# Patient Record
Sex: Male | Born: 1935
Health system: Southern US, Community
[De-identification: ages and names within clinical notes are randomized; demographics above are authoritative.]

## PROBLEM LIST (undated history)

## (undated) DIAGNOSIS — E119 Type 2 diabetes mellitus without complications: Secondary | ICD-10-CM

## (undated) DIAGNOSIS — I10 Essential (primary) hypertension: Secondary | ICD-10-CM

## (undated) DIAGNOSIS — N183 Chronic kidney disease, stage 3 unspecified: Secondary | ICD-10-CM

## (undated) DIAGNOSIS — R932 Abnormal findings on diagnostic imaging of liver and biliary tract: Secondary | ICD-10-CM

## (undated) DIAGNOSIS — I4891 Unspecified atrial fibrillation: Secondary | ICD-10-CM

## (undated) DIAGNOSIS — M8580 Other specified disorders of bone density and structure, unspecified site: Secondary | ICD-10-CM

## (undated) DIAGNOSIS — Z5189 Encounter for other specified aftercare: Secondary | ICD-10-CM

## (undated) DIAGNOSIS — I482 Chronic atrial fibrillation, unspecified: Secondary | ICD-10-CM

## (undated) DIAGNOSIS — K626 Ulcer of anus and rectum: Secondary | ICD-10-CM

## (undated) DIAGNOSIS — D649 Anemia, unspecified: Secondary | ICD-10-CM

## (undated) DIAGNOSIS — E785 Hyperlipidemia, unspecified: Secondary | ICD-10-CM

## (undated) DIAGNOSIS — K5909 Other constipation: Secondary | ICD-10-CM

## (undated) DIAGNOSIS — M109 Gout, unspecified: Secondary | ICD-10-CM

## (undated) DIAGNOSIS — I351 Nonrheumatic aortic (valve) insufficiency: Secondary | ICD-10-CM

## (undated) DIAGNOSIS — I5032 Chronic diastolic (congestive) heart failure: Secondary | ICD-10-CM

## (undated) DIAGNOSIS — N182 Chronic kidney disease, stage 2 (mild): Secondary | ICD-10-CM

## (undated) DIAGNOSIS — I872 Venous insufficiency (chronic) (peripheral): Secondary | ICD-10-CM

## (undated) DIAGNOSIS — E538 Deficiency of other specified B group vitamins: Secondary | ICD-10-CM

## (undated) DIAGNOSIS — I83893 Varicose veins of bilateral lower extremities with other complications: Secondary | ICD-10-CM

## (undated) HISTORY — DX: Other constipation: K59.09

## (undated) HISTORY — DX: Anemia, unspecified: D64.9

## (undated) HISTORY — DX: Encounter for other specified aftercare: Z51.89

## (undated) HISTORY — DX: Venous insufficiency (chronic) (peripheral): I87.2

## (undated) HISTORY — DX: Gout, unspecified: M10.9

## (undated) HISTORY — DX: Other specified disorders of bone density and structure, unspecified site: M85.80

## (undated) HISTORY — DX: Ulcer of anus and rectum: K62.6

## (undated) HISTORY — DX: Essential (primary) hypertension: I10

## (undated) HISTORY — DX: Type 2 diabetes mellitus without complications: E11.9

## (undated) HISTORY — DX: Deficiency of other specified B group vitamins: E53.8

## (undated) HISTORY — DX: Hyperlipidemia, unspecified: E78.5

## (undated) HISTORY — DX: Varicose veins of bilateral lower extremities with other complications: I83.893

## (undated) HISTORY — DX: Chronic kidney disease, stage 2 (mild): N18.2

## (undated) HISTORY — DX: Unspecified atrial fibrillation: I48.91

---
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
## 1968-01-03 HISTORY — PX: HEMORRHOID SURGERY: SHX153

## 2003-12-29 ENCOUNTER — Ambulatory Visit: Payer: Self-pay | Admitting: Family Medicine

## 2004-01-03 HISTORY — PX: BUNIONECTOMY: SHX129

## 2004-02-02 ENCOUNTER — Ambulatory Visit: Payer: Self-pay | Admitting: Family Medicine

## 2004-04-20 ENCOUNTER — Ambulatory Visit: Payer: Self-pay | Admitting: Family Medicine

## 2004-05-20 ENCOUNTER — Ambulatory Visit: Payer: Self-pay | Admitting: Family Medicine

## 2004-10-05 ENCOUNTER — Ambulatory Visit: Payer: Self-pay | Admitting: Family Medicine

## 2004-10-17 ENCOUNTER — Ambulatory Visit: Payer: Self-pay | Admitting: Family Medicine

## 2004-12-14 ENCOUNTER — Ambulatory Visit: Payer: Self-pay | Admitting: Family Medicine

## 2005-01-16 ENCOUNTER — Ambulatory Visit: Payer: Self-pay | Admitting: Family Medicine

## 2005-02-13 ENCOUNTER — Ambulatory Visit: Payer: Self-pay | Admitting: Family Medicine

## 2005-03-17 ENCOUNTER — Ambulatory Visit: Payer: Self-pay | Admitting: Family Medicine

## 2005-06-07 ENCOUNTER — Ambulatory Visit: Payer: Self-pay | Admitting: Family Medicine

## 2005-07-10 ENCOUNTER — Ambulatory Visit: Payer: Self-pay | Admitting: Family Medicine

## 2006-02-06 ENCOUNTER — Ambulatory Visit: Payer: Self-pay | Admitting: Family Medicine

## 2006-02-06 LAB — CONVERTED CEMR LAB
Basophils Absolute: 0 10*3/uL (ref 0.0–0.1)
Basophils Relative: 0.8 % (ref 0.0–1.0)
Cholesterol: 121 mg/dL (ref 0–200)
Creatinine, Ser: 1.2 mg/dL (ref 0.4–1.5)
Creatinine,U: 303.5 mg/dL
Eosinophils Relative: 2.8 % (ref 0.0–5.0)
Glucose, Bld: 106 mg/dL — ABNORMAL HIGH (ref 70–99)
HCT: 32.2 % — ABNORMAL LOW (ref 39.0–52.0)
HDL: 32.6 mg/dL — ABNORMAL LOW (ref >39.0)
Hemoglobin: 10.6 g/dL — ABNORMAL LOW (ref 13.0–17.0)
LDL Cholesterol: 67 mg/dL (ref 0–99)
Lymphocytes Relative: 22.2 % (ref 12.0–46.0)
Microalb Creat Ratio: 2.3 mg/g (ref 0.0–30.0)
Microalb, Ur: 0.7 mg/dL (ref 0.0–1.9)
Monocytes Absolute: 0.7 10*3/uL (ref 0.2–0.7)
Neutro Abs: 3.8 10*3/uL (ref 1.4–7.7)
Neutrophils Relative %: 63.3 % (ref 43.0–77.0)
PSA: 2.34 ng/mL (ref 0.10–4.00)
Potassium: 4.6 meq/L (ref 3.5–5.1)
RDW: 13.8 % (ref 11.5–14.6)
VLDL: 21 mg/dL (ref 0–40)

## 2006-03-20 ENCOUNTER — Ambulatory Visit: Payer: Self-pay | Admitting: Family Medicine

## 2006-05-14 ENCOUNTER — Ambulatory Visit: Payer: Self-pay | Admitting: Family Medicine

## 2006-05-14 LAB — CONVERTED CEMR LAB
Basophils Absolute: 0 10*3/uL (ref 0.0–0.1)
Eosinophils Absolute: 0.1 10*3/uL (ref 0.0–0.6)
Hgb A1c MFr Bld: 6.3 % — ABNORMAL HIGH (ref 4.6–6.0)
MCHC: 34.3 g/dL (ref 30.0–36.0)
MCV: 81.4 fL (ref 78.0–100.0)
Monocytes Absolute: 0.6 10*3/uL (ref 0.2–0.7)
Monocytes Relative: 10.4 % (ref 3.0–11.0)
RBC: 5.09 M/uL (ref 4.22–5.81)
RDW: 17.7 % — ABNORMAL HIGH (ref 11.5–14.6)

## 2006-08-13 DIAGNOSIS — E785 Hyperlipidemia, unspecified: Secondary | ICD-10-CM | POA: Insufficient documentation

## 2006-08-13 DIAGNOSIS — I482 Chronic atrial fibrillation, unspecified: Secondary | ICD-10-CM | POA: Insufficient documentation

## 2006-08-13 DIAGNOSIS — E8881 Metabolic syndrome: Secondary | ICD-10-CM | POA: Insufficient documentation

## 2006-08-13 DIAGNOSIS — M109 Gout, unspecified: Secondary | ICD-10-CM | POA: Insufficient documentation

## 2006-08-13 DIAGNOSIS — I4891 Unspecified atrial fibrillation: Secondary | ICD-10-CM

## 2006-08-14 ENCOUNTER — Ambulatory Visit: Payer: Self-pay | Admitting: Family Medicine

## 2006-08-14 LAB — CONVERTED CEMR LAB
INR: 1.9
Prothrombin Time: 17 s

## 2006-09-25 ENCOUNTER — Ambulatory Visit: Payer: Self-pay | Admitting: Family Medicine

## 2007-01-30 ENCOUNTER — Telehealth: Payer: Self-pay | Admitting: Family Medicine

## 2007-11-12 DIAGNOSIS — R221 Localized swelling, mass and lump, neck: Secondary | ICD-10-CM | POA: Insufficient documentation

## 2007-11-12 DIAGNOSIS — R220 Localized swelling, mass and lump, head: Secondary | ICD-10-CM | POA: Insufficient documentation

## 2008-01-16 DIAGNOSIS — T753XXA Motion sickness, initial encounter: Secondary | ICD-10-CM | POA: Insufficient documentation

## 2008-02-19 DIAGNOSIS — K649 Unspecified hemorrhoids: Secondary | ICD-10-CM | POA: Insufficient documentation

## 2010-05-20 NOTE — Assessment & Plan Note (Signed)
[facility]                                 ON-CALL NOTE   JAYCUB, NOORANI                      MRN:          [id number]  DATE:02/17/2006                            DOB:          September 26, 1931    PHONE:  [phone].   SUBJECTIVE:  Exposure to flu.  A child stayed with them last night and  then saw a doctor this morning and was diagnosed with the flu.  He is  requesting Tamiflu prophylaxis.   ASSESSMENT AND PLAN:  Tamiflu called in 75 mg 1 tablet p.o. b.i.d. x10  to [facility] in [place] for husband and wife.     Kerby Nora, MD  Electronically Signed    AB/MedQ  DD: 02/18/2006  DT: 02/18/2006  Job #: [id number]

## 2013-08-26 ENCOUNTER — Ambulatory Visit (INDEPENDENT_AMBULATORY_CARE_PROVIDER_SITE_OTHER): Payer: Medicare Other | Admitting: Family Medicine

## 2013-08-26 ENCOUNTER — Encounter: Payer: Self-pay | Admitting: Family Medicine

## 2013-08-26 VITALS — BP 114/75 | HR 74 | Temp 98.2°F | Resp 18 | Ht 71.0 in | Wt 260.0 lb

## 2013-08-26 DIAGNOSIS — I4891 Unspecified atrial fibrillation: Secondary | ICD-10-CM

## 2013-08-26 DIAGNOSIS — M109 Gout, unspecified: Secondary | ICD-10-CM

## 2013-08-26 DIAGNOSIS — R7303 Prediabetes: Secondary | ICD-10-CM | POA: Insufficient documentation

## 2013-08-26 DIAGNOSIS — D500 Iron deficiency anemia secondary to blood loss (chronic): Secondary | ICD-10-CM

## 2013-08-26 DIAGNOSIS — E119 Type 2 diabetes mellitus without complications: Secondary | ICD-10-CM

## 2013-08-26 DIAGNOSIS — I482 Chronic atrial fibrillation, unspecified: Secondary | ICD-10-CM

## 2013-08-26 DIAGNOSIS — D649 Anemia, unspecified: Secondary | ICD-10-CM | POA: Insufficient documentation

## 2013-08-26 DIAGNOSIS — E785 Hyperlipidemia, unspecified: Secondary | ICD-10-CM

## 2013-08-26 NOTE — Assessment & Plan Note (Signed)
He is on minimal dose of metformin. Continue this.

## 2013-08-26 NOTE — Assessment & Plan Note (Signed)
Per pt's report, this has been quiescent on allopurinol for quite some time.

## 2013-08-26 NOTE — Assessment & Plan Note (Signed)
Stable with rate control and anticoag. We'll continue the q6 wk PT/INR monitoring he has been used to, with the next one being due in about 5 wks.

## 2013-08-26 NOTE — Progress Notes (Signed)
Office Note 08/26/2013  CC:  Chief Complaint  Patient presents with  . Establish Care    HPI:  [name] is a 80 y.o. White male who is here to establish care. Patient's most recent primary MD: Dr. Hermenia Bers in [place]. Old records in EPIC/HL EMR were reviewed prior to or during today's visit.  He has DM, hx of good control, does not monitor home CBGs.  Has been getting q3 mo lab checks/office visits with PMD. Has been getting INRs q6 wks.  After mowing lawn and doing other yard work for 4-5 hours recently he had onset of some painful varicose veins in back of left thigh.  This has gradually been getting better.  No new leg swelling.  He has mild chronic venous insufficiency edema. No fever or malaise.  Past Medical History  Diagnosis Date  . Anemia   . Diabetes mellitus without complication     hx of good control  . Blood transfusion without reported diagnosis   . Hyperlipidemia   . Hypertension   . Gout   . Vitamin B12 deficiency   . Atrial fibrillation   . GI bleed     slow/occult: pan-endoscopy + capsule endoscopy showed no site of bleeding.  Takes one iron tab a day and Hb has been stable.  . Chronic constipation     colace helps  . Chronic venous insufficiency     Past Surgical History  Procedure Laterality Date  . Tonsillectomy and adenoidectomy  1945  . Hemorrhoid surgery  1970  . Bunionectomy  2006    Family History  Problem Relation Age of Onset  . Alcohol abuse Father   . Diabetes Father   . Obesity Brother     History   Social History  . Marital Status: Married    Spouse Name: N/A    Number of Children: N/A  . Years of Education: N/A   Occupational History  . Not on file.   Social History Main Topics  . Smoking status: Former Smoker    Types: Cigarettes  . Smokeless tobacco: Never Used  . Alcohol Use: No  . Drug Use: No  . Sexual Activity: Not on file   Other Topics Concern  . Not on file   Social History Narrative   Married, 3  daughters, 4 grandchildren.      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   Retired 2012.      Remote smoking hx: quit 55 yrs ago.  No alcohol in 55 yrs.          Outpatient Encounter Prescriptions as of 08/26/2013  Medication Sig  . allopurinol (ZYLOPRIM) 300 MG tablet Take 300 mg by mouth daily.  . cyanocobalamin (,VITAMIN B-12,) 1000 MCG/ML injection Inject 1,000 mcg into the muscle every 30 (thirty) days.  Mariane Baumgarten Sodium 100 MG capsule   . ferrous sulfate 325 (65 FE) MG tablet Take 325 mg by mouth daily with breakfast.  . lisinopril (PRINIVIL,ZESTRIL) 2.5 MG tablet Take 2.5 mg by mouth daily.  . metFORMIN (GLUCOPHAGE) 500 MG tablet Take 500 mg by mouth daily.  . metoprolol succinate (TOPROL-XL) 50 MG 24 hr tablet Take 50 mg by mouth daily.  . simvastatin (ZOCOR) 20 MG tablet Take 20 mg by mouth daily.  Marland Kitchen warfarin (COUMADIN) 5 MG tablet Take 5 mg by mouth daily.    No Active Allergies  ROS Review of Systems  Constitutional: Negative for fever and  fatigue.  HENT: Negative for congestion and sore throat.   Eyes: Negative for visual disturbance.  Respiratory: Negative for cough.   Cardiovascular: Negative for chest pain.  Gastrointestinal: Negative for nausea and abdominal pain.  Genitourinary: Negative for dysuria.  Musculoskeletal: Negative for back pain and joint swelling.  Skin: Negative for rash.  Neurological: Negative for weakness and headaches.  Hematological: Negative for adenopathy.    PE; Blood pressure 114/75, pulse 74, temperature 98.2 F (36.8 C), temperature source Temporal, resp. rate 18, height 5\' 11"  (1.803 m), weight 260 lb (117.935 kg), SpO2 97.00%. Gen: Alert, well appearing.  Patient is oriented to person, place, time, and situation. CV: Regular but with occ pause, also occasional premature beat. Chest is clear, no wheezing or rales. Normal symmetric air entry throughout both lung fields.  No chest wall deformities or tenderness. EXT: left thigh with some palpable, mildly tender but non-inflamed varicosities over distal hamstring region.  No erythema.  No knee swelling.  He has 2+ pitting edema in both LL's.  Pertinent labs:  None today  ASSESSMENT AND PLAN:   New pt: obtain old records.  ATRIAL FIBRILLATION Stable with rate control and anticoag. We'll continue the q6 wk PT/INR monitoring he has been used to, with the next one being due in about 5 wks.    HYPERLIPIDEMIA Tolerating statin. At next f/u will check FLP, CMET.  GOUT Per pt's report, this has been quiescent on allopurinol for quite some time.  Type II or unspecified type diabetes mellitus without mention of complication, not stated as uncontrolled He is on minimal dose of metformin. Continue this.  Iron deficiency anemia secondary to blood loss (chronic) No site of blood loss could be identified on testing. Currently Hb stable on one iron tab a day per pt report. Will verify with old records.  Plan is to recheck labs in 36mo at next f/u, earlier if he begins to feel lightheaded/orthostatic sx's, which he says was his first hint at low blood count in the past.   An After Visit Summary was printed and given to the patient.  Return in about 6 months (around 02/26/2014) for routine chronic illness f/u with fasting labs the week prior.  Also, needs PT/INR in 5 wks (lab visi.

## 2013-08-26 NOTE — Assessment & Plan Note (Signed)
Tolerating statin. At next f/u will check FLP, CMET.

## 2013-08-26 NOTE — Progress Notes (Signed)
Pre visit review using our clinic review tool, if applicable. No additional management support is needed unless otherwise documented below in the visit note. 

## 2013-08-26 NOTE — Assessment & Plan Note (Signed)
No site of blood loss could be identified on testing. Currently Hb stable on one iron tab a day per pt report. Will verify with old records.  Plan is to recheck labs in 51mo at next f/u, earlier if he begins to feel lightheaded/orthostatic sx's, which he says was his first hint at low blood count in the past.

## 2013-09-02 ENCOUNTER — Encounter: Payer: Self-pay | Admitting: Family Medicine

## 2013-09-02 ENCOUNTER — Ambulatory Visit (INDEPENDENT_AMBULATORY_CARE_PROVIDER_SITE_OTHER): Payer: Medicare Other | Admitting: Family Medicine

## 2013-09-02 VITALS — BP 117/75 | HR 85 | Temp 97.8°F | Resp 16 | Ht 71.0 in | Wt 259.0 lb

## 2013-09-02 DIAGNOSIS — Z5181 Encounter for therapeutic drug level monitoring: Secondary | ICD-10-CM

## 2013-09-02 DIAGNOSIS — Z7901 Long term (current) use of anticoagulants: Secondary | ICD-10-CM

## 2013-09-02 DIAGNOSIS — I83893 Varicose veins of bilateral lower extremities with other complications: Secondary | ICD-10-CM

## 2013-09-02 MED ORDER — LISINOPRIL 2.5 MG PO TABS: 2.5000 mg | ORAL_TABLET | Freq: Every day | ORAL | Status: DC

## 2013-09-02 NOTE — Patient Instructions (Signed)
Apply ice to back of left thigh for 20-30 minutes twice per day. Do your usual daily activities but no exertion or exercise for 2 weeks.

## 2013-09-02 NOTE — Progress Notes (Signed)
Pre visit review using our clinic review tool, if applicable. No additional management support is needed unless otherwise documented below in the visit note. 

## 2013-09-02 NOTE — Progress Notes (Signed)
OFFICE NOTE  09/02/2013  CC:  Chief Complaint  Patient presents with  . Leg Pain    Left leg   . Varicose Veins    back of left leg   HPI: Patient is a 80 y.o. Caucasian male who is here for pain in back of left leg.   Hx of painful varicosity/superficial phlebitis on back of distal portion of upper leg on left (see my note from 08/26/13). Recently the area stopped being painful and a large bruise developed in the area and has drifted down with gravity. No pain now but fleeting mild pain seems to come and go with different positions of his leg.  Has been applying ice daily.  Has hx of good PT/INR levels per his report, no signif need for dose changes in recent past.  Pertinent PMH:  Past medical, surgical, social, and family history reviewed and no changes are noted since last office visit.  MEDS:  Outpatient Prescriptions Prior to Visit  Medication Sig Dispense Refill  . allopurinol (ZYLOPRIM) 300 MG tablet Take 300 mg by mouth daily.      . cyanocobalamin (,VITAMIN B-12,) 1000 MCG/ML injection Inject 1,000 mcg into the muscle every 30 (thirty) days.      Mariane Baumgarten Sodium 100 MG capsule       . ferrous sulfate 325 (65 FE) MG tablet Take 325 mg by mouth daily with breakfast.      . metFORMIN (GLUCOPHAGE) 500 MG tablet Take 500 mg by mouth daily.      . metoprolol succinate (TOPROL-XL) 50 MG 24 hr tablet Take 50 mg by mouth daily.      . simvastatin (ZOCOR) 20 MG tablet Take 20 mg by mouth daily.      Marland Kitchen warfarin (COUMADIN) 5 MG tablet Take 5 mg by mouth daily.      Marland Kitchen lisinopril (PRINIVIL,ZESTRIL) 2.5 MG tablet Take 2.5 mg by mouth daily.       No facility-administered medications prior to visit.    PE: Blood pressure 117/75, pulse 85, temperature 97.8 F (36.6 C), temperature source Temporal, resp. rate 16, height 5\' 11"  (1.803 m), weight 259 lb (117.482 kg), SpO2 96.00%. Gen: Alert, well appearing.  Patient is oriented to person, place, time, and situation. Both legs:  diffuse, scattered non-inflamed varicose veins.  Back of left thigh with large ecchymoses distally and behind left knee, with mild tenderness centrally, mild warmth centrall, mild erythema centrally.  No mass or cord palpable.  No streaky redness. He has some scattered patches of ecchymoses in LL and ankle on left side from the bruise settling lately with the effect of gravity.  Very mild pitting edema bilat in LL's, unchanged from prior exam.     IMPRESSION AND PLAN:  Varicose veins with complications.   I think he had a recent inflamed varicose vein that spontaneously ruptured.  I see no evidence of deep tissue hematoma. He is on coumadin, so this type of complication from his varicose veins could be a recurrent problem. He is agreeable to referral to vein/laser clinic for further eval to see if there is anything they can offer him. For now, continue to monitor the region for gradual improvement, continue ice for 20-30 min twice a day. Signs/symptoms to call or return for were reviewed and pt expressed understanding.  For his coumadin management, we'll attempt to keep him as close to INR of 2 as possible.  An After Visit Summary was printed and given to the patient.  FOLLOW  UP: keep appt already scheduled for 09/30/13.

## 2013-09-07 ENCOUNTER — Emergency Department (HOSPITAL_BASED_OUTPATIENT_CLINIC_OR_DEPARTMENT_OTHER)
Admission: EM | Admit: 2013-09-07 | Discharge: 2013-09-07 | Disposition: A | Discharge status: Home / Self Care | Payer: Medicare Other | Attending: Emergency Medicine | Admitting: Emergency Medicine

## 2013-09-07 ENCOUNTER — Encounter (HOSPITAL_BASED_OUTPATIENT_CLINIC_OR_DEPARTMENT_OTHER): Payer: Self-pay | Admitting: Emergency Medicine

## 2013-09-07 ENCOUNTER — Emergency Department (HOSPITAL_BASED_OUTPATIENT_CLINIC_OR_DEPARTMENT_OTHER): Payer: Medicare Other

## 2013-09-07 DIAGNOSIS — E538 Deficiency of other specified B group vitamins: Secondary | ICD-10-CM | POA: Diagnosis not present

## 2013-09-07 DIAGNOSIS — Z7901 Long term (current) use of anticoagulants: Secondary | ICD-10-CM | POA: Diagnosis not present

## 2013-09-07 DIAGNOSIS — I4891 Unspecified atrial fibrillation: Secondary | ICD-10-CM | POA: Diagnosis not present

## 2013-09-07 DIAGNOSIS — Z79899 Other long term (current) drug therapy: Secondary | ICD-10-CM | POA: Diagnosis not present

## 2013-09-07 DIAGNOSIS — M109 Gout, unspecified: Secondary | ICD-10-CM | POA: Insufficient documentation

## 2013-09-07 DIAGNOSIS — M7981 Nontraumatic hematoma of soft tissue: Secondary | ICD-10-CM | POA: Diagnosis not present

## 2013-09-07 DIAGNOSIS — Z87891 Personal history of nicotine dependence: Secondary | ICD-10-CM | POA: Diagnosis not present

## 2013-09-07 DIAGNOSIS — E119 Type 2 diabetes mellitus without complications: Secondary | ICD-10-CM | POA: Diagnosis not present

## 2013-09-07 DIAGNOSIS — T148XXA Other injury of unspecified body region, initial encounter: Secondary | ICD-10-CM

## 2013-09-07 DIAGNOSIS — N182 Chronic kidney disease, stage 2 (mild): Secondary | ICD-10-CM | POA: Diagnosis not present

## 2013-09-07 DIAGNOSIS — K5900 Constipation, unspecified: Secondary | ICD-10-CM | POA: Diagnosis not present

## 2013-09-07 DIAGNOSIS — I129 Hypertensive chronic kidney disease with stage 1 through stage 4 chronic kidney disease, or unspecified chronic kidney disease: Secondary | ICD-10-CM | POA: Insufficient documentation

## 2013-09-07 DIAGNOSIS — M7989 Other specified soft tissue disorders: Secondary | ICD-10-CM | POA: Diagnosis present

## 2013-09-07 DIAGNOSIS — E785 Hyperlipidemia, unspecified: Secondary | ICD-10-CM | POA: Insufficient documentation

## 2013-09-07 DIAGNOSIS — D649 Anemia, unspecified: Secondary | ICD-10-CM | POA: Insufficient documentation

## 2013-09-07 LAB — CBC WITH DIFFERENTIAL/PLATELET
Basophils Absolute: 0.1 10*3/uL (ref 0.0–0.1)
Basophils Relative: 1 % (ref 0–1)
EOS ABS: 0.2 10*3/uL (ref 0.0–0.7)
EOS PCT: 2 % (ref 0–5)
HEMATOCRIT: 39.3 % (ref 39.0–52.0)
HEMOGLOBIN: 12.7 g/dL — AB (ref 13.0–17.0)
LYMPHS ABS: 1.3 10*3/uL (ref 0.7–4.0)
LYMPHS PCT: 17 % (ref 12–46)
MCH: 29.1 pg (ref 26.0–34.0)
MCHC: 32.3 g/dL (ref 30.0–36.0)
MCV: 90.1 fL (ref 78.0–100.0)
MONO ABS: 0.7 10*3/uL (ref 0.1–1.0)
MONOS PCT: 9 % (ref 3–12)
Neutro Abs: 5.5 10*3/uL (ref 1.7–7.7)
Neutrophils Relative %: 71 % (ref 43–77)
Platelets: 319 10*3/uL (ref 150–400)
RBC: 4.36 MIL/uL (ref 4.22–5.81)
RDW: 15.3 % (ref 11.5–15.5)
WBC: 7.7 10*3/uL (ref 4.0–10.5)

## 2013-09-07 LAB — BASIC METABOLIC PANEL
Anion gap: 10 (ref 5–15)
BUN: 15 mg/dL (ref 6–23)
CALCIUM: 9.2 mg/dL (ref 8.4–10.5)
CO2: 27 meq/L (ref 19–32)
Chloride: 104 mEq/L (ref 96–112)
Creatinine, Ser: 1.2 mg/dL (ref 0.50–1.35)
GFR calc Af Amer: 64 mL/min — ABNORMAL LOW (ref >90)
GFR, EST NON AFRICAN AMERICAN: 55 mL/min — AB (ref >90)
GLUCOSE: 82 mg/dL (ref 70–99)
Potassium: 4.4 mEq/L (ref 3.7–5.3)
Sodium: 141 mEq/L (ref 137–147)

## 2013-09-07 LAB — PROTIME-INR
INR: 2.9 — ABNORMAL HIGH (ref 0.00–1.49)
Prothrombin Time: 30.3 seconds — ABNORMAL HIGH (ref 11.6–15.2)

## 2013-09-07 LAB — APTT: aPTT: 55 seconds — ABNORMAL HIGH (ref 24–37)

## 2013-09-07 MED ORDER — HYDROCODONE-ACETAMINOPHEN 5-325 MG PO TABS: ORAL_TABLET | ORAL | Status: DC

## 2013-09-07 MED ORDER — MEDICAL COMPRESSION STOCKINGS MISC: Status: DC

## 2013-09-07 NOTE — ED Notes (Signed)
Reports leg swelling, seen PMD told to put ice on leg. Bruising to left upper leg. PMD recommended appt with [facility] on 9/16.

## 2013-09-07 NOTE — Discharge Instructions (Signed)
Take vicodin for breakthrough pain, do not drink alcohol, drive, care for children or do other critical tasks while taking vicodin.  Please be very careful not to fall! The pain medication and leg pain puts you at risk for falls. Please rest as much as possible and try to not stay alone.   Elevate and compress the affected leg.  Please follow with your primary care doctor in the next 2 days for a check-up. They must obtain records for further management.   Do not hesitate to return to the Emergency Department for any new, worsening or concerning symptoms.    Hematoma A hematoma is a collection of blood under the skin, in an organ, in a body space, in a joint space, or in other tissue. The blood can clot to form a lump that you can see and feel. The lump is often firm and may sometimes become sore and tender. Most hematomas get better in a few days to weeks. However, some hematomas may be serious and require medical care. Hematomas can range in size from very small to very large. CAUSES  A hematoma can be caused by a blunt or penetrating injury. It can also be caused by spontaneous leakage from a blood vessel under the skin. Spontaneous leakage from a blood vessel is more likely to occur in older people, especially those taking blood thinners. Sometimes, a hematoma can develop after certain medical procedures. SIGNS AND SYMPTOMS   A firm lump on the body.  Possible pain and tenderness in the area.  Bruising.Blue, dark blue, purple-red, or yellowish skin may appear at the site of the hematoma if the hematoma is close to the surface of the skin. For hematomas in deeper tissues or body spaces, the signs and symptoms may be subtle. For example, an intra-abdominal hematoma may cause abdominal pain, weakness, fainting, and shortness of breath. An intracranial hematoma may cause a headache or symptoms such as weakness, trouble speaking, or a change in consciousness. DIAGNOSIS  A hematoma can  usually be diagnosed based on your medical history and a physical exam. Imaging tests may be needed if your health care provider suspects a hematoma in deeper tissues or body spaces, such as the abdomen, head, or chest. These tests may include ultrasonography or a CT scan.  TREATMENT  Hematomas usually go away on their own over time. Rarely does the blood need to be drained out of the body. Large hematomas or those that may affect vital organs will sometimes need surgical drainage or monitoring. HOME CARE INSTRUCTIONS   Apply ice to the injured area:   Put ice in a plastic bag.   Place a towel between your skin and the bag.   Leave the ice on for 20 minutes, 2-3 times a day for the first 1 to 2 days.   After the first 2 days, switch to using warm compresses on the hematoma.   Elevate the injured area to help decrease pain and swelling. Wrapping the area with an elastic bandage may also be helpful. Compression helps to reduce swelling and promotes shrinking of the hematoma. Make sure the bandage is not wrapped too tight.   If your hematoma is on a lower extremity and is painful, crutches may be helpful for a couple days.   Only take over-the-counter or prescription medicines as directed by your health care provider. SEEK IMMEDIATE MEDICAL CARE IF:   You have increasing pain, or your pain is not controlled with medicine.   You have a  fever.   You have worsening swelling or discoloration.   Your skin over the hematoma breaks or starts bleeding.   Your hematoma is in your chest or abdomen and you have weakness, shortness of breath, or a change in consciousness.  Your hematoma is on your scalp (caused by a fall or injury) and you have a worsening headache or a change in alertness or consciousness. MAKE SURE YOU:   Understand these instructions.  Will watch your condition.  Will get help right away if you are not doing well or get worse. Document Released: 08/03/2003  Document Revised: 08/21/2012 Document Reviewed: 05/29/2012 [facility] Patient Information 2015 [place], [place]. This information is not intended to replace advice given to you by your health care provider. Make sure you discuss any questions you have with your health care provider.

## 2013-09-07 NOTE — ED Notes (Signed)
Verbal order from MD [facility] for CBC, BMP, PT/INR, PTT and [place] of left leg

## 2013-09-07 NOTE — ED Provider Notes (Signed)
CSN: [id number]     Arrival date & time 09/07/13  1422 History   First MD Initiated Contact with Patient 09/07/13 1628     Chief Complaint  Patient presents with  . Leg Swelling     (Consider location/radiation/quality/duration/timing/severity/associated sxs/prior Treatment) HPI  QUIENTIN JENT is a 80 y.o. male with past medical history significant for atrial fibrillation (anticoagulated with warfarin (complaining of painful swelling to left leg. Patient had a large varicose vein 2 weeks ago which he believes first and is bruising the posterior thigh. Since that time the swelling has increased in the distal leg. Pain is severe and minimally relieved with 200 mg of Motrin which she's taken daily for the last week. Patient denies history of DVT, chest pain, shortness of breath, cough, fever, nausea, vomiting. Patient does not use compression stockings.  Past Medical History  Diagnosis Date  . Anemia   . Diabetes mellitus without complication     hx of good control  . Blood transfusion without reported diagnosis   . Hyperlipidemia   . Hypertension   . Gout   . Vitamin B12 deficiency   . Atrial fibrillation   . GI bleed     slow/occult: pan-endoscopy + capsule endoscopy showed no site of bleeding.  Takes one iron tab a day and Hb has been stable.  . Chronic constipation     colace helps  . Chronic venous insufficiency    Past Surgical History  Procedure Laterality Date  . Tonsillectomy and adenoidectomy  1945  . Hemorrhoid surgery  1970  . Bunionectomy  2006   Family History  Problem Relation Age of Onset  . Alcohol abuse Father   . Diabetes Father   . Obesity Brother    History  Substance Use Topics  . Smoking status: Former Smoker    Types: Cigarettes  . Smokeless tobacco: Never Used  . Alcohol Use: No    Review of Systems  10 systems reviewed and found to be negative, except as noted in the HPI.   Allergies  Review of patient's allergies indicates no known  allergies.  Home Medications   Prior to Admission medications   Medication Sig Start Date End Date Taking? Authorizing Provider  allopurinol (ZYLOPRIM) 300 MG tablet Take 300 mg by mouth daily. 06/17/13  Yes Historical Provider, MD  cyanocobalamin (,VITAMIN B-12,) 1000 MCG/ML injection Inject 1,000 mcg into the muscle every 30 (thirty) days. 11/15/12  Yes Historical Provider, MD  Docusate Sodium 100 MG capsule  01/23/13  Yes Historical Provider, MD  ferrous sulfate 325 (65 FE) MG tablet Take 325 mg by mouth daily with breakfast.   Yes Historical Provider, MD  lisinopril (PRINIVIL,ZESTRIL) 2.5 MG tablet Take 1 tablet (2.5 mg total) by mouth daily. 09/02/13  Yes Tammi Sou, MD  metFORMIN (GLUCOPHAGE) 500 MG tablet Take 500 mg by mouth daily. 07/02/13  Yes Historical Provider, MD  warfarin (COUMADIN) 5 MG tablet Take 5 mg by mouth daily. 07/15/13  Yes Historical Provider, MD  Elastic Bandages & Supports ([place]) MISC One compression stocking to left lower extremity. Use during the day time 09/07/13   Ephraim Hamburger, MD  HYDROcodone-acetaminophen (NORCO/VICODIN) 5-325 MG per tablet Take 1-2 tablets by mouth every 6 hours as needed for pain. 09/07/13   Jesika Men, PA-C  metoprolol succinate (TOPROL-XL) 50 MG 24 hr tablet Take 50 mg by mouth daily.    Historical Provider, MD  simvastatin (ZOCOR) 20 MG tablet Take 20 mg by mouth  daily. 09/03/12   Historical Provider, MD   BP 135/84  Pulse 84  Temp(Src) 97.8 F (36.6 C) (Oral)  Resp 16  Ht 6' (1.829 m)  Wt 201 lb (91.173 kg)  BMI 27.25 kg/m2  SpO2 99% Physical Exam  Nursing note and vitals reviewed. Constitutional: He is oriented to person, place, and time. He appears well-developed and well-nourished. No distress.  HENT:  Head: Normocephalic.  Eyes: Conjunctivae and EOM are normal.  Cardiovascular: Normal rate.   Pulmonary/Chest: Effort normal. No stridor.  Musculoskeletal: Normal range of motion.  Neurological:  He is alert and oriented to person, place, and time.  Psychiatric: He has a normal mood and affect.    ED Course  Procedures (including critical care time) Labs Review Labs Reviewed  CBC WITH DIFFERENTIAL - Abnormal; Notable for the following:    Hemoglobin 12.7 (*)    All other components within normal limits  BASIC METABOLIC PANEL - Abnormal; Notable for the following:    GFR calc non Af Amer 55 (*)    GFR calc Af Amer 64 (*)    All other components within normal limits  PROTIME-INR - Abnormal; Notable for the following:    Prothrombin Time 30.3 (*)    INR 2.90 (*)    All other components within normal limits  APTT - Abnormal; Notable for the following:    aPTT 55 (*)    All other components within normal limits    Imaging Review US Venous Img Lower Unilateral Left  09/07/2013   CLINICAL DATA:  Left leg pain after mowing the lawn and autologous. Pain is worse with movement. Currently on Coumadin. Evaluate for DVT.  EXAM: LEFT LOWER EXTREMITY VENOUS DOPPLER ULTRASOUND  TECHNIQUE: Gray-scale sonography with graded compression, as well as color Doppler and duplex ultrasound were performed to evaluate the lower extremity deep venous systems from the level of the common femoral vein and including the common femoral, femoral, profunda femoral, popliteal and calf veins including the posterior tibial, peroneal and gastrocnemius veins when visible. The superficial great saphenous vein was also interrogated. Spectral Doppler was utilized to evaluate flow at rest and with distal augmentation maneuvers in the common femoral, femoral and popliteal veins.  COMPARISON:  None.  FINDINGS: Common Femoral Vein: No evidence of thrombus. Normal compressibility, respiratory phasicity and response to augmentation.  Saphenofemoral Junction: No evidence of thrombus. Normal compressibility and flow on color Doppler imaging.  Profunda Femoral Vein: No evidence of thrombus. Normal compressibility and flow on color  Doppler imaging.  Femoral Vein: No evidence of thrombus. Normal compressibility, respiratory phasicity and response to augmentation.  Popliteal Vein: No evidence of thrombus. Normal compressibility, respiratory phasicity and response to augmentation.  Calf Veins: No evidence of thrombus. Normal compressibility and flow on color Doppler imaging.  Superficial Great Saphenous Vein: No evidence of thrombus. Normal compressibility and flow on color Doppler imaging.  Venous Reflux:  None.  Other Findings: There is a mixed echogenic approximately 6.4 x 4.8 x 4.7 cm structure involving the posterior lateral aspect of the distal thigh at the patient's palpable area of concern. No internal blood flow is demonstrated within this structure (image 41).  IMPRESSION: 1. No evidence of DVT within the left lower extremity. 2. Indeterminate approximately 6.4 cm complex mixed echogenic presumed fluid collection correlates with the patient's palpable area of concern. Given history of injury while on Coumadin, this structure is favored to represent an evolving hematoma though additional differential considerations include seroma and abscess. Clinical correlation is  advised.   Electronically Signed   By: Sandi Mariscal M.D.   On: 09/07/2013 15:48     EKG Interpretation None      MDM   Final diagnoses:  Hematoma  Chronic anticoagulation  Left leg swelling    Filed Vitals:   09/07/13 1434  BP: 135/84  Pulse: 84  Temp: 97.8 F (36.6 C)  TempSrc: Oral  Resp: 16  Height: 6' (1.829 m)  Weight: 201 lb (91.173 kg)  SpO2: 99%    MILFRED KRAMMES is a 80 y.o. male presenting with swelling pain and bruising to left leg. Patient is anticoagulated for A. fib. Significant pitting edema to left leg. Ultrasound is negative for DVT, however there is a hematoma at the area of original pain and swelling on the posterior distal thigh. Patient is not anemic and as per patient, area of hematoma is not enlarging. Case discussed with  attending physician and agrees with symptomatic control with pain medications and ice.   This is a shared visit with the attending physician who personally evaluated the patient and agrees with the care plan.   Evaluation does not show pathology that would require ongoing emergent intervention or inpatient treatment. Pt is hemodynamically stable and mentating appropriately. Discussed findings and plan with patient/guardian, who agrees with care plan. All questions answered. Return precautions discussed and outpatient follow up given.   Discharge Medication List as of 09/07/2013  6:28 PM    START taking these medications   Details  HYDROcodone-acetaminophen (NORCO/VICODIN) 5-325 MG per tablet Take 1-2 tablets by mouth every 6 hours as needed for pain., [employer], PA-C 09/09/13 0139

## 2013-09-09 ENCOUNTER — Encounter: Payer: Self-pay | Admitting: Family Medicine

## 2013-09-09 ENCOUNTER — Other Ambulatory Visit: Payer: Self-pay | Admitting: Family Medicine

## 2013-09-09 ENCOUNTER — Ambulatory Visit (INDEPENDENT_AMBULATORY_CARE_PROVIDER_SITE_OTHER): Payer: Medicare Other | Admitting: Family Medicine

## 2013-09-09 VITALS — BP 112/77 | HR 113 | Temp 98.1°F | Resp 18 | Ht 71.0 in | Wt 255.0 lb

## 2013-09-09 DIAGNOSIS — L03116 Cellulitis of left lower limb: Secondary | ICD-10-CM

## 2013-09-09 DIAGNOSIS — I83893 Varicose veins of bilateral lower extremities with other complications: Secondary | ICD-10-CM | POA: Insufficient documentation

## 2013-09-09 DIAGNOSIS — S8010XA Contusion of unspecified lower leg, initial encounter: Secondary | ICD-10-CM | POA: Insufficient documentation

## 2013-09-09 DIAGNOSIS — L02419 Cutaneous abscess of limb, unspecified: Secondary | ICD-10-CM

## 2013-09-09 DIAGNOSIS — S8012XS Contusion of left lower leg, sequela: Secondary | ICD-10-CM

## 2013-09-09 DIAGNOSIS — IMO0002 Reserved for concepts with insufficient information to code with codable children: Secondary | ICD-10-CM

## 2013-09-09 DIAGNOSIS — L03119 Cellulitis of unspecified part of limb: Secondary | ICD-10-CM

## 2013-09-09 MED ORDER — HYDROCODONE-ACETAMINOPHEN 5-325 MG PO TABS: ORAL_TABLET | ORAL | Status: DC

## 2013-09-09 MED ORDER — AMOXICILLIN-POT CLAVULANATE 500-125 MG PO TABS: ORAL_TABLET | ORAL | Status: DC

## 2013-09-09 MED ORDER — METOPROLOL SUCCINATE ER 50 MG PO TB24: 50.0000 mg | ORAL_TABLET | Freq: Every day | ORAL | Status: DC

## 2013-09-09 NOTE — Progress Notes (Signed)
Pre visit review using our clinic review tool, if applicable. No additional management support is needed unless otherwise documented below in the visit note. 

## 2013-09-09 NOTE — Patient Instructions (Signed)
Keep appt at vein doctor's office for 09/17/13.

## 2013-09-09 NOTE — Progress Notes (Signed)
OFFICE NOTE  09/09/2013  CC:  Chief Complaint  Patient presents with  . Hospitalization Follow-up   HPI: Patient is a 80 y.o. Caucasian male who is here for 2d f/u from visit to the ED for his ongoing left leg pain/swelling. I have seen him for this on 8/25 and 9/1 this year and felt like he had a ruptured varicose vein that was gradually improving. I did order referral to vein clinic when I saw him on 09/02/13. I reviewed his recent ED note, imaging, and labs.  No DVT but 6.4 cm complex fluid collection detected in area of palpable concern.  Evolving hematoma presumptive diagnosis.  Labs normal, INR in therapeutic range (2.9). He was given vicodin 5/325 rx, #15 and taking one of these helps a little.  He is still applying ice, trying to do only his routine activities, and will get fitted for compression hose later today. He has appt with vein MD 09/17/13.  Pertinent PMH:  Past medical, surgical, social, and family history reviewed and no changes are noted since last office visit.  MEDS:  Outpatient Prescriptions Prior to Visit  Medication Sig Dispense Refill  . allopurinol (ZYLOPRIM) 300 MG tablet Take 300 mg by mouth daily.      . cyanocobalamin (,VITAMIN B-12,) 1000 MCG/ML injection Inject 1,000 mcg into the muscle every 30 (thirty) days.      Mariane Baumgarten Sodium 100 MG capsule       . ferrous sulfate 325 (65 FE) MG tablet Take 325 mg by mouth daily with breakfast.      . HYDROcodone-acetaminophen (NORCO/VICODIN) 5-325 MG per tablet Take 1-2 tablets by mouth every 6 hours as needed for pain.  15 tablet  0  . lisinopril (PRINIVIL,ZESTRIL) 2.5 MG tablet Take 1 tablet (2.5 mg total) by mouth daily.  90 tablet  1  . metFORMIN (GLUCOPHAGE) 500 MG tablet Take 500 mg by mouth daily.      . metoprolol succinate (TOPROL-XL) 50 MG 24 hr tablet Take 1 tablet (50 mg total) by mouth daily.  90 tablet  1  . simvastatin (ZOCOR) 20 MG tablet Take 20 mg by mouth daily.      Marland Kitchen warfarin (COUMADIN) 5 MG  tablet Take 5 mg by mouth daily.      Water engineer Bandages & Supports (MEDICAL COMPRESSION STOCKINGS) MISC One compression stocking to left lower extremity. Use during the day time  2 each  0  . metoprolol succinate (TOPROL-XL) 50 MG 24 hr tablet Take 50 mg by mouth daily.       No facility-administered medications prior to visit.    PE: Blood pressure 112/77, pulse 113, temperature 98.1 F (36.7 C), temperature source Temporal, resp. rate 18, height 5\' 11"  (1.803 m), weight 255 lb (115.667 kg), SpO2 96.00%. Gen: Alert, well appearing.  Patient is oriented to person, place, time, and situation. AFFECT: pleasant, lucid thought and speech. Right leg with scattered varicose veins without focal swelling, trace LL pitting edema. Left leg with scattered large varicose veins, particularly posterior region of distal thigh, and this area has some focal tenderness about golf ball size but I cannot palpate any mass in subQ region.  No erythema or warmth or induration in this area. Below the knee he has a couple of areas of diffuse erythema and warmth and tenderness---these areas are not well demarcated (one on medial ankle region and the other involving most of the dorsal surface of foot).  No induration or sub Q mass. The erythema  blanches with pressure.  2+ pitting edema from left knee down into foot.  IMPRESSION AND PLAN:  Complicated varicose vein disease, recent ruptured vein suspected. He has a hematoma that was visualized on recent ultrasound in ED.  This area is actually feeling a bit improved. His areas of most concern today are on top of foot and medial ankle that I feel have either developed cellulitis or are newly inflamed varicosities.  I favor cellulitis. Plan is to start augmentin 500mg  bid x 10d for this. Continue ice, get compression stockings, and keep appt for vein MD eval 09/17/13. Take 2 vicodin for pain q6h prn.  I gave an additional rx for #40 vicodin 5/325 today.  Do not take any  NSAIDs. His INR 2 d ago was therapeutic and I see no need to recheck PT/INR today.  An After Visit Summary was printed and given to the patient.  FOLLOW UP: prn

## 2013-09-10 NOTE — ED Provider Notes (Signed)
Medical screening examination/treatment/procedure(s) were conducted as a shared visit with non-physician practitioner(s) and myself.  I personally evaluated the patient during the encounter.   EKG Interpretation None       Patient with asymmetric leg pain. Patient is on coumadin for afib, likely contributing to size of hematoma on U/S. No DVT. Neurovascularly intact. No fevers or signs/symptoms to suggest abscess. Will d/c home with pain control  Ephraim Hamburger, MD 09/10/13 [phone]

## 2013-09-30 ENCOUNTER — Other Ambulatory Visit (INDEPENDENT_AMBULATORY_CARE_PROVIDER_SITE_OTHER): Payer: Medicare Other

## 2013-09-30 DIAGNOSIS — Z7901 Long term (current) use of anticoagulants: Secondary | ICD-10-CM

## 2013-09-30 LAB — PROTIME-INR
INR: 3.3 ratio — ABNORMAL HIGH (ref 0.8–1.0)
PROTHROMBIN TIME: 35.6 s — AB (ref 9.6–13.1)

## 2013-10-14 ENCOUNTER — Other Ambulatory Visit (INDEPENDENT_AMBULATORY_CARE_PROVIDER_SITE_OTHER): Payer: Medicare Other

## 2013-10-14 ENCOUNTER — Encounter: Payer: Self-pay | Admitting: Family Medicine

## 2013-10-14 DIAGNOSIS — I482 Chronic atrial fibrillation, unspecified: Secondary | ICD-10-CM

## 2013-10-14 LAB — PROTIME-INR
INR: 1.6 ratio — AB (ref 0.8–1.0)
Prothrombin Time: 17.4 s — ABNORMAL HIGH (ref 9.6–13.1)

## 2013-10-15 ENCOUNTER — Telehealth: Payer: Self-pay | Admitting: *Deleted

## 2013-10-15 NOTE — Telephone Encounter (Signed)
Gave pt results and directions. Patient stated that he wants to start going to the coumadin clinic at [facility].

## 2013-10-15 NOTE — Telephone Encounter (Signed)
Message copied by Julieta Bellini on Wed Oct 15, 2013 10:01 AM ------      Message from: Tammi Sou      Created: Tue Oct 14, 2013  8:22 PM       Pls notify: coumadin level a little low now, INR is 1.6.      I recommend he take an extra 2.5mg  dose of coumadin tomorrow (10/14) and then continue same dosing schedule of coumadin as the last 2 weeks.  Repeat INR in 2 weeks.-thx ------

## 2013-10-15 NOTE — Telephone Encounter (Signed)
Pls call pt and tell him we'll make referral to coumadin clinic as per his request. Tell him I'm fine with this and think it is a good idea.-thx

## 2013-10-15 NOTE — Telephone Encounter (Signed)
Spoke with pt. He will come in tomorrow 10/16/13 to see [facility] for management of coumadin.

## 2013-10-15 NOTE — Telephone Encounter (Signed)
Pt would like to talk to someone in the Coumadin clinic regarding the below notes.  He isn't happy with the current regimen he's been on and feels it's not helping his levels to balance out.  He would like to come back here but wants to talk to you first.

## 2013-10-15 NOTE — Telephone Encounter (Signed)
Also, pls see what we need to do to get him into [facility] cou[facility] clinic (referral order? Just call?). Let me know.-thx

## 2013-10-16 ENCOUNTER — Ambulatory Visit (INDEPENDENT_AMBULATORY_CARE_PROVIDER_SITE_OTHER): Payer: Medicare Other | Admitting: Family

## 2013-10-16 DIAGNOSIS — I482 Chronic atrial fibrillation, unspecified: Secondary | ICD-10-CM

## 2013-10-16 LAB — POCT INR: INR: 1.8

## 2013-10-16 NOTE — Patient Instructions (Signed)
Take 7.5mg  once daily. Recheck in 1 week.   Anticoagulation Dose Instructions as of 10/16/2013     Dorene Grebe Tue Wed Thu Fri Sat   New Dose 7.5 mg 7.5 mg 7.5 mg 7.5 mg 7.5 mg 7.5 mg 7.5 mg    Description       Take 7.5mg  once daily. Recheck in 1 week.

## 2013-10-23 ENCOUNTER — Ambulatory Visit (INDEPENDENT_AMBULATORY_CARE_PROVIDER_SITE_OTHER): Payer: Medicare Other | Admitting: Family

## 2013-10-23 DIAGNOSIS — I482 Chronic atrial fibrillation, unspecified: Secondary | ICD-10-CM

## 2013-10-23 LAB — POCT INR: INR: 2.9

## 2013-10-23 NOTE — Patient Instructions (Signed)
Take 7.5mg  once daily except 5 mg on Thursdays only. Eat an extra serving of green veggies. Recheck in 3 weeks.   Anticoagulation Dose Instructions as of 10/23/2013     Dorene Grebe Tue Wed Thu Fri Sat   New Dose 7.5 mg 7.5 mg 7.5 mg 7.5 mg 5 mg 7.5 mg 7.5 mg    Description       Take 7.5mg  once daily except 5 mg on Thursdays only. Eat an extra serving of green veggies. Recheck in 3 weeks.

## 2013-11-11 ENCOUNTER — Ambulatory Visit (INDEPENDENT_AMBULATORY_CARE_PROVIDER_SITE_OTHER): Payer: Medicare Other | Admitting: Family

## 2013-11-11 DIAGNOSIS — I482 Chronic atrial fibrillation, unspecified: Secondary | ICD-10-CM

## 2013-11-11 LAB — POCT INR: INR: 2.7

## 2013-11-11 NOTE — Patient Instructions (Signed)
Take 7.5mg  once daily except 5 mg on Thursdays only. Eat an extra serving of green veggies. Recheck in 4 weeks.   Anticoagulation Dose Instructions as of 11/11/2013      [name] Tue Wed Thu Fri Sat   New Dose 7.5 mg 7.5 mg 7.5 mg 7.5 mg 5 mg 7.5 mg 7.5 mg    Description        Take 7.5mg  once daily except 5 mg on Thursdays only. Eat an extra serving of green veggies. Recheck in 4 weeks.

## 2013-12-03 ENCOUNTER — Other Ambulatory Visit: Payer: Self-pay | Admitting: Family Medicine

## 2013-12-03 MED ORDER — CYANOCOBALAMIN 1000 MCG/ML IJ SOLN: 1000.0000 ug | INTRAMUSCULAR | Status: DC

## 2013-12-09 ENCOUNTER — Ambulatory Visit (INDEPENDENT_AMBULATORY_CARE_PROVIDER_SITE_OTHER): Payer: Medicare Other | Admitting: Family

## 2013-12-09 DIAGNOSIS — I482 Chronic atrial fibrillation, unspecified: Secondary | ICD-10-CM

## 2013-12-09 LAB — POCT INR: INR: 3

## 2013-12-09 NOTE — Patient Instructions (Signed)
Eat an extra serving of green veggies. Take 7.5mg  once daily except 5 mg on Thursdays only. Recheck in 6 weeks.   Anticoagulation Dose Instructions as of 12/09/2013      Dorene Grebe Tue Wed Thu Fri Sat   New Dose 7.5 mg 7.5 mg 7.5 mg 7.5 mg 5 mg 7.5 mg 7.5 mg    Description        Eat an extra serving of green veggies. Take 7.5mg  once daily except 5 mg on Thursdays only. Recheck in 6 weeks.

## 2014-01-07 ENCOUNTER — Other Ambulatory Visit: Payer: Self-pay | Admitting: Family Medicine

## 2014-01-07 MED ORDER — METFORMIN HCL 500 MG PO TABS: 500.0000 mg | ORAL_TABLET | Freq: Every day | ORAL | Status: DC

## 2014-01-08 ENCOUNTER — Telehealth: Payer: Self-pay | Admitting: Family Medicine

## 2014-01-08 NOTE — Telephone Encounter (Signed)
Is this switch ok?

## 2014-01-08 NOTE — Telephone Encounter (Signed)
Yes, fine with me. 

## 2014-01-08 NOTE — Telephone Encounter (Signed)
Pt would like to switch to dr Retail banker. Can I sch?

## 2014-01-08 NOTE — Telephone Encounter (Signed)
No

## 2014-01-09 ENCOUNTER — Other Ambulatory Visit: Payer: Self-pay | Admitting: Family Medicine

## 2014-01-09 MED ORDER — METFORMIN HCL 500 MG PO TABS: 500.0000 mg | ORAL_TABLET | Freq: Every day | ORAL | Status: DC

## 2014-01-09 NOTE — Telephone Encounter (Signed)
Dr. Yong Channel does NOT approve.

## 2014-01-09 NOTE — Telephone Encounter (Signed)
Pt is aware.  

## 2014-01-22 ENCOUNTER — Ambulatory Visit: Payer: Medicare Other | Admitting: Family

## 2014-01-26 ENCOUNTER — Other Ambulatory Visit: Payer: Self-pay | Admitting: *Deleted

## 2014-01-26 ENCOUNTER — Other Ambulatory Visit: Payer: Self-pay | Admitting: Family

## 2014-01-26 NOTE — Telephone Encounter (Signed)
Please advise refill for warfarin? Med has never been filled by provider.

## 2014-01-27 ENCOUNTER — Ambulatory Visit (INDEPENDENT_AMBULATORY_CARE_PROVIDER_SITE_OTHER): Payer: Medicare Other | Admitting: Family

## 2014-01-27 DIAGNOSIS — I482 Chronic atrial fibrillation, unspecified: Secondary | ICD-10-CM

## 2014-01-27 LAB — POCT INR: INR: 3.8

## 2014-01-27 NOTE — Patient Instructions (Signed)
Hold coumadin today. Continue taking 7.5mg  once daily except 5 mg on Thursdays only. Recheck in 4 weeks.   Anticoagulation Dose Instructions as of 01/27/2014      [name] Tue Wed Thu Fri Sat   New Dose 7.5 mg 7.5 mg 7.5 mg 7.5 mg 5 mg 7.5 mg 7.5 mg    Description        Hold coumadin today. Continue taking 7.5mg  once daily except 5 mg on Thursdays only. Recheck in 4 weeks.

## 2014-02-19 ENCOUNTER — Other Ambulatory Visit (INDEPENDENT_AMBULATORY_CARE_PROVIDER_SITE_OTHER): Payer: Medicare Other

## 2014-02-19 DIAGNOSIS — E785 Hyperlipidemia, unspecified: Secondary | ICD-10-CM

## 2014-02-19 DIAGNOSIS — I482 Chronic atrial fibrillation, unspecified: Secondary | ICD-10-CM

## 2014-02-19 DIAGNOSIS — M109 Gout, unspecified: Secondary | ICD-10-CM

## 2014-02-19 DIAGNOSIS — E119 Type 2 diabetes mellitus without complications: Secondary | ICD-10-CM

## 2014-02-19 DIAGNOSIS — D500 Iron deficiency anemia secondary to blood loss (chronic): Secondary | ICD-10-CM | POA: Diagnosis not present

## 2014-02-19 LAB — COMPREHENSIVE METABOLIC PANEL
ALT: 15 U/L (ref 0–53)
AST: 19 U/L (ref 0–37)
Albumin: 3.6 g/dL (ref 3.5–5.2)
Alkaline Phosphatase: 57 U/L (ref 39–117)
BILIRUBIN TOTAL: 0.5 mg/dL (ref 0.2–1.2)
BUN: 17 mg/dL (ref 6–23)
CO2: 29 mEq/L (ref 19–32)
Calcium: 8.8 mg/dL (ref 8.4–10.5)
Chloride: 107 mEq/L (ref 96–112)
Creatinine, Ser: 1.1 mg/dL (ref 0.40–1.50)
GFR: 68.3 mL/min (ref >60.00)
Glucose, Bld: 106 mg/dL — ABNORMAL HIGH (ref 70–99)
Potassium: 4.5 mEq/L (ref 3.5–5.1)
Sodium: 138 mEq/L (ref 135–145)
Total Protein: 6 g/dL (ref 6.0–8.3)

## 2014-02-19 LAB — CBC WITH DIFFERENTIAL/PLATELET
BASOS ABS: 0 10*3/uL (ref 0.0–0.1)
Basophils Relative: 0.6 % (ref 0.0–3.0)
EOS PCT: 3 % (ref 0.0–5.0)
Eosinophils Absolute: 0.2 10*3/uL (ref 0.0–0.7)
HCT: 38.9 % — ABNORMAL LOW (ref 39.0–52.0)
HEMOGLOBIN: 12.7 g/dL — AB (ref 13.0–17.0)
LYMPHS PCT: 18 % (ref 12.0–46.0)
Lymphs Abs: 1 10*3/uL (ref 0.7–4.0)
MCHC: 32.7 g/dL (ref 30.0–36.0)
MCV: 87.2 fl (ref 78.0–100.0)
Monocytes Absolute: 0.6 10*3/uL (ref 0.1–1.0)
Monocytes Relative: 10.2 % (ref 3.0–12.0)
Neutro Abs: 3.7 10*3/uL (ref 1.4–7.7)
Neutrophils Relative %: 68.2 % (ref 43.0–77.0)
Platelets: 198 10*3/uL (ref 150.0–400.0)
RBC: 4.46 Mil/uL (ref 4.22–5.81)
RDW: 16.7 % — AB (ref 11.5–15.5)
WBC: 5.5 10*3/uL (ref 4.0–10.5)

## 2014-02-19 LAB — LIPID PANEL
CHOL/HDL RATIO: 3
Cholesterol: 120 mg/dL (ref 0–200)
HDL: 36.5 mg/dL — AB (ref >39.00)
LDL Cholesterol: 66 mg/dL (ref 0–99)
NonHDL: 83.5
Triglycerides: 89 mg/dL (ref 0.0–149.0)
VLDL: 17.8 mg/dL (ref 0.0–40.0)

## 2014-02-19 LAB — IBC PANEL
Iron: 50 ug/dL (ref 42–165)
Saturation Ratios: 13.6 % — ABNORMAL LOW (ref 20.0–50.0)
Transferrin: 263 mg/dL (ref 212.0–360.0)

## 2014-02-19 LAB — FERRITIN: FERRITIN: 25.7 ng/mL (ref 22.0–322.0)

## 2014-02-19 LAB — HEMOGLOBIN A1C: Hgb A1c MFr Bld: 5.9 % (ref 4.6–6.5)

## 2014-02-23 ENCOUNTER — Telehealth: Payer: Self-pay | Admitting: Family Medicine

## 2014-02-23 ENCOUNTER — Ambulatory Visit (INDEPENDENT_AMBULATORY_CARE_PROVIDER_SITE_OTHER): Payer: Medicare Other | Admitting: General Practice

## 2014-02-23 DIAGNOSIS — Z7901 Long term (current) use of anticoagulants: Secondary | ICD-10-CM

## 2014-02-23 DIAGNOSIS — I4891 Unspecified atrial fibrillation: Secondary | ICD-10-CM

## 2014-02-23 LAB — POCT INR: INR: 3

## 2014-02-23 NOTE — Telephone Encounter (Signed)
Ok in available medicare openings. But advise follow up with current PCP until new pt visit with me. Thank you.

## 2014-02-23 NOTE — Progress Notes (Signed)
Pre visit review using our clinic review tool, if applicable. No additional management support is needed unless otherwise documented below in the visit note. 

## 2014-02-23 NOTE — Telephone Encounter (Signed)
Pt came in to ask if he could transfer from [facility] location since he has his PT done here. He would like to know if you would except him as a patient.

## 2014-02-24 ENCOUNTER — Other Ambulatory Visit: Payer: Self-pay | Admitting: Family Medicine

## 2014-02-24 MED ORDER — METOPROLOL SUCCINATE ER 50 MG PO TB24: 50.0000 mg | ORAL_TABLET | Freq: Every day | ORAL | Status: DC

## 2014-02-24 MED ORDER — SIMVASTATIN 20 MG PO TABS: 20.0000 mg | ORAL_TABLET | Freq: Every day | ORAL | Status: DC

## 2014-02-25 ENCOUNTER — Other Ambulatory Visit: Payer: Self-pay | Admitting: Family Medicine

## 2014-02-25 MED ORDER — LISINOPRIL 2.5 MG PO TABS: 2.5000 mg | ORAL_TABLET | Freq: Every day | ORAL | Status: DC

## 2014-02-25 NOTE — Telephone Encounter (Signed)
Pt has been sch. Pt would like to know can dr kim accept his wife also. She is pt on dr Anitra Lauth. Can I sch ?

## 2014-02-25 NOTE — Telephone Encounter (Signed)
Ok - but again  - they need to continue with current PCP for follow up/refills/acute care until they see me to establish care. Thank you.

## 2014-02-26 ENCOUNTER — Ambulatory Visit (INDEPENDENT_AMBULATORY_CARE_PROVIDER_SITE_OTHER): Payer: Medicare Other | Admitting: Family Medicine

## 2014-02-26 ENCOUNTER — Encounter: Payer: Self-pay | Admitting: Family Medicine

## 2014-02-26 ENCOUNTER — Ambulatory Visit: Payer: Medicare Other

## 2014-02-26 VITALS — BP 108/70 | HR 70 | Temp 98.0°F | Ht 71.0 in | Wt 263.0 lb

## 2014-02-26 DIAGNOSIS — Z23 Encounter for immunization: Secondary | ICD-10-CM

## 2014-02-26 DIAGNOSIS — Z0000 Encounter for general adult medical examination without abnormal findings: Secondary | ICD-10-CM

## 2014-02-26 DIAGNOSIS — I482 Chronic atrial fibrillation, unspecified: Secondary | ICD-10-CM

## 2014-02-26 DIAGNOSIS — E785 Hyperlipidemia, unspecified: Secondary | ICD-10-CM

## 2014-02-26 DIAGNOSIS — I10 Essential (primary) hypertension: Secondary | ICD-10-CM

## 2014-02-26 DIAGNOSIS — E119 Type 2 diabetes mellitus without complications: Secondary | ICD-10-CM | POA: Diagnosis not present

## 2014-02-26 LAB — MICROALBUMIN / CREATININE URINE RATIO
Creatinine,U: 164.7 mg/dL
Microalb Creat Ratio: 0.5 mg/g (ref 0.0–30.0)
Microalb, Ur: 0.8 mg/dL (ref 0.0–1.9)

## 2014-02-26 NOTE — Progress Notes (Signed)
Pre visit review using our clinic review tool, if applicable. No additional management support is needed unless otherwise documented below in the visit note. 

## 2014-02-26 NOTE — Progress Notes (Signed)
OFFICE NOTE  02/26/2014  CC:  Chief Complaint  Patient presents with  . Follow-up     HPI: Patient is a 81 y.o. Caucasian male who is here for 6 mo f/u DM 2, HTN, a-fib, hyperlipidemia. Reviewed labs in detail today with pt: see below. Chronically very mild low Hb: taking  one iron pill a day still.  His coumadin is managed through the [facility].  Most recent INR was 02/23/14 and it was 3.0.  Does not monitor bp at home.   Denies dizziness, vision probs, HAs, or palpitations/racing heart.  He admits to not watching Na intake and does not LE swelling fluctuates a lot. The last week he has not had much swelling at all.  Left leg is the usual culprit.  No home glucose monitoring to report.  No polyuria or polydipsia.   Pertinent PMH:  Past medical, surgical, social, and family history reviewed and no changes are noted since last office visit.  MEDS:  Outpatient Prescriptions Prior to Visit  Medication Sig Dispense Refill  . allopurinol (ZYLOPRIM) 300 MG tablet Take 300 mg by mouth daily.    . cyanocobalamin (,VITAMIN B-12,) 1000 MCG/ML injection Inject 1 mL (1,000 mcg total) into the muscle every 30 (thirty) days. 1 mL 1  . Docusate Sodium 100 MG capsule     . Elastic Bandages & Supports (MEDICAL COMPRESSION STOCKINGS) MISC One compression stocking to left lower extremity. Use during the day time 2 each 0  . ferrous sulfate 325 (65 FE) MG tablet Take 325 mg by mouth daily with breakfast.    . HYDROcodone-acetaminophen (NORCO/VICODIN) 5-325 MG per tablet Take 1-2 tablets by mouth every 6 hours as needed for pain. 40 tablet 0  . lisinopril (PRINIVIL,ZESTRIL) 2.5 MG tablet Take 1 tablet (2.5 mg total) by mouth daily. 90 tablet 1  . metFORMIN (GLUCOPHAGE) 500 MG tablet Take 1 tablet (500 mg total) by mouth daily. 90 tablet 0  . metoprolol succinate (TOPROL-XL) 50 MG 24 hr tablet Take 1 tablet (50 mg total) by mouth daily. 90 tablet 1  . simvastatin (ZOCOR) 20 MG  tablet Take 1 tablet (20 mg total) by mouth daily. 90 tablet 1  . warfarin (COUMADIN) 5 MG tablet TAKE 1 AND 1/2 TABLETS DAILY (Patient taking differently: Take 7.5mg  For 5 Days And 5mg  For 2 Days) 90 tablet 1  . amoxicillin-clavulanate (AUGMENTIN) 500-125 MG per tablet 1 tab po bid x 10d (Patient not taking: Reported on 02/26/2014) 20 tablet 0   No facility-administered medications prior to visit.    PE: Blood pressure 108/70, pulse 70, temperature 98 F (36.7 C), temperature source Temporal, height 5\' 11"  (1.803 m), weight 263 lb (119.296 kg), SpO2 95 %. Gen: Alert, well appearing.  Patient is oriented to person, place, time, and situation. No further exam today.  LABS:  Lab Results  Component Value Date   HGBA1C 5.9 02/19/2014    Lab Results  Component Value Date   TSH 1.22 02/06/2006   Lab Results  Component Value Date   WBC 5.5 02/19/2014   HGB 12.7* 02/19/2014   HCT 38.9* 02/19/2014   MCV 87.2 02/19/2014   PLT 198.0 02/19/2014   Lab Results  Component Value Date   CREATININE 1.10 02/19/2014   BUN 17 02/19/2014   NA 138 02/19/2014   K 4.5 02/19/2014   CL 107 02/19/2014   CO2 29 02/19/2014   Lab Results  Component Value Date   ALT 15 02/19/2014   AST 19  02/19/2014   ALKPHOS 57 02/19/2014   BILITOT 0.5 02/19/2014   Lab Results  Component Value Date   CHOL 120 02/19/2014   Lab Results  Component Value Date   HDL 36.50* 02/19/2014   Lab Results  Component Value Date   LDLCALC 66 02/19/2014   Lab Results  Component Value Date   TRIG 89.0 02/19/2014   Lab Results  Component Value Date   CHOLHDL 3 02/19/2014   Lab Results  Component Value Date   IRON 50 02/19/2014   FERRITIN 25.7 02/19/2014     IMPRESSION AND PLAN:  1) DM 2 without complication: great control with just once daily metformin 500 mg + diet. Urine microalb/cr today. Foot exam next f/u visit.  Reminded pt of need for annual diab retpthy screening eye exam.  2) HTN; The current  medical regimen is effective;  continue present plan and medications.  3) Atrial fibrillation: The current medical regimen is effective;  continue present plan and medications.  4) Hyperlipidemia: recent LDL of 66.  Tolerating statin, transaminases normal.  Continue statin.  5) Preventative health care: prevnar 13 and Tdap given today.  An After Visit Summary was printed and given to the patient.  FOLLOW UP: 6 mo

## 2014-03-07 DIAGNOSIS — I10 Essential (primary) hypertension: Secondary | ICD-10-CM | POA: Insufficient documentation

## 2014-03-07 DIAGNOSIS — Z0000 Encounter for general adult medical examination without abnormal findings: Secondary | ICD-10-CM | POA: Insufficient documentation

## 2014-03-16 LAB — POCT INR: INR: 3.1

## 2014-03-23 ENCOUNTER — Ambulatory Visit (INDEPENDENT_AMBULATORY_CARE_PROVIDER_SITE_OTHER): Payer: Medicare Other | Admitting: General Practice

## 2014-03-23 DIAGNOSIS — Z7901 Long term (current) use of anticoagulants: Secondary | ICD-10-CM

## 2014-03-23 NOTE — Progress Notes (Signed)
Pre visit review using our clinic review tool, if applicable. No additional management support is needed unless otherwise documented below in the visit note. 

## 2014-04-02 ENCOUNTER — Other Ambulatory Visit: Payer: Self-pay | Admitting: *Deleted

## 2014-04-02 MED ORDER — CYANOCOBALAMIN 1000 MCG/ML IJ SOLN: 1000.0000 ug | INTRAMUSCULAR | Status: DC

## 2014-04-02 NOTE — Telephone Encounter (Signed)
Please advise refill for allopurinol? Rx has never been filled by provider before.

## 2014-04-03 ENCOUNTER — Other Ambulatory Visit: Payer: Self-pay | Admitting: *Deleted

## 2014-04-03 MED ORDER — ALLOPURINOL 300 MG PO TABS: 300.0000 mg | ORAL_TABLET | Freq: Every day | ORAL | Status: DC

## 2014-04-03 NOTE — Telephone Encounter (Signed)
Please advise refill for allopurinol?

## 2014-04-03 NOTE — Telephone Encounter (Signed)
Yes, ok to rx as previously prescribed, with 11 additional RF's.-thx

## 2014-04-20 ENCOUNTER — Ambulatory Visit (INDEPENDENT_AMBULATORY_CARE_PROVIDER_SITE_OTHER): Payer: Medicare Other | Admitting: General Practice

## 2014-04-20 DIAGNOSIS — Z7901 Long term (current) use of anticoagulants: Secondary | ICD-10-CM

## 2014-04-20 LAB — POCT INR: INR: 3.7

## 2014-04-20 NOTE — Progress Notes (Signed)
Pre visit review using our clinic review tool, if applicable. No additional management support is needed unless otherwise documented below in the visit note. 

## 2014-05-11 ENCOUNTER — Ambulatory Visit: Payer: Medicare Other

## 2014-05-28 ENCOUNTER — Ambulatory Visit (INDEPENDENT_AMBULATORY_CARE_PROVIDER_SITE_OTHER): Payer: Medicare Other | Admitting: General Practice

## 2014-05-28 DIAGNOSIS — I4891 Unspecified atrial fibrillation: Secondary | ICD-10-CM

## 2014-05-28 DIAGNOSIS — Z5181 Encounter for therapeutic drug level monitoring: Secondary | ICD-10-CM

## 2014-05-28 LAB — POCT INR: INR: 3.2

## 2014-05-28 NOTE — Progress Notes (Signed)
Pre visit review using our clinic review tool, if applicable. No additional management support is needed unless otherwise documented below in the visit note. 

## 2014-06-13 ENCOUNTER — Other Ambulatory Visit: Payer: Self-pay | Admitting: Family

## 2014-06-15 ENCOUNTER — Other Ambulatory Visit: Payer: Self-pay | Admitting: General Practice

## 2014-06-15 ENCOUNTER — Other Ambulatory Visit: Payer: Self-pay | Admitting: *Deleted

## 2014-06-15 MED ORDER — METOPROLOL SUCCINATE ER 50 MG PO TB24: 50.0000 mg | ORAL_TABLET | Freq: Every day | ORAL | Status: DC

## 2014-06-15 MED ORDER — WARFARIN SODIUM 5 MG PO TABS: ORAL_TABLET | ORAL | Status: DC

## 2014-06-15 NOTE — Telephone Encounter (Signed)
[facility]ax from [facility] requesting refill for Metoprolol. LOV 02/26/14, no up coming ov, last written: 02/24/14 #90 w/ 1RF. Rx sent to pharmacy.

## 2014-06-25 ENCOUNTER — Ambulatory Visit (INDEPENDENT_AMBULATORY_CARE_PROVIDER_SITE_OTHER): Payer: Medicare Other | Admitting: General Practice

## 2014-06-25 DIAGNOSIS — I4891 Unspecified atrial fibrillation: Secondary | ICD-10-CM

## 2014-06-25 DIAGNOSIS — Z5181 Encounter for therapeutic drug level monitoring: Secondary | ICD-10-CM

## 2014-06-25 DIAGNOSIS — Z7901 Long term (current) use of anticoagulants: Secondary | ICD-10-CM

## 2014-06-25 LAB — POCT INR: INR: 2.5

## 2014-06-25 NOTE — Progress Notes (Signed)
Pre visit review using our clinic review tool, if applicable. No additional management support is needed unless otherwise documented below in the visit note. 

## 2014-07-02 ENCOUNTER — Other Ambulatory Visit: Payer: Self-pay | Admitting: *Deleted

## 2014-07-02 MED ORDER — METFORMIN HCL 500 MG PO TABS: 500.0000 mg | ORAL_TABLET | Freq: Every day | ORAL | Status: DC

## 2014-07-23 ENCOUNTER — Ambulatory Visit: Payer: Medicare Other

## 2014-07-27 ENCOUNTER — Ambulatory Visit (INDEPENDENT_AMBULATORY_CARE_PROVIDER_SITE_OTHER): Payer: Medicare Other | Admitting: General Practice

## 2014-07-27 DIAGNOSIS — Z7901 Long term (current) use of anticoagulants: Secondary | ICD-10-CM

## 2014-07-27 DIAGNOSIS — Z5181 Encounter for therapeutic drug level monitoring: Secondary | ICD-10-CM | POA: Diagnosis not present

## 2014-07-27 DIAGNOSIS — I4891 Unspecified atrial fibrillation: Secondary | ICD-10-CM

## 2014-07-27 LAB — POCT INR: INR: 2.8

## 2014-07-27 NOTE — Progress Notes (Signed)
Pre visit review using our clinic review tool, if applicable. No additional management support is needed unless otherwise documented below in the visit note. 

## 2014-07-28 NOTE — Progress Notes (Signed)
Agree with recommendations.  

## 2014-08-24 ENCOUNTER — Ambulatory Visit (INDEPENDENT_AMBULATORY_CARE_PROVIDER_SITE_OTHER): Payer: Medicare Other | Admitting: General Practice

## 2014-08-24 DIAGNOSIS — Z7901 Long term (current) use of anticoagulants: Secondary | ICD-10-CM | POA: Diagnosis not present

## 2014-08-24 DIAGNOSIS — I4891 Unspecified atrial fibrillation: Secondary | ICD-10-CM

## 2014-08-24 DIAGNOSIS — Z5181 Encounter for therapeutic drug level monitoring: Secondary | ICD-10-CM | POA: Diagnosis not present

## 2014-08-24 LAB — POCT INR: INR: 2.3

## 2014-08-24 NOTE — Progress Notes (Signed)
Pre visit review using our clinic review tool, if applicable. No additional management support is needed unless otherwise documented below in the visit note. 

## 2014-08-26 ENCOUNTER — Ambulatory Visit: Payer: Medicare Other | Admitting: Family Medicine

## 2014-09-01 ENCOUNTER — Other Ambulatory Visit: Payer: Self-pay | Admitting: *Deleted

## 2014-09-01 MED ORDER — SIMVASTATIN 20 MG PO TABS: 20.0000 mg | ORAL_TABLET | Freq: Every day | ORAL | Status: DC

## 2014-09-01 NOTE — Telephone Encounter (Signed)
RF request for simvastatin LOV: 02/26/14 Next ov: 09/09/14 Last written: 02/24/14 #90 w/ 1RF

## 2014-09-09 ENCOUNTER — Encounter: Payer: Self-pay | Admitting: Family Medicine

## 2014-09-09 ENCOUNTER — Ambulatory Visit (INDEPENDENT_AMBULATORY_CARE_PROVIDER_SITE_OTHER): Payer: Medicare Other | Admitting: Family Medicine

## 2014-09-09 ENCOUNTER — Other Ambulatory Visit: Payer: Self-pay | Admitting: Family Medicine

## 2014-09-09 VITALS — BP 120/72 | HR 70 | Temp 98.4°F | Resp 16 | Ht 71.0 in | Wt 264.0 lb

## 2014-09-09 DIAGNOSIS — I10 Essential (primary) hypertension: Secondary | ICD-10-CM

## 2014-09-09 DIAGNOSIS — E119 Type 2 diabetes mellitus without complications: Secondary | ICD-10-CM | POA: Diagnosis not present

## 2014-09-09 DIAGNOSIS — D500 Iron deficiency anemia secondary to blood loss (chronic): Secondary | ICD-10-CM

## 2014-09-09 DIAGNOSIS — E785 Hyperlipidemia, unspecified: Secondary | ICD-10-CM

## 2014-09-09 DIAGNOSIS — I872 Venous insufficiency (chronic) (peripheral): Secondary | ICD-10-CM

## 2014-09-09 NOTE — Progress Notes (Addendum)
OFFICE VISIT  09/09/2014   CC:  Chief Complaint  Patient presents with  . Follow-up    Pt is not fasting.   HPI:    Patient is a 81 y.o. Caucasian male who presents for 6 mo f/u DM 2, HLD, HTN, chronic venous insufficiency. Has a-fib, chronic coumadin therapy managed by [facility]. Lab Results  Component Value Date   INR 2.3 08/24/2014   INR 2.8 07/27/2014   INR 2.5 06/25/2014   DM: takes metformin daily, says he doesn't monitor glucose at home. No burning, tingling, or numbness in feet.  No hx of foot ulcer.  No BP monitoring at home, takes med daily.  Says his stool is dark due to taking iron daily.  No sticky/tarry stool.  Has occ BRBPR from hemorrhoids. Takes his cholesterol med daily, eats whatever he wants but tries to limit quantity some.  LE swelling waxes and wanes but is present to some degree all the time.    Past Medical History  Diagnosis Date  . Anemia   . Diabetes mellitus without complication     hx of good control  . Blood transfusion without reported diagnosis   . Hyperlipidemia   . Hypertension   . Gout   . Vitamin B12 deficiency   . Atrial fibrillation   . GI bleed     slow/occult: pan-endoscopy + capsule endoscopy showed no site of bleeding.  Takes one iron tab a day and Hb has been stable.  . Chronic constipation     colace helps  . Chronic venous insufficiency   . Chronic renal insufficiency, stage II (mild)     CrCl @60 .  . Varicose veins of both lower extremities with complications     Past Surgical History  Procedure Laterality Date  . Tonsillectomy and adenoidectomy  1945  . Hemorrhoid surgery  1970  . Bunionectomy  2006    Outpatient Prescriptions Prior to Visit  Medication Sig Dispense Refill  . allopurinol (ZYLOPRIM) 300 MG tablet Take 1 tablet (300 mg total) by mouth daily. 90 tablet 3  . cyanocobalamin (,VITAMIN B-12,) 1000 MCG/ML injection Inject 1 mL (1,000 mcg total) into the muscle every 30 (thirty) days.  1 mL 1  . Docusate Sodium 100 MG capsule     . Elastic Bandages & Supports (MEDICAL COMPRESSION STOCKINGS) MISC One compression stocking to left lower extremity. Use during the day time 2 each 0  . ferrous sulfate 325 (65 FE) MG tablet Take 325 mg by mouth daily with breakfast.    . HYDROcodone-acetaminophen (NORCO/VICODIN) 5-325 MG per tablet Take 1-2 tablets by mouth every 6 hours as needed for pain. 40 tablet 0  . lisinopril (PRINIVIL,ZESTRIL) 2.5 MG tablet Take 1 tablet (2.5 mg total) by mouth daily. 90 tablet 1  . metFORMIN (GLUCOPHAGE) 500 MG tablet Take 1 tablet (500 mg total) by mouth daily. 90 tablet 0  . metoprolol succinate (TOPROL-XL) 50 MG 24 hr tablet Take 1 tablet (50 mg total) by mouth daily. 90 tablet 1  . simvastatin (ZOCOR) 20 MG tablet Take 1 tablet (20 mg total) by mouth daily. 90 tablet 1  . warfarin (COUMADIN) 5 MG tablet Take as directed by anticoagulation clinic 120 tablet 1   No facility-administered medications prior to visit.    No Known Allergies  ROS As per HPI  PE: Blood pressure 120/72, pulse 70, temperature 98.4 F (36.9 C), temperature source Oral, resp. rate 16, height 5\' 11"  (1.803 m), weight 264 lb (119.75 kg),  SpO2 97 %. Gen: Alert, well appearing.  Patient is oriented to person, place, time, and situation. EXT: 3+ pitting edema in both ankles, L foot a bit more edematous than R--as is his usual. Foot exam - no tenderness,no skin or vascular lesions. Color and temperature is normal. Sensation is intact. Peripheral pulses are palpable. Toenails are normal.   LABS:  Lab Results  Component Value Date   HGBA1C 5.9 02/19/2014    Lab Results  Component Value Date   TSH 1.22 02/06/2006   Lab Results  Component Value Date   WBC 5.5 02/19/2014   HGB 12.7* 02/19/2014   HCT 38.9* 02/19/2014   MCV 87.2 02/19/2014   PLT 198.0 02/19/2014   Lab Results  Component Value Date   CREATININE 1.10 02/19/2014   BUN 17 02/19/2014   NA 138 02/19/2014    K 4.5 02/19/2014   CL 107 02/19/2014   CO2 29 02/19/2014   Lab Results  Component Value Date   ALT 15 02/19/2014   AST 19 02/19/2014   ALKPHOS 57 02/19/2014   BILITOT 0.5 02/19/2014   Lab Results  Component Value Date   CHOL 120 02/19/2014   Lab Results  Component Value Date   HDL 36.50* 02/19/2014   Lab Results  Component Value Date   LDLCALC 66 02/19/2014   Lab Results  Component Value Date   TRIG 89.0 02/19/2014   Lab Results  Component Value Date   CHOLHDL 3 02/19/2014   IMPRESSION AND PLAN:  1) DM 2, historically well controlled, no complications. Check HbA1c today. Feet exam today normal. Reminded pt of need for annual eye exam. No change in meds today.  2) HTN: The current medical regimen is effective;  continue present plan and medications. Lytes/cr today  3) Hyperlipidemia: tolerating statin.  Lipids good 02/2014.  Repeat next f/u 6 mo. AST/ALT normal 02/2014.  4) Iron def anemia due to chronic blood loss: will do CBC monitoring today. He'll continue one iron tab daily.  5) Chronic venous insufficiency: he admits to eating too much sodium. Encouraged pt to watch sodium intake closer.  An After Visit Summary was printed and given to the patient.  FOLLOW UP: Return in about 6 months (around 03/09/2015) for routine chronic illness f/u (fasting).

## 2014-09-09 NOTE — Progress Notes (Signed)
Pre visit review using our clinic review tool, if applicable. No additional management support is needed unless otherwise documented below in the visit note. 

## 2014-09-10 LAB — CBC WITH DIFFERENTIAL/PLATELET
BASOS PCT: 0 % (ref 0–1)
Basophils Absolute: 0 10*3/uL (ref 0.0–0.1)
Eosinophils Absolute: 0.2 10*3/uL (ref 0.0–0.7)
Eosinophils Relative: 3 % (ref 0–5)
HEMATOCRIT: 40 % (ref 39.0–52.0)
HEMOGLOBIN: 13 g/dL (ref 13.0–17.0)
LYMPHS PCT: 17 % (ref 12–46)
Lymphs Abs: 1.2 10*3/uL (ref 0.7–4.0)
MCH: 29.1 pg (ref 26.0–34.0)
MCHC: 32.5 g/dL (ref 30.0–36.0)
MCV: 89.7 fL (ref 78.0–100.0)
MONO ABS: 0.7 10*3/uL (ref 0.1–1.0)
MPV: 9.6 fL (ref 8.6–12.4)
Monocytes Relative: 10 % (ref 3–12)
NEUTROS ABS: 4.8 10*3/uL (ref 1.7–7.7)
NEUTROS PCT: 70 % (ref 43–77)
Platelets: 228 10*3/uL (ref 150–400)
RBC: 4.46 MIL/uL (ref 4.22–5.81)
RDW: 14.9 % (ref 11.5–15.5)
WBC: 6.8 10*3/uL (ref 4.0–10.5)

## 2014-09-10 LAB — BASIC METABOLIC PANEL
BUN: 19 mg/dL (ref 6–23)
CHLORIDE: 107 meq/L (ref 96–112)
CO2: 26 mEq/L (ref 19–32)
Calcium: 8.6 mg/dL (ref 8.4–10.5)
Creatinine, Ser: 1.09 mg/dL (ref 0.40–1.50)
GFR: 68.93 mL/min (ref >60.00)
GLUCOSE: 79 mg/dL (ref 70–99)
POTASSIUM: 4.7 meq/L (ref 3.5–5.1)
SODIUM: 139 meq/L (ref 135–145)

## 2014-09-11 LAB — HEMOGLOBIN A1C
HEMOGLOBIN A1C: 6.3 % — AB (ref <5.7)
MEAN PLASMA GLUCOSE: 134 mg/dL — AB (ref <117)

## 2014-09-21 ENCOUNTER — Ambulatory Visit (INDEPENDENT_AMBULATORY_CARE_PROVIDER_SITE_OTHER): Payer: Medicare Other | Admitting: General Practice

## 2014-09-21 DIAGNOSIS — I4891 Unspecified atrial fibrillation: Secondary | ICD-10-CM

## 2014-09-21 DIAGNOSIS — Z7901 Long term (current) use of anticoagulants: Secondary | ICD-10-CM

## 2014-09-21 DIAGNOSIS — Z5181 Encounter for therapeutic drug level monitoring: Secondary | ICD-10-CM

## 2014-09-21 LAB — POCT INR: INR: 1.9

## 2014-09-21 NOTE — Progress Notes (Signed)
Pre visit review using our clinic review tool, if applicable. No additional management support is needed unless otherwise documented below in the visit note. 

## 2014-10-09 ENCOUNTER — Other Ambulatory Visit: Payer: Self-pay | Admitting: *Deleted

## 2014-10-09 MED ORDER — METFORMIN HCL 500 MG PO TABS: 500.0000 mg | ORAL_TABLET | Freq: Every day | ORAL | Status: DC

## 2014-10-09 NOTE — Telephone Encounter (Signed)
RF request for metformin LOV:09/09/14 Next ov: 03/10/15 Last written:07/02/14 #90 w/ 0RF

## 2014-10-14 DIAGNOSIS — E119 Type 2 diabetes mellitus without complications: Secondary | ICD-10-CM | POA: Diagnosis not present

## 2014-10-14 DIAGNOSIS — Z961 Presence of intraocular lens: Secondary | ICD-10-CM | POA: Diagnosis not present

## 2014-10-19 ENCOUNTER — Ambulatory Visit: Payer: Medicare Other

## 2014-10-19 ENCOUNTER — Ambulatory Visit (INDEPENDENT_AMBULATORY_CARE_PROVIDER_SITE_OTHER): Payer: Medicare Other | Admitting: General Practice

## 2014-10-19 DIAGNOSIS — Z5181 Encounter for therapeutic drug level monitoring: Secondary | ICD-10-CM

## 2014-10-19 LAB — POCT INR: INR: 2.3

## 2014-10-19 NOTE — Progress Notes (Signed)
Pre visit review using our clinic review tool, if applicable. No additional management support is needed unless otherwise documented below in the visit note. 

## 2014-11-09 ENCOUNTER — Other Ambulatory Visit: Payer: Self-pay | Admitting: *Deleted

## 2014-11-09 MED ORDER — LISINOPRIL 2.5 MG PO TABS: 2.5000 mg | ORAL_TABLET | Freq: Every day | ORAL | Status: DC

## 2014-11-09 NOTE — Telephone Encounter (Signed)
eRx failed. Left message on [facility] provider voice mail giving verbal order to refill as directed on Rx sent today.

## 2014-11-09 NOTE — Telephone Encounter (Signed)
RF request for lisinopirl LOV: 09/09/14 Next ov: 03/10/15 Last written: 02/25/14 #90 w/ 1RF

## 2014-11-16 ENCOUNTER — Ambulatory Visit (INDEPENDENT_AMBULATORY_CARE_PROVIDER_SITE_OTHER): Payer: Medicare Other | Admitting: General Practice

## 2014-11-16 DIAGNOSIS — Z5181 Encounter for therapeutic drug level monitoring: Secondary | ICD-10-CM | POA: Diagnosis not present

## 2014-11-16 DIAGNOSIS — Z7901 Long term (current) use of anticoagulants: Secondary | ICD-10-CM

## 2014-11-16 LAB — POCT INR: INR: 2.1

## 2014-11-16 NOTE — Progress Notes (Signed)
Pre visit review using our clinic review tool, if applicable. No additional management support is needed unless otherwise documented below in the visit note. 

## 2014-11-16 NOTE — Progress Notes (Signed)
Ok with this plan 

## 2014-12-14 ENCOUNTER — Ambulatory Visit: Payer: Medicare Other

## 2014-12-14 ENCOUNTER — Ambulatory Visit (INDEPENDENT_AMBULATORY_CARE_PROVIDER_SITE_OTHER): Payer: Medicare Other | Admitting: General Practice

## 2014-12-14 DIAGNOSIS — Z7901 Long term (current) use of anticoagulants: Secondary | ICD-10-CM | POA: Diagnosis not present

## 2014-12-14 LAB — POCT INR: INR: 2.6

## 2014-12-14 NOTE — Progress Notes (Signed)
Pre visit review using our clinic review tool, if applicable. No additional management support is needed unless otherwise documented below in the visit note. 

## 2014-12-14 NOTE — Progress Notes (Signed)
I agree with this plan.

## 2014-12-17 ENCOUNTER — Other Ambulatory Visit: Payer: Self-pay | Admitting: *Deleted

## 2014-12-17 MED ORDER — WARFARIN SODIUM 5 MG PO TABS: ORAL_TABLET | ORAL | Status: DC

## 2014-12-17 NOTE — Telephone Encounter (Signed)
RF request for warfarin LOV: 09/09/14 Next ov: 03/10/15 Last written: 06/15/14 #120 w/ 1RF

## 2015-01-11 ENCOUNTER — Ambulatory Visit: Payer: Medicare Other

## 2015-01-18 ENCOUNTER — Ambulatory Visit (INDEPENDENT_AMBULATORY_CARE_PROVIDER_SITE_OTHER): Payer: Medicare Other | Admitting: General Practice

## 2015-01-18 DIAGNOSIS — I4891 Unspecified atrial fibrillation: Secondary | ICD-10-CM | POA: Diagnosis not present

## 2015-01-18 DIAGNOSIS — Z7901 Long term (current) use of anticoagulants: Secondary | ICD-10-CM

## 2015-01-18 DIAGNOSIS — Z5181 Encounter for therapeutic drug level monitoring: Secondary | ICD-10-CM

## 2015-01-18 LAB — POCT INR: INR: 2.4

## 2015-01-18 NOTE — Progress Notes (Signed)
Pre visit review using our clinic review tool, if applicable. No additional management support is needed unless otherwise documented below in the visit note. 

## 2015-01-18 NOTE — Progress Notes (Signed)
I agree with this plan.

## 2015-02-26 ENCOUNTER — Other Ambulatory Visit: Payer: Self-pay | Admitting: *Deleted

## 2015-02-26 MED ORDER — METOPROLOL SUCCINATE ER 50 MG PO TB24: 50.0000 mg | ORAL_TABLET | Freq: Every day | ORAL | Status: DC

## 2015-02-26 MED ORDER — SIMVASTATIN 20 MG PO TABS: 20.0000 mg | ORAL_TABLET | Freq: Every day | ORAL | Status: DC

## 2015-02-26 NOTE — Telephone Encounter (Signed)
LOV: 09/19/14 NOV: 03/11/15  RF request for metoprolol Last written: 06/15/14 #90 w/ 1RF  RF request for simvastatin Last written: 09/01/14 #90 w/ 1RF

## 2015-03-01 ENCOUNTER — Ambulatory Visit (INDEPENDENT_AMBULATORY_CARE_PROVIDER_SITE_OTHER): Payer: Medicare Other | Admitting: General Practice

## 2015-03-01 DIAGNOSIS — I4891 Unspecified atrial fibrillation: Secondary | ICD-10-CM | POA: Diagnosis not present

## 2015-03-01 DIAGNOSIS — Z5181 Encounter for therapeutic drug level monitoring: Secondary | ICD-10-CM | POA: Diagnosis not present

## 2015-03-01 LAB — POCT INR: INR: 2.7

## 2015-03-01 NOTE — Progress Notes (Signed)
Pre visit review using our clinic review tool, if applicable. No additional management support is needed unless otherwise documented below in the visit note. 

## 2015-03-01 NOTE — Progress Notes (Signed)
I agree with this plan.

## 2015-03-10 ENCOUNTER — Ambulatory Visit: Payer: Medicare Other | Admitting: Family Medicine

## 2015-03-11 ENCOUNTER — Ambulatory Visit (INDEPENDENT_AMBULATORY_CARE_PROVIDER_SITE_OTHER): Payer: Medicare Other | Admitting: Family Medicine

## 2015-03-11 ENCOUNTER — Encounter: Payer: Self-pay | Admitting: Family Medicine

## 2015-03-11 VITALS — BP 137/78 | HR 58 | Temp 97.6°F | Ht 71.0 in | Wt 267.0 lb

## 2015-03-11 DIAGNOSIS — N182 Chronic kidney disease, stage 2 (mild): Secondary | ICD-10-CM

## 2015-03-11 DIAGNOSIS — E119 Type 2 diabetes mellitus without complications: Secondary | ICD-10-CM | POA: Diagnosis not present

## 2015-03-11 DIAGNOSIS — I10 Essential (primary) hypertension: Secondary | ICD-10-CM | POA: Diagnosis not present

## 2015-03-11 DIAGNOSIS — E785 Hyperlipidemia, unspecified: Secondary | ICD-10-CM | POA: Diagnosis not present

## 2015-03-11 LAB — LIPID PANEL
CHOLESTEROL: 123 mg/dL (ref 0–200)
HDL: 42.1 mg/dL (ref >39.00)
LDL CALC: 63 mg/dL (ref 0–99)
NonHDL: 80.61
TRIGLYCERIDES: 88 mg/dL (ref 0.0–149.0)
Total CHOL/HDL Ratio: 3
VLDL: 17.6 mg/dL (ref 0.0–40.0)

## 2015-03-11 LAB — BASIC METABOLIC PANEL
BUN: 13 mg/dL (ref 6–23)
CALCIUM: 8.8 mg/dL (ref 8.4–10.5)
CO2: 27 mEq/L (ref 19–32)
Chloride: 106 mEq/L (ref 96–112)
Creatinine, Ser: 1.1 mg/dL (ref 0.40–1.50)
GFR: 68.12 mL/min (ref >60.00)
GLUCOSE: 113 mg/dL — AB (ref 70–99)
Potassium: 4.3 mEq/L (ref 3.5–5.1)
Sodium: 140 mEq/L (ref 135–145)

## 2015-03-11 LAB — HEMOGLOBIN A1C: Hgb A1c MFr Bld: 6.2 % (ref 4.6–6.5)

## 2015-03-11 NOTE — Progress Notes (Signed)
OFFICE VISIT  03/11/2015   CC:  Chief Complaint  Patient presents with  . Annual Exam   HPI:    Patient is a 82 y.o. Caucasian male who presents for 6 mo f/u DM 2, HTN, HLD, CRI stage II. Feels good, is fasting. He eats whatever he wants.  Does not check glucoses. No home bp monitoring. Takes chronic meds every day.  No side effects. No formal exercise but he is busy/not sedentary.  His INRs are followed by his cardiologist.  Past Medical History  Diagnosis Date  . Anemia   . Diabetes mellitus without complication (HCC)     hx of good control  . Blood transfusion without reported diagnosis   . Hyperlipidemia   . Hypertension   . Gout   . Vitamin B12 deficiency   . Atrial fibrillation ([place])   . GI bleed     slow/occult: pan-endoscopy + capsule endoscopy showed no site of bleeding.  Takes one iron tab a day and Hb has been stable.  . Chronic constipation     colace helps  . Chronic venous insufficiency   . Chronic renal insufficiency, stage II (mild)     CrCl @60 .  . Varicose veins of both lower extremities with complications     Past Surgical History  Procedure Laterality Date  . Tonsillectomy and adenoidectomy  1945  . Hemorrhoid surgery  1970  . Bunionectomy  2006    Outpatient Prescriptions Prior to Visit  Medication Sig Dispense Refill  . allopurinol (ZYLOPRIM) 300 MG tablet Take 1 tablet (300 mg total) by mouth daily. 90 tablet 3  . Docusate Sodium 100 MG capsule     . ferrous sulfate 325 (65 FE) MG tablet Take 325 mg by mouth daily with breakfast.    . lisinopril (PRINIVIL,ZESTRIL) 2.5 MG tablet Take 1 tablet (2.5 mg total) by mouth daily. 90 tablet 3  . metFORMIN (GLUCOPHAGE) 500 MG tablet Take 1 tablet (500 mg total) by mouth daily. 90 tablet 1  . metoprolol succinate (TOPROL-XL) 50 MG 24 hr tablet Take 1 tablet (50 mg total) by mouth daily. 90 tablet 3  . simvastatin (ZOCOR) 20 MG tablet Take 1 tablet (20 mg total) by mouth daily. 90 tablet 3  .  cyanocobalamin (,VITAMIN B-12,) 1000 MCG/ML injection Inject 1 mL (1,000 mcg total) into the muscle every 30 (thirty) days. (Patient not taking: Reported on 03/11/2015) 1 mL 1  . warfarin (COUMADIN) 5 MG tablet Take as directed by anticoagulation clinic (Patient taking differently: 5 mg. Take as directed by anticoagulation clinic) 120 tablet 1  . Elastic Bandages & Supports (MEDICAL COMPRESSION STOCKINGS) MISC One compression stocking to left lower extremity. Use during the day time 2 each 0  . HYDROcodone-acetaminophen (NORCO/VICODIN) 5-325 MG per tablet Take 1-2 tablets by mouth every 6 hours as needed for pain. 40 tablet 0   No facility-administered medications prior to visit.    No Known Allergies  ROS As per HPI  PE: Blood pressure 137/78, pulse 58, temperature 97.6 F (36.4 C), temperature source Oral, height 5\' 11"  (1.803 m), weight 267 lb (121.11 kg), SpO2 98 %. Gen: Alert, well appearing.  Patient is oriented to person, place, time, and situation. CV: Irreg irreg, no m/r Chest is clear, no wheezing or rales. Normal symmetric air entry throughout both lung fields. No chest wall deformities or tenderness. EXT: 2+ pitting edema bilat  LABS:  Lab Results  Component Value Date   TSH 1.22 02/06/2006   Lab Results  Component Value Date   WBC 6.8 09/09/2014   HGB 13.0 09/09/2014   HCT 40.0 09/09/2014   MCV 89.7 09/09/2014   PLT 228 09/09/2014   Lab Results  Component Value Date   CREATININE 1.09 09/09/2014   BUN 19 09/09/2014   NA 139 09/09/2014   K 4.7 09/09/2014   CL 107 09/09/2014   CO2 26 09/09/2014   Lab Results  Component Value Date   ALT 15 02/19/2014   AST 19 02/19/2014   ALKPHOS 57 02/19/2014   BILITOT 0.5 02/19/2014   Lab Results  Component Value Date   CHOL 120 02/19/2014   Lab Results  Component Value Date   HDL 36.50* 02/19/2014   Lab Results  Component Value Date   LDLCALC 66 02/19/2014   Lab Results  Component Value Date   TRIG 89.0  02/19/2014   Lab Results  Component Value Date   CHOLHDL 3 02/19/2014   Lab Results  Component Value Date   HGBA1C 6.3* 09/09/2014   IMPRESSION AND PLAN:  1) DM 2: noncompliant with home monitoring and with diet, but control has historically been good.   HbA1c check today.  2) HTN; The current medical regimen is effective;  continue present plan and medications. Lytes/cr check today.  3) Hyperlipidemia: recheck FLP today.   4) CRI, stage 2 (CrCl 60s): check  BMET today.  An After Visit Summary was printed and given to the patient.  FOLLOW UP: Return in about 6 months (around 09/11/2015) for annual CPE (fasting).

## 2015-03-29 ENCOUNTER — Ambulatory Visit: Payer: Medicare Other | Admitting: Family Medicine

## 2015-04-05 ENCOUNTER — Other Ambulatory Visit: Payer: Self-pay | Admitting: *Deleted

## 2015-04-05 NOTE — Telephone Encounter (Signed)
RF request for allopurinol LOV: 03/11/15 Next ov: None Last written: 04/03/14 #90 w/ 3RF  Please advise. Thanks.

## 2015-04-06 MED ORDER — ALLOPURINOL 300 MG PO TABS: 300.0000 mg | ORAL_TABLET | Freq: Every day | ORAL | Status: DC

## 2015-04-12 ENCOUNTER — Ambulatory Visit (INDEPENDENT_AMBULATORY_CARE_PROVIDER_SITE_OTHER): Payer: Medicare Other | Admitting: General Practice

## 2015-04-12 ENCOUNTER — Ambulatory Visit: Payer: Medicare Other

## 2015-04-12 ENCOUNTER — Other Ambulatory Visit: Payer: Self-pay | Admitting: *Deleted

## 2015-04-12 DIAGNOSIS — I4891 Unspecified atrial fibrillation: Secondary | ICD-10-CM | POA: Diagnosis not present

## 2015-04-12 DIAGNOSIS — Z5181 Encounter for therapeutic drug level monitoring: Secondary | ICD-10-CM

## 2015-04-12 LAB — POCT INR: INR: 2

## 2015-04-12 MED ORDER — METFORMIN HCL 500 MG PO TABS: 500.0000 mg | ORAL_TABLET | Freq: Every day | ORAL | Status: DC

## 2015-04-12 NOTE — Telephone Encounter (Signed)
RF request for metformin LOV: 03/11/15 Next ov: None Last written: 10/09/14 #90 w/ 1RF

## 2015-04-12 NOTE — Progress Notes (Signed)
Pre visit review using our clinic review tool, if applicable. No additional management support is needed unless otherwise documented below in the visit note. 

## 2015-04-12 NOTE — Progress Notes (Signed)
I agree with this plan.

## 2015-04-29 ENCOUNTER — Encounter: Payer: Self-pay | Admitting: Family Medicine

## 2015-04-29 ENCOUNTER — Ambulatory Visit (HOSPITAL_BASED_OUTPATIENT_CLINIC_OR_DEPARTMENT_OTHER)
Admission: RE | Admit: 2015-04-29 | Discharge: 2015-04-29 | Disposition: A | Discharge status: Home / Self Care | Payer: Medicare Other | Source: Ambulatory Visit | Attending: Family Medicine | Admitting: Family Medicine

## 2015-04-29 ENCOUNTER — Ambulatory Visit (INDEPENDENT_AMBULATORY_CARE_PROVIDER_SITE_OTHER): Payer: Medicare Other | Admitting: Family Medicine

## 2015-04-29 VITALS — BP 121/79 | HR 76 | Temp 98.0°F | Resp 17 | Ht 71.0 in | Wt 267.4 lb

## 2015-04-29 DIAGNOSIS — K5909 Other constipation: Secondary | ICD-10-CM | POA: Insufficient documentation

## 2015-04-29 DIAGNOSIS — M799 Soft tissue disorder, unspecified: Secondary | ICD-10-CM | POA: Diagnosis not present

## 2015-04-29 DIAGNOSIS — M7989 Other specified soft tissue disorders: Secondary | ICD-10-CM

## 2015-04-29 DIAGNOSIS — K5900 Constipation, unspecified: Secondary | ICD-10-CM

## 2015-04-29 MED ORDER — LINACLOTIDE 145 MCG PO CAPS: 145.0000 ug | ORAL_CAPSULE | Freq: Every day | ORAL | Status: DC

## 2015-04-29 NOTE — Progress Notes (Signed)
Pre visit review using our clinic review tool, if applicable. No additional management support is needed unless otherwise documented below in the visit note. 

## 2015-04-29 NOTE — Assessment & Plan Note (Signed)
New to provider, ongoing for pt.  No relief from Miralax.  Using Dulcolax and stool softener w/ minimal relief.  Start Linzess daily.  Will follow.

## 2015-04-29 NOTE — Patient Instructions (Signed)
Follow up as scheduled 6/21- sooner if needed Start the Linzess once daily for the chronic constipation We'll notify you of the ultrasound results Apply heat to the area on the arm to help the body reabsorb the hematoma Call with any questions or concerns Welcome!  We're glad to have you! Hang in there!!!

## 2015-04-29 NOTE — Assessment & Plan Note (Signed)
New.  This is consistent w/ hematoma due to his use of coumadin but we need to get [place] to r/o other causes.  Plan is to treat as hematoma until [place] results available- heat, gentle massage.  Pt expressed understanding and is in agreement w/ plan.

## 2015-04-29 NOTE — Progress Notes (Signed)
Called pt and left a detailed message on voicemail to inform.  

## 2015-04-29 NOTE — Progress Notes (Signed)
   Subjective:    Patient ID: CHRISSHAWN FOREE, male    DOB: 12-31-1931, 82 y.o.   MRN: [id number]  HPI Constipation- chronic problem, taking stool softener nightly and dulcolax 'every couple of nights'.  Pt is asking for additional laxative to assist w/ BM.  Pt has taken Miralax in the past w/ minimal relief.    R arm pain- pt reports he had golf ball sized lesion on dorsum of R distal forearm w/ associated hand and arm swelling.  First noted on Friday night- applied ice w/ some improvement in pain and swelling.  Pt reports he uses R arm to brace himself when straining for BMs.  On coumadin.     Review of Systems For ROS see HPI     Objective:   Physical Exam  Constitutional: He is oriented to person, place, and time. He appears well-developed and well-nourished. No distress.  HENT:  Head: Normocephalic and atraumatic.  Abdominal: Soft. Bowel sounds are normal. He exhibits no distension. There is no tenderness. There is no rebound.  Musculoskeletal: He exhibits edema (firm, 2-3 cm soft tissue swelling of R distal forearm w/ mild TTP) and tenderness (mild TTP over soft tissue swelling).  Neurological: He is alert and oriented to person, place, and time.  Skin: Skin is warm and dry. No erythema.  Psychiatric: He has a normal mood and affect. His behavior is normal. Thought content normal.  Vitals reviewed.         Assessment & Plan:

## 2015-05-24 ENCOUNTER — Ambulatory Visit (INDEPENDENT_AMBULATORY_CARE_PROVIDER_SITE_OTHER): Payer: Medicare Other | Admitting: General Practice

## 2015-05-24 DIAGNOSIS — Z5181 Encounter for therapeutic drug level monitoring: Secondary | ICD-10-CM

## 2015-05-24 DIAGNOSIS — I4891 Unspecified atrial fibrillation: Secondary | ICD-10-CM | POA: Diagnosis not present

## 2015-05-24 LAB — POCT INR: INR: 2.8

## 2015-05-24 NOTE — Progress Notes (Signed)
Pre visit review using our clinic review tool, if applicable. No additional management support is needed unless otherwise documented below in the visit note. 

## 2015-05-25 NOTE — Progress Notes (Signed)
I agree with this plan.

## 2015-06-23 ENCOUNTER — Ambulatory Visit (INDEPENDENT_AMBULATORY_CARE_PROVIDER_SITE_OTHER): Payer: Medicare Other | Admitting: Family Medicine

## 2015-06-23 ENCOUNTER — Encounter: Payer: Self-pay | Admitting: Family Medicine

## 2015-06-23 VITALS — BP 120/76 | HR 77 | Temp 98.0°F | Resp 16 | Ht 71.0 in | Wt 267.0 lb

## 2015-06-23 DIAGNOSIS — I482 Chronic atrial fibrillation, unspecified: Secondary | ICD-10-CM

## 2015-06-23 DIAGNOSIS — I10 Essential (primary) hypertension: Secondary | ICD-10-CM

## 2015-06-23 DIAGNOSIS — E785 Hyperlipidemia, unspecified: Secondary | ICD-10-CM | POA: Diagnosis not present

## 2015-06-23 DIAGNOSIS — E119 Type 2 diabetes mellitus without complications: Secondary | ICD-10-CM

## 2015-06-23 LAB — HEMOGLOBIN A1C: Hgb A1c MFr Bld: 6.2 % (ref 4.6–6.5)

## 2015-06-23 LAB — LIPID PANEL
CHOL/HDL RATIO: 3
Cholesterol: 123 mg/dL (ref 0–200)
HDL: 37.1 mg/dL — ABNORMAL LOW (ref >39.00)
LDL Cholesterol: 60 mg/dL (ref 0–99)
NonHDL: 85.5
TRIGLYCERIDES: 127 mg/dL (ref 0.0–149.0)
VLDL: 25.4 mg/dL (ref 0.0–40.0)

## 2015-06-23 LAB — BASIC METABOLIC PANEL
BUN: 15 mg/dL (ref 6–23)
CALCIUM: 9 mg/dL (ref 8.4–10.5)
CO2: 28 mEq/L (ref 19–32)
Chloride: 107 mEq/L (ref 96–112)
Creatinine, Ser: 1.22 mg/dL (ref 0.40–1.50)
GFR: 60.41 mL/min (ref >60.00)
GLUCOSE: 115 mg/dL — AB (ref 70–99)
Potassium: 4.6 mEq/L (ref 3.5–5.1)
SODIUM: 139 meq/L (ref 135–145)

## 2015-06-23 LAB — HEPATIC FUNCTION PANEL
ALK PHOS: 57 U/L (ref 39–117)
ALT: 10 U/L (ref 0–53)
AST: 16 U/L (ref 0–37)
Albumin: 3.8 g/dL (ref 3.5–5.2)
BILIRUBIN DIRECT: 0.2 mg/dL (ref 0.0–0.3)
TOTAL PROTEIN: 6.5 g/dL (ref 6.0–8.3)
Total Bilirubin: 0.6 mg/dL (ref 0.2–1.2)

## 2015-06-23 LAB — CBC WITH DIFFERENTIAL/PLATELET
BASOS ABS: 0 10*3/uL (ref 0.0–0.1)
Basophils Relative: 0.5 % (ref 0.0–3.0)
EOS ABS: 0.2 10*3/uL (ref 0.0–0.7)
Eosinophils Relative: 2.1 % (ref 0.0–5.0)
HCT: 39 % (ref 39.0–52.0)
Hemoglobin: 12.6 g/dL — ABNORMAL LOW (ref 13.0–17.0)
LYMPHS ABS: 1.1 10*3/uL (ref 0.7–4.0)
Lymphocytes Relative: 15.9 % (ref 12.0–46.0)
MCHC: 32.4 g/dL (ref 30.0–36.0)
MCV: 90.2 fl (ref 78.0–100.0)
MONO ABS: 0.7 10*3/uL (ref 0.1–1.0)
Monocytes Relative: 10.1 % (ref 3.0–12.0)
NEUTROS PCT: 71.4 % (ref 43.0–77.0)
Neutro Abs: 5 10*3/uL (ref 1.4–7.7)
Platelets: 213 10*3/uL (ref 150.0–400.0)
RBC: 4.32 Mil/uL (ref 4.22–5.81)
RDW: 15.2 % (ref 11.5–15.5)
WBC: 7 10*3/uL (ref 4.0–10.5)

## 2015-06-23 LAB — TSH: TSH: 0.98 u[IU]/mL (ref 0.35–4.50)

## 2015-06-23 NOTE — Assessment & Plan Note (Signed)
Chronic problem.  Well controlled today.  Asymptomatic.  Check labs.  No anticipated med changes.  Will follow. 

## 2015-06-23 NOTE — Patient Instructions (Signed)
Schedule your complete physical in 6 months We'll notify you of your lab results and make any changes if needed Continue to make healthy food choices and get activity as you are able Call with any questions or concerns Have a great summer!!!

## 2015-06-23 NOTE — Assessment & Plan Note (Signed)
Chronic problem.  Well controlled.  Asymptomatic.  On ACE for renal protection.  UTD on eye exam.  Foot exam done today.  Encouraged healthy diet and exercise as able.  Check labs.  Adjust meds prn

## 2015-06-23 NOTE — Assessment & Plan Note (Signed)
Chronic problem, tolerating statin w/o difficulty.  Stressed need for healthy diet and regular exercise.  Check labs.  Adjust meds prn  

## 2015-06-23 NOTE — Assessment & Plan Note (Signed)
Chronic problem.  Asymptomatic.  Rate controlled on Metoprolol.  On Coumadin.  Will continue to follow.

## 2015-06-23 NOTE — Progress Notes (Signed)
Pre visit review using our clinic review tool, if applicable. No additional management support is needed unless otherwise documented below in the visit note. 

## 2015-06-23 NOTE — Progress Notes (Signed)
   Subjective:    Patient ID: [name], male    DOB: [birth year], 82 y.o.   MRN: [id number]  HPI HTN- chronic problem, well controlled on Metoprolol, Lisinopril.  No CP, SOB, HAs, visual changes.  + intermittent edema of bilateral LEs.    DM- chronic problem, on Metformin daily.  UTD on foot exam, eye exam.  On ACE for renal protection.  Denies symptomatic lows.  No numbness/tingling.  No sores or wounds on feet.  Hyperlipidemia- chronic problem, on Simvastatin daily.  Denies abd pain, N/V, myalgias  Afib- chronic problem.  On Coumadin (per coumadin clinic) and metoprolol for rate control.  Asymptomatic- no palpitations.  Review of Systems For ROS see HPI     Objective:   Physical Exam  Constitutional: He is oriented to person, place, and time. He appears well-developed and well-nourished. No distress.  obese  HENT:  Head: Normocephalic and atraumatic.  Eyes: Conjunctivae and EOM are normal. Pupils are equal, round, and reactive to light.  Neck: Normal range of motion. Neck supple. No thyromegaly present.  Cardiovascular: Normal rate, regular rhythm, normal heart sounds and intact distal pulses.   No murmur heard. Pulmonary/Chest: Effort normal and breath sounds normal. No respiratory distress.  Abdominal: Soft. Bowel sounds are normal. He exhibits no distension.  Musculoskeletal: He exhibits no edema.  Lymphadenopathy:    He has no cervical adenopathy.  Neurological: He is alert and oriented to person, place, and time. No cranial nerve deficit.  Skin: Skin is warm and dry.  Psychiatric: He has a normal mood and affect. His behavior is normal.  Vitals reviewed.         Assessment & Plan:

## 2015-06-24 ENCOUNTER — Encounter: Payer: Self-pay | Admitting: General Practice

## 2015-06-28 ENCOUNTER — Ambulatory Visit (INDEPENDENT_AMBULATORY_CARE_PROVIDER_SITE_OTHER): Payer: Medicare Other | Admitting: General Practice

## 2015-06-28 DIAGNOSIS — Z5181 Encounter for therapeutic drug level monitoring: Secondary | ICD-10-CM | POA: Diagnosis not present

## 2015-06-28 DIAGNOSIS — I4891 Unspecified atrial fibrillation: Secondary | ICD-10-CM | POA: Diagnosis not present

## 2015-06-28 LAB — POCT INR: INR: 2.7

## 2015-06-28 NOTE — Progress Notes (Signed)
Pre visit review using our clinic review tool, if applicable. No additional management support is needed unless otherwise documented below in the visit note. 

## 2015-06-28 NOTE — Progress Notes (Signed)
I agree with this plan.

## 2015-06-29 ENCOUNTER — Other Ambulatory Visit: Payer: Self-pay | Admitting: General Practice

## 2015-06-29 MED ORDER — WARFARIN SODIUM 5 MG PO TABS: ORAL_TABLET | ORAL | Status: DC

## 2015-07-26 ENCOUNTER — Ambulatory Visit (INDEPENDENT_AMBULATORY_CARE_PROVIDER_SITE_OTHER): Payer: Medicare Other | Admitting: General Practice

## 2015-07-26 DIAGNOSIS — Z5181 Encounter for therapeutic drug level monitoring: Secondary | ICD-10-CM | POA: Diagnosis not present

## 2015-07-26 DIAGNOSIS — I4891 Unspecified atrial fibrillation: Secondary | ICD-10-CM | POA: Diagnosis not present

## 2015-07-26 LAB — POCT INR: INR: 2.6

## 2015-07-27 NOTE — Progress Notes (Signed)
I agree with this plan.

## 2015-09-13 ENCOUNTER — Ambulatory Visit: Payer: Medicare Other

## 2015-09-20 ENCOUNTER — Ambulatory Visit (INDEPENDENT_AMBULATORY_CARE_PROVIDER_SITE_OTHER): Payer: Medicare Other | Admitting: General Practice

## 2015-09-20 DIAGNOSIS — I4891 Unspecified atrial fibrillation: Secondary | ICD-10-CM | POA: Diagnosis not present

## 2015-09-20 DIAGNOSIS — Z5181 Encounter for therapeutic drug level monitoring: Secondary | ICD-10-CM | POA: Diagnosis not present

## 2015-09-20 LAB — POCT INR: INR: 2.7

## 2015-09-20 NOTE — Progress Notes (Signed)
I agree with this plan.

## 2015-10-21 ENCOUNTER — Other Ambulatory Visit: Payer: Self-pay | Admitting: Family Medicine

## 2015-11-01 ENCOUNTER — Ambulatory Visit (INDEPENDENT_AMBULATORY_CARE_PROVIDER_SITE_OTHER): Payer: Medicare Other | Admitting: General Practice

## 2015-11-01 DIAGNOSIS — Z5181 Encounter for therapeutic drug level monitoring: Secondary | ICD-10-CM | POA: Diagnosis not present

## 2015-11-01 DIAGNOSIS — I4891 Unspecified atrial fibrillation: Secondary | ICD-10-CM

## 2015-11-01 LAB — POCT INR: INR: 2.4

## 2015-11-01 NOTE — Patient Instructions (Signed)
Pre visit review using our clinic review tool, if applicable. No additional management support is needed unless otherwise documented below in the visit note. 

## 2015-11-09 ENCOUNTER — Other Ambulatory Visit: Payer: Self-pay

## 2015-11-09 MED ORDER — LISINOPRIL 2.5 MG PO TABS: 2.5000 mg | ORAL_TABLET | Freq: Every day | ORAL | 0 refills | Status: DC

## 2015-12-08 ENCOUNTER — Other Ambulatory Visit: Payer: Self-pay | Admitting: Family Medicine

## 2015-12-08 NOTE — Telephone Encounter (Signed)
Please review. I did not see that this was a [facility] pt til I sent the Rx.

## 2015-12-08 NOTE — Telephone Encounter (Signed)
RF request for lisinopril LOV: 03/11/15 Next ov: None Last written: 11/09/15 #30 w/ 0RF  Will send Rx for #30 w/ 0RF. Pt need to schedule f/u for RCI or CPE for more refills.

## 2015-12-13 ENCOUNTER — Ambulatory Visit (INDEPENDENT_AMBULATORY_CARE_PROVIDER_SITE_OTHER): Payer: Medicare Other | Admitting: General Practice

## 2015-12-13 DIAGNOSIS — I4891 Unspecified atrial fibrillation: Secondary | ICD-10-CM

## 2015-12-13 DIAGNOSIS — Z5181 Encounter for therapeutic drug level monitoring: Secondary | ICD-10-CM

## 2015-12-13 LAB — POCT INR: INR: 3.3

## 2015-12-13 NOTE — Patient Instructions (Signed)
Pre visit review using our clinic review tool, if applicable. No additional management support is needed unless otherwise documented below in the visit note. 

## 2015-12-13 NOTE — Progress Notes (Signed)
I agree with this plan.

## 2015-12-16 ENCOUNTER — Other Ambulatory Visit: Payer: Self-pay | Admitting: Family Medicine

## 2015-12-20 ENCOUNTER — Encounter: Payer: Self-pay | Admitting: Family Medicine

## 2015-12-20 ENCOUNTER — Ambulatory Visit (INDEPENDENT_AMBULATORY_CARE_PROVIDER_SITE_OTHER): Payer: Medicare Other | Admitting: Family Medicine

## 2015-12-20 VITALS — BP 121/74 | HR 65 | Temp 98.1°F | Resp 16 | Ht 71.0 in | Wt 266.5 lb

## 2015-12-20 DIAGNOSIS — Z125 Encounter for screening for malignant neoplasm of prostate: Secondary | ICD-10-CM | POA: Diagnosis not present

## 2015-12-20 DIAGNOSIS — E119 Type 2 diabetes mellitus without complications: Secondary | ICD-10-CM

## 2015-12-20 DIAGNOSIS — Z0000 Encounter for general adult medical examination without abnormal findings: Secondary | ICD-10-CM

## 2015-12-20 DIAGNOSIS — E785 Hyperlipidemia, unspecified: Secondary | ICD-10-CM | POA: Diagnosis not present

## 2015-12-20 DIAGNOSIS — I482 Chronic atrial fibrillation, unspecified: Secondary | ICD-10-CM

## 2015-12-20 DIAGNOSIS — Z23 Encounter for immunization: Secondary | ICD-10-CM | POA: Diagnosis not present

## 2015-12-20 DIAGNOSIS — I10 Essential (primary) hypertension: Secondary | ICD-10-CM | POA: Diagnosis not present

## 2015-12-20 LAB — CBC WITH DIFFERENTIAL/PLATELET
BASOS PCT: 0.6 % (ref 0.0–3.0)
Basophils Absolute: 0 10*3/uL (ref 0.0–0.1)
EOS PCT: 2.4 % (ref 0.0–5.0)
Eosinophils Absolute: 0.1 10*3/uL (ref 0.0–0.7)
HCT: 35.3 % — ABNORMAL LOW (ref 39.0–52.0)
Hemoglobin: 11.6 g/dL — ABNORMAL LOW (ref 13.0–17.0)
LYMPHS ABS: 1 10*3/uL (ref 0.7–4.0)
Lymphocytes Relative: 18.1 % (ref 12.0–46.0)
MCHC: 32.8 g/dL (ref 30.0–36.0)
MCV: 90.1 fl (ref 78.0–100.0)
MONO ABS: 0.7 10*3/uL (ref 0.1–1.0)
MONOS PCT: 11.5 % (ref 3.0–12.0)
NEUTROS ABS: 3.9 10*3/uL (ref 1.4–7.7)
NEUTROS PCT: 67.4 % (ref 43.0–77.0)
Platelets: 219 10*3/uL (ref 150.0–400.0)
RBC: 3.91 Mil/uL — ABNORMAL LOW (ref 4.22–5.81)
RDW: 15.1 % (ref 11.5–15.5)
WBC: 5.8 10*3/uL (ref 4.0–10.5)

## 2015-12-20 LAB — BASIC METABOLIC PANEL
BUN: 19 mg/dL (ref 6–23)
CHLORIDE: 106 meq/L (ref 96–112)
CO2: 27 meq/L (ref 19–32)
CREATININE: 1.1 mg/dL (ref 0.40–1.50)
Calcium: 8.6 mg/dL (ref 8.4–10.5)
GFR: 67.99 mL/min (ref >60.00)
Glucose, Bld: 117 mg/dL — ABNORMAL HIGH (ref 70–99)
POTASSIUM: 4.6 meq/L (ref 3.5–5.1)
Sodium: 140 mEq/L (ref 135–145)

## 2015-12-20 LAB — HEPATIC FUNCTION PANEL
ALBUMIN: 3.7 g/dL (ref 3.5–5.2)
ALK PHOS: 59 U/L (ref 39–117)
ALT: 9 U/L (ref 0–53)
AST: 15 U/L (ref 0–37)
Bilirubin, Direct: 0.1 mg/dL (ref 0.0–0.3)
Total Bilirubin: 0.5 mg/dL (ref 0.2–1.2)
Total Protein: 5.9 g/dL — ABNORMAL LOW (ref 6.0–8.3)

## 2015-12-20 LAB — LIPID PANEL
CHOL/HDL RATIO: 3
CHOLESTEROL: 118 mg/dL (ref 0–200)
HDL: 38.4 mg/dL — AB (ref >39.00)
LDL Cholesterol: 65 mg/dL (ref 0–99)
NonHDL: 79.29
TRIGLYCERIDES: 72 mg/dL (ref 0.0–149.0)
VLDL: 14.4 mg/dL (ref 0.0–40.0)

## 2015-12-20 LAB — PSA, MEDICARE: PSA: 3.89 ng/mL (ref 0.10–4.00)

## 2015-12-20 LAB — TSH: TSH: 1.17 u[IU]/mL (ref 0.35–4.50)

## 2015-12-20 LAB — HEMOGLOBIN A1C: HEMOGLOBIN A1C: 5.8 % (ref 4.6–6.5)

## 2015-12-20 NOTE — Progress Notes (Signed)
   Subjective:    Patient ID: [name], male    DOB: 1931/02/01, 82 y.o.   MRN: [id number]  HPI Here today for CPE.  Risk Factors: DM- chronic problem, on Metformin twice daily.  On ACE for renal protection.  UTD on foot exam.  Eye exam done at [facility] last month. HTN- chronic problem, well controlled on Lisinopril, Metoprolol. Hyperlipidemia- chronic problem, on Simvastatin 20mg . Afib- chronic problem, on Coumadin and Metoprolol.  Goes to Coumadin clinic for management but prefers to switch to Eliquis or Xarelto Physical Activity: no formal exercise Fall Risk: low Depression: denies  Hearing: normal to conversational tones and whispered voice ADL's: independent Cognitive: normal linear thought process, memory and attention intact Home Safety: safe at home, lives w/ wife Height, Weight, BMI, Visual Acuity: see vitals, vision corrected to 20/20 w/ glasses and cataract surgery Counseling: no need for colonoscopy due to age, due for PSA, UTD on Prevnar.  Due for Pneumovax.  Declines flu. Labs Ordered: See A&P Care Plan: See A&P    Review of Systems Patient reports no vision/hearing changes, anorexia, fever ,adenopathy, persistant/recurrent hoarseness, swallowing issues, chest pain, palpitations, edema, persistant/recurrent cough, hemoptysis, dyspnea (rest,exertional, paroxysmal nocturnal), gastrointestinal  bleeding (melena, rectal bleeding), abdominal pain, excessive heart burn, GU symptoms (dysuria, hematuria, voiding/incontinence issues) syncope, focal weakness, memory loss, numbness & tingling, skin/hair/nail changes, depression, anxiety, abnormal bruising/bleeding, musculoskeletal symptoms/signs.     Objective:   Physical Exam General Appearance:    Alert, cooperative, no distress, appears stated age, obese  Head:    Normocephalic, without obvious abnormality, atraumatic  Eyes:    PERRL, conjunctiva/corneas clear, EOM's intact, fundi    benign, both eyes       Ears:    Normal TM's  and external ear canals, both ears  Nose:   Nares normal, septum midline, mucosa normal, no drainage   or sinus tenderness  Throat:   Lips, mucosa, and tongue normal; teeth and gums normal  Neck:   Supple, symmetrical, trachea midline, no adenopathy;       thyroid:  No enlargement/tenderness/nodules  Back:     Symmetric, no curvature, ROM normal, no CVA tenderness  Lungs:     Clear to auscultation bilaterally, respirations unlabored  Chest wall:    No tenderness or deformity  Heart:    Irregular S1/S2 but rate controlled, no murmurs, rubs, gallops  Abdomen:     Soft, non-tender, bowel sounds active all four quadrants,    no masses, no organomegaly  Genitalia:    Deferred at pt's request  Rectal:    Extremities:   Extremities normal, atraumatic, no cyanosis or edema  Pulses:   2+ and symmetric all extremities  Skin:   Skin color, texture, turgor normal, no rashes or lesions  Lymph nodes:   Cervical, supraclavicular, and axillary nodes normal  Neurologic:   CNII-XII intact. Normal strength, sensation and reflexes      throughout          Assessment & Plan:

## 2015-12-20 NOTE — Assessment & Plan Note (Signed)
Chronic problem.  Currently rate controlled and on Coumadin via Coumadin clinic.  Is looking into switching to Eliquis or Xarelto through the [place].

## 2015-12-20 NOTE — Assessment & Plan Note (Signed)
Pt's PE WNL w/ exception of obesity.  No need for colonoscopy at this time.  Check PSA.  Pneumovax updated.  Written screening schedule updated and given to pt. Check labs.  Anticipatory guidance provided.

## 2015-12-20 NOTE — Patient Instructions (Signed)
Follow up in 3-4 months to recheck diabetes We'll notify you of your lab results and make any changes if needed Continue to work on healthy diet and regular exercise- you can do it!!! You do not need a colonoscopy- yay!!! With today's Pneumovax, you are up to date on immunizations- yay!! [facility] Eliquis or Xarelto for the Afib Call with any questions or concerns Happy Holidays!!!

## 2015-12-20 NOTE — Assessment & Plan Note (Signed)
Chronic problem.  Well controlled today.  Asymptomatic at this time.  Check labs.  No anticipated med changes. 

## 2015-12-20 NOTE — Assessment & Plan Note (Signed)
Chronic problem.  Asymptomatic at this time.  [facility] w[facility].  On ACE for renal protection.  Check labs.  Adjust meds prn

## 2015-12-20 NOTE — Assessment & Plan Note (Signed)
Chronic problem.  Tolerating statin w/o difficulty.  Check labs.  Adjust meds prn  

## 2015-12-20 NOTE — Progress Notes (Signed)
Pre visit review using our clinic review tool, if applicable. No additional management support is needed unless otherwise documented below in the visit note. 

## 2015-12-21 ENCOUNTER — Encounter: Payer: Self-pay | Admitting: General Practice

## 2016-01-09 ENCOUNTER — Other Ambulatory Visit: Payer: Self-pay | Admitting: Family Medicine

## 2016-01-21 ENCOUNTER — Inpatient Hospital Stay (HOSPITAL_COMMUNITY): Payer: Medicare Other

## 2016-01-21 ENCOUNTER — Inpatient Hospital Stay (HOSPITAL_COMMUNITY)
Admission: EM | Admit: 2016-01-21 | Discharge: 2016-01-23 | DRG: 378 | Disposition: A | Discharge status: Home / Self Care | Payer: Medicare Other | Attending: Internal Medicine | Admitting: Internal Medicine

## 2016-01-21 ENCOUNTER — Encounter (HOSPITAL_COMMUNITY): Payer: Self-pay | Admitting: Emergency Medicine

## 2016-01-21 DIAGNOSIS — E119 Type 2 diabetes mellitus without complications: Secondary | ICD-10-CM

## 2016-01-21 DIAGNOSIS — D62 Acute posthemorrhagic anemia: Secondary | ICD-10-CM | POA: Diagnosis present

## 2016-01-21 DIAGNOSIS — Z79899 Other long term (current) drug therapy: Secondary | ICD-10-CM

## 2016-01-21 DIAGNOSIS — I482 Chronic atrial fibrillation, unspecified: Secondary | ICD-10-CM | POA: Diagnosis present

## 2016-01-21 DIAGNOSIS — D689 Coagulation defect, unspecified: Secondary | ICD-10-CM

## 2016-01-21 DIAGNOSIS — K625 Hemorrhage of anus and rectum: Secondary | ICD-10-CM | POA: Diagnosis present

## 2016-01-21 DIAGNOSIS — Z833 Family history of diabetes mellitus: Secondary | ICD-10-CM | POA: Diagnosis not present

## 2016-01-21 DIAGNOSIS — E1122 Type 2 diabetes mellitus with diabetic chronic kidney disease: Secondary | ICD-10-CM | POA: Diagnosis present

## 2016-01-21 DIAGNOSIS — D500 Iron deficiency anemia secondary to blood loss (chronic): Secondary | ICD-10-CM | POA: Diagnosis present

## 2016-01-21 DIAGNOSIS — K626 Ulcer of anus and rectum: Secondary | ICD-10-CM | POA: Diagnosis present

## 2016-01-21 DIAGNOSIS — D120 Benign neoplasm of cecum: Secondary | ICD-10-CM | POA: Diagnosis present

## 2016-01-21 DIAGNOSIS — Z811 Family history of alcohol abuse and dependence: Secondary | ICD-10-CM

## 2016-01-21 DIAGNOSIS — Z86010 Personal history of colonic polyps: Secondary | ICD-10-CM | POA: Diagnosis not present

## 2016-01-21 DIAGNOSIS — I4891 Unspecified atrial fibrillation: Secondary | ICD-10-CM | POA: Diagnosis present

## 2016-01-21 DIAGNOSIS — E118 Type 2 diabetes mellitus with unspecified complications: Secondary | ICD-10-CM

## 2016-01-21 DIAGNOSIS — R791 Abnormal coagulation profile: Secondary | ICD-10-CM | POA: Diagnosis present

## 2016-01-21 DIAGNOSIS — Z87891 Personal history of nicotine dependence: Secondary | ICD-10-CM

## 2016-01-21 DIAGNOSIS — M109 Gout, unspecified: Secondary | ICD-10-CM | POA: Diagnosis present

## 2016-01-21 DIAGNOSIS — K5909 Other constipation: Secondary | ICD-10-CM | POA: Diagnosis present

## 2016-01-21 DIAGNOSIS — K921 Melena: Secondary | ICD-10-CM | POA: Diagnosis present

## 2016-01-21 DIAGNOSIS — K922 Gastrointestinal hemorrhage, unspecified: Secondary | ICD-10-CM

## 2016-01-21 DIAGNOSIS — I129 Hypertensive chronic kidney disease with stage 1 through stage 4 chronic kidney disease, or unspecified chronic kidney disease: Secondary | ICD-10-CM | POA: Diagnosis present

## 2016-01-21 DIAGNOSIS — K219 Gastro-esophageal reflux disease without esophagitis: Secondary | ICD-10-CM | POA: Diagnosis present

## 2016-01-21 DIAGNOSIS — I872 Venous insufficiency (chronic) (peripheral): Secondary | ICD-10-CM | POA: Diagnosis present

## 2016-01-21 DIAGNOSIS — D6832 Hemorrhagic disorder due to extrinsic circulating anticoagulants: Secondary | ICD-10-CM | POA: Diagnosis present

## 2016-01-21 DIAGNOSIS — E785 Hyperlipidemia, unspecified: Secondary | ICD-10-CM | POA: Diagnosis present

## 2016-01-21 DIAGNOSIS — D649 Anemia, unspecified: Secondary | ICD-10-CM | POA: Diagnosis present

## 2016-01-21 DIAGNOSIS — I10 Essential (primary) hypertension: Secondary | ICD-10-CM | POA: Diagnosis present

## 2016-01-21 DIAGNOSIS — T45515A Adverse effect of anticoagulants, initial encounter: Secondary | ICD-10-CM

## 2016-01-21 DIAGNOSIS — N182 Chronic kidney disease, stage 2 (mild): Secondary | ICD-10-CM | POA: Diagnosis present

## 2016-01-21 DIAGNOSIS — R7303 Prediabetes: Secondary | ICD-10-CM

## 2016-01-21 DIAGNOSIS — D122 Benign neoplasm of ascending colon: Secondary | ICD-10-CM | POA: Diagnosis present

## 2016-01-21 LAB — IRON AND TIBC
IRON: 73 ug/dL (ref 45–182)
SATURATION RATIOS: 24 % (ref 17.9–39.5)
TIBC: 305 ug/dL (ref 250–450)
UIBC: 232 ug/dL

## 2016-01-21 LAB — PROTIME-INR
INR: 3.38
INR: 3.26
Prothrombin Time: 35 seconds — ABNORMAL HIGH (ref 11.4–15.2)
Prothrombin Time: 34 seconds — ABNORMAL HIGH (ref 11.4–15.2)

## 2016-01-21 LAB — BASIC METABOLIC PANEL
Anion gap: 4 — ABNORMAL LOW (ref 5–15)
BUN: 16 mg/dL (ref 6–20)
CALCIUM: 8.4 mg/dL — AB (ref 8.9–10.3)
CO2: 25 mmol/L (ref 22–32)
CREATININE: 1.19 mg/dL (ref 0.61–1.24)
Chloride: 109 mmol/L (ref 101–111)
GFR calc non Af Amer: 55 mL/min — ABNORMAL LOW (ref >60)
Glucose, Bld: 141 mg/dL — ABNORMAL HIGH (ref 65–99)
Potassium: 4.2 mmol/L (ref 3.5–5.1)
Sodium: 138 mmol/L (ref 135–145)

## 2016-01-21 LAB — ABO/RH: ABO/RH(D): A POS

## 2016-01-21 LAB — SAMPLE TO BLOOD BANK

## 2016-01-21 LAB — FERRITIN: Ferritin: 21 ng/mL — ABNORMAL LOW (ref 24–336)

## 2016-01-21 LAB — TYPE AND SCREEN
ABO/RH(D): A POS
Antibody Screen: NEGATIVE

## 2016-01-21 LAB — GLUCOSE, CAPILLARY
GLUCOSE-CAPILLARY: 126 mg/dL — AB (ref 65–99)
GLUCOSE-CAPILLARY: 128 mg/dL — AB (ref 65–99)
Glucose-Capillary: 137 mg/dL — ABNORMAL HIGH (ref 65–99)

## 2016-01-21 LAB — CBC
HEMATOCRIT: 33.8 % — AB (ref 39.0–52.0)
Hemoglobin: 10.6 g/dL — ABNORMAL LOW (ref 13.0–17.0)
MCH: 28.6 pg (ref 26.0–34.0)
MCHC: 31.4 g/dL (ref 30.0–36.0)
MCV: 91.1 fL (ref 78.0–100.0)
PLATELETS: 254 10*3/uL (ref 150–400)
RBC: 3.71 MIL/uL — ABNORMAL LOW (ref 4.22–5.81)
RDW: 14.2 % (ref 11.5–15.5)
WBC: 8.1 10*3/uL (ref 4.0–10.5)

## 2016-01-21 LAB — POC OCCULT BLOOD, ED: FECAL OCCULT BLD: POSITIVE — AB

## 2016-01-21 LAB — TRANSFERRIN: Transferrin: 218 mg/dL (ref 180–329)

## 2016-01-21 LAB — HEMOGLOBIN AND HEMATOCRIT, BLOOD
HCT: 26.3 % — ABNORMAL LOW (ref 39.0–52.0)
HCT: 29.8 % — ABNORMAL LOW (ref 39.0–52.0)
HEMATOCRIT: 31.4 % — AB (ref 39.0–52.0)
HEMOGLOBIN: 9.9 g/dL — AB (ref 13.0–17.0)
Hemoglobin: 8.4 g/dL — ABNORMAL LOW (ref 13.0–17.0)
Hemoglobin: 9.5 g/dL — ABNORMAL LOW (ref 13.0–17.0)

## 2016-01-21 MED ORDER — CYANOCOBALAMIN 1000 MCG/ML IJ SOLN
1000.0000 ug | INTRAMUSCULAR | Status: DC
  Filled 2016-01-21: qty 1

## 2016-01-21 MED ORDER — METOPROLOL SUCCINATE ER 50 MG PO TB24
50.0000 mg | ORAL_TABLET | Freq: Every day | ORAL | Status: DC
  Administered 2016-01-21 – 2016-01-23 (×3): 50 mg via ORAL
  Filled 2016-01-21 (×3): qty 1

## 2016-01-21 MED ORDER — INSULIN ASPART 100 UNIT/ML ~~LOC~~ SOLN
0.0000 [IU] | Freq: Three times a day (TID) | SUBCUTANEOUS | Status: DC
  Administered 2016-01-22 – 2016-01-23 (×2): 2 [IU] via SUBCUTANEOUS

## 2016-01-21 MED ORDER — ONDANSETRON HCL 4 MG/2ML IJ SOLN: 4.0000 mg | Freq: Four times a day (QID) | INTRAMUSCULAR | Status: DC | PRN

## 2016-01-21 MED ORDER — FERROUS SULFATE 325 (65 FE) MG PO TABS
325.0000 mg | ORAL_TABLET | Freq: Every day | ORAL | Status: DC
  Administered 2016-01-21 – 2016-01-23 (×3): 325 mg via ORAL
  Filled 2016-01-21 (×3): qty 1

## 2016-01-21 MED ORDER — PANTOPRAZOLE SODIUM 40 MG PO TBEC
40.0000 mg | DELAYED_RELEASE_TABLET | Freq: Two times a day (BID) | ORAL | Status: DC
  Administered 2016-01-21 – 2016-01-23 (×5): 40 mg via ORAL
  Filled 2016-01-21 (×5): qty 1

## 2016-01-21 MED ORDER — SODIUM CHLORIDE 0.9% FLUSH
3.0000 mL | Freq: Two times a day (BID) | INTRAVENOUS | Status: DC
  Administered 2016-01-21 – 2016-01-22 (×2): 3 mL via INTRAVENOUS

## 2016-01-21 MED ORDER — SODIUM CHLORIDE 0.9 % IV BOLUS (SEPSIS)
500.0000 mL | Freq: Once | INTRAVENOUS | Status: AC
  Administered 2016-01-21: 500 mL via INTRAVENOUS

## 2016-01-21 MED ORDER — SODIUM CHLORIDE 0.9% FLUSH: 3.0000 mL | INTRAVENOUS | Status: DC | PRN

## 2016-01-21 MED ORDER — VITAMIN K1 10 MG/ML IJ SOLN
5.0000 mg | Freq: Once | INTRAVENOUS | Status: AC
  Administered 2016-01-21: 5 mg via INTRAVENOUS
  Filled 2016-01-21: qty 0.5

## 2016-01-21 MED ORDER — ACETAMINOPHEN 650 MG RE SUPP: 650.0000 mg | Freq: Four times a day (QID) | RECTAL | Status: DC | PRN

## 2016-01-21 MED ORDER — PEG-KCL-NACL-NASULF-NA ASC-C 100 G PO SOLR
0.5000 | Freq: Once | ORAL | Status: AC
Start: 2016-01-22 — End: 2016-01-22
  Administered 2016-01-22: 100 g via ORAL

## 2016-01-21 MED ORDER — SODIUM CHLORIDE 0.9 % IV SOLN
INTRAVENOUS | Status: AC
  Administered 2016-01-21: 09:00:00 via INTRAVENOUS

## 2016-01-21 MED ORDER — SODIUM CHLORIDE 0.9 % IV SOLN
Freq: Once | INTRAVENOUS | Status: AC
  Administered 2016-01-21: 20:00:00 via INTRAVENOUS

## 2016-01-21 MED ORDER — SIMVASTATIN 20 MG PO TABS
20.0000 mg | ORAL_TABLET | Freq: Every day | ORAL | Status: DC
  Administered 2016-01-21 – 2016-01-23 (×3): 20 mg via ORAL
  Filled 2016-01-21 (×3): qty 1

## 2016-01-21 MED ORDER — SODIUM CHLORIDE 0.9 % IV SOLN: 10.0000 mL/h | Freq: Once | INTRAVENOUS | Status: DC

## 2016-01-21 MED ORDER — ONDANSETRON HCL 4 MG PO TABS: 4.0000 mg | ORAL_TABLET | Freq: Four times a day (QID) | ORAL | Status: DC | PRN

## 2016-01-21 MED ORDER — ALLOPURINOL 300 MG PO TABS
300.0000 mg | ORAL_TABLET | Freq: Every day | ORAL | Status: DC
  Administered 2016-01-21 – 2016-01-23 (×3): 300 mg via ORAL
  Filled 2016-01-21 (×3): qty 1

## 2016-01-21 MED ORDER — ACETAMINOPHEN 325 MG PO TABS: 650.0000 mg | ORAL_TABLET | Freq: Four times a day (QID) | ORAL | Status: DC | PRN

## 2016-01-21 MED ORDER — PEG-KCL-NACL-NASULF-NA ASC-C 100 G PO SOLR
0.5000 | Freq: Once | ORAL | Status: AC
  Administered 2016-01-21: 100 g via ORAL
  Filled 2016-01-21: qty 1

## 2016-01-21 MED ORDER — PEG-KCL-NACL-NASULF-NA ASC-C 100 G PO SOLR: 1.0000 | Freq: Once | ORAL | Status: DC

## 2016-01-21 MED ORDER — IOPAMIDOL (ISOVUE-370) INJECTION 76%
INTRAVENOUS | Status: AC
  Administered 2016-01-21: 100 mL
  Filled 2016-01-21: qty 100

## 2016-01-21 MED ORDER — SODIUM CHLORIDE 0.9 % IV SOLN: 250.0000 mL | INTRAVENOUS | Status: DC | PRN

## 2016-01-21 NOTE — Progress Notes (Signed)
Call placed to blood bank regarding plasma transfusion. Representative stated that the lab was awaiting platelet order. It was also reported that there has been a shortage and platelets would be arriving from [place].  Call placed to hospitalist to clarify order.

## 2016-01-21 NOTE — ED Provider Notes (Signed)
[name] Provider Note   CSN: [id number] Arrival date & time: 01/21/16  [id number]     History   Chief Complaint Chief Complaint  Patient presents with  . Rectal Bleeding    HPI [name] is a 83 y.o. male.  Patient states hx hemorrhoids, and states yesterday had bright red blood per rectum, first noted 'oozing out' on bed, and then into commode. Hx afib, and is on coumadin.   States symptoms recurred last night after bm.  And again recurred this AM.  Noted moderate amount blood in commode. Denies faintness or dizziness. No abd pain. No nv. No other abnormal bruising or bleeding.     The history is provided by the patient and the EMS personnel.  Rectal Bleeding  Associated symptoms: no abdominal pain, no epistaxis and no fever     Past Medical History:  Diagnosis Date  . Anemia   . Atrial fibrillation ([place])   . Blood transfusion without reported diagnosis   . Chronic constipation    colace helps  . Chronic renal insufficiency, stage II (mild)    CrCl @60 .  . Chronic venous insufficiency   . Diabetes mellitus without complication (HCC)    hx of good control  . GI bleed    slow/occult: pan-endoscopy + capsule endoscopy showed no site of bleeding.  Takes one iron tab a day and Hb has been stable.  . Gout   . Hyperlipidemia   . Hypertension   . Varicose veins of both lower extremities with complications   . Vitamin B12 deficiency     Patient Active Problem List   Diagnosis Date Noted  . Chronic constipation 04/29/2015  . Soft tissue mass 04/29/2015  . Chronic venous insufficiency   . Encounter for therapeutic drug monitoring 05/28/2014  . Essential hypertension 03/07/2014  . Preventative health care 03/07/2014  . Varicose veins of lower extremities with other complications 0000000  . Hematoma of leg 09/09/2013  . Diabetes mellitus without complication ([place]) [id number]  . Iron deficiency anemia due to chronic blood loss 08/26/2013  . Hyperlipidemia  08/13/2006  . GOUT 08/13/2006  . ATRIAL FIBRILLATION 08/13/2006    Past Surgical History:  Procedure Laterality Date  . BUNIONECTOMY  2006  . [place]  . TONSILLECTOMY AND ADENOIDECTOMY  1945       Home Medications    Prior to Admission medications   Medication Sig Start Date End Date Taking? Authorizing Provider  allopurinol (ZYLOPRIM) 300 MG tablet Take 1 tablet (300 mg total) by mouth daily. 04/06/15   Tammi Sou, MD  cyanocobalamin (,VITAMIN B-12,) 1000 MCG/ML injection Inject 1 mL (1,000 mcg total) into the muscle every 30 (thirty) days. 04/02/14   Tammi Sou, MD  Docusate Sodium 100 MG capsule  01/23/13   Historical Provider, MD  ferrous sulfate 325 (65 FE) MG tablet Take 325 mg by mouth daily with breakfast.    Historical Provider, MD  linaclotide (LINZESS) 145 MCG CAPS capsule Take 1 capsule (145 mcg total) by mouth daily before breakfast. 04/29/15   Midge Minium, MD  lisinopril (PRINIVIL,ZESTRIL) 2.5 MG tablet Take 1 tablet (2.5 mg total) by mouth daily. 12/08/15   Tammi Sou, MD  metFORMIN (GLUCOPHAGE) 500 MG tablet TAKE 1 TABLET DAILY 10/21/15   Midge Minium, MD  metoprolol succinate (TOPROL-XL) 50 MG 24 hr tablet Take 1 tablet (50 mg total) by mouth daily. 02/26/15   Tammi Sou, MD  simvastatin (ZOCOR) 20 MG  tablet Take 1 tablet (20 mg total) by mouth daily. 02/26/15   Tammi Sou, MD  warfarin (COUMADIN) 5 MG tablet TAKE AS DIRECTED BY ANTICOAGULATION CLINIC 12/16/15   Midge Minium, MD    Family History Family History  Problem Relation Age of Onset  . Alcohol abuse Father   . Diabetes Father   . Obesity Brother     Social History Social History  Substance Use Topics  . Smoking status: Former Smoker    Types: Cigarettes  . Smokeless tobacco: Never Used  . Alcohol use No     Allergies   Patient has no known allergies.   Review of Systems Review of Systems  Constitutional: Negative for fever.    HENT: Negative for nosebleeds.   Eyes: Negative for redness.  Respiratory: Negative for shortness of breath.   Cardiovascular: Negative for chest pain.  Gastrointestinal: Positive for anal bleeding and hematochezia. Negative for abdominal pain.  Genitourinary: Negative for hematuria.  Musculoskeletal: Negative for back pain and neck pain.  Skin: Negative for rash.  Neurological: Negative for syncope.  Hematological: Does not bruise/bleed easily.  Psychiatric/Behavioral: Negative for confusion.     Physical Exam Updated Vital Signs Ht 5' 11.5" (1.816 m)   Wt 119.7 kg   BMI 36.31 kg/m   Physical Exam  Constitutional: He appears well-developed and well-nourished. No distress.  HENT:  Mouth/Throat: Oropharynx is clear and moist.  Eyes: Conjunctivae are normal.  Neck: Neck supple. No tracheal deviation present.  Cardiovascular: Normal rate, regular rhythm, normal heart sounds and intact distal pulses.   Pulmonary/Chest: Effort normal and breath sounds normal. No accessory muscle usage. No respiratory distress.  Abdominal: Soft. Bowel sounds are normal. He exhibits no distension. There is no tenderness.  Genitourinary:  Genitourinary Comments: Red blood on rectal exam.   Musculoskeletal: He exhibits no edema.  Neurological: He is alert.  Skin: Skin is warm and dry. No rash noted. He is not diaphoretic.  Psychiatric: He has a normal mood and affect.  Nursing note and vitals reviewed.    ED Treatments / Results  Labs (all labs ordered are listed, but only abnormal results are displayed) Results for orders placed or performed during the hospital encounter of 01/21/16  CBC  Result Value Ref Range   WBC 8.1 4.0 - 10.5 K/uL   RBC 3.71 (L) 4.22 - 5.81 MIL/uL   Hemoglobin 10.6 (L) 13.0 - 17.0 g/dL   HCT 33.8 (L) 39.0 - 52.0 %   MCV 91.1 78.0 - 100.0 fL   MCH 28.6 26.0 - 34.0 pg   MCHC 31.4 30.0 - 36.0 g/dL   RDW 14.2 11.5 - 15.5 %   Platelets 254 150 - 400 K/uL  Protime-INR   Result Value Ref Range   Prothrombin Time 35.0 (H) 11.4 - 15.2 seconds   INR 3.38   Sample to Blood Bank  Result Value Ref Range   Blood Bank Specimen BB HOLD    Sample Expiration 01/24/2016     EKG  EKG Interpretation None       Radiology No results found.  Procedures Procedures (including critical care time)  Medications Ordered in ED Medications  sodium chloride 0.9 % bolus 500 mL (not administered)     Initial Impression / Assessment and Plan / ED Course  I have reviewed the triage vital signs and the nursing notes.  Pertinent labs & imaging results that were available during my care of the patient were reviewed by me and considered in  my medical decision making (see chart for details).    Iv ns. Labs.  Reviewed nursing notes and prior charts for additional history.   Give multiple episodes blood per rectum, anticoag on  Coumadin, will admit.  Recurrent bleeding in ED, will give vit k, ffp.  Medical service consulted for admission.    Final Clinical Impressions(s) / ED Diagnoses   Final diagnoses:  None    New Prescriptions New Prescriptions   No medications on file     Lajean Saver, MD 01/21/16 [phone]

## 2016-01-21 NOTE — Progress Notes (Signed)
Patient had several stools  Today with bright red blood and clots. M.D.(GI) Is aware. Will continue to monitor.

## 2016-01-21 NOTE — H&P (Signed)
History and Physical    [name] [id number] DOB: 10-16-1931 DOA: 01/21/2016  PCP: [name], MD   Patient coming from: Home   Chief Complaint: Bright red blood per rectum.   HPI: [name] is a 83 y.o. male with medical history significant of atrial fibrillation on warfarin therapy who presents to the hospital with the chief complaint of bright red blood per rectum. For last 24 hours he has been experiencing bleeding per rectum, it has been moderate to severe intensity, it has occurred about 3 times over last 24 hours, large amounts of bright red blood, with no improving or worsening factors, no associated abdominal pain, nausea or vomiting. Denies any orthostatic symptoms, lightheadedness or dizziness, no syncope.  About 6 weeks ago he was told that his INR was about the upper limit of the target. About 9 years ago he was found to be anemic, GI workup including colonoscopy and capsule endoscopy was found to be negative. He was placed on iron supplement.  ED Course: Patient had bright red blood per rectum, his hemoglobin had dropped a positive cram from last month, elevated INR. Patient received vitamin K and fresh frozen plasma, referred for hospitalization further evaluation.   Review of Systems:  1. General. Denies fevers or chills, no waking a weight loss. 2. ENT no runny nose or sore throat 3. Pulmonary no shortness of breath cough or hemoptysis 4. Cardiovascular, no angina, claudication, no PND or orthopnea 5. Gastrointestinal positive for bleeding as mentioned in history present illness, no nausea or vomiting. No abdominal pain. No appetite loss. 6. Dermatology no rashes 7. Hematology positive easy bruisability but no frequent infections 8. Endocrine a tremors, heat or cold intolerance 9. Neurology no seizures or paresthesias 10. Urology no dysuria, or increased frequency  Past Medical History:  Diagnosis Date  . Anemia   . Atrial fibrillation ([place])   .  Blood transfusion without reported diagnosis   . Chronic constipation    colace helps  . Chronic renal insufficiency, stage II (mild)    CrCl @60 .  . Chronic venous insufficiency   . Diabetes mellitus without complication (HCC)    hx of good control  . GI bleed    slow/occult: pan-endoscopy + capsule endoscopy showed no site of bleeding.  Takes one iron tab a day and Hb has been stable.  . Gout   . Hyperlipidemia   . Hypertension   . Varicose veins of both lower extremities with complications   . Vitamin B12 deficiency     Past Surgical History:  Procedure Laterality Date  . BUNIONECTOMY  2006  . [place]  . TONSILLECTOMY AND ADENOIDECTOMY  1945     reports that he has quit smoking. His smoking use included Cigarettes. He has never used smokeless tobacco. He reports that he does not drink alcohol or use drugs.  No Known Allergies  Family History  Problem Relation Age of Onset  . Alcohol abuse Father   . Diabetes Father   . Obesity Brother    Unacceptable: Noncontributory, unremarkable, or negative. Acceptable: Family history reviewed and not pertinent (If you reviewed it)  Prior to Admission medications   Medication Sig Start Date End Date Taking? Authorizing Provider  allopurinol (ZYLOPRIM) 300 MG tablet Take 1 tablet (300 mg total) by mouth daily. 04/06/15  Yes Tammi Sou, MD  bisacodyl (DULCOLAX) 5 MG EC tablet Take 10 mg by mouth daily.   Yes Historical Provider, MD  cyanocobalamin (,VITAMIN B-12,)  1000 MCG/ML injection Inject 1 mL (1,000 mcg total) into the muscle every 30 (thirty) days. 04/02/14  Yes Tammi Sou, MD  ferrous sulfate 325 (65 FE) MG tablet Take 325 mg by mouth daily.    Yes Historical Provider, MD  lisinopril (PRINIVIL,ZESTRIL) 2.5 MG tablet Take 1 tablet (2.5 mg total) by mouth daily. 12/08/15  Yes Tammi Sou, MD  metFORMIN (GLUCOPHAGE) 500 MG tablet TAKE 1 TABLET DAILY 10/21/15  Yes Midge Minium, MD  metoprolol  succinate (TOPROL-XL) 50 MG 24 hr tablet Take 1 tablet (50 mg total) by mouth daily. 02/26/15  Yes Tammi Sou, MD  simvastatin (ZOCOR) 20 MG tablet Take 1 tablet (20 mg total) by mouth daily. 02/26/15  Yes Tammi Sou, MD  warfarin (COUMADIN) 5 MG tablet Take 5-7.5 mg by mouth daily. Takes 7.5 mg on Monday, Wednesday, and Fridays Takes 5 mg on Tuesday, Thursday, Saturday, and Sunday   Yes Historical Provider, MD  linaclotide Rolan Lipa) 145 MCG CAPS capsule Take 1 capsule (145 mcg total) by mouth daily before breakfast. Patient not taking: Reported on 01/21/2016 04/29/15   Midge Minium, MD  warfarin (COUMADIN) 5 MG tablet TAKE AS [place] Patient not taking: Reported on 01/21/2016 12/16/15   Midge Minium, MD    Physical Exam: Vitals:   01/21/16 0630 01/21/16 0800  BP:  112/64  Pulse:  74  Resp:  16  SpO2:  97%  Weight: 119.7 kg (264 lb)   Height: 5' 11.5" (1.816 m)       Constitutional: deconditioned. Vitals:   01/21/16 0630 01/21/16 0800  BP:  112/64  Pulse:  74  Resp:  16  SpO2:  97%  Weight: 119.7 kg (264 lb)   Height: 5' 11.5" (1.816 m)    Eyes: PERRL, lids and conjunctivae pale with no icterus. Head normocephalic, nose and eras no deformities.  ENMT: Mucous membranes are dry. Posterior pharynx clear of any exudate or lesions.Normal dentition.  Neck: normal, supple, no masses, no thyromegaly Respiratory: clear to auscultation bilaterally, no wheezing, no crackles. Normal respiratory effort. No accessory muscle use.  Cardiovascular: Regular rate and rhythm, no murmurs / rubs / gallops. + pitting lower extremity edema. 2+ pedal pulses. No carotid bruits.  Abdomen: mild distended, with increased bowel sounds, no tenderness, no masses palpated. No hepatosplenomegaly.  Musculoskeletal: no clubbing / cyanosis. No joint deformity upper and lower extremities. Good ROM, no contractures. Normal muscle tone.  Skin: no rashes, lesions,  ulcers. No induration Neurologic: CN 2-12 grossly intact. Sensation intact, DTR normal. Strength 5/5 in all 4.    Labs on Admission: I have personally reviewed following labs and imaging studies  CBC:  Recent Labs Lab 01/21/16 0750  WBC 8.1  HGB 10.6*  HCT 33.8*  MCV 91.1  PLT 0000000   Basic Metabolic Panel:  Recent Labs Lab 01/21/16 0750  NA 138  K 4.2  CL 109  CO2 25  GLUCOSE 141*  BUN 16  CREATININE 1.19  CALCIUM 8.4*   GFR: Estimated Creatinine Clearance: 63.5 mL/min (by C-G formula based on SCr of 1.19 mg/dL). Liver Function Tests: No results for input(s): AST, ALT, ALKPHOS, BILITOT, PROT, ALBUMIN in the last 168 hours. No results for input(s): LIPASE, AMYLASE in the last 168 hours. No results for input(s): AMMONIA in the last 168 hours. Coagulation Profile:  Recent Labs Lab 01/21/16 0750  INR 3.38   Cardiac Enzymes: No results for input(s): CKTOTAL, CKMB, CKMBINDEX, TROPONINI in the  last 168 hours. BNP (last 3 results) No results for input(s): PROBNP in the last 8760 hours. HbA1C: No results for input(s): HGBA1C in the last 72 hours. CBG: No results for input(s): GLUCAP in the last 168 hours. Lipid Profile: No results for input(s): CHOL, HDL, LDLCALC, TRIG, CHOLHDL, LDLDIRECT in the last 72 hours. Thyroid Function Tests: No results for input(s): TSH, T4TOTAL, FREET4, T3FREE, THYROIDAB in the last 72 hours. Anemia Panel: No results for input(s): VITAMINB12, FOLATE, FERRITIN, TIBC, IRON, RETICCTPCT in the last 72 hours. Urine analysis: No results found for: COLORURINE, APPEARANCEUR, LABSPEC, PHURINE, GLUCOSEU, HGBUR, BILIRUBINUR, KETONESUR, PROTEINUR, UROBILINOGEN, NITRITE, LEUKOCYTESUR Sepsis Labs: !!!!!!!!!!!!!!!!!!!!!!!!!!!!!!!!!!!!!!!!!!!! @LABRCNTIP (procalcitonin:4,lacticidven:4) )No results found for this or any previous visit (from the past 240 hour(s)).   Radiological Exams on Admission: No results found.  EKG: Independently reviewed.  NA  Assessment/Plan Active Problems:   Rectal bleeding   Acute lower GI bleeding   This is an 83 year old male with significant history for atrial fibrillation chronically anticoagulated with warfarin, presents to hospital with bright red blood per rectum. Bleeding has been persistent, moderate to severe intensity. No orthostatic symptoms. Patient is on beta blockade. On his initial physical examination, blood pressure 115/71, heart rate 72, respiratory 21, oxygen saturation 97% on room air. He is pale, his abdomen is moderately distended with increased bowel sounds. Positive lower extremity edema. Sodium 138, potassium 4.2, chloride 109, bicarbonate 25, glucose 141, BUN 16, creatinine 1.19, calcium 8.4, white count 8.1, Hb 10.6 dropped from 11.6 last month, hematocrit 33.8, platelets 254. INR 3.38 from 3. 12/2015. Positive fecal blood.  The patient will be admitted to the hospital with the working diagnosis of acute blood loss anemia due to acute lower GI bleed.  1. Acute blood loss anemia due to acute lower GI bleed. The patient will be admitted to the medical floor with a telemetry monitor, will check H&H every 6 hours, will transfuse once hemoglobin is less than 7 or in the case of massive bleeding with rapidly decreasing hemoglobin levels. Patient has received vitamin K and will receive fresh frozen plasma in the emergency department, will follow-up INR in the morning. Will add Protonix to his medical regimen. Colonoscopy from 2003, (old records personally reviewed) reported as sigmoid polyp with tubular adenoma, rectal polyp with hyperplasia. Upper endoscopy positive for gastritis and H. Pylori. [facility] gastroenterology). Gastrointestinal service will be reconsulted, patient may need endoscopic procedure on this admission.  2. Chronic atrial fibrillation. Continue rate control with metoprolol succinate 50 mg daily, hold anticoagulation. Currently patient euvolemic, no signs of heart  failure.  3. Hypertension. Will hold on lisinopril for now to avoid hypotension, follow-up on blood pressure monitoring.  4. Gout. Chronic and stable, no signs of exacerbation, continue allopurinol 300 mg daily  5. Chronic iron deficiency anemia. Continue iron supplements. Will recheck panel on this admission.  6. Type 2 diabetes mellitus. Will hold on metformin, will continue insulin coverage and monitoring with insulin sliding-scale.    DVT prophylaxis: SCD Code Status: Full  Family Communication: I spoke with patient's daughter at the bedside and all questions were addressed.  Disposition Plan: Home Consults called: gastroenterology  Admission status:  Inpatient  Dominika Losey Gerome Apley MD [facility] Pager [phone]  If 7PM-7AM, please contact night-coverage [email or web address] Password [facility]  01/21/2016, 9:24 AM

## 2016-01-21 NOTE — ED Triage Notes (Addendum)
Pt reports having 2 - 3 bloody stools over the last 2 days on EMS arrival A&O x4  And ambulatory at scene pt has Hx of Afib and is taking coumadin. Pt reports he skipped his last dose due to bleeding but usually take 5 mg warfrin 5 days a week and 7.5 mg  Monday Wednesday and Friday.

## 2016-01-21 NOTE — Plan of Care (Signed)
Problem: Education: Goal: Knowledge of [facility] Education information/materials will improve Outcome: Progressing Discussed current plan of care with pt. Will continue to provide education

## 2016-01-21 NOTE — Consult Note (Signed)
[name] 83 y.o. 1931-03-31 [id number]    Subjective:   Chief Complaint: Bright red blood per rectum x 2 days  HPI This is a pleasant 83 year old male with PMHx of atrial fibrillation on warfarin therapy, hemorrhoids, and anemia who presented to Emergency Dept on 01/21/16 after having 4 episodes of passing bright red blood per rectum beginning 01/20/16 at 7 am.  He described waking up saturated in blood during the first occurrence.  He states he passed blood several times since arriving to the hospital, with the last episode occurring around 12:30 PM.  He began feeling weak this afternoon but has felt "normal" until then.  Admits to easy bruising but states this has been an issue since beginning Warfarin.  Admits to chronic constipation controlled with Dulcolax.  Denies unintentional weight loss, cough, hemoptysis, dizziness, syncope, abdominal pain, nausea, vomiting, diarrhea, and fever.  Denies NSAID, alcohol, and nicotine use.   His previous Warfarin dose was 5 mg five days/week and 7.5 mg two days/week.  The dose was increased to 5 mg four days/week and 7.5 mg MWF.  Last dose was taken on 01/19/16.  In 2003, he had a colonoscopy and endoscopy with bx to evaluate microcytic anemia which found Grade 3 internal and external hemorrhoids, as well as a hyperplastic rectal polyp and a tubular adenoma in the sigmoid colon.  Gastritis and H. Pylori were also seen.  He states he also had a negative capsule endoscopy.  ED course: Patient arrived to ED with bright red blood per rectum.  He received Vit K as his hemoglobin was 9.9g/dl and did not receive FFP.  Baseline hgb [place] 12.5-13g/dl.  His PT was 34.0 and INR was 3.26.  Past Medical History:  Diagnosis Date  . Anemia   . Atrial fibrillation ([place])   . Blood transfusion without reported diagnosis   . Chronic constipation    colace helps  . Chronic renal insufficiency, stage II (mild)    CrCl @60 .  . Chronic venous insufficiency   .  Diabetes mellitus without complication (HCC)    hx of good control  . GI bleed    slow/occult: pan-endoscopy + capsule endoscopy showed no site of bleeding.  Takes one iron tab a day and Hb has been stable.  . Gout   . Hyperlipidemia   . Hypertension   . Varicose veins of both lower extremities with complications   . Vitamin B12 deficiency     Past Surgical History:  Procedure Laterality Date  . BUNIONECTOMY  2006  . [place]  . TONSILLECTOMY AND ADENOIDECTOMY  1945    Current Meds  Medication Sig  . allopurinol (ZYLOPRIM) 300 MG tablet Take 1 tablet (300 mg total) by mouth daily.  . bisacodyl (DULCOLAX) 5 MG EC tablet Take 10 mg by mouth daily.  . cyanocobalamin (,VITAMIN B-12,) 1000 MCG/ML injection Inject 1 mL (1,000 mcg total) into the muscle every 30 (thirty) days.  . ferrous sulfate 325 (65 FE) MG tablet Take 325 mg by mouth daily.   Marland Kitchen lisinopril (PRINIVIL,ZESTRIL) 2.5 MG tablet Take 1 tablet (2.5 mg total) by mouth daily.  . metFORMIN (GLUCOPHAGE) 500 MG tablet TAKE 1 TABLET DAILY  . metoprolol succinate (TOPROL-XL) 50 MG 24 hr tablet Take 1 tablet (50 mg total) by mouth daily.  . simvastatin (ZOCOR) 20 MG tablet Take 1 tablet (20 mg total) by mouth daily.  Marland Kitchen warfarin (COUMADIN) 5 MG tablet Take 5-7.5 mg by mouth daily. Takes  7.5 mg on Monday, Wednesday, and Fridays Takes 5 mg on Tuesday, Thursday, Saturday, and Sunday     Current Facility-Administered Medications:  .  0.9 %  sodium chloride infusion, 10 mL/hr, Intravenous, Once, Lajean Saver, MD .  0.9 %  sodium chloride infusion, , Intravenous, STAT, Lajean Saver, MD, Last Rate: 125 mL/hr at 01/21/16 0907 .  0.9 %  sodium chloride infusion, 250 mL, Intravenous, PRN, Tawni Millers, MD .  acetaminophen (TYLENOL) tablet 650 mg, 650 mg, Oral, Q6H PRN **OR** acetaminophen (TYLENOL) suppository 650 mg, 650 mg, Rectal, Q6H PRN, Tawni Millers, MD .  allopurinol (ZYLOPRIM) tablet 300 mg, 300 mg,  Oral, Daily, Mauricio Gerome Apley, MD, 300 mg at 01/21/16 1155 .  cyanocobalamin ((VITAMIN B-12)) injection 1,000 mcg, 1,000 mcg, Intramuscular, Q30 days, Tawni Millers, MD .  ferrous sulfate tablet 325 mg, 325 mg, Oral, Daily, Mauricio Gerome Apley, MD, 325 mg at 01/21/16 1155 .  insulin aspart (novoLOG) injection 0-9 Units, 0-9 Units, Subcutaneous, TID WC, Mauricio Gerome Apley, MD .  metoprolol succinate (TOPROL-XL) 24 hr tablet 50 mg, 50 mg, Oral, Daily, Mauricio Gerome Apley, MD, 50 mg at 01/21/16 1155 .  ondansetron (ZOFRAN) tablet 4 mg, 4 mg, Oral, Q6H PRN **OR** ondansetron (ZOFRAN) injection 4 mg, 4 mg, Intravenous, Q6H PRN, Tawni Millers, MD .  pantoprazole (PROTONIX) EC tablet 40 mg, 40 mg, Oral, BID, Mauricio Gerome Apley, MD, 40 mg at 01/21/16 1155 .  simvastatin (ZOCOR) tablet 20 mg, 20 mg, Oral, Daily, Mauricio Gerome Apley, MD, 20 mg at 01/21/16 1155 .  sodium chloride flush (NS) 0.9 % injection 3 mL, 3 mL, Intravenous, Q12H, Mauricio Daniel Arrien, MD .  sodium chloride flush (NS) 0.9 % injection 3 mL, 3 mL, Intravenous, PRN, Tawni Millers, MD   Allergies  Allergen Reactions  . Influenza Vac Split [Flu Virus Vaccine] Nausea And Vomiting    Social History   Social History  . Marital status: Married    Spouse name: N/A  . Number of children: N/A  . Years of education: N/A   Occupational History  . Not on file.   Social History Main Topics  . Smoking status: Former Smoker    Types: Cigarettes  . Smokeless tobacco: Never Used  . Alcohol use No  . Drug use: No  . Sexual activity: Not on file   Other Topics Concern  . Not on file   Social History Narrative   Married, 3 daughters, 4 grandchildren.      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   Retired 2012.      Remote smoking hx: quit 55 yrs ago.  No alcohol in 55 yrs.          Review of Systems All other ROS  negative except for stated in HPI  Objective:   Physical Exam BP 106/62 (BP Location: Left Arm)   Pulse 60   Temp 98 F (36.7 C) (Oral)   Resp 18   Ht 5\' 11"  (1.803 m)   Wt 258 lb 4.8 oz (117.2 kg)   SpO2 100%   BMI 36.03 kg/m   GA: Well developed, well nourished male, appears stated age, in no acute distress HEENT: Head NCAT, conjunctiva pink, anicteric, mucous membranes dry, no erythema posterior pharynx, symmetrical rise of uvula Neck: Supple, no masses, no lymphadenopathy Lungs; CTAB, no wheezes, rales, ronchi  Cardiovascular: RRR, no murmurs, rubs, gallops, trace LE edema left leg  Abdomen: Mildly increased bowel sounds, soft, no tenderness, no masses, no hepatosplenomegaly Rectal: Deferred  Neuro: Alert and oriented x 3 Psych: Normal mood and affect  Skin: Small bruises on left forearm    Assessment & Plan:  1. Acute blood loss anemia due to acute lower GI bleed Diverticular bleed vs AVM.  Malignancy less likely given clinical presentation.  FFP on hold for now per hospitalist.  Continue to monitor hemoglobin and continue Protonix.    2. Chronic atrial fibrillation Continue rate control with metoprolol succinate 50 mg daily, hold warfarin.  INR 3.26.  Vit K given in ED.  Continue to monitor coags.  3. Chronic iron deficiency anemia Continue iron supplements  Plan: - NPO - Will order CTA to assess for source of bleeding.  Kidney function stable.  - If positive CTA, please contact IR to embolize bleeding - If negative CTA, will order slow bowel prep and possible colonoscopy tomorrow   DVT prophylaxis: SCD Code Status: Full

## 2016-01-21 NOTE — Plan of Care (Signed)
Problem: Safety: Goal: Ability to remain free from injury will improve Outcome: Progressing discussed safety plan. Pt is not agreeable at this time. Will continue to provide safety education

## 2016-01-21 NOTE — Progress Notes (Signed)
Discussed safety measures and plan with patient and daughter. Patient encouraged to use beside commode d/t increased amount of blood loss. Patient is not agreeable at this time and prefers to use bathroom toilet. Will continue to provide education regarding GI bleeding and risks.

## 2016-01-21 NOTE — Progress Notes (Signed)
Spoke to hospitalist provider, platelets are on hold for now. Will continue to monitor pt.

## 2016-01-21 NOTE — Plan of Care (Signed)
Problem: Education: Goal: Knowledge of [facility] Education information/materials will improve Outcome: Completed/Met Date Met: 01/21/16 Discussed with patient and daughter debbie. Pt and daughter verbalized understanding of continued treatment plan

## 2016-01-22 ENCOUNTER — Encounter (HOSPITAL_COMMUNITY)
Admission: EM | Disposition: A | Discharge status: Home / Self Care | Payer: Self-pay | Source: Home / Self Care | Attending: Internal Medicine

## 2016-01-22 ENCOUNTER — Encounter (HOSPITAL_COMMUNITY): Payer: Self-pay | Admitting: *Deleted

## 2016-01-22 DIAGNOSIS — K626 Ulcer of anus and rectum: Secondary | ICD-10-CM

## 2016-01-22 DIAGNOSIS — D500 Iron deficiency anemia secondary to blood loss (chronic): Secondary | ICD-10-CM

## 2016-01-22 DIAGNOSIS — E119 Type 2 diabetes mellitus without complications: Secondary | ICD-10-CM

## 2016-01-22 DIAGNOSIS — D120 Benign neoplasm of cecum: Secondary | ICD-10-CM

## 2016-01-22 DIAGNOSIS — K625 Hemorrhage of anus and rectum: Secondary | ICD-10-CM

## 2016-01-22 DIAGNOSIS — I10 Essential (primary) hypertension: Secondary | ICD-10-CM

## 2016-01-22 DIAGNOSIS — I482 Chronic atrial fibrillation: Secondary | ICD-10-CM

## 2016-01-22 DIAGNOSIS — D122 Benign neoplasm of ascending colon: Secondary | ICD-10-CM

## 2016-01-22 DIAGNOSIS — E785 Hyperlipidemia, unspecified: Secondary | ICD-10-CM

## 2016-01-22 HISTORY — PX: COLONOSCOPY: SHX5424

## 2016-01-22 LAB — PROTIME-INR
INR: 1.35
PROTHROMBIN TIME: 16.7 s — AB (ref 11.4–15.2)

## 2016-01-22 LAB — GLUCOSE, CAPILLARY
GLUCOSE-CAPILLARY: 117 mg/dL — AB (ref 65–99)
Glucose-Capillary: 113 mg/dL — ABNORMAL HIGH (ref 65–99)
Glucose-Capillary: 165 mg/dL — ABNORMAL HIGH (ref 65–99)
Glucose-Capillary: 118 mg/dL — ABNORMAL HIGH (ref 65–99)

## 2016-01-22 LAB — CBC
HCT: 25.9 % — ABNORMAL LOW (ref 39.0–52.0)
Hemoglobin: 8.4 g/dL — ABNORMAL LOW (ref 13.0–17.0)
MCH: 29.6 pg (ref 26.0–34.0)
MCHC: 32.4 g/dL (ref 30.0–36.0)
MCV: 91.2 fL (ref 78.0–100.0)
PLATELETS: 227 10*3/uL (ref 150–400)
RBC: 2.84 MIL/uL — ABNORMAL LOW (ref 4.22–5.81)
RDW: 14.3 % (ref 11.5–15.5)
WBC: 6.6 10*3/uL (ref 4.0–10.5)

## 2016-01-22 SURGERY — COLONOSCOPY
Anesthesia: Monitor Anesthesia Care

## 2016-01-22 MED ORDER — MIDAZOLAM HCL 5 MG/ML IJ SOLN
INTRAMUSCULAR | Status: AC
  Filled 2016-01-22: qty 2

## 2016-01-22 MED ORDER — FENTANYL CITRATE (PF) 100 MCG/2ML IJ SOLN
INTRAMUSCULAR | Status: AC
  Filled 2016-01-22: qty 2

## 2016-01-22 MED ORDER — FENTANYL CITRATE (PF) 100 MCG/2ML IJ SOLN
INTRAMUSCULAR | Status: DC | PRN
  Administered 2016-01-22 (×3): 25 ug via INTRAVENOUS

## 2016-01-22 MED ORDER — POLYETHYLENE GLYCOL 3350 17 G PO PACK
17.0000 g | PACK | Freq: Every day | ORAL | Status: DC
  Administered 2016-01-22 – 2016-01-23 (×2): 17 g via ORAL
  Filled 2016-01-22 (×2): qty 1

## 2016-01-22 MED ORDER — MIDAZOLAM HCL 5 MG/5ML IJ SOLN
INTRAMUSCULAR | Status: DC | PRN
  Administered 2016-01-22 (×2): 1 mg via INTRAVENOUS

## 2016-01-22 MED ORDER — SODIUM CHLORIDE 0.9 % IV SOLN
INTRAVENOUS | Status: DC
  Administered 2016-01-22: 500 mL via INTRAVENOUS

## 2016-01-22 NOTE — Interval H&P Note (Signed)
History and Physical Interval Note:  01/22/2016 9:20 AM  [name]  has presented today for surgery, with the diagnosis of lower GI bleed  The various methods of treatment have been discussed with the patient and family. After consideration of risks, benefits and other options for treatment, the patient has consented to  Procedure(s): COLONOSCOPY (N/A) as a surgical intervention .  The patient's history has been reviewed, patient examined, no change in status, stable for surgery.  I have reviewed the patient's chart and labs.  Questions were answered to the patient's satisfaction.     Renelda Loma Armbruster

## 2016-01-22 NOTE — H&P (View-Only) (Signed)
[name] 83 y.o. 02-Nov-1931 [id number]    Subjective:   Chief Complaint: Bright red blood per rectum x 2 days  HPI This is a pleasant 83 year old male with PMHx of atrial fibrillation on warfarin therapy, hemorrhoids, and anemia who presented to Emergency Dept on 01/21/16 after having 4 episodes of passing bright red blood per rectum beginning 01/20/16 at 7 am.  He described waking up saturated in blood during the first occurrence.  He states he passed blood several times since arriving to the hospital, with the last episode occurring around 12:30 PM.  He began feeling weak this afternoon but has felt "normal" until then.  Admits to easy bruising but states this has been an issue since beginning Warfarin.  Admits to chronic constipation controlled with Dulcolax.  Denies unintentional weight loss, cough, hemoptysis, dizziness, syncope, abdominal pain, nausea, vomiting, diarrhea, and fever.  Denies NSAID, alcohol, and nicotine use.   His previous Warfarin dose was 5 mg five days/week and 7.5 mg two days/week.  The dose was increased to 5 mg four days/week and 7.5 mg MWF.  Last dose was taken on 01/19/16.  In 2003, he had a colonoscopy and endoscopy with bx to evaluate microcytic anemia which found Grade 3 internal and external hemorrhoids, as well as a hyperplastic rectal polyp and a tubular adenoma in the sigmoid colon.  Gastritis and H. Pylori were also seen.  He states he also had a negative capsule endoscopy.  ED course: Patient arrived to ED with bright red blood per rectum.  He received Vit K as his hemoglobin was 9.9g/dl and did not receive FFP.  Baseline hgb [place] 12.5-13g/dl.  His PT was 34.0 and INR was 3.26.  Past Medical History:  Diagnosis Date  . Anemia   . Atrial fibrillation ([place])   . Blood transfusion without reported diagnosis   . Chronic constipation    colace helps  . Chronic renal insufficiency, stage II (mild)    CrCl @60 .  . Chronic venous insufficiency   .  Diabetes mellitus without complication (HCC)    hx of good control  . GI bleed    slow/occult: pan-endoscopy + capsule endoscopy showed no site of bleeding.  Takes one iron tab a day and Hb has been stable.  . Gout   . Hyperlipidemia   . Hypertension   . Varicose veins of both lower extremities with complications   . Vitamin B12 deficiency     Past Surgical History:  Procedure Laterality Date  . BUNIONECTOMY  2006  . [place]  . TONSILLECTOMY AND ADENOIDECTOMY  1945    Current Meds  Medication Sig  . allopurinol (ZYLOPRIM) 300 MG tablet Take 1 tablet (300 mg total) by mouth daily.  . bisacodyl (DULCOLAX) 5 MG EC tablet Take 10 mg by mouth daily.  . cyanocobalamin (,VITAMIN B-12,) 1000 MCG/ML injection Inject 1 mL (1,000 mcg total) into the muscle every 30 (thirty) days.  . ferrous sulfate 325 (65 FE) MG tablet Take 325 mg by mouth daily.   Marland Kitchen lisinopril (PRINIVIL,ZESTRIL) 2.5 MG tablet Take 1 tablet (2.5 mg total) by mouth daily.  . metFORMIN (GLUCOPHAGE) 500 MG tablet TAKE 1 TABLET DAILY  . metoprolol succinate (TOPROL-XL) 50 MG 24 hr tablet Take 1 tablet (50 mg total) by mouth daily.  . simvastatin (ZOCOR) 20 MG tablet Take 1 tablet (20 mg total) by mouth daily.  Marland Kitchen warfarin (COUMADIN) 5 MG tablet Take 5-7.5 mg by mouth daily. Takes  7.5 mg on Monday, Wednesday, and Fridays Takes 5 mg on Tuesday, Thursday, Saturday, and Sunday     Current Facility-Administered Medications:  .  0.9 %  sodium chloride infusion, 10 mL/hr, Intravenous, Once, Lajean Saver, MD .  0.9 %  sodium chloride infusion, , Intravenous, STAT, Lajean Saver, MD, Last Rate: 125 mL/hr at 01/21/16 0907 .  0.9 %  sodium chloride infusion, 250 mL, Intravenous, PRN, Tawni Millers, MD .  acetaminophen (TYLENOL) tablet 650 mg, 650 mg, Oral, Q6H PRN **OR** acetaminophen (TYLENOL) suppository 650 mg, 650 mg, Rectal, Q6H PRN, Tawni Millers, MD .  allopurinol (ZYLOPRIM) tablet 300 mg, 300 mg,  Oral, Daily, Mauricio Gerome Apley, MD, 300 mg at 01/21/16 1155 .  cyanocobalamin ((VITAMIN B-12)) injection 1,000 mcg, 1,000 mcg, Intramuscular, Q30 days, Tawni Millers, MD .  ferrous sulfate tablet 325 mg, 325 mg, Oral, Daily, Mauricio Gerome Apley, MD, 325 mg at 01/21/16 1155 .  insulin aspart (novoLOG) injection 0-9 Units, 0-9 Units, Subcutaneous, TID WC, Mauricio Gerome Apley, MD .  metoprolol succinate (TOPROL-XL) 24 hr tablet 50 mg, 50 mg, Oral, Daily, Mauricio Gerome Apley, MD, 50 mg at 01/21/16 1155 .  ondansetron (ZOFRAN) tablet 4 mg, 4 mg, Oral, Q6H PRN **OR** ondansetron (ZOFRAN) injection 4 mg, 4 mg, Intravenous, Q6H PRN, Tawni Millers, MD .  pantoprazole (PROTONIX) EC tablet 40 mg, 40 mg, Oral, BID, Mauricio Gerome Apley, MD, 40 mg at 01/21/16 1155 .  simvastatin (ZOCOR) tablet 20 mg, 20 mg, Oral, Daily, Mauricio Gerome Apley, MD, 20 mg at 01/21/16 1155 .  sodium chloride flush (NS) 0.9 % injection 3 mL, 3 mL, Intravenous, Q12H, Mauricio Daniel Arrien, MD .  sodium chloride flush (NS) 0.9 % injection 3 mL, 3 mL, Intravenous, PRN, Tawni Millers, MD   Allergies  Allergen Reactions  . Influenza Vac Split [Flu Virus Vaccine] Nausea And Vomiting    Social History   Social History  . Marital status: Married    Spouse name: N/A  . Number of children: N/A  . Years of education: N/A   Occupational History  . Not on file.   Social History Main Topics  . Smoking status: Former Smoker    Types: Cigarettes  . Smokeless tobacco: Never Used  . Alcohol use No  . Drug use: No  . Sexual activity: Not on file   Other Topics Concern  . Not on file   Social History Narrative   Married, 3 daughters, 4 grandchildren.      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   Retired 2012.      Remote smoking hx: quit 55 yrs ago.  No alcohol in 55 yrs.          Review of Systems All other ROS  negative except for stated in HPI  Objective:   Physical Exam BP 106/62 (BP Location: Left Arm)   Pulse 60   Temp 98 F (36.7 C) (Oral)   Resp 18   Ht 5\' 11"  (1.803 m)   Wt 258 lb 4.8 oz (117.2 kg)   SpO2 100%   BMI 36.03 kg/m   GA: Well developed, well nourished male, appears stated age, in no acute distress HEENT: Head NCAT, conjunctiva pink, anicteric, mucous membranes dry, no erythema posterior pharynx, symmetrical rise of uvula Neck: Supple, no masses, no lymphadenopathy Lungs; CTAB, no wheezes, rales, ronchi  Cardiovascular: RRR, no murmurs, rubs, gallops, trace LE edema left leg  Abdomen: Mildly increased bowel sounds, soft, no tenderness, no masses, no hepatosplenomegaly Rectal: Deferred  Neuro: Alert and oriented x 3 Psych: Normal mood and affect  Skin: Small bruises on left forearm    Assessment & Plan:  1. Acute blood loss anemia due to acute lower GI bleed Diverticular bleed vs AVM.  Malignancy less likely given clinical presentation.  FFP on hold for now per hospitalist.  Continue to monitor hemoglobin and continue Protonix.    2. Chronic atrial fibrillation Continue rate control with metoprolol succinate 50 mg daily, hold warfarin.  INR 3.26.  Vit K given in ED.  Continue to monitor coags.  3. Chronic iron deficiency anemia Continue iron supplements  Plan: - NPO - Will order CTA to assess for source of bleeding.  Kidney function stable.  - If positive CTA, please contact IR to embolize bleeding - If negative CTA, will order slow bowel prep and possible colonoscopy tomorrow   DVT prophylaxis: SCD Code Status: Full

## 2016-01-22 NOTE — Op Note (Signed)
[facility] Patient Name: [name] Procedure Date: 01/22/2016 MRN: [id number] Attending MD: Carlota Raspberry. Keevin Panebianco MD, MD Date of Birth: 18-Mar-1931 CSN: [id number] Age: 83 Admit Type: Inpatient Procedure:                Colonoscopy Indications:              Hematochezia, negative CT angio, last colonoscopy                            15 years ago Providers:                Remo Lipps P. Juhi Lagrange MD, MD, Cleda Daub, RN,                            William Dalton, Technician Referring MD:              Medicines:                Fentanyl 75 micrograms IV, Midazolam 3 mg IV Complications:            No immediate complications. Estimated blood loss:                            Minimal. Estimated Blood Loss:     Estimated blood loss was minimal. Procedure:                Pre-Anesthesia Assessment:                           - Prior to the procedure, a History and Physical                            was performed, and patient medications and                            allergies were reviewed. The patient's tolerance of                            previous anesthesia was also reviewed. The risks                            and benefits of the procedure and the sedation                            options and risks were discussed with the patient.                            All questions were answered, and informed consent                            was obtained. Prior Anticoagulants: The patient has                            taken Coumadin (warfarin), last dose was 2 days  prior to procedure. ASA Grade Assessment: III - A                            patient with severe systemic disease. After                            reviewing the risks and benefits, the patient was                            deemed in satisfactory condition to undergo the                            procedure.                           After obtaining informed consent, the colonoscope                             was passed under direct vision. Throughout the                            procedure, the patient's blood pressure, pulse, and                            oxygen saturations were monitored continuously. The                            EC-[id number] scope was introduced through                            the anus and advanced to the the terminal ileum,                            with identification of the appendiceal orifice and                            IC valve. The colonoscopy was performed without                            difficulty. The patient tolerated the procedure                            well. The quality of the bowel preparation was                            adequate. The terminal ileum, ileocecal valve,                            appendiceal orifice, and rectum were photographed. Scope In: 9:39:33 AM Scope Out: 10:01:50 AM Scope Withdrawal Time: 0 hours 17 minutes 13 seconds  Total Procedure Duration: 0 hours 22 minutes 17 seconds  Findings:      The perianal exam findings include mucosa felt "rough" and abnormal to       palpation.  A 6 mm polyp was found in the cecum. The polyp was sessile. The polyp       was removed with a cold snare. Resection and retrieval were complete.      A 6 mm polyp was found in the ascending colon. The polyp was sessile.       The polyp was removed with a cold snare. Resection and retrieval were       complete.      Scattered medium-mouthed diverticula were found in the left colon.      A single (solitary) roughly 30 mm ulcer was found in the distal rectum.       Stigmata of recent bleeding were present with a visible vessel.       Fulguration to ablate the lesion by argon plasma was successful.       Biopsies were taken with a cold forceps for histology along the       periphery of the ulcer. At the most distal aspect was a nodular whitish       appearing plaque attached to it. Multiple biopsies  obtained.      The terminal ileum appeared normal.      The exam was otherwise without abnormality. Impression:               - Mucosa felt "rough" and abnormal to palpation.                            found on perianal exam.                           - One 6 mm polyp in the cecum, removed with a cold                            snare. Resected and retrieved.                           - One 6 mm polyp in the ascending colon, removed                            with a cold snare. Resected and retrieved.                           - Diverticulosis in the left colon.                           - A single (solitary) ulcer in the distal rectum                            with visible vessel, the likely cause of symptoms.                            Treated with argon plasma coagulation (APC).                            Biopsied.                           - The examined portion of the  ileum was normal.                           - The examination was otherwise normal. Moderate Sedation:      Moderate (conscious) sedation was administered by the endoscopy nurse       and supervised by the endoscopist. The following parameters were       monitored: oxygen saturation, heart rate, blood pressure, and response       to care. Total physician intraservice time was 22 minutes. Recommendation:           - Return patient to hospital ward for ongoing care.                           - Resume regular diet.                           - Continue present medications.                           - Hold coumadin for today                           - Miralax twice daily                           - Consideration for anorectal manometry and pelvic                            floor PT if underlying constipation                           - Discharge tomorrow if no further symptoms                           - Await pathology results                           - GI service will follow along Procedure Code(s):        ---  Professional ---                           [phone], 59, Colonoscopy, flexible; with control of                            bleeding, any method                           45385, Colonoscopy, flexible; with removal of                            tumor(s), polyp(s), or other lesion(s) by snare                            technique                           99152, Moderate sedation services provided by the  same physician or other qualified health care                            professional performing the diagnostic or                            therapeutic service that the sedation supports,                            requiring the presence of an independent trained                            observer to assist in the monitoring of the                            patient's level of consciousness and physiological                            status; initial 15 minutes of intraservice time,                            patient age 23 years or older Diagnosis Code(s):        --- Professional ---                           D12.0, Benign neoplasm of cecum                           D12.2, Benign neoplasm of ascending colon                           K62.6, Ulcer of anus and rectum                           K92.1, Melena (includes Hematochezia)                           K57.30, Diverticulosis of large intestine without                            perforation or abscess without bleeding CPT copyright 2016 [employer]. All rights reserved. The codes documented in this report are preliminary and upon coder review may  be revised to meet current compliance requirements. Remo Lipps P. Cane Dubray MD, MD 01/22/2016 10:22:16 AM This report has been signed electronically. Number of Addenda: 0

## 2016-01-22 NOTE — Progress Notes (Addendum)
PROGRESS NOTE    [name]  [id number] DOB: 1931/07/15 DOA: 01/21/2016 PCP: [name], MD   Brief Narrative:  [name] is a 83 y.o. male with medical history significant of atrial fibrillation on warfarin therapy who presents to the hospital with the chief complaint of bright red blood per rectum. For last 24 hours he has been experiencing bleeding per rectum, it has been moderate to severe intensity, it has occurred about 3 times over last 24 hours, large amounts of bright red blood, with no improving or worsening factors, no associated abdominal pain, nausea or vomiting. Denied any orthostatic symptoms, lightheadedness or dizziness, no syncope. Worked up and admitted for Lower GI bleed and Acute Blood Loss anemia and underwent colonoscopy which showed 2 polyps, diverticulosis, and distal rectal ulcer that was suspected to be the source of bleed.   Assessment & Plan:   Principal Problem:   Acute lower GI bleeding Active Problems:   Hyperlipidemia   ATRIAL FIBRILLATION   Diabetes mellitus without complication (HCC)   Iron deficiency anemia due to chronic blood loss   Essential hypertension   Rectal bleeding   Warfarin-induced coagulopathy (HCC)  Acute Lower GI Bleed/BRBPR in the setting of Supratherapeutic INR -Hb/Hct went from 10.6/33.8 -> 8.4/25.9; Baseline Hb around 12.5-13.0 -Had CTA done which snowed Diverticulosis but no active bleeding -Had Bowel Prep last night and S/p Colonoscopy this AM and found 2 6 mm polyps (1 in Ascending colon and 1 in cecum that was resected and retrieved with cold snare), Diverticulosis in Left Colon and Single Rectal Ulcer with visible vessel that was treated with Argon Plasma coagulation -Resume Regular Diet per GI and Hold Coumadin for today -Consideration of Anorectal Manometry and Pelvic floor PT if Underlying Constipation -C/w Miralax BID -Await Current Pathology results -Possible D/C Home tomorrow -Appreciate  Gastroenterology Recc's and Evaluation for further Management  -Repeat CBC in AM   Acute Blood Loss Anemia  -Hb/Hct went from 10.6/33.8 -> 8.4/25.9; Baseline Hb around 12.5-13.0 -Continue Ferrous Sulfate Tablets 325 mg po Daily -As Above -Repeat CBC in AM  Atrial Fibrillation -Coumadin Held  -INR was Supratherapeutic at 3.38 and was reversed with Vitamin K and FFP; Repeat INR this AM 1.35 -Continue Rate Control with Metoprolol Succinate 50 mg po Daily -Repeat INR in AM  Gout -Chronic and Stable -Continue Allopurinol 300 mg po Daily  Hypertension -Hold Lisinopril 2.5 mg po Daily -C/w Metoprolol Succinate 50 mg po Daily -Continue to Monitor Vital Signs Closely  Hyperlipidemia -C/w Simvastatin 20 mg po Daily  Diabetes Mellitus Type 2 -Hold home Metformin -C/w Sensitive Novolog SSI -CBG's ranging from 113-117 -Continue to Monitor  GERD -C/w Pantoprazole 40 mg po BID  DVT prophylaxis: SCDs Code Status: FULL CODE Family Communication: Daughter at bedside Disposition Plan: Possible D/C Home in AM if stable  Consultants:   Gastroenterology  Procedures: Colonoscopy -      - Mucosa felt "rough" and abnormal to palpation.                            found on perianal exam.                           - One 6 mm polyp in the cecum, removed with a cold  snare. Resected and retrieved.                           - One 6 mm polyp in the ascending colon, removed                            with a cold snare. Resected and retrieved.                           - Diverticulosis in the left colon.                           - A single (solitary) ulcer in the distal rectum                            with visible vessel, the likely cause of symptoms.                            Treated with argon plasma coagulation (APC).                            Biopsied.                           - The examined portion of the ileum was normal.                           - The  examination was otherwise normal.  Antimicrobials: None  Subjective: Seen and examined at bedside after colonoscopy and did not have any complaints. Tolerating diet Edwards. No Abdominal Pain. No further bowel movements today but is passing gas.   Objective: Vitals:   01/22/16 1015 01/22/16 1020 01/22/16 1025 01/22/16 1030  BP: (!) 111/47 (!) 114/44 (!) 111/50 (!) 114/57  Pulse: 77 78 80 79  Resp: 20 20 (!) 21 18  Temp:      TempSrc:      SpO2: 100% 100% 99% 98%  Weight:      Height:        Intake/Output Summary (Last 24 hours) at 01/22/16 1237 Last data filed at 01/22/16 1032  Gross per 24 hour  Intake             1304 ml  Output                0 ml  Net             1304 ml   Filed Weights   01/21/16 0630 01/21/16 1021  Weight: 119.7 kg (264 lb) 117.2 kg (258 lb 4.8 oz)    Examination: Physical Exam:  Constitutional: WN/WD, NAD and appears calm and comfortable Eyes: Lids and conjunctivae normal, sclerae anicteric  ENMT: External Ears, Nose appear normal. Grossly normal hearing. Neck: Appears normal, supple, no cervical masses, normal ROM, no appreciable thyromegaly Respiratory: Clear to auscultation bilaterally, no wheezing, rales, rhonchi or crackles. Normal respiratory effort and patient is not tachypenic. No accessory muscle use.  Cardiovascular: RRR, no murmurs / rubs / gallops. S1 and S2 auscultated. No extremity edema.  Abdomen: Soft, non-tender, non-distended. No masses palpated. No appreciable hepatosplenomegaly. Bowel sounds positive x4.  GU:  Deferred. Musculoskeletal: No clubbing / cyanosis of digits/nails. No joint deformity upper and lower extremities.  Skin: No rashes, lesions, ulcers on limited skin evaluation. No induration; Warm and dry.  Neurologic: CN 2-12 grossly intact with no focal deficits. Romberg sign cerebellar reflexes not assessed.  Psychiatric: Normal judgment and insight. Alert and oriented x 3. Normal mood and appropriate affect.   Data  Reviewed: I have personally reviewed following labs and imaging studies  CBC:  Recent Labs Lab 01/21/16 0750 01/21/16 1045 01/21/16 1654 01/21/16 2241 01/22/16 0534  WBC 8.1  --   --   --  6.6  HGB 10.6* 9.9* 8.4* 9.5* 8.4*  HCT 33.8* 31.4* 26.3* 29.8* 25.9*  MCV 91.1  --   --   --  91.2  PLT 254  --   --   --  [id number]   Basic Metabolic Panel:  Recent Labs Lab 01/21/16 0750  NA 138  K 4.2  CL 109  CO2 25  GLUCOSE 141*  BUN 16  CREATININE 1.19  CALCIUM 8.4*   GFR: Estimated Creatinine Clearance: 62.3 mL/min (by C-G formula based on SCr of 1.19 mg/dL). Liver Function Tests: No results for input(s): AST, ALT, ALKPHOS, BILITOT, PROT, ALBUMIN in the last 168 hours. No results for input(s): LIPASE, AMYLASE in the last 168 hours. No results for input(s): AMMONIA in the last 168 hours. Coagulation Profile:  Recent Labs Lab 01/21/16 0750 01/21/16 1045 01/22/16 0534  INR 3.38 3.26 1.35   Cardiac Enzymes: No results for input(s): CKTOTAL, CKMB, CKMBINDEX, TROPONINI in the last 168 hours. BNP (last 3 results) No results for input(s): PROBNP in the last 8760 hours. HbA1C: No results for input(s): HGBA1C in the last 72 hours. CBG:  Recent Labs Lab 01/21/16 1141 01/21/16 1724 01/21/16 2244 01/22/16 0731 01/22/16 1129  GLUCAP 137* 126* 128* 117* 113*   Lipid Profile: No results for input(s): CHOL, HDL, LDLCALC, TRIG, CHOLHDL, LDLDIRECT in the last 72 hours. Thyroid Function Tests: No results for input(s): TSH, T4TOTAL, FREET4, T3FREE, THYROIDAB in the last 72 hours. Anemia Panel:  Recent Labs  01/21/16 1045  FERRITIN 21*  TIBC 305  IRON 73   Sepsis Labs: No results for input(s): PROCALCITON, LATICACIDVEN in the last 168 hours.  No results found for this or any previous visit (from the past 240 hour(s)).   Radiology Studies: Ct Angio Abdomen Pelvis  W &/or Wo Contrast  Result Date: 01/21/2016 CLINICAL DATA:  Bright red blood per rectum for 2 days.  Evaluate for bleeding source. EXAM: CT ANGIOGRAPHY ABDOMEN AND PELVIS WITH CONTRAST AND WITHOUT CONTRAST TECHNIQUE: Multidetector CT imaging of the abdomen and pelvis was performed using the standard protocol during bolus administration of intravenous contrast. Multiplanar reconstructed images and MIPs were obtained and reviewed to evaluate the vascular anatomy. CONTRAST:  100 mL Isovue 370 COMPARISON:  None. FINDINGS: VASCULAR Aorta: Atherosclerotic wall calcifications in the abdominal aorta without aneurysm. Negative for an aortic dissection. Celiac: Mild to moderate narrowing at the origin of the celiac trunk that may be secondary to median arcuate ligament compression. The left gastric artery, splenic artery and common hepatic artery are patent. No gross abnormality to the GDA. SMA: Atherosclerotic plaque involving the proximal SMA with moderate stenosis approximately 2.5 cm from the origin. Normal appearance of the main SMA branches. Renals: Right renal artery is patent without significant stenosis. Left renal artery is patent but there is narrowing just beyond the origin and this may be secondary to plaque but also related to  an early takeoff branch. Difficult to exclude stenosis involving the proximal left renal artery. IMA: IMA is patent without significant stenosis. Inflow: Mild atherosclerotic disease in the iliac arteries without significant stenosis. Proximal Outflow: Proximal femoral arteries are patent bilaterally. Veins: Hepatic veins are patent. Portal venous system is patent. IVC and renal veins are patent. Large vein draining into the infrarenal IVC on sequence 5, image 98. This large caval vein is associated with varicosities and the IMV. Review of the MIP images confirms the above findings. NON-VASCULAR Lower chest: Lung bases are clear. There is a large hiatal hernia in the chest. Left atrium is enlarged measuring 8.4 cm in the AP dimension. Findings raise concern for valvular disease.  Hepatobiliary: Low-density structures in the liver are nonspecific, largest measuring 1.8 cm near the right hepatic dome. These could represent hepatic cysts. The liver contour is nodular, particular along the anterior aspect. Findings are concerning for cirrhosis. Normal appearance of the gallbladder. No biliary dilatation. Pancreas: Normal appearance of the pancreas without inflammation or duct dilatation. Spleen: Normal appearance of spleen without enlargement. Adrenals/Urinary Tract: Normal adrenal glands. Lobulated appearance of both kidneys may be associated with areas of scarring. No suspicious renal lesions or hydronephrosis. Urinary bladder is unremarkable. Stomach/Bowel: There is a large hiatal hernia containing approximately half of the stomach. Normal appearance of the duodenum. Normal appearance of small bowel without distension or focal inflammation. Normal appearance of the terminal ileum and appendix. Scattered colonic diverticula but no acute colonic inflammation. Lymphatic: Few prominent lymph nodes in the lower abdominal mesentery on sequence 5, image 159. Small iliac lymph nodes. Reproductive: Prostate and seminal vesicles are unremarkable. Other: Negative for abdominal ascites.  No free air. Musculoskeletal: T12 vertebral body compression fracture containing bone cement. IMPRESSION: VASCULAR Atherosclerotic disease involving the aorta and visceral arteries. Moderate stenosis in the SMA approximately 2.5 cm from the origin. Mild to moderate stenosis at the celiac trunk which could be associated with median arcuate ligament compression. No evidence for contrast extravasation or active GI bleeding. No evidence for a pseudoaneurysm or large arteriovenous malformation. Large vein draining into the infrarenal IVC related to varices and the IMV. Based on the appearance of the liver, findings raise concern for portal hypertension and a portosystemic shunt. NON-VASCULAR Colonic diverticulosis without  acute bowel inflammation. GI bleeding source is not identified. Large hiatal hernia. Liver has mildly nodular contour and raises concern for cirrhosis. Low-density structures near the dome of the liver are indeterminate but could represent cysts. Electronically Signed   By: Markus Daft M.D.   On: 01/21/2016 16:49   Scheduled Meds: . sodium chloride  10 mL/hr Intravenous Once  . allopurinol  300 mg Oral Daily  . cyanocobalamin  1,000 mcg Intramuscular Q30 days  . ferrous sulfate  325 mg Oral Daily  . insulin aspart  0-9 Units Subcutaneous TID WC  . metoprolol succinate  50 mg Oral Daily  . pantoprazole  40 mg Oral BID  . polyethylene glycol  17 g Oral Daily  . simvastatin  20 mg Oral Daily  . sodium chloride flush  3 mL Intravenous Q12H   Continuous Infusions:   LOS: 1 day   Kerney Elbe, DO [facility] Pager [phone]  If 7PM-7AM, please contact night-coverage [email or web address] Password [facility] 01/22/2016, 12:37 PM

## 2016-01-23 ENCOUNTER — Other Ambulatory Visit: Payer: Self-pay | Admitting: Family Medicine

## 2016-01-23 LAB — COMPREHENSIVE METABOLIC PANEL
ALT: 10 U/L — ABNORMAL LOW (ref 17–63)
ANION GAP: 4 — AB (ref 5–15)
AST: 18 U/L (ref 15–41)
Albumin: 3 g/dL — ABNORMAL LOW (ref 3.5–5.0)
Alkaline Phosphatase: 48 U/L (ref 38–126)
BUN: 11 mg/dL (ref 6–20)
CHLORIDE: 107 mmol/L (ref 101–111)
CO2: 26 mmol/L (ref 22–32)
Calcium: 8 mg/dL — ABNORMAL LOW (ref 8.9–10.3)
Creatinine, Ser: 1.28 mg/dL — ABNORMAL HIGH (ref 0.61–1.24)
GFR, EST AFRICAN AMERICAN: 58 mL/min — AB (ref >60)
GFR, EST NON AFRICAN AMERICAN: 50 mL/min — AB (ref >60)
Glucose, Bld: 118 mg/dL — ABNORMAL HIGH (ref 65–99)
POTASSIUM: 4.2 mmol/L (ref 3.5–5.1)
Sodium: 137 mmol/L (ref 135–145)
Total Bilirubin: 0.5 mg/dL (ref 0.3–1.2)
Total Protein: 5.2 g/dL — ABNORMAL LOW (ref 6.5–8.1)

## 2016-01-23 LAB — CBC WITH DIFFERENTIAL/PLATELET
BASOS ABS: 0 10*3/uL (ref 0.0–0.1)
Basophils Relative: 0 %
Eosinophils Absolute: 0.2 10*3/uL (ref 0.0–0.7)
Eosinophils Relative: 4 %
HCT: 25 % — ABNORMAL LOW (ref 39.0–52.0)
Hemoglobin: 7.9 g/dL — ABNORMAL LOW (ref 13.0–17.0)
LYMPHS PCT: 21 %
Lymphs Abs: 1.4 10*3/uL (ref 0.7–4.0)
MCH: 28.9 pg (ref 26.0–34.0)
MCHC: 31.6 g/dL (ref 30.0–36.0)
MCV: 91.6 fL (ref 78.0–100.0)
MONO ABS: 0.7 10*3/uL (ref 0.1–1.0)
Monocytes Relative: 10 %
Neutro Abs: 4.5 10*3/uL (ref 1.7–7.7)
Neutrophils Relative %: 65 %
PLATELETS: 215 10*3/uL (ref 150–400)
RBC: 2.73 MIL/uL — ABNORMAL LOW (ref 4.22–5.81)
RDW: 14.5 % (ref 11.5–15.5)
WBC: 6.9 10*3/uL (ref 4.0–10.5)

## 2016-01-23 LAB — GLUCOSE, CAPILLARY
GLUCOSE-CAPILLARY: 153 mg/dL — AB (ref 65–99)
Glucose-Capillary: 129 mg/dL — ABNORMAL HIGH (ref 65–99)

## 2016-01-23 LAB — MAGNESIUM: MAGNESIUM: 1.8 mg/dL (ref 1.7–2.4)

## 2016-01-23 LAB — PROTIME-INR
INR: 1.2
PROTHROMBIN TIME: 15.3 s — AB (ref 11.4–15.2)

## 2016-01-23 LAB — PHOSPHORUS: Phosphorus: 2.4 mg/dL — ABNORMAL LOW (ref 2.5–4.6)

## 2016-01-23 MED ORDER — BISACODYL 5 MG PO TBEC: 10.0000 mg | DELAYED_RELEASE_TABLET | Freq: Every day | ORAL | 0 refills | Status: DC | PRN

## 2016-01-23 MED ORDER — ASPIRIN EC 325 MG PO TBEC: 325.0000 mg | DELAYED_RELEASE_TABLET | Freq: Every day | ORAL | 0 refills | Status: DC

## 2016-01-23 MED ORDER — PSYLLIUM 28 % PO PACK: 1.0000 | PACK | Freq: Two times a day (BID) | ORAL | 0 refills | Status: DC

## 2016-01-23 NOTE — Discharge Summary (Signed)
Physician Discharge Summary  [name] [id number] DOB: 11/09/1931 DOA: 01/21/2016  PCP: [name], MD  Admit date: 01/21/2016 Discharge date: 01/23/2016  Admitted From: Home Disposition: Home  Recommendations for Outpatient Follow-up:  1. Follow up with PCP Dr. Birdie Riddle in 1-2 weeks 2. Follow up with Gastroenterology Dr. Havery Moros as an Outpatient 3. Follow up with Pelvic Floor PT/Biofeedback training for Pelvic Floor Dyssyngergia (GI Clinic will call to coordinate) 4. Please obtain BMP/CBC within in one week 5. Hold Coumadin for 2 weeks and use ASA 325 mg po Daily for A Fib Treatment given large Ulcer and risk of recurrent bleeding 6. Please follow up on the following pending results: Pathology of Colonoscopy  Home Health: No Equipment/Devices: None  Discharge Condition: Stable CODE STATUS: FULL CODE Diet recommendation: Heart Healthy / Carb Modified  Brief/Interim Summary: Stephen Klase Smithis a 83 y.o.malewith medical history significant of atrial fibrillation on warfarin therapy who presents to the hospital with the chief complaint of bright red blood per rectum. For last 24 hours he had been experiencing bleeding per rectum, it has been moderate to severe intensity, it has occurred about 3 times over last 24 hours, large amounts of bright red blood, with no improving or worsening factors, no associated abdominal pain, nausea or vomiting. Denied any orthostatic symptoms, lightheadedness or dizziness, no syncope. Worked up and admitted for Lower GI bleed and Acute Blood Loss anemia and underwent colonoscopy which showed 2 polyps, diverticulosis, and distal rectal ulcer that was suspected to be the source of bleed. He improved and had no evidence of rebleed even though his Hb dropped slightly (likely dilutional). He improved and was medically stable to be D/C'd and follow up with PCP and with Gastroenterology as an outpatient.   Discharge Diagnoses:  Principal Problem:    Acute lower GI bleeding Active Problems:   Hyperlipidemia   ATRIAL FIBRILLATION   Diabetes mellitus without complication (HCC)   Iron deficiency anemia due to chronic blood loss   Essential hypertension   Rectal bleeding   Warfarin-induced coagulopathy (HCC)   Acute Lower GI Bleed/BRBPR from Bleeding Rectal Ulcer in the setting of Supratherapeutic INR -Hb/Hct went from 10.6/33.8 -> 8.4/25.9 -> 7.9/25.0; Baseline Hb around 12.5-13.0 -Had CTA done which snowed Diverticulosis but no active bleeding -Had Bowel Prep last night and S/p Colonoscopy this AM and found two 6 mm polyps (1 in Ascending colon and 1 in cecum that was resected and retrieved with cold snare), Diverticulosis in Left Colon and Single Rectal Ulcer with visible vessel that was treated with Argon Plasma coagulation -Resume Regular Diet per GI and Hold Coumadin for 2 weeks -Will need Anorectal Manometry and Pelvic floor PT and GI will arrange as an outpatient -C/w Metamucil and Dulcolax prn -Await Current Pathology results -Appreciated Gastroenterology Recc's and Evaluation for further Management; Follow up with Dr. Havery Moros as an outpatient  -Repeat CBC as an outpatient    Acute Blood Loss Anemia  -Hb/Hct went from 10.6/33.8 -> 8.4/25.9 -> 7.9/25.0; (no evidence of rebleed) -Baseline Hb around 12.5-13.0 -Continue Ferrous Sulfate Tablets 325 mg po Daily -Repeat CBC as an outpatient   Atrial Fibrillation -Coumadin Held for atl least 2 weeks at the recommendations of Gastroenterology given risk of rebleeding from large Ulcer -INR was Supratherapeutic at 3.38 and was reversed with Vitamin K and FFP; Repeat INR this AM 1.20 -Continue Rate Control with Metoprolol Succinate 50 mg po Daily -Start ASA 325 mg -Repeat INR later this week -Follow up with Dr. Birdie Riddle and  with Coumadin Clinic   Gout -Chronic and Stable -Continue Allopurinol 300 mg po Daily  Hypertension -Resume Lisinopril 2.5 mg po Daily -C/w Metoprolol  Succinate 50 mg po Daily -Continue to Monitor Vital Signs Closely -Repeat BMP as an outpatient   Hyperlipidemia -C/w Simvastatin 20 mg po Daily  Diabetes Mellitus Type 2 -Resume Home Metformin -Had Sensitive Novolog SSI while hospitalized -Continue to Monitor as an outpatient  Discharge Instructions  Discharge Instructions    Call MD for:  difficulty breathing, headache or visual disturbances    Complete by:  As directed    Call MD for:  extreme fatigue    Complete by:  As directed    Call MD for:  persistant dizziness or light-headedness    Complete by:  As directed    Call MD for:  persistant nausea and vomiting    Complete by:  As directed    Call MD for:  temperature >100.4    Complete by:  As directed    Diet - low sodium heart healthy    Complete by:  As directed    Discharge instructions    Complete by:  As directed    Follow up with PCP and with Gastroenterology as an outpatient. Hold Coumadin for 2 weeks per GI and continue ASA 325 mg for Atrial Fibrillation. If symptoms worsen or change please return to the ED for evaluation.   Increase activity slowly    Complete by:  As directed      Allergies as of 01/23/2016      Reactions   Influenza Vac Split [flu Virus Vaccine] Nausea And Vomiting      Medication List    STOP taking these medications   linaclotide 145 MCG Caps capsule Commonly known as:  LINZESS   warfarin 5 MG tablet Commonly known as:  COUMADIN     TAKE these medications   allopurinol 300 MG tablet Commonly known as:  ZYLOPRIM Take 1 tablet (300 mg total) by mouth daily.   aspirin EC 325 MG tablet Take 1 tablet (325 mg total) by mouth daily.   bisacodyl 5 MG EC tablet Commonly known as:  DULCOLAX Take 2 tablets (10 mg total) by mouth daily as needed for moderate constipation. What changed:  when to take this  reasons to take this   cyanocobalamin 1000 MCG/ML injection Commonly known as:  (VITAMIN B-12) Inject 1 mL (1,000 mcg  total) into the muscle every 30 (thirty) days.   ferrous sulfate 325 (65 FE) MG tablet Take 325 mg by mouth daily.   lisinopril 2.5 MG tablet Commonly known as:  PRINIVIL,ZESTRIL Take 1 tablet (2.5 mg total) by mouth daily.   metFORMIN 500 MG tablet Commonly known as:  GLUCOPHAGE TAKE 1 TABLET DAILY   metoprolol succinate 50 MG 24 hr tablet Commonly known as:  TOPROL-XL Take 1 tablet (50 mg total) by mouth daily.   psyllium 28 % packet Commonly known as:  METAMUCIL SMOOTH TEXTURE Take 1 packet by mouth 2 (two) times daily.   simvastatin 20 MG tablet Commonly known as:  ZOCOR Take 1 tablet (20 mg total) by mouth daily.      Follow-up Information    [name], MD. Call in 1 week(s).   Specialty:  Family Medicine Why:  1 week Contact information: [address] [place] [address] [phone]        Manus Gunning, MD. Call.   Specialty:  Gastroenterology Why:  Their office will call  to schedule you an appointment for follow up and Pelvic Floor PT Contact information: [address]  [place] [address] [phone]          Allergies  Allergen Reactions  . Influenza Vac Split [Flu Virus Vaccine] Nausea And Vomiting    Consultations: Gastroenterology  Procedures/Studies: Ct Angio Abdomen Pelvis  W &/or Wo Contrast  Result Date: 01/21/2016 CLINICAL DATA:  Bright red blood per rectum for 2 days. Evaluate for bleeding source. EXAM: CT ANGIOGRAPHY ABDOMEN AND PELVIS WITH CONTRAST AND WITHOUT CONTRAST TECHNIQUE: Multidetector CT imaging of the abdomen and pelvis was performed using the standard protocol during bolus administration of intravenous contrast. Multiplanar reconstructed images and MIPs were obtained and reviewed to evaluate the vascular anatomy. CONTRAST:  100 mL Isovue 370 COMPARISON:  None. FINDINGS: VASCULAR Aorta: Atherosclerotic wall calcifications in the abdominal aorta without aneurysm. Negative for an aortic  dissection. Celiac: Mild to moderate narrowing at the origin of the celiac trunk that may be secondary to median arcuate ligament compression. The left gastric artery, splenic artery and common hepatic artery are patent. No gross abnormality to the GDA. SMA: Atherosclerotic plaque involving the proximal SMA with moderate stenosis approximately 2.5 cm from the origin. Normal appearance of the main SMA branches. Renals: Right renal artery is patent without significant stenosis. Left renal artery is patent but there is narrowing just beyond the origin and this may be secondary to plaque but also related to an early takeoff branch. Difficult to exclude stenosis involving the proximal left renal artery. IMA: IMA is patent without significant stenosis. Inflow: Mild atherosclerotic disease in the iliac arteries without significant stenosis. Proximal Outflow: Proximal femoral arteries are patent bilaterally. Veins: Hepatic veins are patent. Portal venous system is patent. IVC and renal veins are patent. Large vein draining into the infrarenal IVC on sequence 5, image 98. This large caval vein is associated with varicosities and the IMV. Review of the MIP images confirms the above findings. NON-VASCULAR Lower chest: Lung bases are clear. There is a large hiatal hernia in the chest. Left atrium is enlarged measuring 8.4 cm in the AP dimension. Findings raise concern for valvular disease. Hepatobiliary: Low-density structures in the liver are nonspecific, largest measuring 1.8 cm near the right hepatic dome. These could represent hepatic cysts. The liver contour is nodular, particular along the anterior aspect. Findings are concerning for cirrhosis. Normal appearance of the gallbladder. No biliary dilatation. Pancreas: Normal appearance of the pancreas without inflammation or duct dilatation. Spleen: Normal appearance of spleen without enlargement. Adrenals/Urinary Tract: Normal adrenal glands. Lobulated appearance of both  kidneys may be associated with areas of scarring. No suspicious renal lesions or hydronephrosis. Urinary bladder is unremarkable. Stomach/Bowel: There is a large hiatal hernia containing approximately half of the stomach. Normal appearance of the duodenum. Normal appearance of small bowel without distension or focal inflammation. Normal appearance of the terminal ileum and appendix. Scattered colonic diverticula but no acute colonic inflammation. Lymphatic: Few prominent lymph nodes in the lower abdominal mesentery on sequence 5, image 159. Small iliac lymph nodes. Reproductive: Prostate and seminal vesicles are unremarkable. Other: Negative for abdominal ascites.  No free air. Musculoskeletal: T12 vertebral body compression fracture containing bone cement. IMPRESSION: VASCULAR Atherosclerotic disease involving the aorta and visceral arteries. Moderate stenosis in the SMA approximately 2.5 cm from the origin. Mild to moderate stenosis at the celiac trunk which could be associated with median arcuate ligament compression. No evidence for contrast extravasation or active GI bleeding. No evidence  for a pseudoaneurysm or large arteriovenous malformation. Large vein draining into the infrarenal IVC related to varices and the IMV. Based on the appearance of the liver, findings raise concern for portal hypertension and a portosystemic shunt. NON-VASCULAR Colonic diverticulosis without acute bowel inflammation. GI bleeding source is not identified. Large hiatal hernia. Liver has mildly nodular contour and raises concern for cirrhosis. Low-density structures near the dome of the liver are indeterminate but could represent cysts. Electronically Signed   By: Markus Daft M.D.   On: 01/21/2016 16:49    COLONOSCOPY      - Mucosa felt "rough" and abnormal to palpation.  found on perianal exam. - One 6 mm polyp in the cecum, removed with a cold   snare. Resected and retrieved. - One 6 mm polyp in the ascending colon, removed  with a cold snare. Resected and retrieved. - Diverticulosis in the left colon. - A single (solitary) ulcer in the distal rectum  with visible vessel, the likely cause of symptoms.  Treated with argon plasma coagulation (APC).  Biopsied. - The examined portion of the ileum was normal. - The examination was otherwise normal.  Subjective: Seen and examined and did well overnight with no evidence of rebleed. Had no active complaints or concerns. No N/V/Abdominal Pain. Has not had a BM yet. No CP or SOB and tolerating diet well and ready to go home.  Discharge Exam: Vitals:   01/22/16 2130 01/23/16 0623  BP: 111/71 120/64  Pulse: 76 84  Resp: 18 18  Temp: 97.9 F (36.6 C) 98.1 F (36.7 C)   Vitals:   01/22/16 1030 01/22/16 1518 01/22/16 2130 01/23/16 0623  BP: (!) 114/57 124/68 111/71 120/64  Pulse: 79 76 76 84  Resp: 18 19 18 18   Temp:  98.6 F (37 C) 97.9 F (36.6 C) 98.1 F (36.7 C)  TempSrc:  Oral Oral Oral  SpO2: 98% 100% 100% 100%  Weight:      Height:       General: Pt is alert, awake, not in acute distress Cardiovascular: Irregularly Irregular, S1/S2 +, no rubs, no gallops Respiratory: CTA bilaterally, no wheezing, no rhonchi Abdominal: Soft, NT, ND, bowel sounds + x4 Extremities: no edema, no cyanosis  The results of significant diagnostics from this hospitalization (including imaging, microbiology, ancillary and laboratory) are listed below for reference.    Microbiology: No results found for this or any previous visit (from the past 240 hour(s)).   Labs: BNP (last 3 results) No results for input(s): BNP in the last 8760  hours. Basic Metabolic Panel:  Recent Labs Lab 01/21/16 0750 01/23/16 0515  NA 138 137  K 4.2 4.2  CL 109 107  CO2 25 26  GLUCOSE 141* 118*  BUN 16 11  CREATININE 1.19 1.28*  CALCIUM 8.4* 8.0*  MG  --  1.8  PHOS  --  2.4*   Liver Function Tests:  Recent Labs Lab 01/23/16 0515  AST 18  ALT 10*  ALKPHOS 48  BILITOT 0.5  PROT 5.2*  ALBUMIN 3.0*   No results for input(s): LIPASE, AMYLASE in the last 168 hours. No results for input(s): AMMONIA in the last 168 hours. CBC:  Recent Labs Lab 01/21/16 0750 01/21/16 1045 01/21/16 1654 01/21/16 2241 01/22/16 0534 01/23/16 0515  WBC 8.1  --   --   --  6.6 6.9  NEUTROABS  --   --   --   --   --  4.5  HGB 10.6* 9.9* 8.4* 9.5*  8.4* 7.9*  HCT 33.8* 31.4* 26.3* 29.8* 25.9* 25.0*  MCV 91.1  --   --   --  91.2 91.6  PLT 254  --   --   --  227 215   Cardiac Enzymes: No results for input(s): CKTOTAL, CKMB, CKMBINDEX, TROPONINI in the last 168 hours. BNP: Invalid input(s): POCBNP CBG:  Recent Labs Lab 01/22/16 0731 01/22/16 1129 01/22/16 1659 01/22/16 2230 01/23/16 0756  GLUCAP 117* 113* 165* 118* 153*   D-Dimer No results for input(s): DDIMER in the last 72 hours. Hgb A1c No results for input(s): HGBA1C in the last 72 hours. Lipid Profile No results for input(s): CHOL, HDL, LDLCALC, TRIG, CHOLHDL, LDLDIRECT in the last 72 hours. Thyroid function studies No results for input(s): TSH, T4TOTAL, T3FREE, THYROIDAB in the last 72 hours.  Invalid input(s): FREET3 Anemia work up  Recent Labs  01/21/16 1045  FERRITIN 21*  TIBC 305  IRON 73   Urinalysis No results found for: COLORURINE, APPEARANCEUR, LABSPEC, [place], GLUCOSEU, HGBUR, BILIRUBINUR, KETONESUR, PROTEINUR, UROBILINOGEN, NITRITE, LEUKOCYTESUR Sepsis Labs Invalid input(s): PROCALCITONIN,  WBC,  LACTICIDVEN Microbiology No results found for this or any previous visit (from the past 240 hour(s)).  Time coordinating discharge: Over 30  minutes  SIGNED:  Kerney Elbe, DO [facility] 01/23/2016, 11:36 AM Pager [phone]  If 7PM-7AM, please contact night-coverage [email or web address] Password [email or web address]

## 2016-01-23 NOTE — Progress Notes (Signed)
Progress Note   Subjective  Patient did well since the procedure. No further bleeding, tolerating a diet. Overall feels well   Objective   Vital signs in last 24 hours: Temp:  [97 F (36.1 C)-98.6 F (37 C)] 98.1 F (36.7 C) (01/21 0623) Pulse Rate:  [73-85] 84 (01/21 0623) Resp:  [10-21] 18 (01/21 0623) BP: (92-135)/(31-71) 120/64 (01/21 0623) SpO2:  [98 %-100 %] 100 % (01/21 0623) Last BM Date: 01/22/16 General:    white male in NAD Heart:  Regular rate and rhythm;  Lungs: Respirations even and unlabored,  Abdomen:  Soft, nontender, protuberant. Normal bowel sounds. Extremities:  Without edema. Neurologic:  Alert and oriented,  grossly normal neurologically. Psych:  Cooperative. Normal mood and affect.  Intake/Output from previous day: 01/20 0701 - 01/21 0700 In: 780 [P.O.:480; I.V.:300] Out: -  Intake/Output this shift: No intake/output data recorded.  Lab Results:  Recent Labs  01/21/16 0750  01/21/16 2241 01/22/16 0534 01/23/16 0515  WBC 8.1  --   --  6.6 6.9  HGB 10.6*  < > 9.5* 8.4* 7.9*  HCT 33.8*  < > 29.8* 25.9* 25.0*  PLT 254  --   --  227 215  < > = values in this interval not displayed. BMET  Recent Labs  01/21/16 0750 01/23/16 0515  NA 138 137  K 4.2 4.2  CL 109 107  CO2 25 26  GLUCOSE 141* 118*  BUN 16 11  CREATININE 1.19 1.28*  CALCIUM 8.4* 8.0*   LFT  Recent Labs  01/23/16 0515  PROT 5.2*  ALBUMIN 3.0*  AST 18  ALT 10*  ALKPHOS 48  BILITOT 0.5   PT/INR  Recent Labs  01/22/16 0534 01/23/16 0515  LABPROT 16.7* 15.3*  INR 1.35 1.20    Studies/Results: Ct Angio Abdomen Pelvis  W &/or Wo Contrast  Result Date: 01/21/2016 CLINICAL DATA:  Bright red blood per rectum for 2 days. Evaluate for bleeding source. EXAM: CT ANGIOGRAPHY ABDOMEN AND PELVIS WITH CONTRAST AND WITHOUT CONTRAST TECHNIQUE: Multidetector CT imaging of the abdomen and pelvis was performed using the standard protocol during bolus administration of  intravenous contrast. Multiplanar reconstructed images and MIPs were obtained and reviewed to evaluate the vascular anatomy. CONTRAST:  100 mL Isovue 370 COMPARISON:  None. FINDINGS: VASCULAR Aorta: Atherosclerotic wall calcifications in the abdominal aorta without aneurysm. Negative for an aortic dissection. Celiac: Mild to moderate narrowing at the origin of the celiac trunk that may be secondary to median arcuate ligament compression. The left gastric artery, splenic artery and common hepatic artery are patent. No gross abnormality to the GDA. SMA: Atherosclerotic plaque involving the proximal SMA with moderate stenosis approximately 2.5 cm from the origin. Normal appearance of the main SMA branches. Renals: Right renal artery is patent without significant stenosis. Left renal artery is patent but there is narrowing just beyond the origin and this may be secondary to plaque but also related to an early takeoff branch. Difficult to exclude stenosis involving the proximal left renal artery. IMA: IMA is patent without significant stenosis. Inflow: Mild atherosclerotic disease in the iliac arteries without significant stenosis. Proximal Outflow: Proximal femoral arteries are patent bilaterally. Veins: Hepatic veins are patent. Portal venous system is patent. IVC and renal veins are patent. Large vein draining into the infrarenal IVC on sequence 5, image 98. This large caval vein is associated with varicosities and the IMV. Review of the MIP images confirms the above findings. NON-VASCULAR Lower chest: Lung  bases are clear. There is a large hiatal hernia in the chest. Left atrium is enlarged measuring 8.4 cm in the AP dimension. Findings raise concern for valvular disease. Hepatobiliary: Low-density structures in the liver are nonspecific, largest measuring 1.8 cm near the right hepatic dome. These could represent hepatic cysts. The liver contour is nodular, particular along the anterior aspect. Findings are  concerning for cirrhosis. Normal appearance of the gallbladder. No biliary dilatation. Pancreas: Normal appearance of the pancreas without inflammation or duct dilatation. Spleen: Normal appearance of spleen without enlargement. Adrenals/Urinary Tract: Normal adrenal glands. Lobulated appearance of both kidneys may be associated with areas of scarring. No suspicious renal lesions or hydronephrosis. Urinary bladder is unremarkable. Stomach/Bowel: There is a large hiatal hernia containing approximately half of the stomach. Normal appearance of the duodenum. Normal appearance of small bowel without distension or focal inflammation. Normal appearance of the terminal ileum and appendix. Scattered colonic diverticula but no acute colonic inflammation. Lymphatic: Few prominent lymph nodes in the lower abdominal mesentery on sequence 5, image 159. Small iliac lymph nodes. Reproductive: Prostate and seminal vesicles are unremarkable. Other: Negative for abdominal ascites.  No free air. Musculoskeletal: T12 vertebral body compression fracture containing bone cement. IMPRESSION: VASCULAR Atherosclerotic disease involving the aorta and visceral arteries. Moderate stenosis in the SMA approximately 2.5 cm from the origin. Mild to moderate stenosis at the celiac trunk which could be associated with median arcuate ligament compression. No evidence for contrast extravasation or active GI bleeding. No evidence for a pseudoaneurysm or large arteriovenous malformation. Large vein draining into the infrarenal IVC related to varices and the IMV. Based on the appearance of the liver, findings raise concern for portal hypertension and a portosystemic shunt. NON-VASCULAR Colonic diverticulosis without acute bowel inflammation. GI bleeding source is not identified. Large hiatal hernia. Liver has mildly nodular contour and raises concern for cirrhosis. Low-density structures near the dome of the liver are indeterminate but could represent  cysts. Electronically Signed   By: Markus Daft M.D.   On: 01/21/2016 16:49       Assessment / Plan:   83 y/o male on Coumadin for history of atrial fibrillation, presented with lower GI bleed, painless hematochezia. CT angio negative on admission but he continued to have bleeding. Colonoscopy done yesterday: 2 small polyps removed, diverticulosis in the left colon, with large solitary rectal ulcer with visible vessel in the distal rectum, treated with APC and biopsies obtained.  No bleeding since the procedure. He has chronic constipation and based on symptoms suspect pelvic floor dyssynergy as his risk factors for solitary rectal ulcer. Hgb downtrended but he has had no recurrence of symptoms - if he is bleeding, we should see it come out given location just proximal to the dentate line, thus this is likely due to blood draws / dilutional. I think he is stable for discharge home today.  Recommend: - okay for discharge home from my perspective - at risk for recurrent bleeding given large size of ulcer if on Coumadin, may want to hold this for 2 weeks and use aspirin 325mg  instead for a-fibb treatment during this time - discussed options with patient for constipation, his preference is to use metamucil daily with dulcolax PRN. Will stop miralax - I am going to refer him for pelvic floor PT / biofeedback for pelvic floor dyssyndergia, out office will contact him as outpatient to coordinate - repeat CBC later next week to ensure uptrending. He will call with any recurrence of symptoms - patient  to be contacted with pathology results   Please call with questions.  Bridger Cellar, MD [facility] Gastroenterology Pager [phone]

## 2016-01-24 ENCOUNTER — Encounter (HOSPITAL_COMMUNITY): Payer: Self-pay | Admitting: Gastroenterology

## 2016-01-24 ENCOUNTER — Other Ambulatory Visit: Payer: Self-pay

## 2016-01-24 ENCOUNTER — Ambulatory Visit (INDEPENDENT_AMBULATORY_CARE_PROVIDER_SITE_OTHER): Payer: Medicare Other | Admitting: General Practice

## 2016-01-24 ENCOUNTER — Telehealth: Payer: Self-pay

## 2016-01-24 DIAGNOSIS — Z5181 Encounter for therapeutic drug level monitoring: Secondary | ICD-10-CM

## 2016-01-24 DIAGNOSIS — R278 Other lack of coordination: Secondary | ICD-10-CM

## 2016-01-24 DIAGNOSIS — I4891 Unspecified atrial fibrillation: Secondary | ICD-10-CM

## 2016-01-24 DIAGNOSIS — R935 Abnormal findings on diagnostic imaging of other abdominal regions, including retroperitoneum: Secondary | ICD-10-CM

## 2016-01-24 LAB — PREPARE FRESH FROZEN PLASMA
Blood Product Expiration Date: 201801242359
Blood Product Expiration Date: 201801242359
ISSUE DATE / TIME: 201801192020
ISSUE DATE / TIME: 201801192331
UNIT TYPE AND RH: 6200
Unit Type and Rh: 6200

## 2016-01-24 LAB — POCT INR: INR: 1.1

## 2016-01-24 NOTE — Patient Instructions (Addendum)
Pre visit review using our clinic review tool, if applicable. No additional management support is needed unless otherwise documented below in the visit note. Patient discharged from hospital on 1/21 with a GI bleed.  Coumadin has been stopped for 2 weeks.  Patient is taking a 325 mg aspirin for the time being.  I will contact patient in 2 weeks to find out the ongoing plan of care.

## 2016-01-24 NOTE — Telephone Encounter (Signed)
Thanks Almyra Free. I'm not convinced he has cirrhosis, but CT raised the question. I think a [place] with elastography of the liver may be useful to assess for this if you can order it, and we will let him know the results. Thanks for placing referral

## 2016-01-24 NOTE — Telephone Encounter (Signed)
-----   Message from Manus Gunning, MD sent at 01/23/2016  8:23 AM EST ----- Almyra Free this patient is being discharged today He needs to have pelvic floor PT coordinated for chronic constipation, pelvic floor dyssynergia, causing solitary rectal ulcer I would also like him to have a repeat CBC next week to ensure stable  Thanks much

## 2016-01-24 NOTE — Telephone Encounter (Signed)
Patient advised of date/time of [place] with elastography scheduled at [facility] on 1/26 arrive at 10:45 for 11:00, NPO after midnight.

## 2016-01-24 NOTE — Telephone Encounter (Signed)
Patient advised to come in for lab work. He had a question regarding follow up on CT results and liver cirrhosis. PT referral placed for pelvic floor training.

## 2016-01-24 NOTE — Progress Notes (Signed)
I agree with this plan.

## 2016-01-25 ENCOUNTER — Ambulatory Visit (INDEPENDENT_AMBULATORY_CARE_PROVIDER_SITE_OTHER): Payer: Medicare Other | Admitting: Family Medicine

## 2016-01-25 ENCOUNTER — Other Ambulatory Visit: Payer: Self-pay | Admitting: Family Medicine

## 2016-01-25 ENCOUNTER — Encounter: Payer: Self-pay | Admitting: Family Medicine

## 2016-01-25 ENCOUNTER — Telehealth: Payer: Self-pay

## 2016-01-25 ENCOUNTER — Encounter: Payer: Self-pay | Admitting: Gastroenterology

## 2016-01-25 ENCOUNTER — Other Ambulatory Visit (INDEPENDENT_AMBULATORY_CARE_PROVIDER_SITE_OTHER): Payer: Medicare Other

## 2016-01-25 VITALS — BP 110/54 | HR 76 | Temp 98.2°F | Resp 17 | Ht 71.0 in | Wt 263.0 lb

## 2016-01-25 DIAGNOSIS — R278 Other lack of coordination: Secondary | ICD-10-CM | POA: Diagnosis not present

## 2016-01-25 DIAGNOSIS — T45515A Adverse effect of anticoagulants, initial encounter: Secondary | ICD-10-CM

## 2016-01-25 DIAGNOSIS — D6832 Hemorrhagic disorder due to extrinsic circulating anticoagulants: Secondary | ICD-10-CM

## 2016-01-25 DIAGNOSIS — K922 Gastrointestinal hemorrhage, unspecified: Secondary | ICD-10-CM | POA: Diagnosis not present

## 2016-01-25 DIAGNOSIS — D689 Coagulation defect, unspecified: Secondary | ICD-10-CM | POA: Diagnosis not present

## 2016-01-25 LAB — CBC WITH DIFFERENTIAL/PLATELET
Basophils Absolute: 0 10*3/uL (ref 0.0–0.1)
Basophils Relative: 0.3 % (ref 0.0–3.0)
EOS PCT: 2.3 % (ref 0.0–5.0)
Eosinophils Absolute: 0.2 10*3/uL (ref 0.0–0.7)
HCT: 25.7 % — ABNORMAL LOW (ref 39.0–52.0)
LYMPHS ABS: 1.1 10*3/uL (ref 0.7–4.0)
Lymphocytes Relative: 15.9 % (ref 12.0–46.0)
MCHC: 32.8 g/dL (ref 30.0–36.0)
MCV: 89.2 fl (ref 78.0–100.0)
MONOS PCT: 12.6 % — AB (ref 3.0–12.0)
Monocytes Absolute: 0.9 10*3/uL (ref 0.1–1.0)
Neutro Abs: 4.8 10*3/uL (ref 1.4–7.7)
Neutrophils Relative %: 68.9 % (ref 43.0–77.0)
Platelets: 253 10*3/uL (ref 150.0–400.0)
RBC: 2.89 Mil/uL — AB (ref 4.22–5.81)
RDW: 15.5 % (ref 11.5–15.5)
WBC: 6.9 10*3/uL (ref 4.0–10.5)

## 2016-01-25 NOTE — Progress Notes (Signed)
Letter mailed

## 2016-01-25 NOTE — Assessment & Plan Note (Signed)
New to provider, cause of pt's recent hospitalization.  Has GI f/u scheduled, has had no further episodes of bleeding.  Currently asymptomatic.  Pt is off his coumadin x2 weeks and on full strength ASA.  Will follow along and assist as able.

## 2016-01-25 NOTE — Telephone Encounter (Signed)
Okay thanks. I think Almyra Free has already gotten the pelvic floor PT consult going, maybe they just need to call. Thanks

## 2016-01-25 NOTE — Telephone Encounter (Signed)
-----   Message from Manus Gunning, MD sent at 01/25/2016  1:01 PM EST ----- Caryl Pina can you please let this patient know that his Hgb is improved from his hospitalization and I expect it to continue to rise if he has no further symptoms. Biopsies of the ulcer from his colon were benign. I suspect the ulcer is due to constipation. We should have already referred him to pelvic floor PT for constipation which I suspect is driving this issue. I have already mailed a letter to him as well to explain results of his biopsies.   Can you please relay this to him? Thanks

## 2016-01-25 NOTE — Telephone Encounter (Signed)
Pt informed. He states that his constipation has improved and wanted me to make you aware of that. He has not heard from PT yet so I will check on an appt. Pt also states that he is having on U/S to evaluate his liver on Friday.

## 2016-01-25 NOTE — Assessment & Plan Note (Signed)
Pt is off coumadin and INR is back to normal.  Plan is to resume coumadin in 2 weeks and maintain on ASA 325mg  daily until then.  Pt expressed understanding and is in agreement w/ plan.

## 2016-01-25 NOTE — Patient Instructions (Addendum)
Follow up as needed/scheduled Follow up with GI as scheduled Continue to take the Metamucil as directed to help w/ BMs Drink plenty of fluids Schedule a follow up with the Coumadin clinic in 2 weeks to restart the coumadin Call with any questions or concerns Hang in there!  You look great!!

## 2016-01-25 NOTE — Progress Notes (Signed)
Pre visit review using our clinic review tool, if applicable. No additional management support is needed unless otherwise documented below in the visit note. 

## 2016-01-25 NOTE — Telephone Encounter (Signed)
Gregary Signs can you check on the PT referral please and thanks.

## 2016-01-25 NOTE — Progress Notes (Signed)
   Subjective:    Patient ID: [name], male    DOB: [birth year], 83 y.o.   MRN: [id number]  [facility] f/u- pt was admitted 1/19-21 w/ GI bleed.  CTA was negative for source of bleed but colonoscopy showed bleeding rectal ulcer.  Pt's coumadin was stopped for 2 weeks due to risk of re-bleed and he is on ASA 325mg  once daily.  Pt denies dizziness, SOB, CP.  Pt has not had any bleeding since he returned home.  Has GI f/u pending (Dr Havery Moros).  Able to move bowels without difficulty.  CT showed findings 'concerning for cirrhosis'- has GI f/u on Friday.  CT scan reviewed, colonoscopy report reviewed, D/C summary reviewed.  Med rec performed.   Review of Systems For ROS see HPI     Objective:   Physical Exam  Constitutional: He is oriented to person, place, and time. He appears well-developed and well-nourished. No distress.  HENT:  Head: Normocephalic and atraumatic.  Eyes: Conjunctivae and EOM are normal. Pupils are equal, round, and reactive to light.  Neck: Normal range of motion. Neck supple. No thyromegaly present.  Cardiovascular: Normal rate, regular rhythm, normal heart sounds and intact distal pulses.   No murmur heard. Pulmonary/Chest: Effort normal and breath sounds normal. No respiratory distress.  Abdominal: Soft. Bowel sounds are normal. He exhibits no distension.  Musculoskeletal: He exhibits no edema.  Lymphadenopathy:    He has no cervical adenopathy.  Neurological: He is alert and oriented to person, place, and time. No cranial nerve deficit.  Skin: Skin is warm and dry.  Psychiatric: He has a normal mood and affect. His behavior is normal.  Vitals reviewed.         Assessment & Plan:

## 2016-01-25 NOTE — Telephone Encounter (Signed)
Referral was only placed yesterday. Called patient and gave him their phone number to contact if he does not hear back from them by the end of the week.

## 2016-01-28 ENCOUNTER — Ambulatory Visit (HOSPITAL_COMMUNITY)
Admission: RE | Admit: 2016-01-28 | Discharge: 2016-01-28 | Disposition: A | Discharge status: Home / Self Care | Payer: Medicare Other | Source: Ambulatory Visit | Attending: Gastroenterology | Admitting: Gastroenterology

## 2016-01-28 DIAGNOSIS — R935 Abnormal findings on diagnostic imaging of other abdominal regions, including retroperitoneum: Secondary | ICD-10-CM | POA: Insufficient documentation

## 2016-01-31 ENCOUNTER — Ambulatory Visit (INDEPENDENT_AMBULATORY_CARE_PROVIDER_SITE_OTHER): Payer: Medicare Other | Admitting: Physician Assistant

## 2016-01-31 ENCOUNTER — Other Ambulatory Visit: Payer: Self-pay | Admitting: Emergency Medicine

## 2016-01-31 ENCOUNTER — Encounter: Payer: Self-pay | Admitting: Physician Assistant

## 2016-01-31 ENCOUNTER — Telehealth: Payer: Self-pay | Admitting: Family Medicine

## 2016-01-31 VITALS — BP 116/62 | HR 77 | Temp 99.4°F | Resp 16 | Ht 71.0 in | Wt 262.0 lb

## 2016-01-31 DIAGNOSIS — D649 Anemia, unspecified: Secondary | ICD-10-CM | POA: Diagnosis not present

## 2016-01-31 DIAGNOSIS — R6889 Other general symptoms and signs: Secondary | ICD-10-CM

## 2016-01-31 LAB — CBC
HCT: 27 % — ABNORMAL LOW (ref 39.0–52.0)
Hemoglobin: 8.8 g/dL — ABNORMAL LOW (ref 13.0–17.0)
MCHC: 32.5 g/dL (ref 30.0–36.0)
MCV: 91 fl (ref 78.0–100.0)
Platelets: 195 10*3/uL (ref 150.0–400.0)
RBC: 2.97 Mil/uL — ABNORMAL LOW (ref 4.22–5.81)
RDW: 15.5 % (ref 11.5–15.5)
WBC: 4.8 10*3/uL (ref 4.0–10.5)

## 2016-01-31 LAB — POCT INFLUENZA A/B
INFLUENZA A, POC: NEGATIVE
Influenza B, POC: NEGATIVE

## 2016-01-31 MED ORDER — OSELTAMIVIR PHOSPHATE 30 MG PO CAPS: 30.0000 mg | ORAL_CAPSULE | Freq: Two times a day (BID) | ORAL | 0 refills | Status: DC

## 2016-01-31 NOTE — Telephone Encounter (Signed)
Advised patient rx was resent to the [facility] in [place]. They do have quantity of the Tamiflu. Advised patient to get Delsym or Coricidin HBP OTC for the cough. He is agreeable.

## 2016-01-31 NOTE — Patient Instructions (Signed)
Please stay hydrated and get plenty of rest. Continue chronic medications as directed. Start the Tamiflu as directed. I have dosed it based on age, weight and kidney function.   Use Delsym or Coricidin HBP for cough. Place a humidifier in the bedroom. Please follow-up with [place] on Thursday for Friday for reassessment. If you notice any worsening symptoms, please go to the ER.   Influenza, Adult Influenza ("the flu") is an infection in the lungs, nose, and throat (respiratory tract). It is caused by a virus. The flu causes many common cold symptoms, as well as a high fever and body aches. It can make you feel very sick. The flu spreads easily from person to person (is contagious). Getting a flu shot (influenza vaccination) every year is the best way to prevent the flu. Follow these instructions at home:  Take over-the-counter and prescription medicines only as told by your doctor.  Use a cool mist humidifier to add moisture (humidity) to the air in your home. This can make it easier to breathe.  Rest as needed.  Drink enough fluid to keep your pee (urine) clear or pale yellow.  Cover your mouth and nose when you cough or sneeze.  Wash your hands with soap and water often, especially after you cough or sneeze. If you cannot use soap and water, use hand sanitizer.  Stay home from work or school as told by your doctor. Unless you are visiting your doctor, try to avoid leaving home until your fever has been gone for 24 hours without the use of medicine.  Keep all follow-up visits as told by your doctor. This is important. How is this prevented?  Getting a yearly (annual) flu shot is the best way to avoid getting the flu. You may get the flu shot in late summer, fall, or winter. Ask your doctor when you should get your flu shot.  Wash your hands often or use hand sanitizer often.  Avoid contact with people who are sick during cold and flu season.  Eat healthy foods.  Drink plenty of  fluids.  Get enough sleep.  Exercise regularly. Contact a doctor if:  You get new symptoms.  You have:  Chest pain.  Watery poop (diarrhea).  A fever.  Your cough gets worse.  You start to have more mucus.  You feel sick to your stomach (nauseous).  You throw up (vomit). Get help right away if:  You start to be short of breath or have trouble breathing.  Your skin or nails turn a bluish color.  You have very bad pain or stiffness in your neck.  You get a sudden headache.  You get sudden pain in your face or ear.  You cannot stop throwing up. This information is not intended to replace advice given to you by your health care provider. Make sure you discuss any questions you have with your health care provider. Document Released: 09/28/2007 Document Revised: 05/27/2015 Document Reviewed: 10/13/2014 Elsevier Interactive Patient Education  2017 [employer].

## 2016-01-31 NOTE — Progress Notes (Signed)
Pre visit review using our clinic review tool, if applicable. No additional management support is needed unless otherwise documented below in the visit note. 

## 2016-01-31 NOTE — Progress Notes (Signed)
Patient presents to clinic today c/o sudden onset of chills, aches and fever starting Sunday. Notes Tmax 102 at night. Endorses cough and nasal congestion. Endorses exposure to the flu at church. Has not taken flu shot this year. Denies chest pain or SOB. Denies sinus pain, ear pain or facial pain.   Patient also recently hospitalized for acute GI bleed found to be secondary to rectal ulcer. Bleeding ceased in hospital. Patient was taken off of coumadin until his FU with GI. Patient denies any recurrence of bleeding. Notes having fatigue but feels is related to recent respiratory symptoms.   Past Medical History:  Diagnosis Date  . Anemia   . Atrial fibrillation ([place])   . Blood transfusion without reported diagnosis   . Chronic constipation    colace helps  . Chronic renal insufficiency, stage II (mild)    CrCl _0 .  Marland Kitchen Chronic venous insufficiency   . Diabetes mellitus without complication (HCC)    hx of good control  . GI bleed    slow/occult: pan-endoscopy + capsule endoscopy showed no site of bleeding.  Takes one iron tab a day and Hb has been stable.  . Gout   . Hyperlipidemia   . Hypertension   . Varicose veins of both lower extremities with complications   . Vitamin B12 deficiency     Current Outpatient Prescriptions on File Prior to Visit  Medication Sig Dispense Refill  . allopurinol (ZYLOPRIM) 300 MG tablet Take 1 tablet (300 mg total) by mouth daily. 90 tablet 3  . aspirin EC 325 MG tablet Take 1 tablet (325 mg total) by mouth daily. 30 tablet 0  . bisacodyl (DULCOLAX) 5 MG EC tablet Take 2 tablets (10 mg total) by mouth daily as needed for moderate constipation. 30 tablet 0  . cyanocobalamin (,VITAMIN B-12,) 1000 MCG/ML injection Inject 1 mL (1,000 mcg total) into the muscle every 30 (thirty) days. 1 mL 1  . ferrous sulfate 325 (65 FE) MG tablet Take 325 mg by mouth daily.     Marland Kitchen lisinopril (PRINIVIL,ZESTRIL) 2.5 MG tablet TAKE 1 TABLET BY MOUTH EVERY DAY 90 tablet 0  .  metFORMIN (GLUCOPHAGE) 500 MG tablet TAKE 1 TABLET DAILY 90 tablet 1  . metoprolol succinate (TOPROL-XL) 50 MG 24 hr tablet Take 1 tablet (50 mg total) by mouth daily. 90 tablet 3  . psyllium (METAMUCIL SMOOTH TEXTURE) 28 % packet Take 1 packet by mouth 2 (two) times daily. 60 packet 0  . simvastatin (ZOCOR) 20 MG tablet Take 1 tablet (20 mg total) by mouth daily. 90 tablet 3   No current facility-administered medications on file prior to visit.     Allergies  Allergen Reactions  . Influenza Vac Split [Flu Virus Vaccine] Nausea And Vomiting    Family History  Problem Relation Age of Onset  . Alcohol abuse Father   . Diabetes Father   . Obesity Brother     Social History   Social History  . Marital status: Married    Spouse name: N/A  . Number of children: N/A  . Years of education: N/A   Social History Main Topics  . Smoking status: Former Smoker    Types: Cigarettes  . Smokeless tobacco: Never Used  . Alcohol use No  . Drug use: No  . Sexual activity: Not Asked   Other Topics Concern  . None   Social History Narrative   Married, 3 daughters, 4 grandchildren.      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   Retired 2012.      Remote smoking hx: quit 55 yrs ago.  No alcohol in 55 yrs.         Review of Systems - See HPI.  All other ROS are negative.  BP 116/62   Temp 99.4 F (37.4 C) (Oral)   Resp 16   Ht '5\' 11"'$  (1.803 m)   Wt 262 lb (118.8 kg)   BMI 36.54 kg/m   Physical Exam  Constitutional: He is oriented to person, place, and time and well-developed, well-nourished, and in no distress.  HENT:  Head: Normocephalic and atraumatic.  Right Ear: External ear normal.  Left Ear: External ear normal.  Nose: Nose normal.  Mouth/Throat: Oropharynx is clear and moist. No oropharyngeal exudate.  TM within normal limits bilaterally.  Eyes: Conjunctivae are normal. Pupils are equal, round, and  reactive to light.  Neck: Neck supple.  Cardiovascular: Normal rate, regular rhythm, normal heart sounds and intact distal pulses.   Pulmonary/Chest: Effort normal and breath sounds normal. No respiratory distress. He has no wheezes. He has no rales. He exhibits no tenderness.  Lymphadenopathy:    He has no cervical adenopathy.  Neurological: He is alert and oriented to person, place, and time.  Skin: Skin is warm and dry. No rash noted.  Psychiatric: Affect normal.  Vitals reviewed.  Recent Results (from the past 2160 hour(s))  POCT INR     Status: None   Collection Time: 12/13/15 12:00 AM  Result Value Ref Range   INR 3.3   Hemoglobin A1c     Status: None   Collection Time: 12/20/15 10:46 AM  Result Value Ref Range   Hgb A1c MFr Bld 5.8 4.6 - 6.5 %    Comment: Glycemic Control Guidelines for People with Diabetes:Non Diabetic:  <6%Goal of Therapy: <7%Additional Action Suggested:  >8%   Lipid panel     Status: Abnormal   Collection Time: 12/20/15 10:46 AM  Result Value Ref Range   Cholesterol 118 0 - 200 mg/dL    Comment: ATP III Classification       Desirable:  < 200 mg/dL               Borderline High:  200 - 239 mg/dL          High:  > = 240 mg/dL   Triglycerides 72.0 0.0 - 149.0 mg/dL    Comment: Normal:  <150 mg/dLBorderline High:  150 - 199 mg/dL   HDL 38.40 (L) >39.00 mg/dL   VLDL 14.4 0.0 - 40.0 mg/dL   LDL Cholesterol 65 0 - 99 mg/dL   Total CHOL/HDL Ratio 3     Comment:                Men          Women1/2 Average Risk     3.4          3.3Average Risk          5.0          4.42X Average Risk          9.6          7.13X Average Risk          15.0          11.0                       NonHDL  79.29     Comment: NOTE:  Non-HDL goal should be 30 mg/dL higher than patient's LDL goal (i.e. LDL goal of < 70 mg/dL, would have non-HDL goal of < 100 mg/dL)  Basic metabolic panel     Status: Abnormal   Collection Time: 12/20/15 10:46 AM  Result Value Ref Range   Sodium 140 135 - 145  mEq/L   Potassium 4.6 3.5 - 5.1 mEq/L   Chloride 106 96 - 112 mEq/L   CO2 27 19 - 32 mEq/L   Glucose, Bld 117 (H) 70 - 99 mg/dL   BUN 19 6 - 23 mg/dL   Creatinine, Ser 1.10 0.40 - 1.50 mg/dL   Calcium 8.6 8.4 - 10.5 mg/dL   GFR 67.99 >60.00 mL/min  TSH     Status: None   Collection Time: 12/20/15 10:46 AM  Result Value Ref Range   TSH 1.17 0.35 - 4.50 uIU/mL  Hepatic function panel     Status: Abnormal   Collection Time: 12/20/15 10:46 AM  Result Value Ref Range   Total Bilirubin 0.5 0.2 - 1.2 mg/dL   Bilirubin, Direct 0.1 0.0 - 0.3 mg/dL   Alkaline Phosphatase 59 39 - 117 U/L   AST 15 0 - 37 U/L   ALT 9 0 - 53 U/L   Total Protein 5.9 (L) 6.0 - 8.3 g/dL   Albumin 3.7 3.5 - 5.2 g/dL  CBC with Differential/Platelet     Status: Abnormal   Collection Time: 12/20/15 10:46 AM  Result Value Ref Range   WBC 5.8 4.0 - 10.5 K/uL   RBC 3.91 (L) 4.22 - 5.81 Mil/uL   Hemoglobin 11.6 (L) 13.0 - 17.0 g/dL   HCT 35.3 (L) 39.0 - 52.0 %   MCV 90.1 78.0 - 100.0 fl   MCHC 32.8 30.0 - 36.0 g/dL   RDW 15.1 11.5 - 15.5 %   Platelets 219.0 150.0 - 400.0 K/uL   Neutrophils Relative % 67.4 43.0 - 77.0 %   Lymphocytes Relative 18.1 12.0 - 46.0 %   Monocytes Relative 11.5 3.0 - 12.0 %   Eosinophils Relative 2.4 0.0 - 5.0 %   Basophils Relative 0.6 0.0 - 3.0 %   Neutro Abs 3.9 1.4 - 7.7 K/uL   Lymphs Abs 1.0 0.7 - 4.0 K/uL   Monocytes Absolute 0.7 0.1 - 1.0 K/uL   Eosinophils Absolute 0.1 0.0 - 0.7 K/uL   Basophils Absolute 0.0 0.0 - 0.1 K/uL  PSA, Medicare     Status: None   Collection Time: 12/20/15 10:46 AM  Result Value Ref Range   PSA 3.89 0.10 - 4.00 ng/ml  CBC     Status: Abnormal   Collection Time: 01/21/16  7:50 AM  Result Value Ref Range   WBC 8.1 4.0 - 10.5 K/uL   RBC 3.71 (L) 4.22 - 5.81 MIL/uL   Hemoglobin 10.6 (L) 13.0 - 17.0 g/dL   HCT 33.8 (L) 39.0 - 52.0 %   MCV 91.1 78.0 - 100.0 fL   MCH 28.6 26.0 - 34.0 pg   MCHC 31.4 30.0 - 36.0 g/dL   RDW 14.2 11.5 - 15.5 %    Platelets 254 150 - 400 K/uL  Basic metabolic panel     Status: Abnormal   Collection Time: 01/21/16  7:50 AM  Result Value Ref Range   Sodium 138 135 - 145 mmol/L   Potassium 4.2 3.5 - 5.1 mmol/L   Chloride 109 101 - 111 mmol/L   CO2 25 22 -  32 mmol/L   Glucose, Bld 141 (H) 65 - 99 mg/dL   BUN 16 6 - 20 mg/dL   Creatinine, Ser 1.19 0.61 - 1.24 mg/dL   Calcium 8.4 (L) 8.9 - 10.3 mg/dL   GFR calc non Af Amer 55 (L) >60 mL/min   GFR calc Af Amer >60 >60 mL/min    Comment: (NOTE) The eGFR has been calculated using the CKD EPI equation. This calculation has not been validated in all clinical situations. eGFR's persistently <60 mL/min signify possible Chronic Kidney Disease.    Anion gap 4 (L) 5 - 15  Protime-INR     Status: Abnormal   Collection Time: 01/21/16  7:50 AM  Result Value Ref Range   Prothrombin Time 35.0 (H) 11.4 - 15.2 seconds   INR 3.38   Sample to Blood Bank     Status: None   Collection Time: 01/21/16  7:50 AM  Result Value Ref Range   Blood Bank Specimen BB HOLD    Sample Expiration 01/24/2016   POC occult blood, ED Provider will collect     Status: Abnormal   Collection Time: 01/21/16  8:05 AM  Result Value Ref Range   Fecal Occult Bld POSITIVE (A) NEGATIVE  ABO/Rh     Status: None   Collection Time: 01/21/16  8:45 AM  Result Value Ref Range   ABO/RH(D) A POS   Prepare fresh frozen plasma     Status: None   Collection Time: 01/21/16  9:00 AM  Result Value Ref Range   ISSUE DATE / TIME [id number]    Blood Product Unit Number [id number]    PRODUCT CODE [id number]    Unit Type and Rh 6200    Blood Product Expiration Date [id number]    ISSUE DATE / TIME 532992426834    Blood Product Unit Number [id number]    PRODUCT CODE [id number]    Unit Type and Rh 6200    Blood Product Expiration Date [id number]   Hemoglobin and hematocrit, blood     Status: Abnormal   Collection Time: 01/21/16 10:45 AM  Result Value Ref Range   Hemoglobin 9.9 (L) 13.0 -  17.0 g/dL   HCT 31.4 (L) 39.0 - 52.0 %  Protime-INR     Status: Abnormal   Collection Time: 01/21/16 10:45 AM  Result Value Ref Range   Prothrombin Time 34.0 (H) 11.4 - 15.2 seconds   INR 3.26   Iron and TIBC     Status: None   Collection Time: 01/21/16 10:45 AM  Result Value Ref Range   Iron 73 45 - 182 ug/dL   TIBC 305 250 - 450 ug/dL   Saturation Ratios 24 17.9 - 39.5 %   UIBC 232 ug/dL    Comment: Performed at [facility], [place]. [address], [place], [place] [address]  Ferritin     Status: Abnormal   Collection Time: 01/21/16 10:45 AM  Result Value Ref Range   Ferritin 21 (L) 24 - 336 ng/mL    Comment: Performed at [facility], [place] [address] [place], [place] [address]  Transferrin     Status: None   Collection Time: 01/21/16 10:45 AM  Result Value Ref Range   Transferrin 218 180 - 329 mg/dL    Comment: Performed at [facility] [address], [place], [place] [address]  Glucose, capillary     Status: Abnormal   Collection Time: 01/21/16 11:41 AM  Result Value Ref Range   Glucose-Capillary 137 (  H) 65 - 99 mg/dL  Hemoglobin and hematocrit, blood     Status: Abnormal   Collection Time: 01/21/16  4:54 PM  Result Value Ref Range   Hemoglobin 8.4 (L) 13.0 - 17.0 g/dL   HCT 26.3 (L) 39.0 - 52.0 %  Glucose, capillary     Status: Abnormal   Collection Time: 01/21/16  5:24 PM  Result Value Ref Range   Glucose-Capillary 126 (H) 65 - 99 mg/dL  Type and screen [place]     Status: None   Collection Time: 01/21/16  7:39 PM  Result Value Ref Range   ABO/RH(D) A POS    Antibody Screen NEG    Sample Expiration 01/24/2016   Hemoglobin and hematocrit, blood     Status: Abnormal   Collection Time: 01/21/16 10:41 PM  Result Value Ref Range   Hemoglobin 9.5 (L) 13.0 - 17.0 g/dL   HCT 29.8 (L) 39.0 - 52.0 %  Glucose, capillary     Status: Abnormal   Collection Time: 01/21/16 10:44 PM  Result Value Ref Range   Glucose-Capillary 128 (H) 65 -  99 mg/dL  CBC     Status: Abnormal   Collection Time: 01/22/16  5:34 AM  Result Value Ref Range   WBC 6.6 4.0 - 10.5 K/uL   RBC 2.84 (L) 4.22 - 5.81 MIL/uL   Hemoglobin 8.4 (L) 13.0 - 17.0 g/dL   HCT 25.9 (L) 39.0 - 52.0 %   MCV 91.2 78.0 - 100.0 fL   MCH 29.6 26.0 - 34.0 pg   MCHC 32.4 30.0 - 36.0 g/dL   RDW 14.3 11.5 - 15.5 %   Platelets 227 150 - 400 K/uL  Protime-INR     Status: Abnormal   Collection Time: 01/22/16  5:34 AM  Result Value Ref Range   Prothrombin Time 16.7 (H) 11.4 - 15.2 seconds   INR 1.35   Glucose, capillary     Status: Abnormal   Collection Time: 01/22/16  7:31 AM  Result Value Ref Range   Glucose-Capillary 117 (H) 65 - 99 mg/dL  Glucose, capillary     Status: Abnormal   Collection Time: 01/22/16 11:29 AM  Result Value Ref Range   Glucose-Capillary 113 (H) 65 - 99 mg/dL  Glucose, capillary     Status: Abnormal   Collection Time: 01/22/16  4:59 PM  Result Value Ref Range   Glucose-Capillary 165 (H) 65 - 99 mg/dL  Glucose, capillary     Status: Abnormal   Collection Time: 01/22/16 10:30 PM  Result Value Ref Range   Glucose-Capillary 118 (H) 65 - 99 mg/dL  CBC with Differential/Platelet     Status: Abnormal   Collection Time: 01/23/16  5:15 AM  Result Value Ref Range   WBC 6.9 4.0 - 10.5 K/uL   RBC 2.73 (L) 4.22 - 5.81 MIL/uL   Hemoglobin 7.9 (L) 13.0 - 17.0 g/dL   HCT 25.0 (L) 39.0 - 52.0 %   MCV 91.6 78.0 - 100.0 fL   MCH 28.9 26.0 - 34.0 pg   MCHC 31.6 30.0 - 36.0 g/dL   RDW 14.5 11.5 - 15.5 %   Platelets 215 150 - 400 K/uL   Neutrophils Relative % 65 %   Neutro Abs 4.5 1.7 - 7.7 K/uL   Lymphocytes Relative 21 %   Lymphs Abs 1.4 0.7 - 4.0 K/uL   Monocytes Relative 10 %   Monocytes Absolute 0.7 0.1 - 1.0 K/uL   Eosinophils Relative 4 %  Eosinophils Absolute 0.2 0.0 - 0.7 K/uL   Basophils Relative 0 %   Basophils Absolute 0.0 0.0 - 0.1 K/uL  Comprehensive metabolic panel     Status: Abnormal   Collection Time: 01/23/16  5:15 AM  Result  Value Ref Range   Sodium 137 135 - 145 mmol/L   Potassium 4.2 3.5 - 5.1 mmol/L   Chloride 107 101 - 111 mmol/L   CO2 26 22 - 32 mmol/L   Glucose, Bld 118 (H) 65 - 99 mg/dL   BUN 11 6 - 20 mg/dL   Creatinine, Ser 1.28 (H) 0.61 - 1.24 mg/dL   Calcium 8.0 (L) 8.9 - 10.3 mg/dL   Total Protein 5.2 (L) 6.5 - 8.1 g/dL   Albumin 3.0 (L) 3.5 - 5.0 g/dL   AST 18 15 - 41 U/L   ALT 10 (L) 17 - 63 U/L   Alkaline Phosphatase 48 38 - 126 U/L   Total Bilirubin 0.5 0.3 - 1.2 mg/dL   GFR calc non Af Amer 50 (L) >60 mL/min   GFR calc Af Amer 58 (L) >60 mL/min    Comment: (NOTE) The eGFR has been calculated using the CKD EPI equation. This calculation has not been validated in all clinical situations. eGFR's persistently <60 mL/min signify possible Chronic Kidney Disease.    Anion gap 4 (L) 5 - 15  Magnesium     Status: None   Collection Time: 01/23/16  5:15 AM  Result Value Ref Range   Magnesium 1.8 1.7 - 2.4 mg/dL  Phosphorus     Status: Abnormal   Collection Time: 01/23/16  5:15 AM  Result Value Ref Range   Phosphorus 2.4 (L) 2.5 - 4.6 mg/dL  Protime-INR     Status: Abnormal   Collection Time: 01/23/16  5:15 AM  Result Value Ref Range   Prothrombin Time 15.3 (H) 11.4 - 15.2 seconds   INR 1.20   Glucose, capillary     Status: Abnormal   Collection Time: 01/23/16  7:56 AM  Result Value Ref Range   Glucose-Capillary 153 (H) 65 - 99 mg/dL  Glucose, capillary     Status: Abnormal   Collection Time: 01/23/16 11:38 AM  Result Value Ref Range   Glucose-Capillary 129 (H) 65 - 99 mg/dL  POCT INR     Status: None   Collection Time: 01/24/16 12:00 AM  Result Value Ref Range   INR 1.1   CBC w/Diff     Status: Abnormal   Collection Time: 01/25/16 12:08 PM  Result Value Ref Range   WBC 6.9 4.0 - 10.5 K/uL   RBC 2.89 (L) 4.22 - 5.81 Mil/uL   Hemoglobin 8.4 Repeated and verified X2. (L) 13.0 - 17.0 g/dL   HCT 25.7 Repeated and verified X2. (L) 39.0 - 52.0 %   MCV 89.2 78.0 - 100.0 fl   MCHC  32.8 30.0 - 36.0 g/dL   RDW 15.5 11.5 - 15.5 %   Platelets 253.0 150.0 - 400.0 K/uL   Neutrophils Relative % 68.9 43.0 - 77.0 %   Lymphocytes Relative 15.9 12.0 - 46.0 %   Monocytes Relative 12.6 (H) 3.0 - 12.0 %   Eosinophils Relative 2.3 0.0 - 5.0 %   Basophils Relative 0.3 0.0 - 3.0 %   Neutro Abs 4.8 1.4 - 7.7 K/uL   Lymphs Abs 1.1 0.7 - 4.0 K/uL   Monocytes Absolute 0.9 0.1 - 1.0 K/uL   Eosinophils Absolute 0.2 0.0 - 0.7 K/uL   Basophils Absolute  0.0 0.0 - 0.1 K/uL    Assessment/Plan: 1. Flu-like symptoms Flu swab negative. However + exposure and classic symptoms. Giving risks of not-treating, will start Tamilfu. CrCl calculated and adjusted for BMI -- 55. Rx Tamiflu at correct dose for CrCl. Supportive measures discussed. FU scheduled 2 days to keep close eye on things. Strict ER precautions discussed with patient.  - POCT Influenza A/B - CBC  2. Anemia, unspecified type Repeat CBC today to ensure hgb continues to rise. No residual symptoms. FU with specialists as scheduled.     Leeanne Rio, PA-C

## 2016-01-31 NOTE — Telephone Encounter (Signed)
Patient calling to report [facility] is out of oseltamivir (TAMIFLU) 30 MG capsule and they potentially may have more in stock tomorrow.  Patient wants to know if [name] thinks it would be best for him to wait until pharmacy has more in stock or if it would be more beneficial for him to try getting medication filled at another pharmacy.  If so, please send rx to [facility] in [place] instead and notify patient of recommendation.

## 2016-02-01 ENCOUNTER — Other Ambulatory Visit: Payer: Self-pay

## 2016-02-01 ENCOUNTER — Telehealth: Payer: Self-pay | Admitting: Gastroenterology

## 2016-02-01 DIAGNOSIS — R9389 Abnormal findings on diagnostic imaging of other specified body structures: Secondary | ICD-10-CM

## 2016-02-02 ENCOUNTER — Ambulatory Visit (INDEPENDENT_AMBULATORY_CARE_PROVIDER_SITE_OTHER): Payer: Medicare Other | Admitting: Physician Assistant

## 2016-02-02 ENCOUNTER — Encounter: Payer: Self-pay | Admitting: Physician Assistant

## 2016-02-02 VITALS — BP 128/80 | HR 82 | Temp 98.5°F | Resp 16 | Ht 71.0 in | Wt 262.0 lb

## 2016-02-02 DIAGNOSIS — J111 Influenza due to unidentified influenza virus with other respiratory manifestations: Secondary | ICD-10-CM | POA: Diagnosis not present

## 2016-02-02 NOTE — Progress Notes (Signed)
Patient presents to clinic today c/o for 2 day follow-up of flu-like symptoms. Patient is taking Tamiflu as directed. Is staying well-hydrated and getting plenty of rest. Denies residual fever or aches. Some cough without congestion. Denies new or worsening symptoms.  Past Medical History:  Diagnosis Date  . Anemia   . Atrial fibrillation ([facility])   . Blood transfusion without reported diagnosis   . Chronic constipation    colace helps  . Chronic renal insufficiency, stage II (mild)    CrCl '@60'$ .  . Chronic venous insufficiency   . Diabetes mellitus without complication (HCC)    hx of good control  . GI bleed    slow/occult: pan-endoscopy + capsule endoscopy showed no site of bleeding.  Takes one iron tab a day and Hb has been stable.  . Gout   . Hyperlipidemia   . Hypertension   . Varicose veins of both lower extremities with complications   . Vitamin B12 deficiency     Current Outpatient Prescriptions on File Prior to Visit  Medication Sig Dispense Refill  . allopurinol (ZYLOPRIM) 300 MG tablet Take 1 tablet (300 mg total) by mouth daily. 90 tablet 3  . aspirin EC 325 MG tablet Take 1 tablet (325 mg total) by mouth daily. 30 tablet 0  . bisacodyl (DULCOLAX) 5 MG EC tablet Take 2 tablets (10 mg total) by mouth daily as needed for moderate constipation. 30 tablet 0  . cyanocobalamin (,VITAMIN B-12,) 1000 MCG/ML injection Inject 1 mL (1,000 mcg total) into the muscle every 30 (thirty) days. 1 mL 1  . ferrous sulfate 325 (65 FE) MG tablet Take 325 mg by mouth daily.     Marland Kitchen lisinopril (PRINIVIL,ZESTRIL) 2.5 MG tablet TAKE 1 TABLET BY MOUTH EVERY DAY 90 tablet 0  . metFORMIN (GLUCOPHAGE) 500 MG tablet TAKE 1 TABLET DAILY 90 tablet 1  . metoprolol succinate (TOPROL-XL) 50 MG 24 hr tablet Take 1 tablet (50 mg total) by mouth daily. 90 tablet 3  . oseltamivir (TAMIFLU) 30 MG capsule Take 1 capsule (30 mg total) by mouth 2 (two) times daily. 10 capsule 0  . psyllium (METAMUCIL SMOOTH  TEXTURE) 28 % packet Take 1 packet by mouth 2 (two) times daily. 60 packet 0  . simvastatin (ZOCOR) 20 MG tablet Take 1 tablet (20 mg total) by mouth daily. 90 tablet 3   No current facility-administered medications on file prior to visit.     Allergies  Allergen Reactions  . Influenza Vac Split [Flu Virus Vaccine] Nausea And Vomiting    Family History  Problem Relation Age of Onset  . Alcohol abuse Father   . Diabetes Father   . Obesity Brother     Social History   Social History  . Marital status: Married    Spouse name: N/A  . Number of children: N/A  . Years of education: N/A   Social History Main Topics  . Smoking status: Former Smoker    Types: Cigarettes  . Smokeless tobacco: Never Used  . Alcohol use No  . Drug use: No  . Sexual activity: Not Asked   Other Topics Concern  . None   Social History Narrative   Married, 3 daughters, 4 grandchildren.      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   Retired 2012.      Remote smoking hx: quit 55 yrs ago.  No alcohol in 55 yrs.  Review of Systems - See HPI.  All other ROS are negative.  BP 128/80   Pulse 82   Temp 98.5 F (36.9 C) (Oral)   Resp 16   Ht '5\' 11"'$  (1.803 m)   Wt 262 lb (118.8 kg)   SpO2 98%   BMI 36.54 kg/m   Physical Exam  Constitutional: He is well-developed, well-nourished, and in no distress.  HENT:  Head: Normocephalic and atraumatic.  Right Ear: External ear normal.  Left Ear: External ear normal.  Nose: Nose normal.  Mouth/Throat: Oropharynx is clear and moist. No oropharyngeal exudate.  Eyes: Conjunctivae are normal.  Neck: Neck supple.  Cardiovascular: Normal rate, regular rhythm, normal heart sounds and intact distal pulses.   Pulmonary/Chest: Effort normal and breath sounds normal. No respiratory distress. He has no wheezes. He has no rales. He exhibits no tenderness.  Skin: Skin is warm and dry. No rash  noted.  Vitals reviewed.   Recent Results (from the past 2160 hour(s))  POCT INR     Status: None   Collection Time: 12/13/15 12:00 AM  Result Value Ref Range   INR 3.3   Hemoglobin A1c     Status: None   Collection Time: 12/20/15 10:46 AM  Result Value Ref Range   Hgb A1c MFr Bld 5.8 4.6 - 6.5 %    Comment: Glycemic Control Guidelines for People with Diabetes:Non Diabetic:  <6%Goal of Therapy: <7%Additional Action Suggested:  >8%   Lipid panel     Status: Abnormal   Collection Time: 12/20/15 10:46 AM  Result Value Ref Range   Cholesterol 118 0 - 200 mg/dL    Comment: ATP III Classification       Desirable:  < 200 mg/dL               Borderline High:  200 - 239 mg/dL          High:  > = 240 mg/dL   Triglycerides 72.0 0.0 - 149.0 mg/dL    Comment: Normal:  <150 mg/dLBorderline High:  150 - 199 mg/dL   HDL 38.40 (L) >39.00 mg/dL   VLDL 14.4 0.0 - 40.0 mg/dL   LDL Cholesterol 65 0 - 99 mg/dL   Total CHOL/HDL Ratio 3     Comment:                Men          Women1/2 Average Risk     3.4          3.3Average Risk          5.0          4.42X Average Risk          9.6          7.13X Average Risk          15.0          11.0                       NonHDL 79.29     Comment: NOTE:  Non-HDL goal should be 30 mg/dL higher than patient's LDL goal (i.e. LDL goal of < 70 mg/dL, would have non-HDL goal of < 100 mg/dL)  Basic metabolic panel     Status: Abnormal   Collection Time: 12/20/15 10:46 AM  Result Value Ref Range   Sodium 140 135 - 145 mEq/L   Potassium 4.6 3.5 - 5.1 mEq/L   Chloride 106 96 - 112  mEq/L   CO2 27 19 - 32 mEq/L   Glucose, Bld 117 (H) 70 - 99 mg/dL   BUN 19 6 - 23 mg/dL   Creatinine, Ser 1.57 0.40 - 1.50 mg/dL   Calcium 8.6 8.4 - 87.3 mg/dL   GFR 94.76 >93.46 mL/min  TSH     Status: None   Collection Time: 12/20/15 10:46 AM  Result Value Ref Range   TSH 1.17 0.35 - 4.50 uIU/mL  Hepatic function panel     Status: Abnormal   Collection Time: 12/20/15 10:46 AM  Result  Value Ref Range   Total Bilirubin 0.5 0.2 - 1.2 mg/dL   Bilirubin, Direct 0.1 0.0 - 0.3 mg/dL   Alkaline Phosphatase 59 39 - 117 U/L   AST 15 0 - 37 U/L   ALT 9 0 - 53 U/L   Total Protein 5.9 (L) 6.0 - 8.3 g/dL   Albumin 3.7 3.5 - 5.2 g/dL  CBC with Differential/Platelet     Status: Abnormal   Collection Time: 12/20/15 10:46 AM  Result Value Ref Range   WBC 5.8 4.0 - 10.5 K/uL   RBC 3.91 (L) 4.22 - 5.81 Mil/uL   Hemoglobin 11.6 (L) 13.0 - 17.0 g/dL   HCT 25.3 (L) 10.3 - 37.5 %   MCV 90.1 78.0 - 100.0 fl   MCHC 32.8 30.0 - 36.0 g/dL   RDW 26.1 61.1 - 96.3 %   Platelets 219.0 150.0 - 400.0 K/uL   Neutrophils Relative % 67.4 43.0 - 77.0 %   Lymphocytes Relative 18.1 12.0 - 46.0 %   Monocytes Relative 11.5 3.0 - 12.0 %   Eosinophils Relative 2.4 0.0 - 5.0 %   Basophils Relative 0.6 0.0 - 3.0 %   Neutro Abs 3.9 1.4 - 7.7 K/uL   Lymphs Abs 1.0 0.7 - 4.0 K/uL   Monocytes Absolute 0.7 0.1 - 1.0 K/uL   Eosinophils Absolute 0.1 0.0 - 0.7 K/uL   Basophils Absolute 0.0 0.0 - 0.1 K/uL  PSA, Medicare     Status: None   Collection Time: 12/20/15 10:46 AM  Result Value Ref Range   PSA 3.89 0.10 - 4.00 ng/ml  CBC     Status: Abnormal   Collection Time: 01/21/16  7:50 AM  Result Value Ref Range   WBC 8.1 4.0 - 10.5 K/uL   RBC 3.71 (L) 4.22 - 5.81 MIL/uL   Hemoglobin 10.6 (L) 13.0 - 17.0 g/dL   HCT 81.2 (L) 03.2 - 25.2 %   MCV 91.1 78.0 - 100.0 fL   MCH 28.6 26.0 - 34.0 pg   MCHC 31.4 30.0 - 36.0 g/dL   RDW 01.3 39.7 - 88.5 %   Platelets 254 150 - 400 K/uL  Basic metabolic panel     Status: Abnormal   Collection Time: 01/21/16  7:50 AM  Result Value Ref Range   Sodium 138 135 - 145 mmol/L   Potassium 4.2 3.5 - 5.1 mmol/L   Chloride 109 101 - 111 mmol/L   CO2 25 22 - 32 mmol/L   Glucose, Bld 141 (H) 65 - 99 mg/dL   BUN 16 6 - 20 mg/dL   Creatinine, Ser 0.93 0.61 - 1.24 mg/dL   Calcium 8.4 (L) 8.9 - 10.3 mg/dL   GFR calc non Af Amer 55 (L) >60 mL/min   GFR calc Af Amer >60 >60  mL/min    Comment: (NOTE) The eGFR has been calculated using the CKD EPI equation. This calculation has not been validated  in all clinical situations. eGFR's persistently <60 mL/min signify possible Chronic Kidney Disease.    Anion gap 4 (L) 5 - 15  Protime-INR     Status: Abnormal   Collection Time: 01/21/16  7:50 AM  Result Value Ref Range   Prothrombin Time 35.0 (H) 11.4 - 15.2 seconds   INR 3.38   Sample to Blood Bank     Status: None   Collection Time: 01/21/16  7:50 AM  Result Value Ref Range   Blood Bank Specimen BB HOLD    Sample Expiration 01/24/2016   POC occult blood, ED Provider will collect     Status: Abnormal   Collection Time: 01/21/16  8:05 AM  Result Value Ref Range   Fecal Occult Bld POSITIVE (A) NEGATIVE  ABO/Rh     Status: None   Collection Time: 01/21/16  8:45 AM  Result Value Ref Range   ABO/RH(D) A POS   Prepare fresh frozen plasma     Status: None   Collection Time: 01/21/16  9:00 AM  Result Value Ref Range   ISSUE DATE / TIME [id number]    Blood Product Unit Number [id number]    PRODUCT CODE [id number]    Unit Type and Rh 6200    Blood Product Expiration Date 903833383291    ISSUE DATE / TIME 916606004599    Blood Product Unit Number [id number]    PRODUCT CODE [id number]    Unit Type and Rh 6200    Blood Product Expiration Date [id number]   Hemoglobin and hematocrit, blood     Status: Abnormal   Collection Time: 01/21/16 10:45 AM  Result Value Ref Range   Hemoglobin 9.9 (L) 13.0 - 17.0 g/dL   HCT 31.4 (L) 39.0 - 52.0 %  Protime-INR     Status: Abnormal   Collection Time: 01/21/16 10:45 AM  Result Value Ref Range   Prothrombin Time 34.0 (H) 11.4 - 15.2 seconds   INR 3.26   Iron and TIBC     Status: None   Collection Time: 01/21/16 10:45 AM  Result Value Ref Range   Iron 73 45 - 182 ug/dL   TIBC 305 250 - 450 ug/dL   Saturation Ratios 24 17.9 - 39.5 %   UIBC 232 ug/dL    Comment: Performed at [facility], [place]. [address] [place], [place] [address]  Ferritin     Status: Abnormal   Collection Time: 01/21/16 10:45 AM  Result Value Ref Range   Ferritin 21 (L) 24 - 336 ng/mL    Comment: Performed at [facility], [place] [address], [place], [place] [address]  Transferrin     Status: None   Collection Time: 01/21/16 10:45 AM  Result Value Ref Range   Transferrin 218 180 - 329 mg/dL    Comment: Performed at [facility] [address], [place], [place] [address]  Glucose, capillary     Status: Abnormal   Collection Time: 01/21/16 11:41 AM  Result Value Ref Range   Glucose-Capillary 137 (H) 65 - 99 mg/dL  Hemoglobin and hematocrit, blood     Status: Abnormal   Collection Time: 01/21/16  4:54 PM  Result Value Ref Range   Hemoglobin 8.4 (L) 13.0 - 17.0 g/dL   HCT 26.3 (L) 39.0 - 52.0 %  Glucose, capillary     Status: Abnormal   Collection Time: 01/21/16  5:24 PM  Result Value Ref Range   Glucose-Capillary 126 (H) 65 - 99 mg/dL  Type and screen [place]     Status: None   Collection Time: 01/21/16  7:39 PM  Result Value Ref Range   ABO/RH(D) A POS    Antibody Screen NEG    Sample Expiration 01/24/2016   Hemoglobin and hematocrit, blood     Status: Abnormal   Collection Time: 01/21/16 10:41 PM  Result Value Ref Range   Hemoglobin 9.5 (L) 13.0 - 17.0 g/dL   HCT 29.8 (L) 39.0 - 52.0 %  Glucose, capillary     Status: Abnormal   Collection Time: 01/21/16 10:44 PM  Result Value Ref Range   Glucose-Capillary 128 (H) 65 - 99 mg/dL  CBC     Status: Abnormal   Collection Time: 01/22/16  5:34 AM  Result Value Ref Range   WBC 6.6 4.0 - 10.5 K/uL   RBC 2.84 (L) 4.22 - 5.81 MIL/uL   Hemoglobin 8.4 (L) 13.0 - 17.0 g/dL   HCT 25.9 (L) 39.0 - 52.0 %   MCV 91.2 78.0 - 100.0 fL   MCH 29.6 26.0 - 34.0 pg   MCHC 32.4 30.0 - 36.0 g/dL   RDW 14.3 11.5 - 15.5 %   Platelets 227 150 - 400 K/uL  Protime-INR     Status: Abnormal   Collection Time: 01/22/16  5:34 AM  Result Value Ref Range    Prothrombin Time 16.7 (H) 11.4 - 15.2 seconds   INR 1.35   Glucose, capillary     Status: Abnormal   Collection Time: 01/22/16  7:31 AM  Result Value Ref Range   Glucose-Capillary 117 (H) 65 - 99 mg/dL  Glucose, capillary     Status: Abnormal   Collection Time: 01/22/16 11:29 AM  Result Value Ref Range   Glucose-Capillary 113 (H) 65 - 99 mg/dL  Glucose, capillary     Status: Abnormal   Collection Time: 01/22/16  4:59 PM  Result Value Ref Range   Glucose-Capillary 165 (H) 65 - 99 mg/dL  Glucose, capillary     Status: Abnormal   Collection Time: 01/22/16 10:30 PM  Result Value Ref Range   Glucose-Capillary 118 (H) 65 - 99 mg/dL  CBC with Differential/Platelet     Status: Abnormal   Collection Time: 01/23/16  5:15 AM  Result Value Ref Range   WBC 6.9 4.0 - 10.5 K/uL   RBC 2.73 (L) 4.22 - 5.81 MIL/uL   Hemoglobin 7.9 (L) 13.0 - 17.0 g/dL   HCT 25.0 (L) 39.0 - 52.0 %   MCV 91.6 78.0 - 100.0 fL   MCH 28.9 26.0 - 34.0 pg   MCHC 31.6 30.0 - 36.0 g/dL   RDW 14.5 11.5 - 15.5 %   Platelets 215 150 - 400 K/uL   Neutrophils Relative % 65 %   Neutro Abs 4.5 1.7 - 7.7 K/uL   Lymphocytes Relative 21 %   Lymphs Abs 1.4 0.7 - 4.0 K/uL   Monocytes Relative 10 %   Monocytes Absolute 0.7 0.1 - 1.0 K/uL   Eosinophils Relative 4 %   Eosinophils Absolute 0.2 0.0 - 0.7 K/uL   Basophils Relative 0 %   Basophils Absolute 0.0 0.0 - 0.1 K/uL  Comprehensive metabolic panel     Status: Abnormal   Collection Time: 01/23/16  5:15 AM  Result Value Ref Range   Sodium 137 135 - 145 mmol/L   Potassium 4.2 3.5 - 5.1 mmol/L   Chloride 107 101 - 111 mmol/L   CO2 26 22 - 32 mmol/L  Glucose, Bld 118 (H) 65 - 99 mg/dL   BUN 11 6 - 20 mg/dL   Creatinine, Ser 1.28 (H) 0.61 - 1.24 mg/dL   Calcium 8.0 (L) 8.9 - 10.3 mg/dL   Total Protein 5.2 (L) 6.5 - 8.1 g/dL   Albumin 3.0 (L) 3.5 - 5.0 g/dL   AST 18 15 - 41 U/L   ALT 10 (L) 17 - 63 U/L   Alkaline Phosphatase 48 38 - 126 U/L   Total Bilirubin 0.5 0.3 -  1.2 mg/dL   GFR calc non Af Amer 50 (L) >60 mL/min   GFR calc Af Amer 58 (L) >60 mL/min    Comment: (NOTE) The eGFR has been calculated using the CKD EPI equation. This calculation has not been validated in all clinical situations. eGFR's persistently <60 mL/min signify possible Chronic Kidney Disease.    Anion gap 4 (L) 5 - 15  Magnesium     Status: None   Collection Time: 01/23/16  5:15 AM  Result Value Ref Range   Magnesium 1.8 1.7 - 2.4 mg/dL  Phosphorus     Status: Abnormal   Collection Time: 01/23/16  5:15 AM  Result Value Ref Range   Phosphorus 2.4 (L) 2.5 - 4.6 mg/dL  Protime-INR     Status: Abnormal   Collection Time: 01/23/16  5:15 AM  Result Value Ref Range   Prothrombin Time 15.3 (H) 11.4 - 15.2 seconds   INR 1.20   Glucose, capillary     Status: Abnormal   Collection Time: 01/23/16  7:56 AM  Result Value Ref Range   Glucose-Capillary 153 (H) 65 - 99 mg/dL  Glucose, capillary     Status: Abnormal   Collection Time: 01/23/16 11:38 AM  Result Value Ref Range   Glucose-Capillary 129 (H) 65 - 99 mg/dL  POCT INR     Status: None   Collection Time: 01/24/16 12:00 AM  Result Value Ref Range   INR 1.1   CBC w/Diff     Status: Abnormal   Collection Time: 01/25/16 12:08 PM  Result Value Ref Range   WBC 6.9 4.0 - 10.5 K/uL   RBC 2.89 (L) 4.22 - 5.81 Mil/uL   Hemoglobin 8.4 Repeated and verified X2. (L) 13.0 - 17.0 g/dL   HCT 25.7 Repeated and verified X2. (L) 39.0 - 52.0 %   MCV 89.2 78.0 - 100.0 fl   MCHC 32.8 30.0 - 36.0 g/dL   RDW 15.5 11.5 - 15.5 %   Platelets 253.0 150.0 - 400.0 K/uL   Neutrophils Relative % 68.9 43.0 - 77.0 %   Lymphocytes Relative 15.9 12.0 - 46.0 %   Monocytes Relative 12.6 (H) 3.0 - 12.0 %   Eosinophils Relative 2.3 0.0 - 5.0 %   Basophils Relative 0.3 0.0 - 3.0 %   Neutro Abs 4.8 1.4 - 7.7 K/uL   Lymphs Abs 1.1 0.7 - 4.0 K/uL   Monocytes Absolute 0.9 0.1 - 1.0 K/uL   Eosinophils Absolute 0.2 0.0 - 0.7 K/uL   Basophils Absolute 0.0 0.0  - 0.1 K/uL  POCT Influenza A/B     Status: Normal   Collection Time: 01/31/16  3:17 PM  Result Value Ref Range   Influenza A, POC Negative Negative   Influenza B, POC Negative Negative  CBC     Status: Abnormal   Collection Time: 01/31/16  3:30 PM  Result Value Ref Range   WBC 4.8 4.0 - 10.5 K/uL   RBC 2.97 (L) 4.22 - 5.81  Mil/uL   Platelets 195.0 150.0 - 400.0 K/uL   Hemoglobin 8.8 Repeated and verified X2. (L) 13.0 - 17.0 g/dL   HCT 27.0 (L) 39.0 - 52.0 %   MCV 91.0 78.0 - 100.0 fl   MCHC 32.5 30.0 - 36.0 g/dL   RDW 15.5 11.5 - 15.5 %    Assessment/Plan: 1. Influenza Patient doing very well. Finish course of [place]. Continue supportive measures and OTC medications. Has FU labs scheduled for next week to repeat CBC. Return precautions discussed with patient.    Leeanne Rio, PA-C

## 2016-02-02 NOTE — Progress Notes (Signed)
Pre visit review using our clinic review tool, if applicable. No additional management support is needed unless otherwise documented below in the visit note. 

## 2016-02-02 NOTE — Patient Instructions (Signed)
Please finish course of Tamiflu until complete. Stay hydrated and get plenty of rest. Continue Coridicin as directed if needed.   Please follow-up in 1 week for repeat CBC in the lab.

## 2016-02-21 ENCOUNTER — Telehealth: Payer: Self-pay | Admitting: Family Medicine

## 2016-02-21 DIAGNOSIS — Z7901 Long term (current) use of anticoagulants: Secondary | ICD-10-CM

## 2016-02-21 NOTE — Telephone Encounter (Signed)
This lab was ordered, could you please call and schedule pt a lab appointment.

## 2016-02-21 NOTE — Telephone Encounter (Signed)
Ok to schedule for PT/INR

## 2016-02-21 NOTE — Telephone Encounter (Signed)
Ok to schedule for PT/INR lab in our office?

## 2016-02-21 NOTE — Telephone Encounter (Signed)
Pt instructed per [facility]  to follow up in 2 weeks on his coum.  That was 01/23/16.   [facility] is out until 03/06/16, and all instructions and labs are to come form the PCP during this time.  Pt does not think he should wait until march.  I advised pt I would contact Dr Birdie Riddle and you all could follow up with him.  Thank you!! Pt unsure what he should do

## 2016-02-22 NOTE — Telephone Encounter (Signed)
Pt has been scheduled.  °

## 2016-02-23 ENCOUNTER — Other Ambulatory Visit (INDEPENDENT_AMBULATORY_CARE_PROVIDER_SITE_OTHER): Payer: Medicare Other

## 2016-02-23 ENCOUNTER — Other Ambulatory Visit: Payer: Self-pay | Admitting: General Practice

## 2016-02-23 DIAGNOSIS — Z7901 Long term (current) use of anticoagulants: Secondary | ICD-10-CM | POA: Diagnosis not present

## 2016-02-23 LAB — PROTIME-INR
INR: 1.1 ratio — ABNORMAL HIGH (ref 0.8–1.0)
Prothrombin Time: 11.8 s (ref 9.6–13.1)

## 2016-02-23 MED ORDER — METOPROLOL SUCCINATE ER 50 MG PO TB24: 50.0000 mg | ORAL_TABLET | Freq: Every day | ORAL | 1 refills | Status: DC

## 2016-02-23 MED ORDER — SIMVASTATIN 20 MG PO TABS: 20.0000 mg | ORAL_TABLET | Freq: Every day | ORAL | 1 refills | Status: DC

## 2016-02-23 NOTE — Addendum Note (Signed)
Addended by: Katina Dung on: 02/23/2016 10:10 AM   Modules accepted: Orders

## 2016-02-24 ENCOUNTER — Other Ambulatory Visit: Payer: Medicare Other

## 2016-02-24 DIAGNOSIS — R9389 Abnormal findings on diagnostic imaging of other specified body structures: Secondary | ICD-10-CM

## 2016-02-25 LAB — HEPATITIS B SURFACE ANTIBODY,QUALITATIVE: Hep B S Ab: NEGATIVE

## 2016-02-25 LAB — HEPATITIS C ANTIBODY: HCV AB: NEGATIVE

## 2016-02-25 LAB — HEPATITIS A ANTIBODY, TOTAL: Hep A Total Ab: REACTIVE — AB

## 2016-02-25 LAB — HEPATITIS B SURFACE ANTIGEN: Hepatitis B Surface Ag: NEGATIVE

## 2016-02-29 ENCOUNTER — Ambulatory Visit (INDEPENDENT_AMBULATORY_CARE_PROVIDER_SITE_OTHER): Payer: Medicare Other | Admitting: Gastroenterology

## 2016-02-29 ENCOUNTER — Encounter: Payer: Self-pay | Admitting: Gastroenterology

## 2016-02-29 VITALS — BP 116/64 | HR 88 | Ht 71.0 in | Wt 257.0 lb

## 2016-02-29 DIAGNOSIS — R932 Abnormal findings on diagnostic imaging of liver and biliary tract: Secondary | ICD-10-CM

## 2016-02-29 DIAGNOSIS — K626 Ulcer of anus and rectum: Secondary | ICD-10-CM | POA: Diagnosis not present

## 2016-02-29 NOTE — Patient Instructions (Signed)
If you are age 81 or older, your body mass index should be between 23-30. Your Body mass index is 35.84 kg/m. If this is out of the aforementioned range listed, please consider follow up with your Primary Care Provider.  If you are age 56 or younger, your body mass index should be between 19-25. Your Body mass index is 35.84 kg/m. If this is out of the aformentioned range listed, please consider follow up with your Primary Care Provider.   You have been scheduled for a flexible sigmoidoscopy. Please follow the written instructions given to you at your visit today. If you use inhalers (even only as needed), please bring them with you on the day of your procedure.  You will be contacted when it is time to schedule your abdominal ultrasound.  Thank you.

## 2016-02-29 NOTE — Progress Notes (Signed)
HPI :  83 y/o male with history of AF on coumadin, who presented with lower GI bleeding in mid January at [place]. CT angio initially showed diverticulosis but no active bleeding. It also showed ? cirrhosis but platelets and spleen were normal. He had FFP to reverse coumadin after his bleeding persisted and he underwent a colonoscopy.   Colonoscopy 01/22/16 - 43mm cecal polyp, 72mm ascending colon - both adenomas left sided diverticulosis, large solitary rectal ulcer with visible vessel which was cauterized. Suspected stercoral ulcer from constipation / dyssynergia.   He was referred to pelvic floor PT for constipation suspected dysyndergia, although never followed up with this appointment. He also had an [place] with elastography on 01/28/16 which showed normal appearing liver and spleen, but elastography with F3-F4 fibrosis which raises the question of cirrhosis. He had labs testing done recently showing not immune to hep B, immune to hep A, negative for hep B and hep C. Platelets normal. LFTs normal. Iron sat normal. He denies any alcohol use since age 68  He reports doing well since hospitalization and taking fiber. He occasionally needs to use rectal maneuvers to help relieve himself. He hs not had any further rectal bleeding since hospitalization. He has not been seen by pelvic floor PT as previously referred as he states his current regimen is treating constipation well.Marland Kitchen He is taking metamucil daily. He was not taking bowel regimen prior to admission.     Past Medical History:  Diagnosis Date  . Anemia   . Atrial fibrillation ([place])   . Blood transfusion without reported diagnosis   . Chronic constipation    colace helps  . Chronic renal insufficiency, stage II (mild)    CrCl @60 .  . Chronic venous insufficiency   . Diabetes mellitus without complication (HCC)    hx of good control  . Gout   . Hyperlipidemia   . Hypertension   . Stercoral ulcer of rectum   . Varicose veins of both  lower extremities with complications   . Vitamin B12 deficiency      Past Surgical History:  Procedure Laterality Date  . BUNIONECTOMY  2006  . COLONOSCOPY N/A 01/22/2016   Procedure: COLONOSCOPY;  Surgeon: Manus Gunning, MD;  Location: [facility];  Service: Gastroenterology;  Laterality: N/A;  . [place]  . TONSILLECTOMY AND ADENOIDECTOMY  1945   Family History  Problem Relation Age of Onset  . Alcohol abuse Father   . Diabetes Father   . Obesity Brother    Social History  Substance Use Topics  . Smoking status: Former Smoker    Types: Cigarettes  . Smokeless tobacco: Never Used  . Alcohol use No   Current Outpatient Prescriptions  Medication Sig Dispense Refill  . allopurinol (ZYLOPRIM) 300 MG tablet Take 1 tablet (300 mg total) by mouth daily. 90 tablet 3  . bisacodyl (DULCOLAX) 5 MG EC tablet Take 2 tablets (10 mg total) by mouth daily as needed for moderate constipation. 30 tablet 0  . cyanocobalamin (,VITAMIN B-12,) 1000 MCG/ML injection Inject 1 mL (1,000 mcg total) into the muscle every 30 (thirty) days. 1 mL 1  . ferrous sulfate 325 (65 FE) MG tablet Take 325 mg by mouth daily.     Marland Kitchen lisinopril (PRINIVIL,ZESTRIL) 2.5 MG tablet TAKE 1 TABLET BY MOUTH EVERY DAY 90 tablet 0  . metFORMIN (GLUCOPHAGE) 500 MG tablet TAKE 1 TABLET DAILY 90 tablet 1  . metoprolol succinate (TOPROL-XL) 50 MG 24 hr tablet  Take 1 tablet (50 mg total) by mouth daily. 90 tablet 1  . oseltamivir (TAMIFLU) 30 MG capsule Take 1 capsule (30 mg total) by mouth 2 (two) times daily. 10 capsule 0  . psyllium (METAMUCIL SMOOTH TEXTURE) 28 % packet Take 1 packet by mouth 2 (two) times daily. 60 packet 0  . simvastatin (ZOCOR) 20 MG tablet Take 1 tablet (20 mg total) by mouth daily. 90 tablet 1  . warfarin (COUMADIN) 5 MG tablet Take 5 mg by mouth daily.     No current facility-administered medications for this visit.    Allergies  Allergen Reactions  . Influenza Vac Split [Flu  Virus Vaccine] Nausea And Vomiting     Review of Systems: All systems reviewed and negative except where noted in HPI.   Lab Results  Component Value Date   ALT 10 (L) 01/23/2016   AST 18 01/23/2016   ALKPHOS 48 01/23/2016   BILITOT 0.5 01/23/2016    Lab Results  Component Value Date   WBC 4.8 01/31/2016   HGB 8.8 Repeated and verified X2. (L) 01/31/2016   HCT 27.0 (L) 01/31/2016   MCV 91.0 01/31/2016   PLT 195.0 01/31/2016   Lab Results  Component Value Date   CREATININE 1.28 (H) 01/23/2016   BUN 11 01/23/2016   NA 137 01/23/2016   K 4.2 01/23/2016   CL 107 01/23/2016   CO2 26 01/23/2016     Physical Exam: BP 116/64   Pulse 88   Ht 5\' 11"  (1.803 m)   Wt 257 lb (116.6 kg)   BMI 35.84 kg/m  Constitutional: Pleasant,well-developed, male in no acute distress. HEENT: Normocephalic and atraumatic. Conjunctivae are normal. No scleral icterus. Neck supple.  Cardiovascular: irregularly irregular.  Pulmonary/chest: Effort normal and breath sounds normal. No wheezing, rales or rhonchi. Abdominal: Soft, protuberant, nontender. There are no masses palpable. No hepatomegaly. Extremities: (+)1  edema Lymphadenopathy: No cervical adenopathy noted. Neurological: Alert and oriented to person place and time. Skin: Skin is warm and dry. No rashes noted. Psychiatric: Normal mood and affect. Behavior is normal.   ASSESSMENT AND PLAN: 83 year old male here for follow-up visit to address the following issues:  Stercoral ulcer - led to severe GI bleed in January, history per HPI. I suspect he has pelvic floor dyssynergia given history he provides of significant straining but declined pelvic floor PT evaluation as fiber supplementation appears to have helped constipation. No further bleeding but ulcer was quite large. I offered him a flex sig in a few months to ensure healing. If it persists he may consider pelvic floor PT, but hopefully has resolved. Does not need sedation, will just  be looking at rectum, and I don't think needs to stop coumadin for diagnostic exam. He agreed.   Abnormal liver imaging - incidentally noted on CT scan with possible cirrhosis, although platelet count and spleen appear normal as are his LFTs. He's had negative testing for hemachromatosis and viral hepatitis. He denies any history of alcohol. A follow-up ultrasound elastography showed normal liver but fibrosis score of F3 to 4, again spleen is normal. I'm not convinced he has cirrhosis given the information available and discussed options with him at this time. Will consider repeating an ultrasound again in 6 months to see if there is any interval changes, and repeat labs again at the time. He agreed.   [place] Cellar, MD [facility] Pager [phone]

## 2016-03-06 ENCOUNTER — Ambulatory Visit (INDEPENDENT_AMBULATORY_CARE_PROVIDER_SITE_OTHER): Payer: Medicare Other | Admitting: General Practice

## 2016-03-06 DIAGNOSIS — Z5181 Encounter for therapeutic drug level monitoring: Secondary | ICD-10-CM | POA: Diagnosis not present

## 2016-03-06 LAB — POCT INR: INR: 1.4

## 2016-03-06 NOTE — Patient Instructions (Signed)
Pre visit review using our clinic review tool, if applicable. No additional management support is needed unless otherwise documented below in the visit note. 

## 2016-03-06 NOTE — Progress Notes (Signed)
I Agree with this plan

## 2016-03-20 ENCOUNTER — Ambulatory Visit (INDEPENDENT_AMBULATORY_CARE_PROVIDER_SITE_OTHER): Payer: Medicare Other | Admitting: General Practice

## 2016-03-20 DIAGNOSIS — Z5181 Encounter for therapeutic drug level monitoring: Secondary | ICD-10-CM

## 2016-03-20 DIAGNOSIS — I4891 Unspecified atrial fibrillation: Secondary | ICD-10-CM

## 2016-03-20 LAB — POCT INR: INR: 2.2

## 2016-03-20 NOTE — Patient Instructions (Signed)
Pre visit review using our clinic review tool, if applicable. No additional management support is needed unless otherwise documented below in the visit note. 

## 2016-03-20 NOTE — Progress Notes (Signed)
I agree with this plan.

## 2016-03-22 LAB — HM DIABETES EYE EXAM

## 2016-03-27 ENCOUNTER — Encounter: Payer: Self-pay | Admitting: Family Medicine

## 2016-03-27 ENCOUNTER — Ambulatory Visit (INDEPENDENT_AMBULATORY_CARE_PROVIDER_SITE_OTHER): Payer: Medicare Other | Admitting: Family Medicine

## 2016-03-27 VITALS — BP 118/70 | HR 100 | Temp 98.4°F | Resp 18 | Ht 71.0 in | Wt 260.0 lb

## 2016-03-27 DIAGNOSIS — E119 Type 2 diabetes mellitus without complications: Secondary | ICD-10-CM | POA: Diagnosis not present

## 2016-03-27 LAB — CBC WITH DIFFERENTIAL/PLATELET
Basophils Absolute: 0.1 10*3/uL (ref 0.0–0.1)
Basophils Relative: 1 % (ref 0.0–3.0)
EOS PCT: 2.7 % (ref 0.0–5.0)
Eosinophils Absolute: 0.2 10*3/uL (ref 0.0–0.7)
HEMATOCRIT: 34.2 % — AB (ref 39.0–52.0)
HEMOGLOBIN: 10.9 g/dL — AB (ref 13.0–17.0)
Lymphocytes Relative: 13.2 % (ref 12.0–46.0)
Lymphs Abs: 0.8 10*3/uL (ref 0.7–4.0)
MCHC: 32 g/dL (ref 30.0–36.0)
MCV: 88.1 fl (ref 78.0–100.0)
MONO ABS: 0.8 10*3/uL (ref 0.1–1.0)
Monocytes Relative: 12.8 % — ABNORMAL HIGH (ref 3.0–12.0)
Neutro Abs: 4.1 10*3/uL (ref 1.4–7.7)
Neutrophils Relative %: 70.3 % (ref 43.0–77.0)
Platelets: 308 10*3/uL (ref 150.0–400.0)
RBC: 3.88 Mil/uL — AB (ref 4.22–5.81)
RDW: 15.7 % — ABNORMAL HIGH (ref 11.5–15.5)
WBC: 5.8 10*3/uL (ref 4.0–10.5)

## 2016-03-27 LAB — HEMOGLOBIN A1C: Hgb A1c MFr Bld: 6.1 % (ref 4.6–6.5)

## 2016-03-27 LAB — BASIC METABOLIC PANEL
BUN: 13 mg/dL (ref 6–23)
CO2: 26 mEq/L (ref 19–32)
Calcium: 8.5 mg/dL (ref 8.4–10.5)
Chloride: 106 mEq/L (ref 96–112)
Creatinine, Ser: 1.03 mg/dL (ref 0.40–1.50)
GFR: 73.3 mL/min (ref >60.00)
Glucose, Bld: 124 mg/dL — ABNORMAL HIGH (ref 70–99)
Potassium: 4.4 mEq/L (ref 3.5–5.1)
SODIUM: 139 meq/L (ref 135–145)

## 2016-03-27 NOTE — Progress Notes (Signed)
   Subjective:    Patient ID: [name], male    DOB: 04/12/1931, 83 y.o.   MRN: [id number]  HPI DM- chronic problem, on Metformin once daily.  On ACE for renal protection.  Due for eye exam (had to cancel when he was hospitalized)- done 3/21.  UTD on foot exam.  Pt has gained 3 lbs in last month.  Denies symptomatic lows.  No CP, SOB, HAs, visual changes, edema, numbness/tingling of hands/feet.   Review of Systems For ROS see HPI     Objective:   Physical Exam  Constitutional: He is oriented to person, place, and time. He appears well-developed and well-nourished. No distress.  obese  HENT:  Head: Normocephalic and atraumatic.  Eyes: Conjunctivae and EOM are normal. Pupils are equal, round, and reactive to light.  Neck: Normal range of motion. Neck supple. No thyromegaly present.  Cardiovascular: Normal rate, regular rhythm, normal heart sounds and intact distal pulses.   No murmur heard. Pulmonary/Chest: Effort normal and breath sounds normal. No respiratory distress.  Abdominal: Soft. Bowel sounds are normal. He exhibits no distension.  Musculoskeletal: He exhibits no edema.  Lymphadenopathy:    He has no cervical adenopathy.  Neurological: He is alert and oriented to person, place, and time. No cranial nerve deficit.  Skin: Skin is warm and dry.  Psychiatric: He has a normal mood and affect. His behavior is normal.  Vitals reviewed.         Assessment & Plan:

## 2016-03-27 NOTE — Progress Notes (Signed)
Pre visit review using our clinic review tool, if applicable. No additional management support is needed unless otherwise documented below in the visit note. 

## 2016-03-27 NOTE — Assessment & Plan Note (Signed)
Chronic problem.  Hx of good control.  Had eye exam done last week.  UTD on foot exam.  On ACE for renal protection.  Stressed need for healthy diet and regular exercise.  Check labs.  Adjust meds prn

## 2016-03-27 NOTE — Patient Instructions (Signed)
Follow up in 3-4 months to recheck diabetes, BP, and cholesterol We'll notify you of your lab results and make any changes if needed Continue to work on healthy diet and regular exercise Call with any questions or concerns HAPPY BIRTHDAY!!!!

## 2016-03-28 ENCOUNTER — Encounter: Payer: Self-pay | Admitting: General Practice

## 2016-03-28 ENCOUNTER — Encounter: Payer: Self-pay | Admitting: Gastroenterology

## 2016-03-30 ENCOUNTER — Telehealth: Payer: Self-pay | Admitting: Family Medicine

## 2016-03-30 NOTE — Telephone Encounter (Signed)
Pt would like a call back regarding labs on his cell phone.

## 2016-03-30 NOTE — Telephone Encounter (Signed)
Called and gave pt his lab results. Pt stated an understanding.

## 2016-04-04 ENCOUNTER — Other Ambulatory Visit: Payer: Self-pay | Admitting: General Practice

## 2016-04-04 MED ORDER — ALLOPURINOL 300 MG PO TABS: 300.0000 mg | ORAL_TABLET | Freq: Every day | ORAL | 1 refills | Status: DC

## 2016-04-11 ENCOUNTER — Ambulatory Visit (AMBULATORY_SURGERY_CENTER): Payer: Medicare Other | Admitting: Gastroenterology

## 2016-04-11 ENCOUNTER — Encounter: Payer: Self-pay | Admitting: Gastroenterology

## 2016-04-11 VITALS — BP 132/66 | HR 78 | Temp 98.0°F | Resp 19 | Ht 71.0 in | Wt 260.0 lb

## 2016-04-11 DIAGNOSIS — K626 Ulcer of anus and rectum: Secondary | ICD-10-CM | POA: Diagnosis present

## 2016-04-11 MED ORDER — SODIUM CHLORIDE 0.9 % IV SOLN: 500.0000 mL | INTRAVENOUS | Status: DC

## 2016-04-11 NOTE — Patient Instructions (Signed)
YOU HAD AN ENDOSCOPIC PROCEDURE TODAY AT THE [facility]:   Refer to the procedure report that was given to you for any specific questions about what was found during the examination.  If the procedure report does not answer your questions, please call your gastroenterologist to clarify.  If you requested that your care partner not be given the details of your procedure findings, then the procedure report has been included in a sealed envelope for you to review at your convenience later.  YOU SHOULD EXPECT: Some feelings of bloating in the abdomen. Passage of more gas than usual.  Walking can help get rid of the air that was put into your GI tract during the procedure and reduce the bloating. If you had a lower endoscopy (such as a colonoscopy or flexible sigmoidoscopy) you may notice spotting of blood in your stool or on the toilet paper. If you underwent a bowel prep for your procedure, you may not have a normal bowel movement for a few days.  Please Note:  You might notice some irritation and congestion in your nose or some drainage.  This is from the oxygen used during your procedure.  There is no need for concern and it should clear up in a day or so.  SYMPTOMS TO REPORT IMMEDIATELY:   Following lower endoscopy (colonoscopy or flexible sigmoidoscopy):  Excessive amounts of blood in the stool  Significant tenderness or worsening of abdominal pains  Swelling of the abdomen that is new, acute  Fever of 100F or higher  For urgent or emergent issues, a gastroenterologist can be reached at any hour by calling [phone].   DIET:  We do recommend a small meal at first, but then you may proceed to your regular diet.  Drink plenty of fluids but you should avoid alcoholic beverages for 24 hours.  ACTIVITY:  You should plan to take it easy for the rest of today and you should NOT DRIVE or use heavy machinery until tomorrow (because of the sedation medicines used during the test).     FOLLOW UP: Our staff will call the number listed on your records the next business day following your procedure to check on you and address any questions or concerns that you may have regarding the information given to you following your procedure. If we do not reach you, we will leave a message.  However, if you are feeling well and you are not experiencing any problems, there is no need to return our call.  We will assume that you have returned to your regular daily activities without incident.  If any biopsies were taken you will be contacted by phone or by letter within the next 1-3 weeks.  Please call us at [phone] if you have not heard about the biopsies in 3 weeks.    SIGNATURES/CONFIDENTIALITY: You and/or your care partner have signed paperwork which will be entered into your electronic medical record.  These signatures attest to the fact that that the information above on your After Visit Summary has been reviewed and is understood.  Full responsibility of the confidentiality of this discharge information lies with you and/or your care-partner. 

## 2016-04-11 NOTE — Progress Notes (Signed)
   F Pt's states no medical or surgical changes since previsit or office visit.    

## 2016-04-11 NOTE — Progress Notes (Signed)
A/ox3 pleased with MAC, report to [employer]

## 2016-04-11 NOTE — Progress Notes (Signed)
Called to room to assist during endoscopic procedure.  Patient ID and intended procedure confirmed with present staff. Received instructions for my participation in the procedure from the performing physician.  

## 2016-04-11 NOTE — Op Note (Signed)
[place] Patient Name: [name] Procedure Date: 04/11/2016 2:02 PM MRN: [id number] Endoscopist: Remo Lipps P. Armbruster MD, MD Age: 83 Referring MD:  Date of Birth: [birth year] Gender: Male Account #: [id number] Procedure:                Flexible Sigmoidoscopy Indications:              follow up of suspected stercoral ulceration,                            patient using Metamucil but still passing some hard                            stools Medicines:                None Procedure:                Pre-Anesthesia Assessment:                           - Prior to the procedure, a History and Physical                            was performed, and patient medications and                            allergies were reviewed. The patient's tolerance of                            previous anesthesia was also reviewed. The risks                            and benefits of the procedure and the sedation                            options and risks were discussed with the patient.                            All questions were answered, and informed consent                            was obtained. Prior Anticoagulants: The patient has                            taken Coumadin (warfarin), last dose was 1 day                            prior to procedure. ASA Grade Assessment: II - A                            patient with mild systemic disease. After reviewing                            the risks and benefits, the patient was deemed in  satisfactory condition to undergo the procedure.                           After obtaining informed consent, the scope was                            passed under direct vision. The Colonoscope was                            introduced through the anus and advanced to the the                            sigmoid colon. The flexible sigmoidoscopy was                            accomplished without difficulty. The patient                  tolerated the procedure well. The quality of the                            bowel preparation was good. Scope In: Scope Out: Findings:                 The perianal exam findings include darker pigmented                            skin in the perianal area with palpable difference                            in the mucosa lining at the site of ulceration.                           A single (solitary) ulcer was found in the distal                            rectum, overall roughly a few cm in size but                            smaller than previous. Clean based with no high                            risk stigmata for bleeding noted. Multiple biopsies                            were taken from the periphery of the ulcer with a                            cold forceps for histology.                           The remainder of the rectum and sigmoid colon                            appeared normal.  Complications:            No immediate complications. Estimated blood loss:                            Minimal. Estimated Blood Loss:     Estimated blood loss was minimal. Impression:               - Darker pigmented skin in the perianal area with                            palpable difference in mucosa lining at site of                            rectal ulceration found on perianal exam.                           - A single ulcer in the distal rectum as described.                            Suspect persistent stercoral ulcer based on                            symptoms, biopsied to further evaluate.                           - The remainder of the rectum and sigmoid colon are                            normal. Recommendation:           - Discharge patient to home.                           - Resume regular diet.                           - Continue present medications (Coumadin)                           - Stop Metamucil                           - Start Miralax 17gm once to twice  daily, titrate                            as needed to keep stools loose and minimize                            straining                           - Await pathology results Remo Lipps P. Armbruster MD, MD 04/11/2016 2:43:09 PM This report has been signed electronically.

## 2016-04-12 ENCOUNTER — Telehealth: Payer: Self-pay

## 2016-04-12 NOTE — Telephone Encounter (Signed)
  Follow up Call-  Call back number 04/11/2016  Post procedure Call Back phone  # [phone]  Permission to leave phone message Yes  Some recent data might be hidden     Patient questions:  Do you have a fever, pain , or abdominal swelling? No. Pain Score  0 *  Have you tolerated food without any problems? Yes.    Have you been able to return to your normal activities? Yes.    Do you have any questions about your discharge instructions: Diet   No. Medications  No. Follow up visit  No.  Do you have questions or concerns about your Care? No.  Actions: * If pain score is 4 or above: No action needed, pain <4.

## 2016-04-17 ENCOUNTER — Ambulatory Visit (INDEPENDENT_AMBULATORY_CARE_PROVIDER_SITE_OTHER): Payer: Medicare Other | Admitting: General Practice

## 2016-04-17 DIAGNOSIS — Z5181 Encounter for therapeutic drug level monitoring: Secondary | ICD-10-CM | POA: Diagnosis not present

## 2016-04-17 DIAGNOSIS — I4891 Unspecified atrial fibrillation: Secondary | ICD-10-CM

## 2016-04-17 LAB — POCT INR: INR: 3.3

## 2016-04-17 NOTE — Progress Notes (Signed)
I agree with this plan.

## 2016-04-17 NOTE — Patient Instructions (Signed)
Pre visit review using our clinic review tool, if applicable. No additional management support is needed unless otherwise documented below in the visit note. 

## 2016-04-18 ENCOUNTER — Other Ambulatory Visit: Payer: Self-pay | Admitting: Family Medicine

## 2016-04-18 NOTE — Telephone Encounter (Signed)
Refill request from [facility], [facility] came to Dr Anitra Lauth for patient's cyanocobalamin.  Routing to Dr Tabori's CMA.

## 2016-04-19 MED ORDER — CYANOCOBALAMIN 1000 MCG/ML IJ SOLN: 1000.0000 ug | INTRAMUSCULAR | 1 refills | Status: DC

## 2016-04-19 NOTE — Telephone Encounter (Signed)
Please advise, I do not see a B12 level in his chart?

## 2016-04-23 ENCOUNTER — Other Ambulatory Visit: Payer: Self-pay | Admitting: Family Medicine

## 2016-05-15 ENCOUNTER — Ambulatory Visit (INDEPENDENT_AMBULATORY_CARE_PROVIDER_SITE_OTHER): Payer: Medicare Other | Admitting: General Practice

## 2016-05-15 DIAGNOSIS — I4891 Unspecified atrial fibrillation: Secondary | ICD-10-CM

## 2016-05-15 DIAGNOSIS — Z5181 Encounter for therapeutic drug level monitoring: Secondary | ICD-10-CM

## 2016-05-15 LAB — POCT INR: INR: 3.5

## 2016-05-15 NOTE — Patient Instructions (Signed)
Pre visit review using our clinic review tool, if applicable. No additional management support is needed unless otherwise documented below in the visit note. 

## 2016-05-22 NOTE — Progress Notes (Signed)
I agree with this plan.

## 2016-05-24 ENCOUNTER — Ambulatory Visit (INDEPENDENT_AMBULATORY_CARE_PROVIDER_SITE_OTHER): Payer: Medicare Other | Admitting: General Practice

## 2016-05-24 DIAGNOSIS — I4891 Unspecified atrial fibrillation: Secondary | ICD-10-CM | POA: Diagnosis not present

## 2016-05-24 DIAGNOSIS — Z5181 Encounter for therapeutic drug level monitoring: Secondary | ICD-10-CM

## 2016-05-24 LAB — POCT INR: INR: 2.9

## 2016-05-24 NOTE — Patient Instructions (Signed)
Pre visit review using our clinic review tool, if applicable. No additional management support is needed unless otherwise documented below in the visit note. 

## 2016-05-25 NOTE — Progress Notes (Signed)
I agree with this plan.

## 2016-06-19 ENCOUNTER — Ambulatory Visit (INDEPENDENT_AMBULATORY_CARE_PROVIDER_SITE_OTHER): Payer: Medicare Other | Admitting: General Practice

## 2016-06-19 DIAGNOSIS — Z5181 Encounter for therapeutic drug level monitoring: Secondary | ICD-10-CM | POA: Diagnosis not present

## 2016-06-19 DIAGNOSIS — I4891 Unspecified atrial fibrillation: Secondary | ICD-10-CM

## 2016-06-19 LAB — POCT INR: INR: 2.3

## 2016-06-19 NOTE — Patient Instructions (Signed)
Pre visit review using our clinic review tool, if applicable. No additional management support is needed unless otherwise documented below in the visit note. 

## 2016-06-19 NOTE — Progress Notes (Signed)
I agree with this plan.

## 2016-06-22 ENCOUNTER — Other Ambulatory Visit: Payer: Self-pay

## 2016-06-22 ENCOUNTER — Telehealth: Payer: Self-pay

## 2016-06-22 DIAGNOSIS — R932 Abnormal findings on diagnostic imaging of liver and biliary tract: Secondary | ICD-10-CM

## 2016-06-22 NOTE — Telephone Encounter (Signed)
-----   Message from Alphonzo Dublin, LPN sent at 05/10/5636  1:28 PM EDT ----- Regarding: RE: Abdominal u/s   ----- Message ----- From: Alphonzo Dublin, LPN Sent: 7/56/4332  11:26 AM To: Alphonzo Dublin, LPN Subject: Abdominal u/s                                  Abd Complete ultrasound due in July for abnormal liver imaging

## 2016-06-22 NOTE — Telephone Encounter (Signed)
Appt made at [facility] for 07/10/16 at 9:30. Letter mailed to pt to inform.

## 2016-07-10 ENCOUNTER — Ambulatory Visit (HOSPITAL_COMMUNITY)
Admission: RE | Admit: 2016-07-10 | Discharge: 2016-07-10 | Disposition: A | Discharge status: Home / Self Care | Payer: Medicare Other | Source: Ambulatory Visit | Attending: Gastroenterology | Admitting: Gastroenterology

## 2016-07-10 DIAGNOSIS — R161 Splenomegaly, not elsewhere classified: Secondary | ICD-10-CM | POA: Diagnosis not present

## 2016-07-10 DIAGNOSIS — R932 Abnormal findings on diagnostic imaging of liver and biliary tract: Secondary | ICD-10-CM | POA: Diagnosis present

## 2016-07-14 ENCOUNTER — Encounter: Payer: Self-pay | Admitting: Gastroenterology

## 2016-07-17 ENCOUNTER — Ambulatory Visit (INDEPENDENT_AMBULATORY_CARE_PROVIDER_SITE_OTHER): Payer: Medicare Other | Admitting: General Practice

## 2016-07-17 DIAGNOSIS — I4891 Unspecified atrial fibrillation: Secondary | ICD-10-CM | POA: Diagnosis not present

## 2016-07-17 DIAGNOSIS — Z5181 Encounter for therapeutic drug level monitoring: Secondary | ICD-10-CM | POA: Diagnosis not present

## 2016-07-17 LAB — POCT INR: INR: 2.5

## 2016-07-17 NOTE — Patient Instructions (Signed)
Pre visit review using our clinic review tool, if applicable. No additional management support is needed unless otherwise documented below in the visit note. 

## 2016-07-17 NOTE — Progress Notes (Signed)
I agree with this plan.

## 2016-07-22 ENCOUNTER — Other Ambulatory Visit: Payer: Self-pay | Admitting: Family Medicine

## 2016-07-27 ENCOUNTER — Encounter: Payer: Self-pay | Admitting: General Practice

## 2016-07-27 ENCOUNTER — Encounter: Payer: Self-pay | Admitting: Family Medicine

## 2016-07-27 ENCOUNTER — Ambulatory Visit (INDEPENDENT_AMBULATORY_CARE_PROVIDER_SITE_OTHER): Payer: Medicare Other | Admitting: Family Medicine

## 2016-07-27 VITALS — BP 128/72 | HR 80 | Temp 98.3°F | Resp 16 | Ht 71.0 in | Wt 257.0 lb

## 2016-07-27 DIAGNOSIS — L03115 Cellulitis of right lower limb: Secondary | ICD-10-CM | POA: Diagnosis not present

## 2016-07-27 DIAGNOSIS — W11XXXA Fall on and from ladder, initial encounter: Secondary | ICD-10-CM

## 2016-07-27 DIAGNOSIS — E119 Type 2 diabetes mellitus without complications: Secondary | ICD-10-CM | POA: Diagnosis not present

## 2016-07-27 DIAGNOSIS — E785 Hyperlipidemia, unspecified: Secondary | ICD-10-CM

## 2016-07-27 DIAGNOSIS — R0781 Pleurodynia: Secondary | ICD-10-CM | POA: Diagnosis not present

## 2016-07-27 LAB — CBC WITH DIFFERENTIAL/PLATELET
BASOS ABS: 0 10*3/uL (ref 0.0–0.1)
Basophils Relative: 0.6 % (ref 0.0–3.0)
EOS ABS: 0.1 10*3/uL (ref 0.0–0.7)
Eosinophils Relative: 1.7 % (ref 0.0–5.0)
HCT: 34.6 % — ABNORMAL LOW (ref 39.0–52.0)
Hemoglobin: 11 g/dL — ABNORMAL LOW (ref 13.0–17.0)
LYMPHS ABS: 0.8 10*3/uL (ref 0.7–4.0)
Lymphocytes Relative: 12.1 % (ref 12.0–46.0)
MCHC: 31.8 g/dL (ref 30.0–36.0)
MCV: 91.3 fl (ref 78.0–100.0)
MONO ABS: 0.9 10*3/uL (ref 0.1–1.0)
MONOS PCT: 13.5 % — AB (ref 3.0–12.0)
NEUTROS ABS: 4.8 10*3/uL (ref 1.4–7.7)
NEUTROS PCT: 72.1 % (ref 43.0–77.0)
PLATELETS: 318 10*3/uL (ref 150.0–400.0)
RBC: 3.79 Mil/uL — AB (ref 4.22–5.81)
RDW: 16.4 % — ABNORMAL HIGH (ref 11.5–15.5)
WBC: 6.7 10*3/uL (ref 4.0–10.5)

## 2016-07-27 LAB — BASIC METABOLIC PANEL
BUN: 17 mg/dL (ref 6–23)
CALCIUM: 8.6 mg/dL (ref 8.4–10.5)
CO2: 27 meq/L (ref 19–32)
CREATININE: 1.11 mg/dL (ref 0.40–1.50)
Chloride: 106 mEq/L (ref 96–112)
GFR: 67.19 mL/min (ref >60.00)
GLUCOSE: 120 mg/dL — AB (ref 70–99)
Potassium: 4.7 mEq/L (ref 3.5–5.1)
Sodium: 138 mEq/L (ref 135–145)

## 2016-07-27 LAB — HEPATIC FUNCTION PANEL
ALBUMIN: 3.5 g/dL (ref 3.5–5.2)
ALK PHOS: 107 U/L (ref 39–117)
ALT: 8 U/L (ref 0–53)
AST: 13 U/L (ref 0–37)
Bilirubin, Direct: 0.3 mg/dL (ref 0.0–0.3)
TOTAL PROTEIN: 6.3 g/dL (ref 6.0–8.3)
Total Bilirubin: 0.7 mg/dL (ref 0.2–1.2)

## 2016-07-27 LAB — LIPID PANEL
CHOLESTEROL: 98 mg/dL (ref 0–200)
HDL: 39.2 mg/dL (ref >39.00)
LDL CALC: 49 mg/dL (ref 0–99)
NonHDL: 58.44
TRIGLYCERIDES: 46 mg/dL (ref 0.0–149.0)
Total CHOL/HDL Ratio: 2
VLDL: 9.2 mg/dL (ref 0.0–40.0)

## 2016-07-27 LAB — HEMOGLOBIN A1C: HEMOGLOBIN A1C: 5.9 % (ref 4.6–6.5)

## 2016-07-27 LAB — TSH: TSH: 1.04 u[IU]/mL (ref 0.35–4.50)

## 2016-07-27 MED ORDER — HYDROCODONE-ACETAMINOPHEN 5-325 MG PO TABS: 1.0000 | ORAL_TABLET | Freq: Four times a day (QID) | ORAL | 0 refills | Status: DC | PRN

## 2016-07-27 MED ORDER — DOXYCYCLINE HYCLATE 100 MG PO TABS: 100.0000 mg | ORAL_TABLET | Freq: Two times a day (BID) | ORAL | 0 refills | Status: DC

## 2016-07-27 NOTE — Progress Notes (Signed)
Pre visit review using our clinic review tool, if applicable. No additional management support is needed unless otherwise documented below in the visit note. 

## 2016-07-27 NOTE — Progress Notes (Signed)
   Subjective:    Patient ID: [name], male    DOB: 1931/05/05, 83 y.o.   MRN: [id number]  HPI Fall off ladder- occurred Friday 7/20, was on bottom step but fell over a retaining wall and the ladder fell on top of him.  Cellulitis- R knee.  Pt has large abrasion w/ surrounding redness and TTP.  DM- chronic problem, on Metformin 500mg  daily.  On ACE for renal protection.  UTD on eye exam, due for foot exam.  Hyperlipidemia- chronic problem, on Simvastatin daily   Review of Systems For ROS see HPI     Objective:   Physical Exam  Constitutional: He is oriented to person, place, and time. He appears well-developed and well-nourished. No distress.  HENT:  Head: Normocephalic and atraumatic.  Eyes: Pupils are equal, round, and reactive to light. Conjunctivae and EOM are normal.  Neck: Normal range of motion. Neck supple.  Pulmonary/Chest: Effort normal and breath sounds normal. No respiratory distress. He has no wheezes. He has no rales (TTP over R lower ribs in mid axillary line w/ overlying ecchymosis). He exhibits tenderness.  Abdominal: Soft. He exhibits no distension. There is no tenderness. There is no rebound.  Musculoskeletal: He exhibits tenderness (TTP over R rhomboid and lat but shoulder ROM WNL).  Neurological: He is alert and oriented to person, place, and time. No cranial nerve deficit. Coordination normal.  Skin: Skin is warm and dry. There is erythema (large abrasion on R knee w/ surrounding redness and warmth- no drainage or fluctuance).  Vitals reviewed.         Assessment & Plan:  Fall- pt has ecchymosis and abrasions but thankfully he did not hit his head as he is on coumadin.  Pt declines R rib xrays- 'they can't do nothing' but will start hydrocodone prn due to TTP and bruising.  Has full ROM of R shoulder.  Cautioned against use of NSAIDs while on coumadin.  Reviewed supportive care and red flags that should prompt return.  Pt expressed understanding and is  in agreement w/ plan.   Cellulitis- new.  No evidence of septic joint or hemarthrosis.  Wound was cleaned, dressed, and we will start Doxy.  Cautioned him regarding his coumadin.  Reviewed supportive care and red flags that should prompt return.  Pt expressed understanding and is in agreement w/ plan.

## 2016-07-27 NOTE — Assessment & Plan Note (Signed)
Chronic problem.  Tolerating statin.  Check labs.  Adjust meds prn  

## 2016-07-27 NOTE — Patient Instructions (Signed)
Follow up as needed/scheduled We'll notify you of your lab results and make any changes if needed Start the Doxycycline twice daily- take w/ food- for the knee infection If the redness continues to spread or worsen, let me know ASAP Use the hydrocodone as needed for severe pain- may cause drowsiness- so at the very least, take at night Use heat or ice for pain relief (whichever is more comfortable) Call with any questions or concerns Hang in there!!!

## 2016-07-27 NOTE — Assessment & Plan Note (Signed)
Chronic problem.  On Metformin w/ hx of adequate control.  UTD on eye exam, on ACE for renal protection.  Check labs.  Adjust meds prn.

## 2016-08-01 ENCOUNTER — Ambulatory Visit: Payer: Medicare Other | Admitting: Family Medicine

## 2016-08-07 ENCOUNTER — Telehealth: Payer: Self-pay | Admitting: Family Medicine

## 2016-08-07 ENCOUNTER — Encounter: Payer: Self-pay | Admitting: Physician Assistant

## 2016-08-07 ENCOUNTER — Ambulatory Visit (INDEPENDENT_AMBULATORY_CARE_PROVIDER_SITE_OTHER): Payer: Medicare Other | Admitting: Physician Assistant

## 2016-08-07 ENCOUNTER — Ambulatory Visit: Payer: Medicare Other | Admitting: Physician Assistant

## 2016-08-07 VITALS — BP 118/70 | HR 72 | Temp 98.1°F | Resp 14 | Ht 71.0 in | Wt 253.0 lb

## 2016-08-07 DIAGNOSIS — R609 Edema, unspecified: Secondary | ICD-10-CM

## 2016-08-07 MED ORDER — FUROSEMIDE 20 MG PO TABS: 20.0000 mg | ORAL_TABLET | Freq: Every day | ORAL | 0 refills | Status: DC

## 2016-08-07 NOTE — Progress Notes (Signed)
Patient presents to clinic today c/o 4-5 days of increased swelling of bilateral foot and ankles. Patient with history of chronic venous insufficiency and venous varicosities with chronic swelling. Patient states he has been keeping legs elevated which is significantly helping swelling. Denies any SOB. Denies PND or orthopnea but mostly sleeps in his chair at night. Patient denies recent change in diet but does not a diet higher in salt.   Past Medical History:  Diagnosis Date  . Anemia   . Atrial fibrillation ([place])   . Blood transfusion without reported diagnosis   . Chronic constipation    colace helps  . Chronic renal insufficiency, stage II (mild)    CrCl @60 .  . Chronic venous insufficiency   . Diabetes mellitus without complication (HCC)    hx of good control  . Gout   . Hyperlipidemia   . Hypertension   . Stercoral ulcer of rectum   . Varicose veins of both lower extremities with complications   . Vitamin B12 deficiency     Current Outpatient Prescriptions on File Prior to Visit  Medication Sig Dispense Refill  . allopurinol (ZYLOPRIM) 300 MG tablet Take 1 tablet (300 mg total) by mouth daily. 90 tablet 1  . bisacodyl (DULCOLAX) 5 MG EC tablet Take 2 tablets (10 mg total) by mouth daily as needed for moderate constipation. 30 tablet 0  . cyanocobalamin (,VITAMIN B-12,) 1000 MCG/ML injection Inject 1 mL (1,000 mcg total) into the muscle every 30 (thirty) days. 1 mL 1  . ferrous sulfate 325 (65 FE) MG tablet Take 325 mg by mouth daily.     Marland Kitchen lisinopril (PRINIVIL,ZESTRIL) 2.5 MG tablet TAKE 1 TABLET BY MOUTH EVERY DAY 90 tablet 0  . metFORMIN (GLUCOPHAGE) 500 MG tablet TAKE 1 TABLET DAILY 90 tablet 1  . metoprolol succinate (TOPROL-XL) 50 MG 24 hr tablet Take 1 tablet (50 mg total) by mouth daily. 90 tablet 1  . simvastatin (ZOCOR) 20 MG tablet Take 1 tablet (20 mg total) by mouth daily. 90 tablet 1  . warfarin (COUMADIN) 5 MG tablet Take 5 mg by mouth daily.     No current  facility-administered medications on file prior to visit.     Allergies  Allergen Reactions  . Influenza Vac Split [Flu Virus Vaccine] Nausea And Vomiting    Family History  Problem Relation Age of Onset  . Alcohol abuse Father   . Diabetes Father   . Obesity Brother     Social History   Social History  . Marital status: Married    Spouse name: N/A  . Number of children: 3  . Years of education: N/A   Occupational History  . retired    Social History Main Topics  . Smoking status: Former Smoker    Types: Cigarettes  . Smokeless tobacco: Never Used  . Alcohol use No  . Drug use: No  . Sexual activity: Not Asked   Other Topics Concern  . None   Social History Narrative   Married, 3 daughters, 4 grandchildren.      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   Retired 2012.      Remote smoking hx: quit 55 yrs ago.  No alcohol in 55 yrs.         Review of Systems - See HPI.  All other ROS are negative.  BP 118/70   Pulse 72   Temp 98.1 F (36.7 C) (Oral)  Resp 14   Ht 5\' 11"  (1.803 m)   Wt 253 lb (114.8 kg)   SpO2 96%   BMI 35.29 kg/m   Physical Exam  Constitutional: He is oriented to person, place, and time and well-developed, well-nourished, and in no distress.  HENT:  Head: Normocephalic and atraumatic.  Eyes: Conjunctivae are normal.  Neck: Neck supple.  Cardiovascular: Normal rate, regular rhythm, normal heart sounds and intact distal pulses.   Pulses:      Dorsalis pedis pulses are 2+ on the right side, and 2+ on the left side.       Posterior tibial pulses are 2+ on the right side, and 2+ on the left side.  2+ pitting edema of lower extremities bilaterally up to level of lower shin.  Pulmonary/Chest: Effort normal and breath sounds normal. No respiratory distress. He has no wheezes. He has no rales. He exhibits no tenderness.  Neurological: He is alert and oriented to person, place,  and time.  Skin: Skin is warm and dry. No rash noted.  Psychiatric: Affect normal.  Vitals reviewed.   Recent Results (from the past 2160 hour(s))  POCT INR     Status: None   Collection Time: 05/15/16 12:00 AM  Result Value Ref Range   INR 3.5   POCT INR     Status: None   Collection Time: 05/24/16 12:00 AM  Result Value Ref Range   INR 2.9   POCT INR     Status: None   Collection Time: 06/19/16 12:00 AM  Result Value Ref Range   INR 2.3   POCT INR     Status: None   Collection Time: 07/17/16 12:00 AM  Result Value Ref Range   INR 2.5   Hemoglobin A1c     Status: None   Collection Time: 07/27/16  9:42 AM  Result Value Ref Range   Hgb A1c MFr Bld 5.9 4.6 - 6.5 %    Comment: Glycemic Control Guidelines for People with Diabetes:Non Diabetic:  <6%Goal of Therapy: <7%Additional Action Suggested:  >8%   Lipid panel     Status: None   Collection Time: 07/27/16  9:42 AM  Result Value Ref Range   Cholesterol 98 0 - 200 mg/dL    Comment: ATP III Classification       Desirable:  < 200 mg/dL               Borderline High:  200 - 239 mg/dL          High:  > = 240 mg/dL   Triglycerides 46.0 0.0 - 149.0 mg/dL    Comment: Normal:  <150 mg/dLBorderline High:  150 - 199 mg/dL   HDL 39.20 >39.00 mg/dL   VLDL 9.2 0.0 - 40.0 mg/dL   LDL Cholesterol 49 0 - 99 mg/dL   Total CHOL/HDL Ratio 2     Comment:                Men          Women1/2 Average Risk     3.4          3.3Average Risk          5.0          4.42X Average Risk          9.6          7.13X Average Risk          15.0  11.0                       NonHDL 58.44     Comment: NOTE:  Non-HDL goal should be 30 mg/dL higher than patient's LDL goal (i.e. LDL goal of < 70 mg/dL, would have non-HDL goal of < 100 mg/dL)  Basic metabolic panel     Status: Abnormal   Collection Time: 07/27/16  9:42 AM  Result Value Ref Range   Sodium 138 135 - 145 mEq/L   Potassium 4.7 3.5 - 5.1 mEq/L   Chloride 106 96 - 112 mEq/L   CO2 27 19 - 32  mEq/L   Glucose, Bld 120 (H) 70 - 99 mg/dL   BUN 17 6 - 23 mg/dL   Creatinine, Ser 1.11 0.40 - 1.50 mg/dL   Calcium 8.6 8.4 - 10.5 mg/dL   GFR 67.19 >60.00 mL/min  Hepatic function panel     Status: None   Collection Time: 07/27/16  9:42 AM  Result Value Ref Range   Total Bilirubin 0.7 0.2 - 1.2 mg/dL   Bilirubin, Direct 0.3 0.0 - 0.3 mg/dL   Alkaline Phosphatase 107 39 - 117 U/L   AST 13 0 - 37 U/L   ALT 8 0 - 53 U/L   Total Protein 6.3 6.0 - 8.3 g/dL   Albumin 3.5 3.5 - 5.2 g/dL  CBC with Differential/Platelet     Status: Abnormal   Collection Time: 07/27/16  9:42 AM  Result Value Ref Range   WBC 6.7 4.0 - 10.5 K/uL   RBC 3.79 (L) 4.22 - 5.81 Mil/uL   Hemoglobin 11.0 (L) 13.0 - 17.0 g/dL   HCT 34.6 (L) 39.0 - 52.0 %   MCV 91.3 78.0 - 100.0 fl   MCHC 31.8 30.0 - 36.0 g/dL   RDW 16.4 (H) 11.5 - 15.5 %   Platelets 318.0 150.0 - 400.0 K/uL   Neutrophils Relative % 72.1 43.0 - 77.0 %   Lymphocytes Relative 12.1 12.0 - 46.0 %   Monocytes Relative 13.5 (H) 3.0 - 12.0 %   Eosinophils Relative 1.7 0.0 - 5.0 %   Basophils Relative 0.6 0.0 - 3.0 %   Neutro Abs 4.8 1.4 - 7.7 K/uL   Lymphs Abs 0.8 0.7 - 4.0 K/uL   Monocytes Absolute 0.9 0.1 - 1.0 K/uL   Eosinophils Absolute 0.1 0.0 - 0.7 K/uL   Basophils Absolute 0.0 0.0 - 0.1 K/uL  TSH     Status: None   Collection Time: 07/27/16  9:42 AM  Result Value Ref Range   TSH 1.04 0.35 - 4.50 uIU/mL   Assessment/Plan: 1. Peripheral edema Likely combination of chronic insufficiency and dietary intake but needs further assessment. Start Lasix 20 mg once daily. Start DASH diet. Will check BMP and BNP today. 2 day follow-up scheduled for repeat assessment. Will alter regimen according to lab results.  - furosemide (LASIX) 20 MG tablet; Take 1 tablet (20 mg total) by mouth daily.  Dispense: 30 tablet; Refill: 0 - Basic metabolic panel - B Nat Peptide   Leeanne Rio, PA-C

## 2016-08-07 NOTE — Patient Instructions (Signed)
Please go to the lab for blood work. We will call you with your results.   Please eat a diet low in salt and processed foods (frozen meals, canned foods, fast food). Keep legs elevated while resting.  Take Lasix as directed.  Follow-up with [place] this Thursday for Friday for reassessment.  If symptoms worsen, or you note any shortness of breath, please return immediately or go to the ER.

## 2016-08-07 NOTE — Telephone Encounter (Signed)
Patient called because he has had swelling in his feet for the past couple of days. He states that it's been so bad that he is unable to put on his socks and shoes. Patient was wondering if he should, "take [his] wife's fluid pills," to help out with the swelling or if he needs to come in and be seen. He states that he takes the Allopurinol for his gout, but this is more than gout. Please advise.

## 2016-08-07 NOTE — Telephone Encounter (Signed)
Advised patient not to take wife's medication. Advised that he needs to be seen  -  He does not want to wait until later this week, so he was scheduled this afternoon with PA Elyn Aquas.   Patient appreciative for the call and getting in this afternoon.

## 2016-08-07 NOTE — Progress Notes (Signed)
Pre visit review using our clinic review tool, if applicable. No additional management support is needed unless otherwise documented below in the visit note. 

## 2016-08-08 LAB — BASIC METABOLIC PANEL
BUN: 13 mg/dL (ref 7–25)
CALCIUM: 8.5 mg/dL — AB (ref 8.6–10.3)
CO2: 24 mmol/L (ref 20–32)
CREATININE: 1.06 mg/dL (ref 0.70–1.11)
Chloride: 106 mmol/L (ref 98–110)
Glucose, Bld: 94 mg/dL (ref 65–99)
POTASSIUM: 4.3 mmol/L (ref 3.5–5.3)
Sodium: 138 mmol/L (ref 135–146)

## 2016-08-08 LAB — BRAIN NATRIURETIC PEPTIDE: BRAIN NATRIURETIC PEPTIDE: 234.4 pg/mL — AB (ref <100)

## 2016-08-11 ENCOUNTER — Ambulatory Visit (INDEPENDENT_AMBULATORY_CARE_PROVIDER_SITE_OTHER): Payer: Medicare Other | Admitting: Family Medicine

## 2016-08-11 ENCOUNTER — Encounter: Payer: Self-pay | Admitting: Family Medicine

## 2016-08-11 ENCOUNTER — Ambulatory Visit: Payer: Medicare Other | Admitting: Family Medicine

## 2016-08-11 VITALS — BP 120/82 | HR 88 | Temp 98.0°F | Resp 16 | Ht 71.0 in | Wt 242.5 lb

## 2016-08-11 DIAGNOSIS — R600 Localized edema: Secondary | ICD-10-CM

## 2016-08-11 LAB — BASIC METABOLIC PANEL
BUN: 17 mg/dL (ref 6–23)
CHLORIDE: 102 meq/L (ref 96–112)
CO2: 30 mEq/L (ref 19–32)
CREATININE: 1.37 mg/dL (ref 0.40–1.50)
Calcium: 9.1 mg/dL (ref 8.4–10.5)
GFR: 52.69 mL/min — ABNORMAL LOW (ref >60.00)
GLUCOSE: 108 mg/dL — AB (ref 70–99)
POTASSIUM: 4.5 meq/L (ref 3.5–5.1)
Sodium: 137 mEq/L (ref 135–145)

## 2016-08-11 LAB — BRAIN NATRIURETIC PEPTIDE: PRO B NATRI PEPTIDE: 230 pg/mL — AB (ref 0.0–100.0)

## 2016-08-11 NOTE — Patient Instructions (Signed)
Follow up as scheduled or as needed We'll notify you of your lab results and make any changes if needed Continue to take 2 of the Lasix (40mg ) daily Drink plenty of fluids, limit your salt intake, and elevate your legs when sitting Call with any questions or concerns Enjoy the rest of your summer!!!

## 2016-08-11 NOTE — Progress Notes (Signed)
Pre visit review using our clinic review tool, if applicable. No additional management support is needed unless otherwise documented below in the visit note. 

## 2016-08-11 NOTE — Progress Notes (Signed)
   Subjective:    Patient ID: [name], male    DOB: 1931-10-27, 83 y.o.   MRN: [id number]  HPI Edema- pt was seen 8/6 w/ 2+ pitting edema to knees bilaterally and Lasix was doubled to 40mg  daily.  Pt has lost 10lbs of fluid since Monday.  Swelling has improved considerably but some swelling of lower legs and feet remain.  Denies SOB, CP.   Review of Systems For ROS see HPI     Objective:   Physical Exam  Constitutional: He is oriented to person, place, and time. He appears well-developed and well-nourished. No distress.  HENT:  Head: Normocephalic and atraumatic.  Eyes: Pupils are equal, round, and reactive to light. Conjunctivae and EOM are normal.  Neck: Normal range of motion. Neck supple. No thyromegaly present.  Cardiovascular: Normal rate, regular rhythm, normal heart sounds and intact distal pulses.   No murmur heard. Pulmonary/Chest: Effort normal and breath sounds normal. No respiratory distress.  Abdominal: Soft. Bowel sounds are normal. He exhibits no distension.  Musculoskeletal: He exhibits edema (1+ edema to mid shin bilaterally- much improved from Monday).  Lymphadenopathy:    He has no cervical adenopathy.  Neurological: He is alert and oriented to person, place, and time. No cranial nerve deficit.  Skin: Skin is warm and dry.  Psychiatric: He has a normal mood and affect. His behavior is normal.          Assessment & Plan:  LE edema- improving w/ increased dose of Lasix.  Pt has lost 10 lbs since Monday!  Check BMP and BNP given increased Lasix.  Reviewed supportive care and red flags that should prompt return.  Pt expressed understanding and is in agreement w/ plan.

## 2016-08-14 ENCOUNTER — Ambulatory Visit (INDEPENDENT_AMBULATORY_CARE_PROVIDER_SITE_OTHER): Payer: Medicare Other

## 2016-08-14 DIAGNOSIS — Z5181 Encounter for therapeutic drug level monitoring: Secondary | ICD-10-CM

## 2016-08-14 DIAGNOSIS — I4891 Unspecified atrial fibrillation: Secondary | ICD-10-CM

## 2016-08-14 LAB — POCT INR: INR: 2.9

## 2016-08-14 NOTE — Progress Notes (Signed)
I have reviewed and agree with note, evaluation, plan.   Stephen Hunter, MD  

## 2016-08-14 NOTE — Patient Instructions (Signed)
Pre visit review using our clinic review tool, if applicable. No additional management support is needed unless otherwise documented below in the visit note. 

## 2016-08-23 ENCOUNTER — Telehealth: Payer: Self-pay | Admitting: *Deleted

## 2016-08-23 DIAGNOSIS — R609 Edema, unspecified: Secondary | ICD-10-CM

## 2016-08-23 MED ORDER — FUROSEMIDE 20 MG PO TABS: 20.0000 mg | ORAL_TABLET | Freq: Every day | ORAL | 1 refills | Status: DC

## 2016-08-23 NOTE — Telephone Encounter (Signed)
Patient states that he is out of the Lasix pills and wants to know if he should continue taking them (if so, he needs additional refills). He states that he does still have some swelling, but not as bad as before.

## 2016-08-23 NOTE — Telephone Encounter (Signed)
I would continue the 20mg  daily (1 pill rather than 2).  Allyn for #90, 1 refill

## 2016-08-23 NOTE — Telephone Encounter (Signed)
Please advise 

## 2016-08-23 NOTE — Addendum Note (Signed)
Addended by: Davis Gourd on: 08/23/2016 03:47 PM   Modules accepted: Orders

## 2016-08-23 NOTE — Telephone Encounter (Signed)
Made aware and rx filled to pharmacy.

## 2016-08-25 ENCOUNTER — Other Ambulatory Visit: Payer: Self-pay | Admitting: Family Medicine

## 2016-08-29 ENCOUNTER — Encounter: Payer: Self-pay | Admitting: Gastroenterology

## 2016-09-11 ENCOUNTER — Ambulatory Visit: Payer: Medicare Other

## 2016-09-19 ENCOUNTER — Encounter: Payer: Self-pay | Admitting: Family Medicine

## 2016-09-19 ENCOUNTER — Ambulatory Visit (INDEPENDENT_AMBULATORY_CARE_PROVIDER_SITE_OTHER): Payer: Medicare Other | Admitting: Family Medicine

## 2016-09-19 VITALS — BP 121/83 | HR 62 | Temp 98.1°F | Resp 16 | Ht 71.0 in | Wt 239.1 lb

## 2016-09-19 DIAGNOSIS — J340 Abscess, furuncle and carbuncle of nose: Secondary | ICD-10-CM

## 2016-09-19 MED ORDER — MUPIROCIN 2 % EX OINT: 1.0000 "application " | TOPICAL_OINTMENT | Freq: Two times a day (BID) | CUTANEOUS | 0 refills | Status: DC

## 2016-09-19 MED ORDER — DOXYCYCLINE HYCLATE 100 MG PO TABS: 100.0000 mg | ORAL_TABLET | Freq: Two times a day (BID) | ORAL | 0 refills | Status: DC

## 2016-09-19 NOTE — Progress Notes (Signed)
   Subjective:    Patient ID: [name], male    DOB: 03/21/1931, 83 y.o.   MRN: [id number]  HPI Cellulitis- pt reports he woke 5 days ago w/ swollen L nostril and 'just as sore as it can be'.  Has been using polysporin, hot water, and ice for pain relief.  Pt reports 'it feels like there's something in there that needs to burst.    Review of Systems For ROS see HPI     Objective:   Physical Exam  Constitutional: He appears well-developed and well-nourished. No distress.  HENT:  Head: Normocephalic and atraumatic.  L nostril swelling and redness externally w/ small white head on lateral wall of L nostril but no pus pocket or fluctuance- very TTP  Vitals reviewed.         Assessment & Plan:  Nasal cellulitis- new.  Reviewed dx and tx w/ pt.  Start Doxy and Mupirocin.  If no improvement or worsening, will need ENT evaluation.  No area to I&D today but cautioned him that this could change.  Reviewed supportive care and red flags that should prompt return.  Pt expressed understanding and is in agreement w/ plan.

## 2016-09-19 NOTE — Progress Notes (Signed)
Pre visit review using our clinic review tool, if applicable. No additional management support is needed unless otherwise documented below in the visit note. 

## 2016-09-19 NOTE — Patient Instructions (Signed)
Follow up by phone on Friday morning to let me know how things are going- if they are worsening we will send you to ENT Start the Doxycyline twice daily- take w/ food Use the mupirocin ointment twice daily in the nose Continue to alternate ice/heat for pain relief Call with any questions or concerns Hang in there!!!

## 2016-09-22 ENCOUNTER — Telehealth: Payer: Self-pay | Admitting: Family Medicine

## 2016-09-22 NOTE — Telephone Encounter (Signed)
FYI, pt called to states that his nose is looking better from 9/18 appt. I advise pt that if he has any changes with it getting worse over the weekend to call us Monday and we could do the referral to ENT for him. Pt stated an understanding.

## 2016-09-25 ENCOUNTER — Ambulatory Visit (INDEPENDENT_AMBULATORY_CARE_PROVIDER_SITE_OTHER): Payer: Medicare Other | Admitting: General Practice

## 2016-09-25 DIAGNOSIS — Z5181 Encounter for therapeutic drug level monitoring: Secondary | ICD-10-CM | POA: Diagnosis not present

## 2016-09-25 DIAGNOSIS — I4891 Unspecified atrial fibrillation: Secondary | ICD-10-CM

## 2016-09-25 DIAGNOSIS — Z7901 Long term (current) use of anticoagulants: Secondary | ICD-10-CM

## 2016-09-25 LAB — POCT INR: INR: 2.6

## 2016-09-25 NOTE — Patient Instructions (Signed)
Pre visit review using our clinic review tool, if applicable. No additional management support is needed unless otherwise documented below in the visit note. 

## 2016-09-25 NOTE — Progress Notes (Signed)
I Agree with this plan

## 2016-10-05 ENCOUNTER — Other Ambulatory Visit: Payer: Self-pay | Admitting: Family Medicine

## 2016-10-16 ENCOUNTER — Other Ambulatory Visit: Payer: Self-pay | Admitting: Family Medicine

## 2016-10-22 ENCOUNTER — Other Ambulatory Visit: Payer: Self-pay | Admitting: Family Medicine

## 2016-10-23 ENCOUNTER — Ambulatory Visit (INDEPENDENT_AMBULATORY_CARE_PROVIDER_SITE_OTHER): Payer: Medicare Other | Admitting: General Practice

## 2016-10-23 DIAGNOSIS — I4891 Unspecified atrial fibrillation: Secondary | ICD-10-CM | POA: Diagnosis not present

## 2016-10-23 DIAGNOSIS — Z7901 Long term (current) use of anticoagulants: Secondary | ICD-10-CM

## 2016-10-23 LAB — POCT INR: INR: 3.6

## 2016-10-23 NOTE — Patient Instructions (Signed)
Pre visit review using our clinic review tool, if applicable. No additional management support is needed unless otherwise documented below in the visit note. 

## 2016-11-20 ENCOUNTER — Ambulatory Visit: Payer: Medicare Other

## 2016-12-13 ENCOUNTER — Ambulatory Visit (INDEPENDENT_AMBULATORY_CARE_PROVIDER_SITE_OTHER): Payer: Medicare Other | Admitting: General Practice

## 2016-12-13 DIAGNOSIS — Z7901 Long term (current) use of anticoagulants: Secondary | ICD-10-CM | POA: Diagnosis not present

## 2016-12-13 DIAGNOSIS — I4891 Unspecified atrial fibrillation: Secondary | ICD-10-CM

## 2016-12-13 LAB — POCT INR: INR: 3.4

## 2016-12-13 NOTE — Progress Notes (Signed)
I agree with this plan.

## 2016-12-13 NOTE — Patient Instructions (Addendum)
Pre visit review using our clinic review tool, if applicable. No additional management support is needed unless otherwise documented below in the visit note.  Skip coumadin today (12/12) and then change dosage and take 1 tablet daily except 1/2 tablet on Monday and Fridays.  Re-check in 3 to 4 weeks.

## 2016-12-21 ENCOUNTER — Encounter: Payer: Medicare Other | Admitting: Family Medicine

## 2016-12-28 ENCOUNTER — Ambulatory Visit: Payer: Medicare Other

## 2016-12-28 ENCOUNTER — Ambulatory Visit (INDEPENDENT_AMBULATORY_CARE_PROVIDER_SITE_OTHER): Payer: Medicare Other | Admitting: Family Medicine

## 2016-12-28 ENCOUNTER — Encounter: Payer: Self-pay | Admitting: Family Medicine

## 2016-12-28 VITALS — BP 130/84 | HR 76 | Temp 98.6°F | Ht 70.0 in | Wt 238.4 lb

## 2016-12-28 DIAGNOSIS — Z0000 Encounter for general adult medical examination without abnormal findings: Secondary | ICD-10-CM

## 2016-12-28 DIAGNOSIS — E782 Mixed hyperlipidemia: Secondary | ICD-10-CM

## 2016-12-28 DIAGNOSIS — E119 Type 2 diabetes mellitus without complications: Secondary | ICD-10-CM

## 2016-12-28 DIAGNOSIS — I10 Essential (primary) hypertension: Secondary | ICD-10-CM | POA: Diagnosis not present

## 2016-12-28 DIAGNOSIS — R600 Localized edema: Secondary | ICD-10-CM

## 2016-12-28 DIAGNOSIS — I872 Venous insufficiency (chronic) (peripheral): Secondary | ICD-10-CM

## 2016-12-28 LAB — COMPREHENSIVE METABOLIC PANEL
ALT: 7 U/L (ref 0–53)
AST: 13 U/L (ref 0–37)
Albumin: 3.5 g/dL (ref 3.5–5.2)
Alkaline Phosphatase: 111 U/L (ref 39–117)
BUN: 19 mg/dL (ref 6–23)
CHLORIDE: 105 meq/L (ref 96–112)
CO2: 29 meq/L (ref 19–32)
Calcium: 8.6 mg/dL (ref 8.4–10.5)
Creatinine, Ser: 1.23 mg/dL (ref 0.40–1.50)
GFR: 59.62 mL/min — AB (ref >60.00)
Glucose, Bld: 87 mg/dL (ref 70–99)
POTASSIUM: 3.8 meq/L (ref 3.5–5.1)
Sodium: 139 mEq/L (ref 135–145)
Total Bilirubin: 0.7 mg/dL (ref 0.2–1.2)
Total Protein: 6.5 g/dL (ref 6.0–8.3)

## 2016-12-28 LAB — HEMOGLOBIN A1C: HEMOGLOBIN A1C: 6.1 % (ref 4.6–6.5)

## 2016-12-28 NOTE — Patient Instructions (Signed)
Please return for your schedule Annual Wellness visit.  Please schedule a follow up visit with Dr. Birdie Riddle in 6 months.   No changes were made today in your medications.   Decrease salt in your diet; return if you feel lightheaded.

## 2016-12-28 NOTE — Progress Notes (Signed)
Subjective  Chief Complaint  Patient presents with  . Annual Exam    Not Fasting    HPI: [name] is a 83 y.o. male who presents to [place] at [facility] today for a Male Wellness Visit. He also needs f/u for his chronic medical problems and leg swelling.  These will be addressed in addition to the Health Maintenance Visit.   Wellness Visit: annual visit with health maintenance review and exam    Needed physical before end of year. PCP is Dr. Birdie Riddle. He is a new pt to me. Gets along well. Has no complaints. Will have AWV soon. HM is up to date. I reviewed his most recent labwork.   Lifestyle: Body mass index is 34.2 kg/m. Wt Readings from Last 3 Encounters:  12/28/16 238 lb 6.1 oz (108.1 kg)  09/19/16 239 lb 2 oz (108.5 kg)  08/11/16 242 lb 8 oz (110 kg)   BP Readings from Last 3 Encounters:  12/28/16 130/84  09/19/16 121/83  08/11/16 120/82    Diet: low sodium and but admits high salt christmas/holiday meals recently Exercise: rarely, walking  Chronic disease management visit and/or acute problem visit:  DM - has been well controlled. Denies neuropathy sxs.   LE edema - recently was treated with increased dose of diuretic. Reports worked well with stable labs. Now with swelling again. No sob or orthopnea. No cp. High salt diet recently.   HTN - stable on meds; initial bp here low (100/56). orhtostatics are mildly positive from lying to sitting, but compensates with standing. Denies sxs of lightheadedness or palpitations. Takes bp meds. Typically it has been well controlled.   Lipids have been well controlled on meds. No AEs.   afib on anticoagulants. No bleeding. No palpitations.   Patient Active Problem List   Diagnosis Date Noted  . Long term (current) use of anticoagulants 09/25/2016  . Rectal bleeding 01/21/2016  . Warfarin-induced coagulopathy ([place])   . Chronic constipation 04/29/2015  . Soft tissue mass 04/29/2015  . Chronic  venous insufficiency   . Encounter for therapeutic drug monitoring 05/28/2014  . Essential hypertension 03/07/2014  . Preventative health care 03/07/2014  . Varicose veins of lower extremities with other complications 85/02/7739  . Hematoma of leg 09/09/2013  . Diabetes mellitus without complication ([place]) 28/78/6767  . Iron deficiency anemia due to chronic blood loss 08/26/2013  . Hyperlipidemia 08/13/2006  . GOUT 08/13/2006  . ATRIAL FIBRILLATION 08/13/2006   Health Maintenance  Topic Date Due  . HEMOGLOBIN A1C  01/27/2017  . OPHTHALMOLOGY EXAM  03/22/2017  . FOOT EXAM  09/19/2017  . TETANUS/TDAP  02/27/2024  . PNA vac Low Risk Adult  Completed   Immunization History  Administered Date(s) Administered  . Pneumococcal Conjugate-13 02/26/2014  . Pneumococcal Polysaccharide-23 01/02/2006, 12/20/2015  . Td 01/03/2000  . Tdap 02/26/2014   We updated and reviewed the patient's past history in detail and it is documented below. Allergies: Patient is allergic to influenza vac split [flu virus vaccine]. Past Medical History  has a past medical history of Anemia, Atrial fibrillation ([place]), Blood transfusion without reported diagnosis, Chronic constipation, Chronic renal insufficiency, stage II (mild), Chronic venous insufficiency, Diabetes mellitus without complication ([place]), Gout, Hyperlipidemia, Hypertension, Stercoral ulcer of rectum, Varicose veins of both lower extremities with complications, and Vitamin B12 deficiency. Past Surgical History Patient  has a past surgical history that includes Tonsillectomy and adenoidectomy (1945); Hemorrhoid surgery (1970); Bunionectomy (2006); and Colonoscopy (N/A, 01/22/2016). Social History Patient  reports that he has quit smoking. His smoking use included cigarettes. he has never used smokeless tobacco. He reports that he does not drink alcohol or use drugs. Family History family history includes Alcohol abuse in his father; Diabetes in his  father; Obesity in his brother. Review of Systems: Constitutional: negative for fever or malaise Ophthalmic: negative for photophobia, double vision or loss of vision Cardiovascular: negative for chest pain, dyspnea on exertion, or new LE swelling Respiratory: negative for SOB or persistent cough Gastrointestinal: negative for abdominal pain, change in bowel habits or melena Genitourinary: negative for dysuria or gross hematuria Musculoskeletal: negative for new gait disturbance or muscular weakness Integumentary: negative for new or persistent rashes Neurological: negative for TIA or stroke symptoms Psychiatric: negative for SI or delusions Allergic/Immunologic: negative for hives  Patient Care Team    Relationship Specialty Notifications Start End  Midge Minium, MD PCP - General Family Medicine  03/31/15   Elza Rafter, MD Consulting Physician Family Medicine  10/14/13    Comment: Vein MD   Objective  Vitals: BP 130/84   Pulse 76   Temp 98.6 F (37 C) (Oral)   Ht 5\' 10"  (1.778 m)   Wt 238 lb 6.1 oz (108.1 kg)   SpO2 98%   BMI 34.20 kg/m  orthostatics: 130/84-110/66-128/78, HR 69-71 (BB) General:  Well developed, well nourished, no acute distress  Psych:  Alert and orientedx3,normal mood and affect HEENT:  Normocephalic, atraumatic, non-icteric sclera, PERRL, oropharynx is clear without mass or exudate, supple neck without adenopathy, mass or thyromegaly Cardiovascular:  Irregularly irregular without gallop, rub 2/6 systolic murmur, nondisplaced PMI, +2 distal pulses in bilateral upper and lower extremities. + 2 pitting edema to mid calf Respiratory:  Good breath sounds bilaterally, CTAB with normal respiratory effort Gastrointestinal: normal bowel sounds, soft, non-tender, no noted masses. No HSM MSK: no deformities, contusions. Joints are without erythema or swelling. Spine and CVA region are nontender Skin:  Warm, no rashes or suspicious lesions noted Neurologic:     Mental status is normal. CN 2-11 are normal. Gross motor and sensory exams are normal. Stable gait. No tremor GU: No inguinal hernias or adenopathy are appreciated bilaterally   Lab Results  Component Value Date   HGBA1C 5.9 07/27/2016   Lab Results  Component Value Date   CHOL 98 07/27/2016   CHOL 118 12/20/2015   CHOL 123 06/23/2015   Lab Results  Component Value Date   HDL 39.20 07/27/2016   HDL 38.40 (L) 12/20/2015   HDL 37.10 (L) 06/23/2015   Lab Results  Component Value Date   LDLCALC 49 07/27/2016   LDLCALC 65 12/20/2015   LDLCALC 60 06/23/2015   Lab Results  Component Value Date   TRIG 46.0 07/27/2016   TRIG 72.0 12/20/2015   TRIG 127.0 06/23/2015   Lab Results  Component Value Date   CHOLHDL 2 07/27/2016   CHOLHDL 3 12/20/2015   CHOLHDL 3 06/23/2015   No results found for: LDLDIRECT  Assessment  1. Annual physical exam   2. Chronic venous insufficiency   3. Essential hypertension   4. Mixed hyperlipidemia   5. Diabetes mellitus without complication ([place])   6. Lower extremity edema      Plan  Male Wellness Visit:  Age appropriate Health Maintenance and Prevention measures were discussed with patient. Included topics are cancer screening recommendations, ways to keep healthy (see AVS) including dietary and exercise recommendations, regular eye and dental care, use of seat belts, and avoidance of moderate  alcohol use and tobacco use.   BMI: discussed patient's BMI and encouraged positive lifestyle modifications to help get to or maintain a target BMI.  HM needs and immunizations were addressed and ordered. See below for orders. See HM and immunization section for updates.  Routine labs and screening tests ordered including cmp, cbc and lipids where appropriate.  Discussed recommendations regarding Vit D and calcium supplementation (see AVS)  Chronic disease f/u and/or acute problem visit: (deemed necessary to be done in addition to the wellness  visit):  Recheck labs for Dm, HTN and thyroid.   Orthostatic hypotension - stable bp upon recheck. Cautious to prevent falls. No med changes to day. Weight is stable. Check labs. F/u if feels lightheaded. Continue 20 lasix daily.   Lipids are controlled. Continue meds.  Edema - start low sodium diet. F/u if persists.   Follow up: for AWV,  6 months with Dr. Birdie Riddle   Commons side effects, risks, benefits, and alternatives for medications and treatment plan prescribed today were discussed, and the patient expressed understanding of the given instructions. Patient is instructed to call or message via MyChart if he/she has any questions or concerns regarding our treatment plan. No barriers to understanding were identified. We discussed Red Flag symptoms and signs in detail. Patient expressed understanding regarding what to do in case of urgent or emergency type symptoms.   Medication list was reconciled, printed and provided to the patient in AVS. Patient instructions and summary information was reviewed with the patient as documented in the AVS. This note was prepared with assistance of Dragon voice recognition software. Occasional wrong-word or sound-a-like substitutions may have occurred due to the inherent limitations of voice recognition software  Orders Placed This Encounter  Procedures  . Hemoglobin A1c  . TSH  . Comprehensive metabolic panel   No orders of the defined types were placed in this encounter.

## 2016-12-29 LAB — TSH: TSH: 1.22 u[IU]/mL (ref 0.35–4.50)

## 2017-01-01 NOTE — Progress Notes (Signed)
Please call patient: I have reviewed his/her lab results. Lab results are all stable. No change in medications. Hopefully, leg swelling is improving with low sodium diet. F/u as directed. Monitor for symptoms of lightheadedness.

## 2017-01-08 ENCOUNTER — Ambulatory Visit (INDEPENDENT_AMBULATORY_CARE_PROVIDER_SITE_OTHER): Payer: Medicare Other | Admitting: General Practice

## 2017-01-08 DIAGNOSIS — Z7901 Long term (current) use of anticoagulants: Secondary | ICD-10-CM | POA: Diagnosis not present

## 2017-01-08 DIAGNOSIS — I4891 Unspecified atrial fibrillation: Secondary | ICD-10-CM

## 2017-01-08 LAB — POCT INR: INR: 1.8

## 2017-01-08 NOTE — Patient Instructions (Addendum)
Pre visit review using our clinic review tool, if applicable. No additional management support is needed unless otherwise documented below in the visit note.  Take 1 tablet today (1/7) and then continue to take 1 tablet daily except 1/2 tablet on Monday and Fridays.  Re-check in 3 to 4 weeks.

## 2017-01-11 ENCOUNTER — Other Ambulatory Visit: Payer: Self-pay | Admitting: Family Medicine

## 2017-01-22 ENCOUNTER — Ambulatory Visit: Payer: Medicare Other

## 2017-01-24 ENCOUNTER — Ambulatory Visit: Payer: Medicare Other

## 2017-01-25 ENCOUNTER — Other Ambulatory Visit: Payer: Self-pay | Admitting: Family Medicine

## 2017-01-29 ENCOUNTER — Ambulatory Visit (INDEPENDENT_AMBULATORY_CARE_PROVIDER_SITE_OTHER): Payer: Medicare Other | Admitting: General Practice

## 2017-01-29 DIAGNOSIS — I4891 Unspecified atrial fibrillation: Secondary | ICD-10-CM | POA: Diagnosis not present

## 2017-01-29 DIAGNOSIS — Z7901 Long term (current) use of anticoagulants: Secondary | ICD-10-CM | POA: Diagnosis not present

## 2017-01-29 LAB — POCT INR: INR: 2.2

## 2017-01-29 NOTE — Patient Instructions (Addendum)
Pre visit review using our clinic review tool, if applicable. No additional management support is needed unless otherwise documented below in the visit note.  Take 1 tablet today (1/7) and then continue to take 1 tablet daily except 1/2 tablet on Monday and Fridays.  Re-check in 6 weeks.

## 2017-02-05 ENCOUNTER — Ambulatory Visit: Payer: Medicare Other

## 2017-02-20 LAB — BASIC METABOLIC PANEL
BUN: 20 (ref 4–21)
CREATININE: 1.6 — AB (ref 0.6–1.3)
Glucose: 94
POTASSIUM: 4.1 (ref 3.4–5.3)
SODIUM: 139 (ref 137–147)

## 2017-02-20 LAB — CBC AND DIFFERENTIAL
HEMATOCRIT: 39 — AB (ref 41–53)
HEMOGLOBIN: 11.6 — AB (ref 13.5–17.5)
Neutrophils Absolute: 6
Platelets: 336 (ref 150–399)
WBC: 8.2

## 2017-02-20 LAB — LIPID PANEL
CHOLESTEROL: 112 (ref 0–200)
HDL: 39 (ref 35–70)
LDL Cholesterol: 52
TRIGLYCERIDES: 104 (ref 40–160)

## 2017-02-20 LAB — TSH: TSH: 1.18 (ref 0.41–5.90)

## 2017-02-20 LAB — HEMOGLOBIN A1C: Hemoglobin A1C: 6.6

## 2017-02-20 LAB — HEPATIC FUNCTION PANEL
ALT: 14 (ref 10–40)
AST: 14 (ref 14–40)
Bilirubin, Total: 5

## 2017-03-01 ENCOUNTER — Other Ambulatory Visit: Payer: Self-pay | Admitting: Family Medicine

## 2017-03-05 ENCOUNTER — Ambulatory Visit (INDEPENDENT_AMBULATORY_CARE_PROVIDER_SITE_OTHER): Payer: Medicare Other | Admitting: General Practice

## 2017-03-05 DIAGNOSIS — I4891 Unspecified atrial fibrillation: Secondary | ICD-10-CM

## 2017-03-05 DIAGNOSIS — Z7901 Long term (current) use of anticoagulants: Secondary | ICD-10-CM | POA: Diagnosis not present

## 2017-03-05 LAB — POCT INR: INR: 2.5

## 2017-03-05 NOTE — Patient Instructions (Addendum)
Pre visit review using our clinic review tool, if applicable. No additional management support is needed unless otherwise documented below in the visit note.  Continue to take 1 tablet daily except 1/2 tablet on Monday and Fridays.  Re-check in 6 weeks.    

## 2017-03-06 ENCOUNTER — Other Ambulatory Visit: Payer: Self-pay | Admitting: Family Medicine

## 2017-03-06 DIAGNOSIS — R609 Edema, unspecified: Secondary | ICD-10-CM

## 2017-03-12 ENCOUNTER — Ambulatory Visit: Payer: Medicare Other

## 2017-03-31 ENCOUNTER — Other Ambulatory Visit: Payer: Self-pay | Admitting: Family Medicine

## 2017-04-04 ENCOUNTER — Other Ambulatory Visit: Payer: Self-pay | Admitting: Family Medicine

## 2017-04-13 ENCOUNTER — Telehealth: Payer: Self-pay | Admitting: Gastroenterology

## 2017-04-13 NOTE — Telephone Encounter (Signed)
Patient with a history of large rectal stercoral ulceration. CT scan sent from the [place] showing "thickening" of the anal canal / rectum. I suspect related to known stercoral ulcer but would be good to reassess him in the clinic.   Can you please contact this patient and schedule a office follow up visit? thanks

## 2017-04-16 ENCOUNTER — Ambulatory Visit (INDEPENDENT_AMBULATORY_CARE_PROVIDER_SITE_OTHER): Payer: Medicare Other | Admitting: General Practice

## 2017-04-16 DIAGNOSIS — I4891 Unspecified atrial fibrillation: Secondary | ICD-10-CM

## 2017-04-16 DIAGNOSIS — Z7901 Long term (current) use of anticoagulants: Secondary | ICD-10-CM

## 2017-04-16 LAB — POCT INR: INR: 1.9

## 2017-04-16 NOTE — Telephone Encounter (Signed)
Left detailed message for patient to call our office to schedule an office visit with Dr. Havery Moros. This visit is to discuss records he had a chance to review that were sent from [place].

## 2017-04-16 NOTE — Patient Instructions (Addendum)
Pre visit review using our clinic review tool, if applicable. No additional management support is needed unless otherwise documented below in the visit note.  Take 1 tablet today (4/15) and then continue to take 1 tablet daily except 1/2 tablet on Monday and Fridays.  Re-check in 6 weeks.

## 2017-04-16 NOTE — Telephone Encounter (Signed)
Appointment scheduled for 04/25/17

## 2017-04-21 ENCOUNTER — Other Ambulatory Visit: Payer: Self-pay | Admitting: Family Medicine

## 2017-04-25 ENCOUNTER — Ambulatory Visit: Payer: Medicare Other | Admitting: Gastroenterology

## 2017-04-25 ENCOUNTER — Encounter: Payer: Self-pay | Admitting: Gastroenterology

## 2017-04-25 VITALS — BP 124/58 | HR 75 | Ht 70.5 in | Wt 238.1 lb

## 2017-04-25 DIAGNOSIS — K5900 Constipation, unspecified: Secondary | ICD-10-CM | POA: Diagnosis not present

## 2017-04-25 DIAGNOSIS — R932 Abnormal findings on diagnostic imaging of liver and biliary tract: Secondary | ICD-10-CM | POA: Diagnosis not present

## 2017-04-25 DIAGNOSIS — R935 Abnormal findings on diagnostic imaging of other abdominal regions, including retroperitoneum: Secondary | ICD-10-CM

## 2017-04-25 DIAGNOSIS — K626 Ulcer of anus and rectum: Secondary | ICD-10-CM

## 2017-04-25 NOTE — Patient Instructions (Addendum)
If you are age 82 or older, your body mass index should be between 23-30. Your Body mass index is 33.68 kg/m. If this is out of the aforementioned range listed, please consider follow up with your Primary Care Provider.  If you are age 51 or younger, your body mass index should be between 19-25. Your Body mass index is 33.68 kg/m. If this is out of the aformentioned range listed, please consider follow up with your Primary Care Provider.   You have been scheduled for a flexible sigmoidoscopy. Please follow the written instructions given to you at your visit today. If you use inhalers (even only as needed), please bring them with you on the day of your procedure.  You may stay on your coumadin for this procedure.   Thank you for entrusting me with your care and for choosing [facility], Dr. [place] Cellar

## 2017-04-25 NOTE — Progress Notes (Signed)
HPI :  84 year old male here for a follow-up visit. He has a history of atrial fibrillation on Coumadin who initially presented for lower GI bleed in January 2018.  CT angio initially showed diverticulosis but no active bleeding. It also showed ? cirrhosis but platelets and spleen were normal. He had FFP to reverse coumadin after his bleeding persisted and he underwent a colonoscopy, which showed a few small polyps but large solitary rectal ulcer with visible vessel which was cauterized.   He also had an [place] with elastography on 01/28/16 which showed normal appearing liver and spleen, but elastography with F3-F4 fibrosis which raised the question of cirrhosis. Platelets normal. LFTs normal. Iron sat normal. He denies any alcohol use since age 11  Since his last visit with [place] he had a flexible sigmoidoscopy in April 2018 which showed a persistent large stercoral ulceration with negative biopsies. This looks slightly improved since previous. He declined pelvic floor PT and used some MiraLAX. He states this MiraLAX did not work too well for him and he has been using Dulcolax as needed for his stools. He usually uses once of these days and he has wanted to about once a day. He states he often needs to strain and often has to use lubricate his fingers and open up his anal canal to rule out have a bowel movement. He denies any abdominal pains. He denies any blood in his stools.  He recently had a CT scan done at the [place] showing "thickening" of the anal canal / rectum  Prior workup: Colonoscopy 01/22/16 - 4mm cecal polyp, 61mm ascending colon - both adenomas left sided diverticulosis, large solitary rectal ulcer with visible vessel which was cauterized. Suspected stercoral ulcer from constipation / dyssynergia. Flex sig 04/11/2016 - large stercoral ulceration -   Past Medical History:  Diagnosis Date  . Anemia   . Atrial fibrillation ([place])   . Blood transfusion without reported diagnosis   . Chronic  constipation    colace helps  . Chronic renal insufficiency, stage II (mild)    CrCl @60 .  . Chronic venous insufficiency   . Diabetes mellitus without complication (HCC)    hx of good control  . Gout   . Hyperlipidemia   . Hypertension   . Stercoral ulcer of rectum   . Varicose veins of both lower extremities with complications   . Vitamin B12 deficiency      Past Surgical History:  Procedure Laterality Date  . BUNIONECTOMY  2006  . COLONOSCOPY N/A 01/22/2016   Procedure: COLONOSCOPY;  Surgeon: Manus Gunning, MD;  Location: [facility];  Service: Gastroenterology;  Laterality: N/A;  . [place]  . TONSILLECTOMY AND ADENOIDECTOMY  1945   Family History  Problem Relation Age of Onset  . Alcohol abuse Father   . Diabetes Father   . Obesity Brother    Social History   Tobacco Use  . Smoking status: Former Smoker    Types: Cigarettes  . Smokeless tobacco: Never Used  Substance Use Topics  . Alcohol use: No  . Drug use: No   Current Outpatient Medications  Medication Sig Dispense Refill  . allopurinol (ZYLOPRIM) 300 MG tablet TAKE 1 TABLET (300 MG TOTAL) BY MOUTH DAILY. 90 tablet 1  . bisacodyl (DULCOLAX) 5 MG EC tablet Take 2 tablets (10 mg total) by mouth daily as needed for moderate constipation. (Patient taking differently: Take 5 mg by mouth daily as needed for moderate constipation. Pt takes 1 tablet  daily as needed) 30 tablet 0  . cyanocobalamin (,VITAMIN B-12,) 1000 MCG/ML injection Inject 1 mL (1,000 mcg total) into the muscle every 30 (thirty) days. 1 mL 1  . ferrous sulfate 325 (65 FE) MG tablet Take 325 mg by mouth daily.     . furosemide (LASIX) 20 MG tablet TAKE 1 TABLET BY MOUTH EVERY DAY 90 tablet 1  . lisinopril (PRINIVIL,ZESTRIL) 2.5 MG tablet TAKE 1 TABLET BY MOUTH EVERY DAY 90 tablet 0  . metFORMIN (GLUCOPHAGE) 500 MG tablet TAKE 1 TABLET DAILY 90 tablet 1  . metoprolol succinate (TOPROL-XL) 50 MG 24 hr tablet TAKE 1 TABLET (50  MG TOTAL) BY MOUTH DAILY. 90 tablet 1  . simvastatin (ZOCOR) 20 MG tablet TAKE 1 TABLET (20 MG TOTAL) BY MOUTH DAILY. 90 tablet 1  . warfarin (COUMADIN) 5 MG tablet TAKE AS DIRECTED BY ANTICOAGULATION CLINIC 120 tablet 1   No current facility-administered medications for this visit.    Allergies  Allergen Reactions  . Influenza Vac Split [Flu Virus Vaccine] Nausea And Vomiting     Review of Systems: All systems reviewed and negative except where noted in HPI.   Lab Results  Component Value Date   WBC 6.7 07/27/2016   HGB 11.0 (L) 07/27/2016   HCT 34.6 (L) 07/27/2016   MCV 91.3 07/27/2016   PLT 318.0 07/27/2016    Lab Results  Component Value Date   CREATININE 1.23 12/28/2016   BUN 19 12/28/2016   NA 139 12/28/2016   K 3.8 12/28/2016   CL 105 12/28/2016   CO2 29 12/28/2016    Lab Results  Component Value Date   ALT 7 12/28/2016   AST 13 12/28/2016   ALKPHOS 111 12/28/2016   BILITOT 0.7 12/28/2016   Lab Results  Component Value Date   INR 1.9 04/16/2017   INR 2.5 03/05/2017   INR 2.2 01/29/2017     Physical Exam: BP (!) 124/58   Pulse 75   Ht 5' 10.5" (1.791 m)   Wt 238 lb 2 oz (108 kg)   BMI 33.68 kg/m  Constitutional: Pleasant,well-developed, male in no acute distress. HEENT: Normocephalic and atraumatic. Conjunctivae are normal. No scleral icterus. Neck supple.  Cardiovascular: Normal rate, regular rhythm.  Pulmonary/chest: Effort normal and breath sounds normal. No wheezing, rales or rhonchi. Abdominal: Soft, nondistended, nontender.  There are no masses palpable. No hepatomegaly. Extremities: no edema Lymphadenopathy: No cervical adenopathy noted. Neurological: Alert and oriented to person place and time. Skin: Skin is warm and dry. No rashes noted. Psychiatric: Normal mood and affect. Behavior is normal.   ASSESSMENT AND PLAN: 84 year old male here for reassessment of the following issues:  Stercoral ulcer / abnormal CT of the abdomen /  constipation - I suspect the stercoral ulceration persists in the setting of suspected pelvic floor dyssynergia causing outlet constipation and associated CT changes. That being said its been a year since his last evaluation, I offered him a flexible sigmoidoscopy without sedation while on Coumadin to simply look at the area ensure no interval worsening. Assuming this is persistent, I strongly recommend consultation for pelvic floor PT to see if this can improve his symptoms as well as the ulceration. I discussed risks / benefits of flex sig. He was agreeable to this.   Possible cirrhosis - will obtain report of CT scan to clarify what his liver looks like on recent imaging. His platelets are normal as is his spleen. If he has cirrhosis he would be considered to be  well-compensated. Will await report of CT scan to clarify.  He agreed with the plan.   Winder Cellar, MD [facility] Gastroenterology

## 2017-05-07 ENCOUNTER — Encounter: Payer: Self-pay | Admitting: Gastroenterology

## 2017-05-07 ENCOUNTER — Other Ambulatory Visit: Payer: Self-pay

## 2017-05-07 ENCOUNTER — Ambulatory Visit (AMBULATORY_SURGERY_CENTER): Payer: Medicare Other | Admitting: Gastroenterology

## 2017-05-07 VITALS — BP 124/49 | HR 72 | Temp 98.0°F | Resp 11 | Ht 70.0 in | Wt 238.0 lb

## 2017-05-07 DIAGNOSIS — K5190 Ulcerative colitis, unspecified, without complications: Secondary | ICD-10-CM | POA: Diagnosis not present

## 2017-05-07 DIAGNOSIS — K626 Ulcer of anus and rectum: Secondary | ICD-10-CM | POA: Diagnosis present

## 2017-05-07 DIAGNOSIS — R935 Abnormal findings on diagnostic imaging of other abdominal regions, including retroperitoneum: Secondary | ICD-10-CM | POA: Diagnosis not present

## 2017-05-07 MED ORDER — SODIUM CHLORIDE 0.9 % IV SOLN: 500.0000 mL | Freq: Once | INTRAVENOUS | Status: DC

## 2017-05-07 NOTE — Progress Notes (Signed)
To PACU, VSS. Report to RN.tb 

## 2017-05-07 NOTE — Progress Notes (Signed)
Pt. Reports no change in his medical or surgical history since his pre-visit 04/25/2017.

## 2017-05-07 NOTE — Op Note (Signed)
[place] Patient Name: [name] Procedure Date: 05/07/2017 11:37 AM MRN: [id number] Endoscopist: Remo Lipps P. Caidyn Henricksen MD, MD Age: 84 Referring MD:  Date of Birth: 1931/10/12 Gender: Male Account #: [id number] Procedure:                Flexible Sigmoidoscopy Indications:              Abnormal CT of the GI tract with thickening of                            rectum / anal canal, history of suspected stercoral                            ulcer of the rectum Medicines:                None Procedure:                Pre-Anesthesia Assessment:                           - Prior to the procedure, a History and Physical                            was performed, and patient medications and                            allergies were reviewed. The patient's tolerance of                            previous anesthesia was also reviewed. The risks                            and benefits of the procedure and the sedation                            options and risks were discussed with the patient.                            All questions were answered, and informed consent                            was obtained. Prior Anticoagulants: The patient has                            taken Coumadin (warfarin), last dose was 1 day                            prior to procedure. ASA Grade Assessment: III - A                            patient with severe systemic disease. After                            reviewing the risks and benefits, the patient was  deemed in satisfactory condition to undergo the                            procedure.                           After obtaining informed consent, the scope was                            passed under direct vision. The Model PCF-H190DL                            [id number]) scope was introduced through the anus                            and advanced to the the sigmoid colon. The flexible                             sigmoidoscopy was accomplished without difficulty.                            The patient tolerated the procedure well. The                            quality of the bowel preparation was good. Scope In: Scope Out: Findings:                 The digital rectal exam findings include mild                            narrowing, change in texture of rectal mucosa                            consistent with endoscopic findings.                           The sigmoid colon appeared normal.                           A large ulcer was found in the distal rectum. It                            appeared to have extended slightly more proximally                            and laterally since it was last evaluated over a                            year ago. No appreciable malignancy tissue.                            Multiple biopsies were taken with a cold forceps                            for histology.  Complications:            No immediate complications. Estimated blood loss:                            Minimal. Estimated Blood Loss:     Estimated blood loss was minimal. Impression:               - Mild narrowing, change in texture of rectal                            mucosa found on digital rectal exam.                           - The sigmoid colon is normal.                           - Large distal ulcer in the rectum, I suspect a                            stercoral ulceration from pelvic floor dyssynergia.                            Biopsied. Recommendation:           - Discharge patient to home.                           - Resume previous diet.                           - Continue present medications.                           - Await pathology results. Pending pathology                            results continue to not show any significant                            pathology, plan to refer to pelvic floor PT for                            treatment of dyssynergia Tilden Broz P. Leopold Smyers MD,  MD 05/07/2017 12:03:00 PM This report has been signed electronically.

## 2017-05-07 NOTE — Progress Notes (Signed)
Called to room to assist during endoscopic procedure.  Patient ID and intended procedure confirmed with present staff. Received instructions for my participation in the procedure from the performing physician.  

## 2017-05-07 NOTE — Patient Instructions (Signed)
YOU HAD AN ENDOSCOPIC PROCEDURE TODAY AT THE [facility]:   Refer to the procedure report that was given to you for any specific questions about what was found during the examination.  If the procedure report does not answer your questions, please call your gastroenterologist to clarify.  If you requested that your care partner not be given the details of your procedure findings, then the procedure report has been included in a sealed envelope for you to review at your convenience later.  YOU SHOULD EXPECT: Some feelings of bloating in the abdomen. Passage of more gas than usual.  Walking can help get rid of the air that was put into your GI tract during the procedure and reduce the bloating. If you had a lower endoscopy (such as a colonoscopy or flexible sigmoidoscopy) you may notice spotting of blood in your stool or on the toilet paper. If you underwent a bowel prep for your procedure, you may not have a normal bowel movement for a few days.  Please Note:  You might notice some irritation and congestion in your nose or some drainage.  This is from the oxygen used during your procedure.  There is no need for concern and it should clear up in a day or so.  SYMPTOMS TO REPORT IMMEDIATELY:   Following lower endoscopy (colonoscopy or flexible sigmoidoscopy):  Excessive amounts of blood in the stool  Significant tenderness or worsening of abdominal pains  Swelling of the abdomen that is new, acute  Fever of 100F or higher  For urgent or emergent issues, a gastroenterologist can be reached at any hour by calling [phone].   DIET:  We do recommend a small meal at first, but then you may proceed to your regular diet.  Drink plenty of fluids but you should avoid alcoholic beverages for 24 hours.  ACTIVITY:  You should plan to take it easy for the rest of today and you should NOT DRIVE or use heavy machinery until tomorrow (because of the sedation medicines used during the test).     FOLLOW UP: Our staff will call the number listed on your records the next business day following your procedure to check on you and address any questions or concerns that you may have regarding the information given to you following your procedure. If we do not reach you, we will leave a message.  However, if you are feeling well and you are not experiencing any problems, there is no need to return our call.  We will assume that you have returned to your regular daily activities without incident.  If any biopsies were taken you will be contacted by phone or by letter within the next 1-3 weeks.  Please call us at [phone] if you have not heard about the biopsies in 3 weeks.    SIGNATURES/CONFIDENTIALITY: You and/or your care partner have signed paperwork which will be entered into your electronic medical record.  These signatures attest to the fact that that the information above on your After Visit Summary has been reviewed and is understood.  Full responsibility of the confidentiality of this discharge information lies with you and/or your care-partner. 

## 2017-05-08 ENCOUNTER — Telehealth: Payer: Self-pay | Admitting: *Deleted

## 2017-05-08 NOTE — Telephone Encounter (Signed)
  Follow up Call-  Call back number 05/07/2017 04/11/2016  Post procedure Call Back phone  # [phone] or [phone]  Permission to leave phone message Yes Yes  Some recent data might be hidden     Patient questions:  Do you have a fever, pain , or abdominal swelling? No. Pain Score  0 *  Have you tolerated food without any problems? Yes.    Have you been able to return to your normal activities? Yes.    Do you have any questions about your discharge instructions: Diet   No. Medications  No. Follow up visit  No.  Do you have questions or concerns about your Care? No.  Actions: * If pain score is 4 or above: No action needed, pain <4.

## 2017-05-15 ENCOUNTER — Other Ambulatory Visit: Payer: Self-pay

## 2017-05-15 DIAGNOSIS — K5900 Constipation, unspecified: Secondary | ICD-10-CM

## 2017-05-15 DIAGNOSIS — R278 Other lack of coordination: Secondary | ICD-10-CM

## 2017-05-15 DIAGNOSIS — K626 Ulcer of anus and rectum: Secondary | ICD-10-CM

## 2017-05-16 ENCOUNTER — Ambulatory Visit: Payer: Medicare Other | Attending: Gastroenterology | Admitting: Physical Therapy

## 2017-05-16 ENCOUNTER — Encounter: Payer: Self-pay | Admitting: Physical Therapy

## 2017-05-16 DIAGNOSIS — R279 Unspecified lack of coordination: Secondary | ICD-10-CM | POA: Diagnosis not present

## 2017-05-16 DIAGNOSIS — R252 Cramp and spasm: Secondary | ICD-10-CM | POA: Diagnosis not present

## 2017-05-16 NOTE — Therapy (Signed)
[facility] [address] [place], [place], [address] Phone: [phone]   Fax:  [phone]  Physical Therapy Evaluation  Patient Details  Name: [name] MRN: [id number] Date of Birth: 06/27/1931 Referring Provider: Yetta Flock, MD   Encounter Date: 05/16/2017  PT End of Session - 05/16/17 1137    Visit Number  1    Date for PT Re-Evaluation  06/27/17    Authorization Type  [employer]    PT Start Time  1138    PT Stop Time  1230    PT Time Calculation (min)  52 min    Activity Tolerance  Patient tolerated treatment well    Behavior During Therapy  [facility] for tasks assessed/performed       Past Medical History:  Diagnosis Date  . Anemia   . Atrial fibrillation ([place])   . Blood transfusion without reported diagnosis   . Chronic constipation    colace helps  . Chronic renal insufficiency, stage II (mild)    CrCl @60 .  . Chronic venous insufficiency   . Diabetes mellitus without complication (HCC)    hx of good control  . Gout   . Hyperlipidemia   . Hypertension   . Stercoral ulcer of rectum   . Varicose veins of both lower extremities with complications   . Vitamin B12 deficiency     Past Surgical History:  Procedure Laterality Date  . BUNIONECTOMY  2006  . COLONOSCOPY N/A 01/22/2016   Procedure: COLONOSCOPY;  Surgeon: Manus Gunning, MD;  Location: [facility];  Service: Gastroenterology;  Laterality: N/A;  . [place]  . TONSILLECTOMY AND ADENOIDECTOMY  1945    There were no vitals filed for this visit.   Subjective Assessment - 05/16/17 1147    Subjective  Pt reports having an ulcer that ruptured.  I have a BM daily with medication and manual assistance to have BM.  Pt states he had a hemorrhoid surgery years ago that was "botched".  At one time he used an ointment to help with BM but does not think it still exists    Limitations  Other (comment) bowel movement    Patient Stated Goals  be able to have bowel movement more easily    Currently in Pain?  No/denies         [facility] PT Assessment - 05/16/17 0001      Assessment   Medical Diagnosis  K62.6 (ICD-10-CM) - Stercoral ulcer of rectum;R27.8 (ICD-10-CM) - Dyssynergia;K59.00 (ICD-10-CM) - Constipation, unspecified constipation type    Referring Provider  Armbruster, Carlota Raspberry, MD    Prior Therapy  no      Precautions   Precautions  None      Restrictions   Weight Bearing Restrictions  No      Balance Screen   Has the patient fallen in the past 6 months  No      [place] residence    Living Arrangements  Spouse/significant other      Prior Function   Level of Independence  Independent      Cognition   Overall Cognitive Status  Within Functional Limits for tasks assessed      Posture/Postural Control   Posture/Postural Control  Postural limitations    Postural Limitations  Rounded Shoulders;Increased thoracic kyphosis;Anterior pelvic tilt      ROM / Strength   AROM / PROM / Strength  PROM  PROM   Overall PROM Comments  bilateral hip flexion and IR 25% limited      Flexibility   Soft Tissue Assessment /Muscle Length  yes                Objective measurements completed on examination: See above findings.    Pelvic Floor Special Questions - 05/16/17 0001    Prior Pelvic/Prostate Exam  Yes    Urinary Leakage  No    Urinary frequency  sometimes has to strain to get complete bladder emptying    Skin Integrity  Hemorroids;Erthema    Pelvic Floor Internal Exam  pt informed and consent given to perform internal soft tissue assessment    Exam Type  Rectal    Palpation  increased tone and hypertrophy external anal sphincter, tight and tender to internal anal sphincter and puborectalis    Strength  good squeeze, good lift, able to hold agaisnt strong resistance    Tone  high               PT Education - 05/16/17 1507     Education provided  Yes    Education Details  Access Code: [email or web address] , toilet techniques    Person(s) Educated  Patient    Methods  Explanation;Demonstration;Verbal cues;Handout    Comprehension  Verbalized understanding;Returned demonstration          PT Long Term Goals - 05/16/17 1509      PT LONG TERM GOAL #1   Title  Pt will be ind with advanced HEP and able to correctly perform toileting techniques    Time  6    Period  Weeks    Status  New    Target Date  06/27/17      PT LONG TERM GOAL #2   Title  pt will report 30% easier bowel movement    Time  6    Period  Weeks    Status  New    Target Date  06/27/17      PT LONG TERM GOAL #3   Title  pt will be able to perform diaphragmatic breathing correctly for improved ability to have bowel movement    Time  6    Period  Weeks    Status  New    Target Date  06/27/17             Plan - 05/16/17 1513    Clinical Impression Statement  Pt presents to skilled PT due to dyssynergia and chronic constipation.  Pt recently had an ulcer in rectum and has been manually assisting BMs for years.  Pt has increased muscle tone in anal sphincter muscles.  He has MMT of 4/5.  Pt is unable to push out one finger and tightens external anal sphincter when attempting to push finger out.  Pt has stiffness of bilateral hips internal rotation and flexion.  Pt has tight hamstrings bilaterally.  He has some fascial adhesions palpated in abdomen . Pt will benefit from skilled PT to address these impairments and improve self care function as well as    History and Personal Factors relevant to plan of care:  chronic constipation. multiple surgeries    Clinical Presentation  Stable    Clinical Presentation due to:  pt is stable with issue that has been chronic and ongoing    Clinical Decision Making  Low    Rehab Potential  Good    PT Frequency  1x / week    PT  Duration  6 weeks    PT Treatment/Interventions  ADLs/Self Care Home  Management;Biofeedback;Cryotherapy;Electrical Stimulation;Moist Heat;Therapeutic activities;Therapeutic exercise;Neuromuscular re-education;Patient/family education;Manual techniques;Passive range of motion;Dry needling;Taping    PT Next Visit Plan  hip and pelvic floor stretches, biofeedback    PT Home Exercise Plan  Access Code: [email or web address]     Consulted and Agree with Plan of Care  Patient       Patient will benefit from skilled therapeutic intervention in order to improve the following deficits and impairments:  Increased muscle spasms, Postural dysfunction, Impaired flexibility, Increased fascial restricitons, Impaired tone, Decreased range of motion  Visit Diagnosis: Unspecified lack of coordination  Cramp and spasm     Problem List Patient Active Problem List   Diagnosis Date Noted  . Long term (current) use of anticoagulants 09/25/2016  . Rectal bleeding 01/21/2016  . Warfarin-induced coagulopathy ([place])   . Chronic constipation 04/29/2015  . Soft tissue mass 04/29/2015  . Chronic venous insufficiency   . Encounter for therapeutic drug monitoring 05/28/2014  . Essential hypertension 03/07/2014  . Preventative health care 03/07/2014  . Varicose veins of lower extremities with other complications 37/62/8315  . Hematoma of leg 09/09/2013  . Diabetes mellitus without complication ([place]) 17/61/6073  . Iron deficiency anemia due to chronic blood loss 08/26/2013  . Hyperlipidemia 08/13/2006  . GOUT 08/13/2006  . ATRIAL FIBRILLATION 08/13/2006    Zannie Cove, PT 05/16/2017, 3:30 PM  [facility] [address], [place], [place], [address] Phone: [phone]   Fax:  [phone]  Name: [name] MRN: [id number] Date of Birth: 08/27/1931

## 2017-05-16 NOTE — Patient Instructions (Signed)
Toileting Techniques for Bowel Movements (Defecation) Using your belly (abdomen) and pelvic floor muscles to have a bowel movement is usually instinctive.  Sometimes people can have problems with these muscles and have to relearn proper defecation (emptying) techniques.  If you have weakness in your muscles, organs that are falling out, decreased sensation in your pelvis, or ignore your urge to go, you may find yourself straining to have a bowel movement.  You are straining if you are: . holding your breath or taking in a huge gulp of air and holding it  . keeping your lips and jaw tensed and closed tightly . turning red in the face because of excessive pushing or forcing . developing or worsening your  hemorrhoids . getting faint while pushing . not emptying completely and have to defecate many times a day  If you are straining, you are actually making it harder for yourself to have a bowel movement.  Many people find they are pulling up with the pelvic floor muscles and closing off instead of opening the anus. Due to lack pelvic floor relaxation and coordination the abdominal muscles, one has to work harder to push the feces out.  Many people have never been taught how to defecate efficiently and effectively.  Notice what happens to your body when you are having a bowel movement.  While you are sitting on the toilet pay attention to the following areas: . Jaw and mouth position . Angle of your hips   . Whether your feet touch the ground or not . Arm placement  . Spine position . Waist . Belly tension . Anus (opening of the anal canal)  An Evacuation/Defecation Plan   Here are the 4 basic points:  1. Lean forward enough for your elbows to rest on your knees 2. Support your feet on the floor or use a low stool if your feet don't touch the floor  3. Push out your belly as if you have swallowed a beach ball-you should feel a widening of your waist 4. Open and relax your pelvic floor muscles,  rather than tightening around the anus      The following conditions my require modifications to your toileting posture:  . If you have had surgery in the past that limits your back, hip, pelvic, knee or ankle flexibility . Constipation   Your healthcare practitioner may make the following additional suggestions and adjustments:  1) Sit on the toilet  a) Make sure your feet are supported. b) Notice your hip angle and spine position-most people find it effective to lean forward or raise their knees, which can help the muscles around the anus to relax  c) When you lean forward, place your forearms on your thighs for support  2) Relax suggestions a) Breath deeply in through your nose and out slowly through your mouth as if you are smelling the flowers and blowing out the candles. b) To become aware of how to relax your muscles, contracting and releasing muscles can be helpful.  Pull your pelvic floor muscles in tightly by using the image of holding back gas, or closing around the anus (visualize making a circle smaller) and lifting the anus up and in.  Then release the muscles and your anus should drop down and feel open. Repeat 5 times ending with the feeling of relaxation. c) Keep your pelvic floor muscles relaxed; let your belly bulge out. d) The digestive tract starts at the mouth and ends at the anal opening, so be   sure to relax both ends of the tube.  Place your tongue on the roof of your mouth with your teeth separated.  This helps relax your mouth and will help to relax the anus at the same time.  3) Empty (defecation) a) Keep your pelvic floor and sphincter relaxed, then bulge your anal muscles.  Make the anal opening wide.  b) Stick your belly out as if you have swallowed a beach ball. c) Make your belly wall hard using your belly muscles while continuing to breathe. Doing this makes it easier to open your anus. d) Breath out and give a grunt (or try using other sounds such as  ahhhh, shhhhh, ohhhh or grrrrrrr).  4) Finish a) As you finish your bowel movement, pull the pelvic floor muscles up and in.  This will leave your anus in the proper place rather than remaining pushed out and down. If you leave your anus pushed out and down, it will start to feel as though that is normal and give you incorrect signals about needing to have a bowel movement.    Access Code: [email or web address]  URL: [email or web address]  Date: 05/16/2017  Prepared by: Lovett Calender   Exercises  Seated Diaphragmatic Breathing - 10 reps - 1 sets - 1x daily - 7x weekly  Seated Hamstring Stretch - 3 reps - 1 sets - 30 sec hold - 1x daily - 7x weekly     [facility] [place] [address] [place], [place] [address]

## 2017-05-21 ENCOUNTER — Other Ambulatory Visit: Payer: Self-pay | Admitting: General Practice

## 2017-05-21 ENCOUNTER — Ambulatory Visit (INDEPENDENT_AMBULATORY_CARE_PROVIDER_SITE_OTHER): Payer: Medicare Other | Admitting: General Practice

## 2017-05-21 DIAGNOSIS — Z7901 Long term (current) use of anticoagulants: Secondary | ICD-10-CM

## 2017-05-21 DIAGNOSIS — I4891 Unspecified atrial fibrillation: Secondary | ICD-10-CM

## 2017-05-21 LAB — POCT INR: INR: 2 (ref 2.0–3.0)

## 2017-05-21 NOTE — Patient Instructions (Addendum)
Pre visit review using our clinic review tool, if applicable. No additional management support is needed unless otherwise documented below in the visit note.  Change dosage and take 1 tablet daily except 1/2 tablet on Mondays.  Re-check in 6 weeks.  

## 2017-05-24 ENCOUNTER — Telehealth: Payer: Self-pay | Admitting: General Practice

## 2017-05-24 NOTE — Telephone Encounter (Signed)
Called pt wife and informed that we do not have an EKG on file for the patient. He was wanting this faxed to the [facility]. Pt wife stated an understanding and will let the pt now.   Copied from [place] [phone]. Topic: Inquiry >> May 24, 2017  2:00 PM Pricilla Handler wrote: Reason for CRM: Patient called requesting the results of his last in office EKG. Patient stated that he needs to have the results faxed to [phone]. Please call patient today.        Thank You!!!!

## 2017-06-04 ENCOUNTER — Other Ambulatory Visit: Payer: Self-pay | Admitting: Family Medicine

## 2017-06-04 NOTE — Telephone Encounter (Signed)
Ok to send this prescription? I do not see where he has had a B12 level checked in at least a year.

## 2017-06-05 NOTE — Telephone Encounter (Signed)
Called and advised pt of PCP recommendations. Pt states that he gave PCP a copy of his [facility] report that shows this is medically necessary and he would like this to be filled. I do not see a record of this in the chart.

## 2017-06-05 NOTE — Telephone Encounter (Signed)
Pt has appt at end of month- will recheck B12 at that time and refill if needed

## 2017-06-06 MED ORDER — CYANOCOBALAMIN 1000 MCG/ML IJ SOLN: 1000.0000 ug | INTRAMUSCULAR | 12 refills | Status: DC

## 2017-06-06 NOTE — Telephone Encounter (Signed)
Ok to refill 

## 2017-06-06 NOTE — Addendum Note (Signed)
Addended by: Davis Gourd on: 06/06/2017 08:17 AM   Modules accepted: Orders

## 2017-06-06 NOTE — Telephone Encounter (Signed)
Medication filled to pharmacy as requested.   

## 2017-06-13 ENCOUNTER — Encounter: Payer: Self-pay | Admitting: General Practice

## 2017-06-27 NOTE — Progress Notes (Addendum)
Subjective:   [name] is a 84 y.o. male who presents for Medicare Annual/Subsequent preventive examination.  Review of Systems:  No ROS.  Medicare Wellness Visit. Additional risk factors are reflected in the social history.  Cardiac Risk Factors include: dyslipidemia;male gender;hypertension;advanced age (>71men, >65 women);diabetes mellitus;obesity (BMI >30kg/m2)   Sleep patterns: Sleeps 6-7 hours.  Home Safety/Smoke Alarms: Feels safe in home. Smoke alarms in place.  Living environment; residence and Firearm Safety: Lives with wife in 2 story with basement.  Seat Belt Safety/Bike Helmet: Wears seat belt.   Male:   CCS-Colonoscopy 01/22/2016, polyps. Recall 5 years.       PSA-  Lab Results  Component Value Date   PSA 3.89 12/20/2015   PSA 2.34 02/06/2006       Objective:    Vitals: BP 122/60 (BP Location: Left Arm, Patient Position: Sitting, Cuff Size: Normal)   Pulse 63   Temp (!) 97.4 F (36.3 C) (Temporal)   Resp 16   Ht 5\' 10"  (1.778 m)   Wt 236 lb (107 kg)   SpO2 99%   BMI 33.86 kg/m   Body mass index is 33.86 kg/m.  Advanced Directives 06/28/2017 04/11/2016 01/21/2016 01/21/2016  Does Patient Have a Medical Advance Directive? Yes Yes Yes Yes  Type of Paramedic of [place];Living will - - Living will  Does patient want to make changes to medical advance directive? - - No - Patient declined -  Copy of [place] in Chart? Yes - - -    Tobacco Social History   Tobacco Use  Smoking Status Former Smoker  . Types: Cigarettes  Smokeless Tobacco Never Used     Counseling given: Not Answered    Past Medical History:  Diagnosis Date  . Anemia   . Atrial fibrillation ([place])   . Blood transfusion without reported diagnosis   . Chronic constipation    colace helps  . Chronic renal insufficiency, stage II (mild)    CrCl @60 .  . Chronic venous insufficiency   . Diabetes mellitus without complication (HCC)    hx of good control  . Gout   . Hyperlipidemia   . Hypertension   . Osteopenia   . Stercoral ulcer of rectum   . Varicose veins of both lower extremities with complications   . Vitamin B12 deficiency    Past Surgical History:  Procedure Laterality Date  . BUNIONECTOMY  2006  . COLONOSCOPY N/A 01/22/2016   Procedure: COLONOSCOPY;  Surgeon: Manus Gunning, MD;  Location: [facility];  Service: Gastroenterology;  Laterality: N/A;  . [place]  . TONSILLECTOMY AND ADENOIDECTOMY  1945   Family History  Problem Relation Age of Onset  . Alcohol abuse Father   . Diabetes Father   . Obesity Brother   . Obesity Daughter        S/P gastric bypass   Social History   Socioeconomic History  . Marital status: Married    Spouse name: Not on file  . Number of children: 3  . Years of education: Not on file  . Highest education level: Not on file  Occupational History  . Occupation: retired  Scientific laboratory technician  . Financial resource strain: Not on file  . Food insecurity:    Worry: Not on file    Inability: Not on file  . Transportation needs:    Medical: Not on file    Non-medical: Not on file  Tobacco Use  .  Smoking status: Former Smoker    Types: Cigarettes  . Smokeless tobacco: Never Used  Substance and Sexual Activity  . Alcohol use: No  . Drug use: No  . Sexual activity: Not on file  Lifestyle  . Physical activity:    Days per week: Not on file    Minutes per session: Not on file  . Stress: Not on file  Relationships  . Social connections:    Talks on phone: Not on file    Gets together: Not on file    Attends religious service: Not on file    Active member of club or organization: Not on file    Attends meetings of clubs or organizations: Not on file    Relationship status: Not on file  Other Topics Concern  . Not on file  Social History Narrative   Married, 3 daughters, 4 grandchildren.      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   Retired 2012.      Remote smoking hx: quit 55 yrs ago.  No alcohol in 55 yrs.       Outpatient Encounter Medications as of 06/28/2017  Medication Sig  . allopurinol (ZYLOPRIM) 300 MG tablet TAKE 1 TABLET (300 MG TOTAL) BY MOUTH DAILY.  . bisacodyl (DULCOLAX) 5 MG EC tablet Take 2 tablets (10 mg total) by mouth daily as needed for moderate constipation. (Patient taking differently: Take 5 mg by mouth daily as needed for moderate constipation. Pt takes 1 tablet daily as needed)  . cyanocobalamin (,VITAMIN B-12,) 1000 MCG/ML injection Inject 1 mL (1,000 mcg total) into the muscle every 30 (thirty) days.  . ferrous sulfate 325 (65 FE) MG tablet Take 325 mg by mouth daily.   . furosemide (LASIX) 20 MG tablet TAKE 1 TABLET BY MOUTH EVERY DAY  . lisinopril (PRINIVIL,ZESTRIL) 2.5 MG tablet TAKE 1 TABLET BY MOUTH EVERY DAY  . metFORMIN (GLUCOPHAGE) 500 MG tablet TAKE 1 TABLET DAILY  . metoprolol succinate (TOPROL-XL) 50 MG 24 hr tablet TAKE 1 TABLET (50 MG TOTAL) BY MOUTH DAILY.  . simvastatin (ZOCOR) 20 MG tablet TAKE 1 TABLET (20 MG TOTAL) BY MOUTH DAILY.  Marland Kitchen warfarin (COUMADIN) 5 MG tablet TAKE AS DIRECTED BY ANTICOAGULATION CLINIC  . Zoster Vaccine Adjuvanted [facility]) injection Inject 0.5 mLs into the muscle once for 1 dose.   Facility-Administered Encounter Medications as of 06/28/2017  Medication  . 0.9 %  sodium chloride infusion    Activities of Daily Living In your present state of health, do you have any difficulty performing the following activities: 06/28/2017 09/19/2016  Hearing? N N  Vision? N N  Difficulty concentrating or making decisions? N N  Walking or climbing stairs? N N  Dressing or bathing? N N  Doing errands, shopping? N N  Preparing Food and eating ? N -  Using the Toilet? N -  In the past six months, have you accidently leaked urine? N -  Do you have problems with loss of bowel control? N -  Managing your  Medications? N -  Managing your Finances? N -  Housekeeping or managing your Housekeeping? N -  Some recent data might be hidden    Patient Care Team: Midge Minium, MD as PCP - General (Family Medicine) Elza Rafter, MD as Consulting Physician (Family Medicine) Armbruster, Carlota Raspberry, MD as Consulting Physician (Gastroenterology) [facility] (Internal Medicine)   Assessment:   This is a routine wellness examination for  [name].  Exercise Activities and Dietary recommendations Exercise limited by: None identified   Diet (meal preparation, eat out, water intake, caffeinated beverages, dairy products, fruits and vegetables): Drinks water, diet/caffeine free soda.   Breakfast: sausage biscuit; english muffin; toast; grits; gravy; pork chops Lunch/Dinner: pork chops, sweet potato, beans  Typically eats 2 meals and snacks.    Goals    . Patient Stated     Maintain current health.        Fall Risk Fall Risk  06/28/2017 12/28/2016 09/19/2016 12/20/2015 04/29/2015  Falls in the past year? No No No No No    Depression Screen PHQ 2/9 Scores 06/28/2017 12/28/2016 09/19/2016 08/07/2016  PHQ - 2 Score 0 0 0 0  PHQ- 9 Score - - 0 0    Cognitive Function MMSE - Mini Mental State Exam 06/28/2017  Orientation to time 5  Orientation to Place 5  Registration 3  Attention/ Calculation 5  Recall 2  Language- name 2 objects 2  Language- repeat 0  Language- follow 3 step command 3  Language- read & follow direction 1  Write a sentence 1  Copy design 1  Total score 28        Immunization History  Administered Date(s) Administered  . Pneumococcal Conjugate-13 02/26/2014  . Pneumococcal Polysaccharide-23 01/02/2006, 12/20/2015  . Td 01/03/2000  . Tdap 02/26/2014    Screening Tests Health Maintenance  Topic Date Due  . HEMOGLOBIN A1C  08/20/2017  . FOOT EXAM  09/19/2017  . OPHTHALMOLOGY EXAM  03/03/2018  . TETANUS/TDAP  02/27/2024  . PNA vac Low Risk Adult  Completed         Plan:    Shingles vaccine at pharmacy.   Continue doing brain stimulating activities (puzzles, reading, adult coloring books, staying active) to keep memory sharp.   I have personally reviewed and noted the following in the patient's chart:   . Medical and social history . Use of alcohol, tobacco or illicit drugs  . Current medications and supplements . Functional ability and status . Nutritional status . Physical activity . Advanced directives . List of other physicians . Hospitalizations, surgeries, and ER visits in previous 12 months . Vitals . Screenings to include cognitive, depression, and falls . Referrals and appointments  In addition, I have reviewed and discussed with patient certain preventive protocols, quality metrics, and best practice recommendations. A written personalized care plan for preventive services as well as general preventive health recommendations were provided to patient.     Gerilyn Nestle, RN  06/28/2017  Reviewed documentation provided by RN and agree w/ above.  [name], MD

## 2017-06-28 ENCOUNTER — Ambulatory Visit (INDEPENDENT_AMBULATORY_CARE_PROVIDER_SITE_OTHER): Payer: Medicare Other

## 2017-06-28 ENCOUNTER — Encounter: Payer: Self-pay | Admitting: Family Medicine

## 2017-06-28 ENCOUNTER — Other Ambulatory Visit: Payer: Self-pay

## 2017-06-28 ENCOUNTER — Ambulatory Visit (INDEPENDENT_AMBULATORY_CARE_PROVIDER_SITE_OTHER): Payer: Medicare Other | Admitting: Family Medicine

## 2017-06-28 VITALS — BP 122/60 | HR 63 | Temp 97.4°F | Resp 16 | Ht 70.0 in | Wt 236.0 lb

## 2017-06-28 DIAGNOSIS — I482 Chronic atrial fibrillation, unspecified: Secondary | ICD-10-CM

## 2017-06-28 DIAGNOSIS — E119 Type 2 diabetes mellitus without complications: Secondary | ICD-10-CM

## 2017-06-28 DIAGNOSIS — Z0000 Encounter for general adult medical examination without abnormal findings: Secondary | ICD-10-CM | POA: Diagnosis not present

## 2017-06-28 DIAGNOSIS — E782 Mixed hyperlipidemia: Secondary | ICD-10-CM

## 2017-06-28 DIAGNOSIS — E669 Obesity, unspecified: Secondary | ICD-10-CM | POA: Diagnosis not present

## 2017-06-28 DIAGNOSIS — I10 Essential (primary) hypertension: Secondary | ICD-10-CM

## 2017-06-28 DIAGNOSIS — Z23 Encounter for immunization: Secondary | ICD-10-CM

## 2017-06-28 MED ORDER — ZOSTER VAC RECOMB ADJUVANTED 50 MCG/0.5ML IM SUSR: 0.5000 mL | Freq: Once | INTRAMUSCULAR | 1 refills | Status: AC

## 2017-06-28 NOTE — Progress Notes (Signed)
   Subjective:    Patient ID: [name], male    DOB: [birth year], 84 y.o.   MRN: [id number]  HPI HTN- chronic problem, on Lasix, Lisinopril, Metoprolol.  No CP, SOB, HAs, visual changes, edema above baseline.  DM- chronic problem.  On Metformin 500mg  daily.  On ACE for renal protection.  UTD on eye exam, foot exam.  Denies symptomatic lows.  No numbness/tingling of hands/feet.  Chronic Afib- ongoing issue.  On coumadin for anticoagulation.  Rate controlled w/ Metoprolol.  asymptomatic- denies palpitations.  Hyperlipidemia- chronic problem, on Simvastatin 20mg  daily.  No abd pain, N/V  Review of Systems For ROS see HPI     Objective:   Physical Exam  Constitutional: He is oriented to person, place, and time. He appears well-developed and well-nourished. No distress.  obese  HENT:  Head: Normocephalic and atraumatic.  Eyes: Pupils are equal, round, and reactive to light. Conjunctivae and EOM are normal.  Neck: Normal range of motion. Neck supple. No thyromegaly present.  Cardiovascular: Normal rate, normal heart sounds and intact distal pulses.  No murmur heard. Irregularly irregular S1/S2  Pulmonary/Chest: Effort normal and breath sounds normal. No respiratory distress.  Abdominal: Soft. Bowel sounds are normal. He exhibits no distension.  Musculoskeletal: He exhibits no edema.  Lymphadenopathy:    He has no cervical adenopathy.  Neurological: He is alert and oriented to person, place, and time. No cranial nerve deficit.  Skin: Skin is warm and dry.  Psychiatric: He has a normal mood and affect. His behavior is normal.  Vitals reviewed.         Assessment & Plan:

## 2017-06-28 NOTE — Assessment & Plan Note (Addendum)
Chronic problem.  Tolerating statin w/o difficulty.  Check labs.  Adjust meds prn  

## 2017-06-28 NOTE — Assessment & Plan Note (Signed)
Chronic problem.  On Metformin w/o difficulty.  UTD on eye exam, foot exam, and on ACE for renal protection.  Stressed need for healthy diet and regular exercise.  Check labs.  Adjust meds prn

## 2017-06-28 NOTE — Patient Instructions (Signed)
Follow up in 3-4 months to recheck diabetes We'll notify you of your lab results and make any changes if needed Continue to work on healthy diet and regular exercise- you can do it! Keep up the good work!!! Call with any questions or concerns Have a great summer!!

## 2017-06-28 NOTE — Assessment & Plan Note (Signed)
Chronic problem.  Well controlled today.  Asymptomatic.  Check labs.  No anticipated med changes.  Will follow. 

## 2017-06-28 NOTE — Patient Instructions (Addendum)
Shingles vaccine at pharmacy.   Continue doing brain stimulating activities (puzzles, reading, adult coloring books, staying active) to keep memory sharp.    Health Maintenance, Male A healthy lifestyle and preventive care is important for your health and wellness. Ask your health care provider about what schedule of regular examinations is right for you. What should I know about weight and diet? Eat a Healthy Diet  Eat plenty of vegetables, fruits, whole grains, low-fat dairy products, and lean protein.  Do not eat a lot of foods high in solid fats, added sugars, or salt.  Maintain a Healthy Weight Regular exercise can help you achieve or maintain a healthy weight. You should:  Do at least 150 minutes of exercise each week. The exercise should increase your heart rate and make you sweat (moderate-intensity exercise).  Do strength-training exercises at least twice a week.  Watch Your Levels of Cholesterol and Blood Lipids  Have your blood tested for lipids and cholesterol every 5 years starting at 82 years of age. If you are at high risk for heart disease, you should start having your blood tested when you are 82 years old. You may need to have your cholesterol levels checked more often if: ? Your lipid or cholesterol levels are high. ? You are older than 82 years of age. ? You are at high risk for heart disease.  What should I know about cancer screening? Many types of cancers can be detected early and may often be prevented. Lung Cancer  You should be screened every year for lung cancer if: ? You are a current smoker who has smoked for at least 30 years. ? You are a former smoker who has quit within the past 15 years.  Talk to your health care provider about your screening options, when you should start screening, and how often you should be screened.  Colorectal Cancer  Routine colorectal cancer screening usually begins at 82 years of age and should be repeated every 5-10  years until you are 82 years old. You may need to be screened more often if early forms of precancerous polyps or small growths are found. Your health care provider may recommend screening at an earlier age if you have risk factors for colon cancer.  Your health care provider may recommend using home test kits to check for hidden blood in the stool.  A small camera at the end of a tube can be used to examine your colon (sigmoidoscopy or colonoscopy). This checks for the earliest forms of colorectal cancer.  Prostate and Testicular Cancer  Depending on your age and overall health, your health care provider may do certain tests to screen for prostate and testicular cancer.  Talk to your health care provider about any symptoms or concerns you have about testicular or prostate cancer.  Skin Cancer  Check your skin from head to toe regularly.  Tell your health care provider about any new moles or changes in moles, especially if: ? There is a change in a mole's size, shape, or color. ? You have a mole that is larger than a pencil eraser.  Always use sunscreen. Apply sunscreen liberally and repeat throughout the day.  Protect yourself by wearing long sleeves, pants, a wide-brimmed hat, and sunglasses when outside.  What should I know about heart disease, diabetes, and high blood pressure?  If you are 18-39 years of age, have your blood pressure checked every 3-5 years. If you are 40 years of age or older, have   your blood pressure checked every year. You should have your blood pressure measured twice-once when you are at a hospital or clinic, and once when you are not at a hospital or clinic. Record the average of the two measurements. To check your blood pressure when you are not at a hospital or clinic, you can use: ? An automated blood pressure machine at a pharmacy. ? A home blood pressure monitor.  Talk to your health care provider about your target blood pressure.  If you are between  45-79 years old, ask your health care provider if you should take aspirin to prevent heart disease.  Have regular diabetes screenings by checking your fasting blood sugar level. ? If you are at a normal weight and have a low risk for diabetes, have this test once every three years after the age of 45. ? If you are overweight and have a high risk for diabetes, consider being tested at a younger age or more often.  A one-time screening for abdominal aortic aneurysm (AAA) by ultrasound is recommended for men aged 65-75 years who are current or former smokers. What should I know about preventing infection? Hepatitis B If you have a higher risk for hepatitis B, you should be screened for this virus. Talk with your health care provider to find out if you are at risk for hepatitis B infection. Hepatitis C Blood testing is recommended for:  Everyone born from 1945 through 1965.  Anyone with known risk factors for hepatitis C.  Sexually Transmitted Diseases (STDs)  You should be screened each year for STDs including gonorrhea and chlamydia if: ? You are sexually active and are younger than 82 years of age. ? You are older than 82 years of age and your health care provider tells you that you are at risk for this type of infection. ? Your sexual activity has changed since you were last screened and you are at an increased risk for chlamydia or gonorrhea. Ask your health care provider if you are at risk.  Talk with your health care provider about whether you are at high risk of being infected with HIV. Your health care provider may recommend a prescription medicine to help prevent HIV infection.  What else can I do?  Schedule regular health, dental, and eye exams.  Stay current with your vaccines (immunizations).  Do not use any tobacco products, such as cigarettes, chewing tobacco, and e-cigarettes. If you need help quitting, ask your health care provider.  Limit alcohol intake to no more than  2 drinks per day. One drink equals 12 ounces of beer, 5 ounces of wine, or 1 ounces of hard liquor.  Do not use street drugs.  Do not share needles.  Ask your health care provider for help if you need support or information about quitting drugs.  Tell your health care provider if you often feel depressed.  Tell your health care provider if you have ever been abused or do not feel safe at home. This information is not intended to replace advice given to you by your health care provider. Make sure you discuss any questions you have with your health care provider. Document Released: 06/17/2007 Document Revised: 08/18/2015 Document Reviewed: 09/22/2014 Elsevier Interactive Patient Education  2018 [employer]  

## 2017-06-28 NOTE — Assessment & Plan Note (Signed)
Chronic problem.  Asymptomatic.  Rate controlled and on Coumadin.  Will continue to follow.

## 2017-06-29 ENCOUNTER — Other Ambulatory Visit (INDEPENDENT_AMBULATORY_CARE_PROVIDER_SITE_OTHER): Payer: Medicare Other

## 2017-06-29 DIAGNOSIS — R748 Abnormal levels of other serum enzymes: Secondary | ICD-10-CM | POA: Diagnosis not present

## 2017-06-29 LAB — HEPATIC FUNCTION PANEL
ALK PHOS: 134 U/L — AB (ref 39–117)
ALT: 11 U/L (ref 0–53)
AST: 17 U/L (ref 0–37)
Albumin: 3.6 g/dL (ref 3.5–5.2)
BILIRUBIN DIRECT: 0.3 mg/dL (ref 0.0–0.3)
BILIRUBIN TOTAL: 0.7 mg/dL (ref 0.2–1.2)
Total Protein: 6.2 g/dL (ref 6.0–8.3)

## 2017-06-29 LAB — CBC WITH DIFFERENTIAL/PLATELET
BASOS PCT: 1 % (ref 0.0–3.0)
Basophils Absolute: 0.1 10*3/uL (ref 0.0–0.1)
EOS PCT: 3.3 % (ref 0.0–5.0)
Eosinophils Absolute: 0.2 10*3/uL (ref 0.0–0.7)
HCT: 35.1 % — ABNORMAL LOW (ref 39.0–52.0)
Hemoglobin: 11.4 g/dL — ABNORMAL LOW (ref 13.0–17.0)
LYMPHS ABS: 1.1 10*3/uL (ref 0.7–4.0)
Lymphocytes Relative: 18.6 % (ref 12.0–46.0)
MCHC: 32.5 g/dL (ref 30.0–36.0)
MCV: 91 fl (ref 78.0–100.0)
MONO ABS: 0.8 10*3/uL (ref 0.1–1.0)
MONOS PCT: 12.6 % — AB (ref 3.0–12.0)
NEUTROS PCT: 64.5 % (ref 43.0–77.0)
Neutro Abs: 4 10*3/uL (ref 1.4–7.7)
Platelets: 269 10*3/uL (ref 150.0–400.0)
RBC: 3.85 Mil/uL — AB (ref 4.22–5.81)
RDW: 16.3 % — AB (ref 11.5–15.5)
WBC: 6.2 10*3/uL (ref 4.0–10.5)

## 2017-06-29 LAB — LIPID PANEL
CHOLESTEROL: 111 mg/dL (ref 0–200)
HDL: 41.4 mg/dL (ref >39.00)
LDL CALC: 56 mg/dL (ref 0–99)
NonHDL: 70
TRIGLYCERIDES: 70 mg/dL (ref 0.0–149.0)
Total CHOL/HDL Ratio: 3
VLDL: 14 mg/dL (ref 0.0–40.0)

## 2017-06-29 LAB — BASIC METABOLIC PANEL
BUN: 19 mg/dL (ref 6–23)
CO2: 30 mEq/L (ref 19–32)
CREATININE: 1.38 mg/dL (ref 0.40–1.50)
Calcium: 9.3 mg/dL (ref 8.4–10.5)
Chloride: 103 mEq/L (ref 96–112)
GFR: 52.14 mL/min — ABNORMAL LOW (ref >60.00)
GLUCOSE: 101 mg/dL — AB (ref 70–99)
Potassium: 4.4 mEq/L (ref 3.5–5.1)
Sodium: 140 mEq/L (ref 135–145)

## 2017-06-29 LAB — TSH: TSH: 1.3 u[IU]/mL (ref 0.35–4.50)

## 2017-06-29 LAB — GAMMA GT: GGT: 47 U/L (ref 7–51)

## 2017-06-29 LAB — HEMOGLOBIN A1C: Hgb A1c MFr Bld: 6.1 % (ref 4.6–6.5)

## 2017-06-30 ENCOUNTER — Other Ambulatory Visit: Payer: Self-pay | Admitting: Family Medicine

## 2017-07-02 ENCOUNTER — Ambulatory Visit: Payer: Medicare Other

## 2017-07-04 ENCOUNTER — Ambulatory Visit (INDEPENDENT_AMBULATORY_CARE_PROVIDER_SITE_OTHER): Payer: Medicare Other | Admitting: General Practice

## 2017-07-04 DIAGNOSIS — Z7901 Long term (current) use of anticoagulants: Secondary | ICD-10-CM | POA: Diagnosis not present

## 2017-07-04 DIAGNOSIS — I4891 Unspecified atrial fibrillation: Secondary | ICD-10-CM

## 2017-07-04 LAB — POCT INR: INR: 1.5 — AB (ref 2.0–3.0)

## 2017-07-04 NOTE — Patient Instructions (Addendum)
Pre visit review using our clinic review tool, if applicable. No additional management support is needed unless otherwise documented below in the visit note.  Take 1 1/2 tablets today and tomorrow and then continue to take 1 tablet daily except 1/2 tablet on Mondays.  Re-check in 3 to 4 weeks.

## 2017-07-18 ENCOUNTER — Ambulatory Visit (INDEPENDENT_AMBULATORY_CARE_PROVIDER_SITE_OTHER): Payer: Medicare Other | Admitting: General Practice

## 2017-07-18 DIAGNOSIS — Z7901 Long term (current) use of anticoagulants: Secondary | ICD-10-CM

## 2017-07-18 DIAGNOSIS — I4891 Unspecified atrial fibrillation: Secondary | ICD-10-CM

## 2017-07-18 LAB — POCT INR: INR: 2.7 (ref 2.0–3.0)

## 2017-07-18 NOTE — Patient Instructions (Addendum)
Pre visit review using our clinic review tool, if applicable. No additional management support is needed unless otherwise documented below in the visit note.  Continue to take 1 tablet daily except 1/2 tablet on Mondays.  Re-check in 3 to 4 weeks.

## 2017-07-23 ENCOUNTER — Other Ambulatory Visit: Payer: Self-pay | Admitting: Family Medicine

## 2017-08-01 ENCOUNTER — Ambulatory Visit: Payer: Medicare Other

## 2017-08-13 ENCOUNTER — Ambulatory Visit (INDEPENDENT_AMBULATORY_CARE_PROVIDER_SITE_OTHER): Payer: Medicare Other | Admitting: General Practice

## 2017-08-13 DIAGNOSIS — Z7901 Long term (current) use of anticoagulants: Secondary | ICD-10-CM | POA: Diagnosis not present

## 2017-08-13 DIAGNOSIS — I4891 Unspecified atrial fibrillation: Secondary | ICD-10-CM

## 2017-08-13 LAB — POCT INR: INR: 2.6 (ref 2.0–3.0)

## 2017-08-13 NOTE — Patient Instructions (Signed)
Pre visit review using our clinic review tool, if applicable. No additional management support is needed unless otherwise documented below in the visit note. ° °Continue to take 1 tablet daily except 1/2 tablet on Mondays.  Re-check in 4 weeks. ° °

## 2017-08-19 ENCOUNTER — Other Ambulatory Visit: Payer: Self-pay | Admitting: Family Medicine

## 2017-08-28 ENCOUNTER — Other Ambulatory Visit: Payer: Self-pay | Admitting: Family Medicine

## 2017-08-28 DIAGNOSIS — R609 Edema, unspecified: Secondary | ICD-10-CM

## 2017-09-10 ENCOUNTER — Ambulatory Visit: Payer: Medicare Other

## 2017-09-28 ENCOUNTER — Ambulatory Visit (INDEPENDENT_AMBULATORY_CARE_PROVIDER_SITE_OTHER): Payer: Medicare Other | Admitting: Family Medicine

## 2017-09-28 ENCOUNTER — Encounter: Payer: Self-pay | Admitting: Family Medicine

## 2017-09-28 ENCOUNTER — Other Ambulatory Visit: Payer: Self-pay

## 2017-09-28 VITALS — BP 120/68 | HR 76 | Temp 97.9°F | Resp 16 | Ht 70.0 in | Wt 234.4 lb

## 2017-09-28 DIAGNOSIS — E119 Type 2 diabetes mellitus without complications: Secondary | ICD-10-CM | POA: Diagnosis not present

## 2017-09-28 LAB — BASIC METABOLIC PANEL
BUN: 24 mg/dL — AB (ref 6–23)
CO2: 29 mEq/L (ref 19–32)
Calcium: 8.8 mg/dL (ref 8.4–10.5)
Chloride: 105 mEq/L (ref 96–112)
Creatinine, Ser: 1.31 mg/dL (ref 0.40–1.50)
GFR: 55.34 mL/min — ABNORMAL LOW (ref >60.00)
Glucose, Bld: 95 mg/dL (ref 70–99)
Potassium: 4.3 mEq/L (ref 3.5–5.1)
Sodium: 139 mEq/L (ref 135–145)

## 2017-09-28 LAB — HEMOGLOBIN A1C: HEMOGLOBIN A1C: 6.3 % (ref 4.6–6.5)

## 2017-09-28 NOTE — Patient Instructions (Signed)
Schedule your complete physical in 3-4 months We'll notify you of your lab results and make any changes if needed Continue to work on healthy diet and exercise as you are able Call with any questions or concerns Happy Fall!!!

## 2017-09-28 NOTE — Assessment & Plan Note (Signed)
Chronic problem.  Tolerating metformin w/o difficulty.  UTD on eye exam, on ACE for renal protection.  Foot exam done today.  Check labs.  Adjust meds prn

## 2017-09-28 NOTE — Progress Notes (Signed)
   Subjective:    Patient ID: [name], male    DOB: Mar 18, 1931, 84 y.o.   MRN: [id number]  HPI DM- chronic problem, on Metformin 500mg  daily.  On ACE for renal protection.  UTD on eye exam.  Due for foot exam.  No N/V/D.  No CP, SOB, HAs, visual changes, abd pain.  No numbness/tingling of hands/feet.   Review of Systems For ROS see HPI     Objective:   Physical Exam  Constitutional: He is oriented to person, place, and time. He appears well-developed and well-nourished. No distress.  HENT:  Head: Normocephalic and atraumatic.  Eyes: Pupils are equal, round, and reactive to light. Conjunctivae and EOM are normal.  Neck: Normal range of motion. Neck supple. No thyromegaly present.  Cardiovascular: Normal rate, normal heart sounds and intact distal pulses.  No murmur heard. Irregularly irregular S1/S2  Pulmonary/Chest: Effort normal and breath sounds normal. No respiratory distress.  Abdominal: Soft. Bowel sounds are normal. He exhibits no distension.  Musculoskeletal: He exhibits no edema.  Lymphadenopathy:    He has no cervical adenopathy.  Neurological: He is alert and oriented to person, place, and time. No cranial nerve deficit.  Skin: Skin is warm and dry.  Psychiatric: He has a normal mood and affect. His behavior is normal.  Vitals reviewed.         Assessment & Plan:

## 2017-10-01 ENCOUNTER — Encounter: Payer: Self-pay | Admitting: General Practice

## 2017-10-03 ENCOUNTER — Ambulatory Visit (INDEPENDENT_AMBULATORY_CARE_PROVIDER_SITE_OTHER): Payer: Medicare Other | Admitting: General Practice

## 2017-10-03 DIAGNOSIS — I4891 Unspecified atrial fibrillation: Secondary | ICD-10-CM

## 2017-10-03 DIAGNOSIS — Z7901 Long term (current) use of anticoagulants: Secondary | ICD-10-CM

## 2017-10-03 LAB — POCT INR: INR: 2.3 (ref 2.0–3.0)

## 2017-10-03 NOTE — Patient Instructions (Signed)
Pre visit review using our clinic review tool, if applicable. No additional management support is needed unless otherwise documented below in the visit note. ° °Continue to take 1 tablet daily except 1/2 tablet on Mondays.  Re-check in 4 weeks. ° °

## 2017-10-14 ENCOUNTER — Other Ambulatory Visit: Payer: Self-pay | Admitting: Family Medicine

## 2017-10-22 ENCOUNTER — Other Ambulatory Visit: Payer: Self-pay | Admitting: Family Medicine

## 2017-10-31 ENCOUNTER — Ambulatory Visit (INDEPENDENT_AMBULATORY_CARE_PROVIDER_SITE_OTHER): Payer: Medicare Other | Admitting: General Practice

## 2017-10-31 DIAGNOSIS — Z7901 Long term (current) use of anticoagulants: Secondary | ICD-10-CM | POA: Diagnosis not present

## 2017-10-31 LAB — POCT INR: INR: 2.2 (ref 2.0–3.0)

## 2017-10-31 NOTE — Progress Notes (Signed)
I have reviewed and agree with this plan  

## 2017-10-31 NOTE — Patient Instructions (Signed)
Pre visit review using our clinic review tool, if applicable. No additional management support is needed unless otherwise documented below in the visit note.  Continue to take 1 tablet daily except 1/2 tablet on Mondays.  Re-check in 6 weeks.  

## 2017-12-12 ENCOUNTER — Ambulatory Visit (INDEPENDENT_AMBULATORY_CARE_PROVIDER_SITE_OTHER): Payer: Medicare Other | Admitting: General Practice

## 2017-12-12 ENCOUNTER — Other Ambulatory Visit: Payer: Self-pay | Admitting: Family Medicine

## 2017-12-12 DIAGNOSIS — I4891 Unspecified atrial fibrillation: Secondary | ICD-10-CM

## 2017-12-12 DIAGNOSIS — Z7901 Long term (current) use of anticoagulants: Secondary | ICD-10-CM | POA: Diagnosis not present

## 2017-12-12 LAB — POCT INR: INR: 2.1 (ref 2.0–3.0)

## 2017-12-12 NOTE — Patient Instructions (Signed)
Pre visit review using our clinic review tool, if applicable. No additional management support is needed unless otherwise documented below in the visit note.  Continue to take 1 tablet daily except 1/2 tablet on Mondays.  Re-check in 6 weeks.  

## 2017-12-12 NOTE — Progress Notes (Signed)
I have reviewed and agree with this plan  

## 2018-01-07 ENCOUNTER — Ambulatory Visit: Payer: Medicare Other

## 2018-01-09 ENCOUNTER — Ambulatory Visit: Payer: Medicare Other

## 2018-01-21 ENCOUNTER — Other Ambulatory Visit: Payer: Self-pay | Admitting: Family Medicine

## 2018-01-30 ENCOUNTER — Ambulatory Visit (INDEPENDENT_AMBULATORY_CARE_PROVIDER_SITE_OTHER): Payer: Medicare Other | Admitting: General Practice

## 2018-01-30 DIAGNOSIS — Z7901 Long term (current) use of anticoagulants: Secondary | ICD-10-CM

## 2018-01-30 DIAGNOSIS — I4891 Unspecified atrial fibrillation: Secondary | ICD-10-CM

## 2018-01-30 LAB — POCT INR: INR: 2.4 (ref 2.0–3.0)

## 2018-01-30 NOTE — Patient Instructions (Addendum)
Pre visit review using our clinic review tool, if applicable. No additional management support is needed unless otherwise documented below in the visit note.  Continue to take 1 tablet daily except 1/2 tablet on Mondays.  Re-check in 6 weeks.  

## 2018-01-30 NOTE — Progress Notes (Signed)
I have reviewed and agree with this plan  

## 2018-02-07 ENCOUNTER — Telehealth: Payer: Self-pay

## 2018-02-07 NOTE — Telephone Encounter (Signed)
Office did not call pt. Should have been in regards to appt tomorrow.

## 2018-02-07 NOTE — Telephone Encounter (Signed)
Copied from [place] [id number]. Topic: General - Call Back - No Documentation >> Feb 07, 2018 10:45 AM Rutherford Nail, NT wrote: Reason for CRM: Patient returning call from office. No voicemail let for patient. Aware of appointment tomorrow 02/08/2018 at 3:30pm

## 2018-02-08 ENCOUNTER — Encounter: Payer: Medicare Other | Admitting: Family Medicine

## 2018-02-11 ENCOUNTER — Ambulatory Visit (INDEPENDENT_AMBULATORY_CARE_PROVIDER_SITE_OTHER): Payer: Medicare Other | Admitting: Family Medicine

## 2018-02-11 ENCOUNTER — Encounter: Payer: Self-pay | Admitting: Family Medicine

## 2018-02-11 ENCOUNTER — Other Ambulatory Visit: Payer: Self-pay

## 2018-02-11 VITALS — BP 124/66 | HR 60 | Temp 97.8°F | Resp 16 | Ht 70.0 in | Wt 239.0 lb

## 2018-02-11 DIAGNOSIS — L03011 Cellulitis of right finger: Secondary | ICD-10-CM | POA: Diagnosis not present

## 2018-02-11 MED ORDER — CEPHALEXIN 500 MG PO CAPS: 500.0000 mg | ORAL_CAPSULE | Freq: Two times a day (BID) | ORAL | 0 refills | Status: DC

## 2018-02-11 NOTE — Patient Instructions (Signed)
Please follow up if symptoms do not improve or as needed.   Start warm compresses twice a day and complete the antibiotics as prescribed. This should help resolve the infection.    Paronychia Paronychia is an infection of the skin. It happens near a fingernail or toenail. It may cause pain and swelling around the nail. In some cases, a fluid-filled bump (abscess) can form near or under the nail. Usually, this condition is not serious, and it clears up with treatment. Follow these instructions at home: Wound care  Keep the affected area clean.  Soak the fingers or toes in warm water as told by your doctor. You may be told to do this for 20 minutes, 2-3 times a day.  Keep the area dry when you are not soaking it.  Do not try to drain a fluid-filled bump on your own.  Follow instructions from your doctor about how to take care of the affected area. Make sure you: ? Wash your hands with soap and water before you change your bandage (dressing). If you cannot use soap and water, use hand sanitizer. ? Change your bandage as told by your doctor.  If you had a fluid-filled bump and your doctor drained it, check the area every day for signs of infection. Check for: ? Redness, swelling, or pain. ? Fluid or blood. ? Warmth. ? Pus or a bad smell. Medicines   Take over-the-counter and prescription medicines only as told by your doctor.  If you were prescribed an antibiotic medicine, take it as told by your doctor. Do not stop taking it even if you start to feel better. General instructions  Avoid touching any chemicals.  Do not pick at the affected area. Prevention  To prevent this condition from happening again: ? Wear rubber gloves when putting your hands in water for washing dishes or other tasks. ? Wear gloves if your hands might touch cleaners or chemicals. ? Avoid injuring your nails or fingertips. ? Do not bite your nails or tear hangnails. ? Do not cut your nails very  short. ? Do not cut the skin at the base and sides of the nail (cuticles). ? Use clean nail clippers or scissors when trimming nails. Contact a doctor if:  You feel worse.  You do not get better.  You have more fluid, blood, or pus coming from the affected area.  Your finger or knuckle is swollen or is hard to move. Get help right away if you have:  A fever or chills.  Redness spreading from the affected area.  Pain in a joint or muscle. Summary  Paronychia is an infection of the skin. It happens near a fingernail or toenail.  This condition may cause pain and swelling around the nail.  Soak the fingers or toes in warm water as told by your doctor.  Usually, this condition is not serious, and it clears up with treatment. This information is not intended to replace advice given to you by your health care provider. Make sure you discuss any questions you have with your health care provider. Document Released: 12/07/2008 Document Revised: 01/01/2017 Document Reviewed: 01/01/2017 Elsevier Interactive Patient Education  2019 [employer].

## 2018-02-11 NOTE — Progress Notes (Signed)
Subjective  CC:  Chief Complaint  Patient presents with  . Finger Injury    Happened in Jan, has been icing. He states that he closed the finger in his trunk. He reports discoloration and pus    HPI: [name] is a 85 y.o. male who presents to the office today to address the problems listed above in the chief complaint.  85 yo on coumadin presents to check finger. Describes a red swollen right index finger after slamming in trunk: used ice but then got infected. He used a sterilized needle to drain pus from it about 2 weeks ago. Doing much better now but still with small white pustule, nontender. No systemic sxs.    Assessment  1. Paronychia, finger, right      Plan   Paronychia, improved :  Warm compresses and antibiotics as ordered. Should resolve. No sign of bony injury  Follow up: Return if symptoms worsen or fail to improve.  03/01/2018  No orders of the defined types were placed in this encounter.  Meds ordered this encounter  Medications  . cephALEXin (KEFLEX) 500 MG capsule    Sig: Take 1 capsule (500 mg total) by mouth 2 (two) times daily.    Dispense:  14 capsule    Refill:  0      I reviewed the patients updated PMH, FH, and SocHx.    Patient Active Problem List   Diagnosis Date Noted  . Long term (current) use of anticoagulants 09/25/2016  . Rectal bleeding 01/21/2016  . Chronic constipation 04/29/2015  . Soft tissue mass 04/29/2015  . Chronic venous insufficiency   . Encounter for therapeutic drug monitoring 05/28/2014  . Essential hypertension 03/07/2014  . Preventative health care 03/07/2014  . Varicose veins of lower extremities with other complications 17/51/0258  . Hematoma of leg 09/09/2013  . Diabetes mellitus without complication ([place]) 52/77/8242  . Iron deficiency anemia due to chronic blood loss 08/26/2013  . Hyperlipidemia 08/13/2006  . GOUT 08/13/2006  . ATRIAL FIBRILLATION 08/13/2006   No outpatient medications have been marked  as taking for the 02/11/18 encounter (Office Visit) with [name], MD.    Allergies: Patient is allergic to influenza vac split [flu virus vaccine]. Family History: Patient family history includes Alcohol abuse in his father; Diabetes in his father; Obesity in his brother and daughter. Social History:  Patient  reports that he has quit smoking. His smoking use included cigarettes. He has never used smokeless tobacco. He reports that he does not drink alcohol or use drugs.  Review of Systems: Constitutional: Negative for fever malaise or anorexia Cardiovascular: negative for chest pain Respiratory: negative for SOB or persistent cough Gastrointestinal: negative for abdominal pain  Objective  Vitals: BP 124/66   Pulse 60   Temp 97.8 F (36.6 C) (Oral)   Resp 16   Ht 5\' 10"  (1.778 m)   Wt 239 lb (108.4 kg)   SpO2 98%   BMI 34.29 kg/m  General: no acute distress , A&Ox3 Right index finger: medial paronychia mild swelling and redness without warmth or significant fluctuance. No felon. nontender distal phalynx; normal appearing joint.    Commons side effects, risks, benefits, and alternatives for medications and treatment plan prescribed today were discussed, and the patient expressed understanding of the given instructions. Patient is instructed to call or message via MyChart if he/she has any questions or concerns regarding our treatment plan. No barriers to understanding were identified. We discussed Red Flag symptoms and  signs in detail. Patient expressed understanding regarding what to do in case of urgent or emergency type symptoms.   Medication list was reconciled, printed and provided to the patient in AVS. Patient instructions and summary information was reviewed with the patient as documented in the AVS. This note was prepared with assistance of Dragon voice recognition software. Occasional wrong-word or sound-a-like substitutions may have occurred due to the inherent  limitations of voice recognition software

## 2018-02-22 ENCOUNTER — Other Ambulatory Visit: Payer: Self-pay | Admitting: Family Medicine

## 2018-02-22 DIAGNOSIS — R609 Edema, unspecified: Secondary | ICD-10-CM

## 2018-02-28 ENCOUNTER — Ambulatory Visit (INDEPENDENT_AMBULATORY_CARE_PROVIDER_SITE_OTHER): Payer: Medicare Other | Admitting: Family Medicine

## 2018-02-28 ENCOUNTER — Other Ambulatory Visit: Payer: Self-pay

## 2018-02-28 ENCOUNTER — Encounter: Payer: Self-pay | Admitting: Family Medicine

## 2018-02-28 VITALS — BP 124/70 | HR 65 | Temp 98.0°F | Resp 16 | Ht 69.0 in | Wt 226.4 lb

## 2018-02-28 DIAGNOSIS — Z0000 Encounter for general adult medical examination without abnormal findings: Secondary | ICD-10-CM

## 2018-02-28 DIAGNOSIS — E538 Deficiency of other specified B group vitamins: Secondary | ICD-10-CM | POA: Insufficient documentation

## 2018-02-28 DIAGNOSIS — E119 Type 2 diabetes mellitus without complications: Secondary | ICD-10-CM | POA: Diagnosis not present

## 2018-02-28 DIAGNOSIS — R7989 Other specified abnormal findings of blood chemistry: Secondary | ICD-10-CM

## 2018-02-28 DIAGNOSIS — R972 Elevated prostate specific antigen [PSA]: Secondary | ICD-10-CM

## 2018-02-28 DIAGNOSIS — Z125 Encounter for screening for malignant neoplasm of prostate: Secondary | ICD-10-CM

## 2018-02-28 DIAGNOSIS — I4891 Unspecified atrial fibrillation: Secondary | ICD-10-CM | POA: Diagnosis not present

## 2018-02-28 DIAGNOSIS — E669 Obesity, unspecified: Secondary | ICD-10-CM

## 2018-02-28 LAB — LIPID PANEL
Cholesterol: 100 mg/dL (ref 0–200)
HDL: 39.6 mg/dL (ref >39.00)
LDL Cholesterol: 47 mg/dL (ref 0–99)
NonHDL: 60.31
Total CHOL/HDL Ratio: 3
Triglycerides: 66 mg/dL (ref 0.0–149.0)
VLDL: 13.2 mg/dL (ref 0.0–40.0)

## 2018-02-28 LAB — BASIC METABOLIC PANEL
BUN: 26 mg/dL — ABNORMAL HIGH (ref 6–23)
CO2: 27 mEq/L (ref 19–32)
Calcium: 8.9 mg/dL (ref 8.4–10.5)
Chloride: 106 mEq/L (ref 96–112)
Creatinine, Ser: 1.68 mg/dL — ABNORMAL HIGH (ref 0.40–1.50)
GFR: 39.03 mL/min — ABNORMAL LOW (ref >60.00)
Glucose, Bld: 84 mg/dL (ref 70–99)
POTASSIUM: 4.5 meq/L (ref 3.5–5.1)
Sodium: 141 mEq/L (ref 135–145)

## 2018-02-28 LAB — CBC WITH DIFFERENTIAL/PLATELET
Basophils Absolute: 0 10*3/uL (ref 0.0–0.1)
Basophils Relative: 0.7 % (ref 0.0–3.0)
Eosinophils Absolute: 0.1 10*3/uL (ref 0.0–0.7)
Eosinophils Relative: 2.3 % (ref 0.0–5.0)
HCT: 38 % — ABNORMAL LOW (ref 39.0–52.0)
Hemoglobin: 12.4 g/dL — ABNORMAL LOW (ref 13.0–17.0)
LYMPHS ABS: 1.1 10*3/uL (ref 0.7–4.0)
Lymphocytes Relative: 19.1 % (ref 12.0–46.0)
MCHC: 32.6 g/dL (ref 30.0–36.0)
MCV: 93.4 fl (ref 78.0–100.0)
Monocytes Absolute: 0.8 10*3/uL (ref 0.1–1.0)
Monocytes Relative: 14.2 % — ABNORMAL HIGH (ref 3.0–12.0)
NEUTROS PCT: 63.7 % (ref 43.0–77.0)
Neutro Abs: 3.7 10*3/uL (ref 1.4–7.7)
Platelets: 184 10*3/uL (ref 150.0–400.0)
RBC: 4.07 Mil/uL — ABNORMAL LOW (ref 4.22–5.81)
RDW: 15.9 % — ABNORMAL HIGH (ref 11.5–15.5)
WBC: 5.8 10*3/uL (ref 4.0–10.5)

## 2018-02-28 LAB — HEPATIC FUNCTION PANEL
ALT: 12 U/L (ref 0–53)
AST: 23 U/L (ref 0–37)
Albumin: 3.7 g/dL (ref 3.5–5.2)
Alkaline Phosphatase: 116 U/L (ref 39–117)
Bilirubin, Direct: 0.4 mg/dL — ABNORMAL HIGH (ref 0.0–0.3)
Total Bilirubin: 1.1 mg/dL (ref 0.2–1.2)
Total Protein: 6.3 g/dL (ref 6.0–8.3)

## 2018-02-28 LAB — PSA, MEDICARE: PSA: 5.9 ng/ml — ABNORMAL HIGH (ref 0.10–4.00)

## 2018-02-28 LAB — VITAMIN B12: VITAMIN B 12: 603 pg/mL (ref 211–911)

## 2018-02-28 LAB — HEMOGLOBIN A1C: Hgb A1c MFr Bld: 6 % (ref 4.6–6.5)

## 2018-02-28 LAB — TSH: TSH: 1.32 u[IU]/mL (ref 0.35–4.50)

## 2018-02-28 NOTE — Assessment & Plan Note (Signed)
Chronic problem.  Doing well on Metformin.  UTD on foot exam, eye exam, and on ACE for renal protection.  Applauded his 13 lb weight loss.  Check labs.  Adjust meds prn

## 2018-02-28 NOTE — Assessment & Plan Note (Signed)
Following w/ cardiology and Coumadin clinic

## 2018-02-28 NOTE — Assessment & Plan Note (Signed)
Ongoing issue for pt.  He is down 13 lbs since last visit.  Applauded his efforts.  Will continue to follow.

## 2018-02-28 NOTE — Assessment & Plan Note (Signed)
Pt's PE unchanged from previous and WNL w/ exception of Afib.  UTD on colonoscopy, immunizations.  Check labs.  Anticipatory guidance provided.

## 2018-02-28 NOTE — Assessment & Plan Note (Signed)
Pt has hx of this.  Would like to resume B12 injxns.  Check labs and arrange this at pt's convenience.

## 2018-02-28 NOTE — Patient Instructions (Signed)
Follow up in 3-4 months to recheck diabetes We'll notify you of your lab results and make any changes if needed Continue to work on healthy diet and regular exercise- you look great! Call with any questions or concerns Happy Early Birthday!!!

## 2018-02-28 NOTE — Progress Notes (Signed)
   Subjective:    Patient ID: [name], male    DOB: [birth year], 85 y.o.   MRN: [id number]  HPI CPE- UTD on colonoscopy, immunizations, foot exam, eye exam.  Pt is down 13 lbs since last visit.   Review of Systems Patient reports no vision/hearing changes, anorexia, fever ,adenopathy, persistant/recurrent hoarseness, swallowing issues, chest pain, palpitations, edema, persistant/recurrent cough, hemoptysis, dyspnea (rest,exertional, paroxysmal nocturnal), gastrointestinal  bleeding (melena, rectal bleeding), abdominal pain, excessive heart burn, GU symptoms (dysuria, hematuria, voiding/incontinence issues) syncope, focal weakness, memory loss, numbness & tingling, skin/hair/nail changes, depression, anxiety, abnormal bruising/bleeding, musculoskeletal symptoms/signs.     Objective:   Physical Exam General Appearance:    Alert, cooperative, no distress, appears stated age  Head:    Normocephalic, without obvious abnormality, atraumatic  Eyes:    PERRL, conjunctiva/corneas clear, EOM's intact, fundi    benign, both eyes       Ears:    Normal TM's and external ear canals, both ears  Nose:   Nares normal, septum midline, mucosa normal, no drainage   or sinus tenderness  Throat:   Lips, mucosa, and tongue normal; teeth and gums normal  Neck:   Supple, symmetrical, trachea midline, no adenopathy;       thyroid:  No enlargement/tenderness/nodules  Back:     Symmetric, no curvature, ROM normal, no CVA tenderness  Lungs:     Clear to auscultation bilaterally, respirations unlabored  Chest wall:    No tenderness or deformity  Heart:    Irregularly irregular, S1 and S2 normal, no murmur, rub   or gallop  Abdomen:     Soft, non-tender, bowel sounds active all four quadrants,    no masses, no organomegaly  Genitalia:    Normal male without lesion, masses,discharge or tenderness  Rectal:    Deferred due to young age  Extremities:   Extremities normal, atraumatic, no cyanosis or edema  Pulses:    2+ and symmetric all extremities  Skin:   Skin color, texture, turgor normal, no rashes or lesions  Lymph nodes:   Cervical, supraclavicular, and axillary nodes normal  Neurologic:   CNII-XII intact. Normal strength, sensation and reflexes      throughout          Assessment & Plan:

## 2018-03-01 ENCOUNTER — Ambulatory Visit: Payer: Medicare Other | Admitting: Family Medicine

## 2018-03-04 ENCOUNTER — Telehealth: Payer: Self-pay | Admitting: Family Medicine

## 2018-03-04 ENCOUNTER — Ambulatory Visit (INDEPENDENT_AMBULATORY_CARE_PROVIDER_SITE_OTHER): Payer: Medicare Other | Admitting: General Practice

## 2018-03-04 DIAGNOSIS — Z7901 Long term (current) use of anticoagulants: Secondary | ICD-10-CM

## 2018-03-04 DIAGNOSIS — I4891 Unspecified atrial fibrillation: Secondary | ICD-10-CM

## 2018-03-04 LAB — POCT INR: INR: 2.7 (ref 2.0–3.0)

## 2018-03-04 NOTE — Telephone Encounter (Signed)
Copied from [place] [phone]. Topic: Quick Communication - See Telephone Encounter >> Mar 04, 2018 11:07 AM Sheran Luz wrote: CRM for notification. See Telephone encounter for: 03/04/18.   Patient calling to check status of lab results from 2/27. Please advise.

## 2018-03-04 NOTE — Patient Instructions (Addendum)
Pre visit review using our clinic review tool, if applicable. No additional management support is needed unless otherwise documented below in the visit note.  Continue to take 1 tablet daily except 1/2 tablet on Mondays.  Re-check in 6 weeks.  

## 2018-03-04 NOTE — Telephone Encounter (Signed)
Patient is calling back for his lab results. Please advise.

## 2018-03-05 NOTE — Addendum Note (Signed)
Addended by: Doran Clay A on: 03/05/2018 10:25 AM   Modules accepted: Orders

## 2018-03-06 NOTE — Addendum Note (Signed)
Addended by: Doran Clay A on: 03/06/2018 10:23 AM   Modules accepted: Orders

## 2018-03-07 ENCOUNTER — Other Ambulatory Visit (INDEPENDENT_AMBULATORY_CARE_PROVIDER_SITE_OTHER): Payer: Medicare Other

## 2018-03-07 DIAGNOSIS — R7989 Other specified abnormal findings of blood chemistry: Secondary | ICD-10-CM

## 2018-03-07 LAB — BASIC METABOLIC PANEL
BUN: 28 mg/dL — ABNORMAL HIGH (ref 6–23)
CHLORIDE: 104 meq/L (ref 96–112)
CO2: 28 mEq/L (ref 19–32)
Calcium: 9.2 mg/dL (ref 8.4–10.5)
Creatinine, Ser: 1.54 mg/dL — ABNORMAL HIGH (ref 0.40–1.50)
GFR: 43.15 mL/min — ABNORMAL LOW (ref >60.00)
Glucose, Bld: 88 mg/dL (ref 70–99)
Potassium: 4.6 mEq/L (ref 3.5–5.1)
Sodium: 138 mEq/L (ref 135–145)

## 2018-03-13 ENCOUNTER — Ambulatory Visit: Payer: Medicare Other

## 2018-04-03 ENCOUNTER — Other Ambulatory Visit: Payer: Self-pay

## 2018-04-03 ENCOUNTER — Ambulatory Visit (INDEPENDENT_AMBULATORY_CARE_PROVIDER_SITE_OTHER): Payer: Medicare Other | Admitting: General Practice

## 2018-04-03 DIAGNOSIS — Z7901 Long term (current) use of anticoagulants: Secondary | ICD-10-CM | POA: Diagnosis not present

## 2018-04-03 LAB — POCT INR: INR: 2.9 (ref 2.0–3.0)

## 2018-04-03 NOTE — Patient Instructions (Incomplete)
Pre visit review using our clinic review tool, if applicable. No additional management support is needed unless otherwise documented below in the visit note. ° °Continue to take 1 tablet daily except 1/2 tablet on Mondays.  Re-check in 4 weeks. ° °

## 2018-04-03 NOTE — Progress Notes (Signed)
I have reviewed and agree with this plan  

## 2018-04-15 ENCOUNTER — Ambulatory Visit: Payer: Medicare Other

## 2018-04-19 ENCOUNTER — Other Ambulatory Visit: Payer: Self-pay | Admitting: Family Medicine

## 2018-04-20 ENCOUNTER — Other Ambulatory Visit: Payer: Self-pay | Admitting: Family Medicine

## 2018-05-01 ENCOUNTER — Other Ambulatory Visit: Payer: Self-pay

## 2018-05-01 ENCOUNTER — Ambulatory Visit (INDEPENDENT_AMBULATORY_CARE_PROVIDER_SITE_OTHER): Payer: Medicare Other | Admitting: General Practice

## 2018-05-01 DIAGNOSIS — I4891 Unspecified atrial fibrillation: Secondary | ICD-10-CM

## 2018-05-01 DIAGNOSIS — Z7901 Long term (current) use of anticoagulants: Secondary | ICD-10-CM | POA: Diagnosis not present

## 2018-05-01 LAB — POCT INR: INR: 2.8 (ref 2.0–3.0)

## 2018-05-01 NOTE — Patient Instructions (Signed)
Pre visit review using our clinic review tool, if applicable. No additional management support is needed unless otherwise documented below in the visit note. ° °Continue to take 1 tablet daily except 1/2 tablet on Mondays.  Re-check in 4 weeks. ° °

## 2018-05-01 NOTE — Progress Notes (Signed)
I have reviewed the results and agree with this plan   

## 2018-05-20 ENCOUNTER — Encounter: Payer: Self-pay | Admitting: Family Medicine

## 2018-05-20 ENCOUNTER — Other Ambulatory Visit: Payer: Self-pay

## 2018-05-20 ENCOUNTER — Telehealth: Payer: Self-pay | Admitting: Family Medicine

## 2018-05-20 ENCOUNTER — Ambulatory Visit (INDEPENDENT_AMBULATORY_CARE_PROVIDER_SITE_OTHER): Payer: Medicare Other | Admitting: Family Medicine

## 2018-05-20 VITALS — BP 121/81 | HR 75 | Temp 98.0°F | Wt 231.2 lb

## 2018-05-20 DIAGNOSIS — R0789 Other chest pain: Secondary | ICD-10-CM | POA: Diagnosis not present

## 2018-05-20 MED ORDER — HYDROCODONE-ACETAMINOPHEN 5-325 MG PO TABS: 1.0000 | ORAL_TABLET | ORAL | 0 refills | Status: DC | PRN

## 2018-05-20 NOTE — Telephone Encounter (Signed)
Ok to bring pt into office to assess as long as he passes screening questions and is masked

## 2018-05-20 NOTE — Patient Instructions (Signed)
Follow up as needed or as scheduled USE the Hydrocodone as needed for severe pain If not using the Hydrocodone, take tylenol as needed (but do not mix the 2) Heating pad for pain relief Topical pain meds like Aspercreme can help Call with any questions or concerns Hang in there!!!

## 2018-05-20 NOTE — Progress Notes (Signed)
   Subjective:    Patient ID: [name], male    DOB: 08/04/1931, 85 y.o.   MRN: [id number]  HPI Sternal pain- pt was attempting to push an oversized wheelchair down to the basement when he lost control of it and the chair handles hit him in the sternum.  This occurred 1 week ago.  'the pain is real bad'.  Attempting to get in and out of bed is the most painful.  Has been sleeping in the recliner.  Pain will radiate from anterior chest around to back when he attempts to be active.  Pain is sharp.  No dyspnea or pleuritic pain.  Taking Tylenol.  Took a leftover Oxycodone from wife and that was somewhat helpful.  Pt reports Tramadol 'doesn't do nothing'.     Review of Systems For ROS see HPI     Objective:   Physical Exam Vitals signs reviewed.  Constitutional:      General: He is not in acute distress.    Appearance: Normal appearance. He is obese. He is not ill-appearing.  Cardiovascular:     Rate and Rhythm: Normal rate.  Pulmonary:     Effort: Pulmonary effort is normal. No respiratory distress.     Breath sounds: Normal breath sounds. No stridor. No wheezing or rhonchi.  Chest:     Chest wall: Tenderness (TTP over lower 1/2 of sternum w/ faint bruising present.  no hematoma.  no obviously displaced ribs) present.  Skin:    General: Skin is warm and dry.     Findings: Bruising (faint bruising over lower sternum) present.  Neurological:     General: No focal deficit present.     Mental Status: He is alert and oriented to person, place, and time.           Assessment & Plan:  Sternal pain- new.  Due to trauma last week.  No obvious hematoma or fx.  No obviously displaced ribs.  Due to Coumadin unable to take NSAIDs.  Start Hydrocodone prn pain.  Reviewed supportive care and red flags that should prompt return.  Pt expressed understanding and is in agreement w/ plan.

## 2018-05-20 NOTE — Telephone Encounter (Signed)
Pt has been scheduled.  °

## 2018-05-20 NOTE — Telephone Encounter (Signed)
VV ok for this appt?

## 2018-05-20 NOTE — Telephone Encounter (Signed)
Please schedule

## 2018-05-20 NOTE — Telephone Encounter (Signed)
Pt states that he was moving a wheel chair to the basement of his home, it got away from him and handlebars hit him in his sternum x 1 week ago, pt states that this has caused him a great deal of pain traveling around to his back. Pt was wanting a office visit. Please advise how you would like this scheduled.

## 2018-05-29 ENCOUNTER — Ambulatory Visit: Payer: Medicare Other

## 2018-06-03 ENCOUNTER — Ambulatory Visit (INDEPENDENT_AMBULATORY_CARE_PROVIDER_SITE_OTHER): Payer: Medicare Other | Admitting: General Practice

## 2018-06-03 ENCOUNTER — Other Ambulatory Visit: Payer: Self-pay

## 2018-06-03 DIAGNOSIS — I4891 Unspecified atrial fibrillation: Secondary | ICD-10-CM

## 2018-06-03 DIAGNOSIS — Z7901 Long term (current) use of anticoagulants: Secondary | ICD-10-CM

## 2018-06-03 LAB — POCT INR: INR: 2.5 (ref 2.0–3.0)

## 2018-06-03 NOTE — Patient Instructions (Signed)
Pre visit review using our clinic review tool, if applicable. No additional management support is needed unless otherwise documented below in the visit note. ° °Continue to take 1 tablet daily except 1/2 tablet on Mondays.  Re-check in 4 weeks. ° °

## 2018-06-13 ENCOUNTER — Other Ambulatory Visit: Payer: Self-pay | Admitting: Family Medicine

## 2018-06-19 ENCOUNTER — Other Ambulatory Visit: Payer: Self-pay

## 2018-06-19 ENCOUNTER — Encounter: Payer: Self-pay | Admitting: Family Medicine

## 2018-06-19 ENCOUNTER — Ambulatory Visit (INDEPENDENT_AMBULATORY_CARE_PROVIDER_SITE_OTHER): Payer: Medicare Other | Admitting: Family Medicine

## 2018-06-19 DIAGNOSIS — E119 Type 2 diabetes mellitus without complications: Secondary | ICD-10-CM | POA: Diagnosis not present

## 2018-06-19 NOTE — Progress Notes (Signed)
I have discussed the procedure for the virtual visit with the patient who has given consent to proceed with assessment and treatment.   Pt unable to obtain vitals, has eye exam appt scheduled in the month or two.   Davis Gourd, CMA

## 2018-06-19 NOTE — Progress Notes (Signed)
Virtual Visit via Video   I connected with patient on 06/19/18 at  3:30 PM EDT by a video enabled telemedicine application and verified that I am speaking with the correct person using two identifiers.  Location patient: Home Location provider: Acup[facility], Office Persons participating in the virtual visit: Patient, Provider, [place] (Jess B)  I discussed the limitations of evaluation and management by telemedicine and the availability of in person appointments. The patient expressed understanding and agreed to proceed.  Interactive audio and video telecommunications were attempted between this provider and patient, however failed, due to patient having technical difficulties OR patient did not have access to video capability.  We continued and completed visit with audio only.   Subjective:   HPI:   DM- chronic problem, on Metformin 500mg  daily.  On ACE for renal protection.  UTD on foot exam.  Due for eye exam.  Pt reports energy level is good.  Denies CP, SOB, HAs, visual changes, edema.  Denies symptomatic lows, no numbness/tingling of hands/feet.  Doing yard work regularly.  ROS:   See pertinent positives and negatives per HPI.  Patient Active Problem List   Diagnosis Date Noted  . Obesity (BMI 30-39.9) 02/28/2018  . B12 deficiency 02/28/2018  . Long term (current) use of anticoagulants 09/25/2016  . Chronic constipation 04/29/2015  . Soft tissue mass 04/29/2015  . Chronic venous insufficiency   . Encounter for therapeutic drug monitoring 05/28/2014  . Essential hypertension 03/07/2014  . Preventative health care 03/07/2014  . Varicose veins of lower extremities with other complications 74/25/9563  . Diabetes mellitus without complication ([place]) 87/56/4332  . Iron deficiency anemia due to chronic blood loss 08/26/2013  . Hyperlipidemia 08/13/2006  . GOUT 08/13/2006  . ATRIAL FIBRILLATION 08/13/2006    Social History   Tobacco Use  . Smoking status: Former Smoker     Types: Cigarettes  . Smokeless tobacco: Never Used  Substance Use Topics  . Alcohol use: No    Current Outpatient Medications:  .  allopurinol (ZYLOPRIM) 300 MG tablet, TAKE 1 TABLET (300 MG TOTAL) BY MOUTH DAILY., Disp: 90 tablet, Rfl: 1 .  bisacodyl (DULCOLAX) 5 MG EC tablet, Take 2 tablets (10 mg total) by mouth daily as needed for moderate constipation. (Patient taking differently: Take 5 mg by mouth daily as needed for moderate constipation. Pt takes 1 tablet daily as needed), Disp: 30 tablet, Rfl: 0 .  Calcium Carbonate-Vit D-Min (CALCIUM 1200 PO), Take by mouth., Disp: , Rfl:  .  ferrous sulfate 325 (65 FE) MG tablet, Take 325 mg by mouth daily. , Disp: , Rfl:  .  furosemide (LASIX) 20 MG tablet, TAKE 1 TABLET BY MOUTH EVERY DAY, Disp: 90 tablet, Rfl: 1 .  HYDROcodone-acetaminophen (NORCO/VICODIN) 5-325 MG tablet, Take 1 tablet by mouth every 4 (four) hours as needed for moderate pain., Disp: 30 tablet, Rfl: 0 .  lisinopril (ZESTRIL) 2.5 MG tablet, TAKE 1 TABLET BY MOUTH EVERY DAY, Disp: 90 tablet, Rfl: 0 .  metFORMIN (GLUCOPHAGE) 500 MG tablet, TAKE 1 TABLET DAILY, Disp: 90 tablet, Rfl: 1 .  metoprolol succinate (TOPROL-XL) 50 MG 24 hr tablet, TAKE 1 TABLET (50 MG TOTAL) BY MOUTH DAILY., Disp: 90 tablet, Rfl: 1 .  simvastatin (ZOCOR) 20 MG tablet, TAKE 1 TABLET (20 MG TOTAL) BY MOUTH DAILY., Disp: 90 tablet, Rfl: 1 .  warfarin (COUMADIN) 5 MG tablet, TAKE AS DIRECTED BY ANTICOAGULATION CLINIC, Disp: 120 tablet, Rfl: 1  Allergies  Allergen Reactions  . Influenza Vac  Split [Flu Virus Vaccine] Nausea And Vomiting    Objective:   There were no vitals taken for this visit. Pt is able to speak clearly, coherently without shortness of breath or increased work of breathing.  Thought process is linear.  Mood is appropriate.   Assessment and Plan:   DM- chronic problem, on Metformin.  Currently asymptomatic.  Check labs.  No anticipated med changes.  Will follow   [name], MD 06/19/2018  Time spent with the patient: 8 minutes, of which >50% was spent in obtaining information about symptoms, reviewing previous labs, evaluations, and treatments, counseling about condition (please see the discussed topics above), and developing a plan to further investigate it; had a number of questions which I addressed.

## 2018-06-21 ENCOUNTER — Other Ambulatory Visit: Payer: Self-pay

## 2018-06-21 ENCOUNTER — Ambulatory Visit: Payer: Medicare Other

## 2018-06-21 DIAGNOSIS — E119 Type 2 diabetes mellitus without complications: Secondary | ICD-10-CM | POA: Diagnosis not present

## 2018-06-21 NOTE — Addendum Note (Signed)
Addended by: Doran Clay A on: 06/21/2018 02:35 PM   Modules accepted: Orders

## 2018-06-22 LAB — BASIC METABOLIC PANEL
BUN/Creatinine Ratio: 12 (calc) (ref 6–22)
BUN: 21 mg/dL (ref 7–25)
CO2: 26 mmol/L (ref 20–32)
Calcium: 9.1 mg/dL (ref 8.6–10.3)
Chloride: 105 mmol/L (ref 98–110)
Creat: 1.71 mg/dL — ABNORMAL HIGH (ref 0.70–1.11)
Glucose, Bld: 143 mg/dL — ABNORMAL HIGH (ref 65–99)
Potassium: 4.1 mmol/L (ref 3.5–5.3)
Sodium: 141 mmol/L (ref 135–146)

## 2018-06-22 LAB — HEMOGLOBIN A1C
Hgb A1c MFr Bld: 5.6 % of total Hgb (ref <5.7)
Mean Plasma Glucose: 114 (calc)
eAG (mmol/L): 6.3 (calc)

## 2018-06-24 ENCOUNTER — Other Ambulatory Visit: Payer: Self-pay | Admitting: Family Medicine

## 2018-06-24 ENCOUNTER — Telehealth: Payer: Self-pay | Admitting: *Deleted

## 2018-06-24 DIAGNOSIS — R7989 Other specified abnormal findings of blood chemistry: Secondary | ICD-10-CM

## 2018-06-24 DIAGNOSIS — R799 Abnormal finding of blood chemistry, unspecified: Secondary | ICD-10-CM

## 2018-06-24 NOTE — Telephone Encounter (Signed)
Talked with patient. Appointment has been made for tomorrow at 11:30

## 2018-06-24 NOTE — Telephone Encounter (Signed)
Patient called in about his foot that is red and swollen. He said he mentioned this in his virtual visit and it was thought that maybe it was resolve itself, but it has not.  He is asking what can be done for him to treat it (ice or warm epsom salt foot soak), and he's asking if he can either be seen in office or sent to someone that can help.   Pt is aware that I am routing to PCP and he will receive a call back.

## 2018-06-24 NOTE — Telephone Encounter (Signed)
Pt will need to have in-office visit for evaluation of foot

## 2018-06-25 ENCOUNTER — Ambulatory Visit (INDEPENDENT_AMBULATORY_CARE_PROVIDER_SITE_OTHER): Payer: Medicare Other | Admitting: Family Medicine

## 2018-06-25 ENCOUNTER — Other Ambulatory Visit: Payer: Self-pay

## 2018-06-25 ENCOUNTER — Encounter: Payer: Self-pay | Admitting: Family Medicine

## 2018-06-25 VITALS — BP 133/81 | HR 78 | Temp 97.2°F | Resp 16 | Ht 71.0 in | Wt 231.1 lb

## 2018-06-25 DIAGNOSIS — M79671 Pain in right foot: Secondary | ICD-10-CM | POA: Diagnosis not present

## 2018-06-25 NOTE — Progress Notes (Signed)
   Subjective:    Patient ID: NIKOLOS BILLIG, male    DOB: Mar 06, 1931, 85 y.o.   MRN: [id number]  HPI Foot pain/swelling- pt reports he was soaking his foot in [facility], then using an ice pack w/o results.  Then decided to wrap foot in ice pack- securing w/ a rubber band- and pain and swelling improved.  R foot was injured ~2 weeks ago when he got his foot caught putting on underwear and 'it bent back under'.   Review of Systems For ROS see HPI     Objective:   Physical Exam Vitals signs reviewed.  Constitutional:      General: He is not in acute distress.    Appearance: Normal appearance. He is obese. He is not ill-appearing.  Cardiovascular:     Pulses: Normal pulses.  Musculoskeletal:        General: Swelling (of dorsum of R foot) and tenderness (mild TTP over R foot at 2nd-4th MTP joints) present. No deformity.  Skin:    General: Skin is warm and dry.     Findings: Bruising (bruising across 2nd-4th MTP joints of R foot) present.  Neurological:     Mental Status: He is alert.           Assessment & Plan:  R foot pain- new to provider.  Occurred after inversion of 2nd-4th MTP joints.  Concern for fx.  Pt reports swelling is better today.  + bruising but pt is on blood thinners.  Will get xray to assess and place pt in post-op shoe for support and protection.  Pt expressed understanding and is in agreement w/ plan.

## 2018-06-25 NOTE — Patient Instructions (Signed)
Follow up as needed or as scheduled Go to the [facility] [place] to get your xray Wear the post-op shoe when up and about to protect your foot Continue to ice and elevate Call with any questions or concerns Stay Safe!!

## 2018-06-26 ENCOUNTER — Ambulatory Visit (INDEPENDENT_AMBULATORY_CARE_PROVIDER_SITE_OTHER): Payer: Medicare Other

## 2018-06-26 DIAGNOSIS — M7989 Other specified soft tissue disorders: Secondary | ICD-10-CM | POA: Diagnosis not present

## 2018-06-26 DIAGNOSIS — M79671 Pain in right foot: Secondary | ICD-10-CM

## 2018-06-26 DIAGNOSIS — Z96661 Presence of right artificial ankle joint: Secondary | ICD-10-CM | POA: Diagnosis not present

## 2018-06-27 ENCOUNTER — Telehealth: Payer: Self-pay | Admitting: Family Medicine

## 2018-06-27 NOTE — Telephone Encounter (Signed)
Pt calling in asking for the results from a foot xray he had done yesterday.

## 2018-06-27 NOTE — Telephone Encounter (Signed)
Results were just received. Will document on that encounter.

## 2018-07-01 ENCOUNTER — Ambulatory Visit: Payer: Medicare Other

## 2018-07-03 ENCOUNTER — Other Ambulatory Visit: Payer: Self-pay

## 2018-07-03 ENCOUNTER — Ambulatory Visit (INDEPENDENT_AMBULATORY_CARE_PROVIDER_SITE_OTHER): Payer: Medicare Other | Admitting: General Practice

## 2018-07-03 DIAGNOSIS — Z7901 Long term (current) use of anticoagulants: Secondary | ICD-10-CM | POA: Diagnosis not present

## 2018-07-03 DIAGNOSIS — I4891 Unspecified atrial fibrillation: Secondary | ICD-10-CM

## 2018-07-03 LAB — POCT INR: INR: 2.9 (ref 2.0–3.0)

## 2018-07-03 NOTE — Patient Instructions (Signed)
Pre visit review using our clinic review tool, if applicable. No additional management support is needed unless otherwise documented below in the visit note.  Continue to take 1 tablet daily except 1/2 tablet on Mondays.  Re-check in 6 weeks.  

## 2018-07-18 ENCOUNTER — Other Ambulatory Visit: Payer: Self-pay | Admitting: Family Medicine

## 2018-08-14 ENCOUNTER — Ambulatory Visit (INDEPENDENT_AMBULATORY_CARE_PROVIDER_SITE_OTHER): Payer: Medicare Other | Admitting: General Practice

## 2018-08-14 DIAGNOSIS — I4891 Unspecified atrial fibrillation: Secondary | ICD-10-CM

## 2018-08-14 DIAGNOSIS — Z7901 Long term (current) use of anticoagulants: Secondary | ICD-10-CM

## 2018-08-17 ENCOUNTER — Other Ambulatory Visit: Payer: Self-pay | Admitting: Family Medicine

## 2018-08-17 DIAGNOSIS — R609 Edema, unspecified: Secondary | ICD-10-CM

## 2018-08-17 DIAGNOSIS — R600 Localized edema: Secondary | ICD-10-CM

## 2018-08-21 ENCOUNTER — Ambulatory Visit (INDEPENDENT_AMBULATORY_CARE_PROVIDER_SITE_OTHER): Payer: Medicare Other | Admitting: General Practice

## 2018-08-21 DIAGNOSIS — I4891 Unspecified atrial fibrillation: Secondary | ICD-10-CM

## 2018-08-21 DIAGNOSIS — Z7901 Long term (current) use of anticoagulants: Secondary | ICD-10-CM | POA: Diagnosis not present

## 2018-08-21 LAB — POCT INR: INR: 2.5 (ref 2.0–3.0)

## 2018-08-21 NOTE — Progress Notes (Signed)
I have reviewed the results and agree with this plan   

## 2018-08-21 NOTE — Patient Instructions (Signed)
Pre visit review using our clinic review tool, if applicable. No additional management support is needed unless otherwise documented below in the visit note. ° °Continue to take 1 tablet daily except 1/2 tablet on Mondays.  Re-check in 4 weeks. ° °

## 2018-09-23 ENCOUNTER — Ambulatory Visit: Payer: Medicare Other

## 2018-09-30 ENCOUNTER — Ambulatory Visit: Payer: Medicare Other

## 2018-10-02 ENCOUNTER — Ambulatory Visit (INDEPENDENT_AMBULATORY_CARE_PROVIDER_SITE_OTHER): Payer: Medicare Other | Admitting: General Practice

## 2018-10-02 ENCOUNTER — Other Ambulatory Visit: Payer: Self-pay

## 2018-10-02 DIAGNOSIS — Z7901 Long term (current) use of anticoagulants: Secondary | ICD-10-CM

## 2018-10-02 DIAGNOSIS — I4891 Unspecified atrial fibrillation: Secondary | ICD-10-CM

## 2018-10-02 LAB — POCT INR: INR: 2.4 (ref 2.0–3.0)

## 2018-10-02 NOTE — Progress Notes (Signed)
I have reviewed the results and agree with this plan   

## 2018-10-02 NOTE — Patient Instructions (Addendum)
Pre visit review using our clinic review tool, if applicable. No additional management support is needed unless otherwise documented below in the visit note. ° °Continue to take 1 tablet daily except 1/2 tablet on Mondays.  Re-check in 4 weeks. ° °

## 2018-10-18 ENCOUNTER — Other Ambulatory Visit: Payer: Self-pay | Admitting: Family Medicine

## 2018-10-22 ENCOUNTER — Other Ambulatory Visit: Payer: Self-pay | Admitting: Family Medicine

## 2018-10-30 ENCOUNTER — Other Ambulatory Visit: Payer: Self-pay

## 2018-10-30 ENCOUNTER — Ambulatory Visit (INDEPENDENT_AMBULATORY_CARE_PROVIDER_SITE_OTHER): Payer: Medicare Other | Admitting: General Practice

## 2018-10-30 DIAGNOSIS — Z7901 Long term (current) use of anticoagulants: Secondary | ICD-10-CM

## 2018-10-30 LAB — POCT INR: INR: 2.8 (ref 2.0–3.0)

## 2018-10-30 NOTE — Progress Notes (Signed)
I have reviewed the results and agree with this plan   

## 2018-10-30 NOTE — Patient Instructions (Addendum)
Pre visit review using our clinic review tool, if applicable. No additional management support is needed unless otherwise documented below in the visit note.  Continue to take 1 tablet daily except 1/2 tablet on Mondays.  Re-check in 6 weeks.  

## 2018-10-31 ENCOUNTER — Encounter: Payer: Self-pay | Admitting: Family Medicine

## 2018-10-31 ENCOUNTER — Ambulatory Visit (INDEPENDENT_AMBULATORY_CARE_PROVIDER_SITE_OTHER): Payer: Medicare Other | Admitting: Family Medicine

## 2018-10-31 VITALS — BP 128/90 | HR 89 | Temp 97.9°F | Resp 17 | Wt 236.5 lb

## 2018-10-31 DIAGNOSIS — E119 Type 2 diabetes mellitus without complications: Secondary | ICD-10-CM | POA: Diagnosis not present

## 2018-10-31 DIAGNOSIS — I4811 Longstanding persistent atrial fibrillation: Secondary | ICD-10-CM

## 2018-10-31 DIAGNOSIS — R609 Edema, unspecified: Secondary | ICD-10-CM | POA: Diagnosis not present

## 2018-10-31 DIAGNOSIS — R600 Localized edema: Secondary | ICD-10-CM | POA: Diagnosis not present

## 2018-10-31 LAB — BASIC METABOLIC PANEL
BUN: 20 mg/dL (ref 6–23)
CO2: 29 mEq/L (ref 19–32)
Calcium: 8.9 mg/dL (ref 8.4–10.5)
Chloride: 105 mEq/L (ref 96–112)
Creatinine, Ser: 1.29 mg/dL (ref 0.40–1.50)
GFR: 52.86 mL/min — ABNORMAL LOW (ref >60.00)
Glucose, Bld: 99 mg/dL (ref 70–99)
Potassium: 4.2 mEq/L (ref 3.5–5.1)
Sodium: 139 mEq/L (ref 135–145)

## 2018-10-31 LAB — CBC WITH DIFFERENTIAL/PLATELET
Basophils Absolute: 0 10*3/uL (ref 0.0–0.1)
Basophils Relative: 0.9 % (ref 0.0–3.0)
Eosinophils Absolute: 0.2 10*3/uL (ref 0.0–0.7)
Eosinophils Relative: 3.4 % (ref 0.0–5.0)
HCT: 34.6 % — ABNORMAL LOW (ref 39.0–52.0)
Hemoglobin: 11.2 g/dL — ABNORMAL LOW (ref 13.0–17.0)
Lymphocytes Relative: 17.6 % (ref 12.0–46.0)
Lymphs Abs: 1 10*3/uL (ref 0.7–4.0)
MCHC: 32.4 g/dL (ref 30.0–36.0)
MCV: 94.4 fl (ref 78.0–100.0)
Monocytes Absolute: 0.8 10*3/uL (ref 0.1–1.0)
Monocytes Relative: 13.8 % — ABNORMAL HIGH (ref 3.0–12.0)
Neutro Abs: 3.6 10*3/uL (ref 1.4–7.7)
Neutrophils Relative %: 64.3 % (ref 43.0–77.0)
Platelets: 211 10*3/uL (ref 150.0–400.0)
RBC: 3.66 Mil/uL — ABNORMAL LOW (ref 4.22–5.81)
RDW: 16 % — ABNORMAL HIGH (ref 11.5–15.5)
WBC: 5.5 10*3/uL (ref 4.0–10.5)

## 2018-10-31 LAB — HEPATIC FUNCTION PANEL
ALT: 7 U/L (ref 0–53)
AST: 17 U/L (ref 0–37)
Albumin: 3.6 g/dL (ref 3.5–5.2)
Alkaline Phosphatase: 119 U/L — ABNORMAL HIGH (ref 39–117)
Bilirubin, Direct: 0.5 mg/dL — ABNORMAL HIGH (ref 0.0–0.3)
Total Bilirubin: 1.4 mg/dL — ABNORMAL HIGH (ref 0.2–1.2)
Total Protein: 6 g/dL (ref 6.0–8.3)

## 2018-10-31 LAB — HEMOGLOBIN A1C: Hgb A1c MFr Bld: 6 % (ref 4.6–6.5)

## 2018-10-31 LAB — LIPID PANEL
Cholesterol: 105 mg/dL (ref 0–200)
HDL: 42.9 mg/dL (ref >39.00)
LDL Cholesterol: 52 mg/dL (ref 0–99)
NonHDL: 62.59
Total CHOL/HDL Ratio: 2
Triglycerides: 55 mg/dL (ref 0.0–149.0)
VLDL: 11 mg/dL (ref 0.0–40.0)

## 2018-10-31 MED ORDER — FUROSEMIDE 20 MG PO TABS: ORAL_TABLET | ORAL | 1 refills | Status: DC

## 2018-10-31 NOTE — Assessment & Plan Note (Signed)
Pt has hx of good control.  Check labs.  Adjust meds prn.

## 2018-10-31 NOTE — Assessment & Plan Note (Signed)
Ongoing issue.  Rate controlled and on coumadin.  Pt follows at [facility] but no access to records.  Pt reports has not had an ECHO in 'years'.  Given his permanent Afib and worsening edema, must assess for CHF.  Will get ECHO.  Pt expressed understanding and is in agreement w/ plan.

## 2018-10-31 NOTE — Patient Instructions (Signed)
Follow up in 2 weeks to recheck swelling We'll notify you of your lab results and make any changes if needed Increase the Furosemide to 40mg  (2 pills) daily Elevate your legs when sitting Wear compression socks if you have them Call with any questions or concerns Hang in there!!

## 2018-10-31 NOTE — Progress Notes (Signed)
   Subjective:    Patient ID: [name], male    DOB: 05-Jan-1931, 85 y.o.   MRN: [id number]  HPI Edema- pt reports sxs have been ongoing for 'several months'.  Wife's doctor pointed out the swelling and encouraged him to seek care.  Today R side > L side.  Pt reports that typically L > R in the evening.  Denies pain but admits it's 'uncomfortable'.  Attempts to elevate legs.  No CP, SOB.  Unable to wear shoes due to degree of swelling.  Has tried ice, massage.  Not wearing compression socks b/c previously 'they did nothing for me'.  + hx of Afib.     Review of Systems For ROS see HPI     Objective:   Physical Exam Vitals signs reviewed.  Constitutional:      General: He is not in acute distress.    Appearance: He is well-developed. He is not ill-appearing.  HENT:     Head: Normocephalic and atraumatic.  Eyes:     Conjunctiva/sclera: Conjunctivae normal.     Pupils: Pupils are equal, round, and reactive to light.  Neck:     Musculoskeletal: Normal range of motion and neck supple.     Thyroid: No thyromegaly.  Cardiovascular:     Rate and Rhythm: Normal rate. Rhythm irregular.     Heart sounds: Murmur (II/VI LUSB) present.  Pulmonary:     Effort: Pulmonary effort is normal. No respiratory distress.     Breath sounds: Normal breath sounds.  Abdominal:     General: Bowel sounds are normal. There is no distension.     Palpations: Abdomen is soft.  Musculoskeletal:     Right lower leg: Edema (2+ pitting edema to mid shin) present.     Left lower leg: Edema (2+ pitting edema to midshin) present.  Lymphadenopathy:     Cervical: No cervical adenopathy.  Skin:    General: Skin is warm and dry.  Neurological:     Mental Status: He is alert and oriented to person, place, and time.     Cranial Nerves: No cranial nerve deficit.  Psychiatric:        Behavior: Behavior normal.           Assessment & Plan:  Edema- deteriorated.  Pt has always had a degree of swelling but over  the last few months this has worsened to the point of not being able to wear shoes.  Denies pain, SOB.  No skin breakdown.  Increase Lasix to 40mg  daily until swelling improves.  He will need to return in 2 weeks to assess and repeat BMP.  Also get ECHO and BNP to assess for CHF.  Encouraged elevation, low Na diet, compression socks.  Will follow closely.

## 2018-11-01 ENCOUNTER — Telehealth: Payer: Self-pay | Admitting: Family Medicine

## 2018-11-01 LAB — TSH: TSH: 1.78 u[IU]/mL (ref 0.35–4.50)

## 2018-11-01 LAB — BRAIN NATRIURETIC PEPTIDE: Brain Natriuretic Peptide: 324 pg/mL — ABNORMAL HIGH (ref <100)

## 2018-11-01 NOTE — Telephone Encounter (Signed)
He needs to get these either OTC or from a medical supply store.  We can send a DME order if he lets [place] know where he wants it to be sent

## 2018-11-01 NOTE — Telephone Encounter (Signed)
Please advise 

## 2018-11-01 NOTE — Telephone Encounter (Signed)
Pt informed and stated an understanding.  

## 2018-11-01 NOTE — Telephone Encounter (Signed)
Patient called and stated he thought Dr. Birdie Riddle was going to send in a order for him to get compression socks. When he went to the pharmacy they didn't having anything for him. Please advise on what patient should do.

## 2018-11-11 ENCOUNTER — Encounter (INDEPENDENT_AMBULATORY_CARE_PROVIDER_SITE_OTHER): Payer: Self-pay

## 2018-11-11 ENCOUNTER — Ambulatory Visit (HOSPITAL_COMMUNITY): Payer: Medicare Other | Attending: Internal Medicine

## 2018-11-11 ENCOUNTER — Other Ambulatory Visit: Payer: Self-pay

## 2018-11-11 DIAGNOSIS — I4811 Longstanding persistent atrial fibrillation: Secondary | ICD-10-CM | POA: Insufficient documentation

## 2018-11-11 DIAGNOSIS — R600 Localized edema: Secondary | ICD-10-CM | POA: Insufficient documentation

## 2018-11-12 NOTE — Progress Notes (Signed)
Called pt and lmovm to return call.    Ok for PEC to Discuss results / PCP recommendations / Schedule patient.  

## 2018-11-14 ENCOUNTER — Encounter: Payer: Self-pay | Admitting: Family Medicine

## 2018-11-14 ENCOUNTER — Other Ambulatory Visit: Payer: Self-pay

## 2018-11-14 ENCOUNTER — Ambulatory Visit (INDEPENDENT_AMBULATORY_CARE_PROVIDER_SITE_OTHER): Payer: Medicare Other | Admitting: Family Medicine

## 2018-11-14 VITALS — BP 123/74 | HR 69 | Temp 97.7°F | Resp 16 | Ht 71.0 in | Wt 229.1 lb

## 2018-11-14 DIAGNOSIS — I5189 Other ill-defined heart diseases: Secondary | ICD-10-CM | POA: Diagnosis not present

## 2018-11-14 DIAGNOSIS — R600 Localized edema: Secondary | ICD-10-CM

## 2018-11-14 LAB — BASIC METABOLIC PANEL
BUN: 29 mg/dL — ABNORMAL HIGH (ref 6–23)
CO2: 29 mEq/L (ref 19–32)
Calcium: 9.2 mg/dL (ref 8.4–10.5)
Chloride: 104 mEq/L (ref 96–112)
Creatinine, Ser: 1.59 mg/dL — ABNORMAL HIGH (ref 0.40–1.50)
GFR: 41.52 mL/min — ABNORMAL LOW (ref >60.00)
Glucose, Bld: 104 mg/dL — ABNORMAL HIGH (ref 70–99)
Potassium: 4 mEq/L (ref 3.5–5.1)
Sodium: 140 mEq/L (ref 135–145)

## 2018-11-14 NOTE — Assessment & Plan Note (Signed)
New dx since last visit based on ECHO results (severely dilated atria).  Suspect this contributed to his recent swelling.  Pt is now agreeable to a cardiology referral.  Referral placed.  He is already on ACE, beta blocker, and Lasix.  Will follow.

## 2018-11-14 NOTE — Progress Notes (Signed)
   Subjective:    Patient ID: [name], male    DOB: 1931-08-12, 85 y.o.   MRN: [id number]  HPI Diastolic dysfunction- pt showed severely dilated atria on ECHO.  Attempted cardiology referral but he wanted to discuss this w/ a friend of his before agreeing to referral.  BNP was elevated at 324.  Denies CP, SOB.  'I have tremendous energy'.  Is on ACE, beta blocker, and lasix.  LE edema- at last visit pt had increased LE edema to the point he could not wear shoes.  His Lasix was doubled to 40mg  daily.  He is down 8 lbs since last visit.  He reports swelling is 'tremendously better'.   Review of Systems For ROS see HPI     Objective:   Physical Exam Vitals signs reviewed.  Constitutional:      General: He is not in acute distress.    Appearance: He is well-developed. He is obese.  HENT:     Head: Normocephalic and atraumatic.  Eyes:     Conjunctiva/sclera: Conjunctivae normal.     Pupils: Pupils are equal, round, and reactive to light.  Neck:     Musculoskeletal: Normal range of motion and neck supple.     Thyroid: No thyromegaly.  Cardiovascular:     Rate and Rhythm: Normal rate and regular rhythm.     Heart sounds: Normal heart sounds. No murmur.  Pulmonary:     Effort: Pulmonary effort is normal. No respiratory distress.     Breath sounds: Normal breath sounds.  Abdominal:     General: Bowel sounds are normal. There is no distension.     Palpations: Abdomen is soft.  Musculoskeletal:     Right lower leg: Edema (trace LE edema) present.     Left lower leg: Edema (trace LE edema) present.  Lymphadenopathy:     Cervical: No cervical adenopathy.  Skin:    General: Skin is warm and dry.  Neurological:     Mental Status: He is alert and oriented to person, place, and time.     Cranial Nerves: No cranial nerve deficit.  Psychiatric:        Behavior: Behavior normal.           Assessment & Plan:  LE Edema- much improved since increasing Lasix to 40mg  daily.   Check BMP to assess Cr and K.  Will follow closely.

## 2018-11-14 NOTE — Patient Instructions (Signed)
Follow up as scheduled in December We'll notify you of your lab results and make any changes if needed Continue the Furosemide 40mg  daily (2 tabs) We'll call you with your Cardiology appt Call with any questions or concerns Stay Safe!  Stay Healthy! Happy Thanksgiving!

## 2018-11-22 ENCOUNTER — Other Ambulatory Visit: Payer: Self-pay

## 2018-11-22 DIAGNOSIS — Z20822 Contact with and (suspected) exposure to covid-19: Secondary | ICD-10-CM

## 2018-11-25 LAB — NOVEL CORONAVIRUS, NAA: SARS-CoV-2, NAA: NOT DETECTED

## 2018-12-06 ENCOUNTER — Telehealth: Payer: Self-pay | Admitting: *Deleted

## 2018-12-06 NOTE — Telephone Encounter (Signed)
Called and advised pt of PCP recommendation. He ensures me that he is going to [facility] the cardiology appt.

## 2018-12-06 NOTE — Telephone Encounter (Signed)
Patient called in and said that when he was in the office last Dr. Birdie Riddle told him that she did not think he needed to follow up with the Cardiologist.  According to the [place] note it appears as though she did, but even reading that to the patient he feels like it is incorrect.  He has an appointment in January with Dr. Tamala Julian and wants to know if he is clear to cancel this appointment.  I advised that he keep the follow-up scheduled, but he wanted me to send a note to PCP because he is under the impression that she wants him to cancel it.  Message sent to PCP

## 2018-12-06 NOTE — Telephone Encounter (Signed)
i'm the one who made the cardiology referral.  I want him to go

## 2018-12-10 ENCOUNTER — Other Ambulatory Visit: Payer: Self-pay

## 2018-12-11 ENCOUNTER — Ambulatory Visit (INDEPENDENT_AMBULATORY_CARE_PROVIDER_SITE_OTHER): Payer: Medicare Other | Admitting: General Practice

## 2018-12-11 DIAGNOSIS — Z7901 Long term (current) use of anticoagulants: Secondary | ICD-10-CM | POA: Diagnosis not present

## 2018-12-11 LAB — POCT INR: INR: 2.4 (ref 2.0–3.0)

## 2018-12-11 NOTE — Patient Instructions (Signed)
Pre visit review using our clinic review tool, if applicable. No additional management support is needed unless otherwise documented below in the visit note.  Continue to take 1 tablet daily except 1/2 tablet on Mondays.  Re-check in 6 weeks.  

## 2018-12-11 NOTE — Progress Notes (Signed)
I have reviewed the results and agree with this plan   

## 2019-01-07 ENCOUNTER — Ambulatory Visit: Payer: Medicare Other | Admitting: Interventional Cardiology

## 2019-01-17 ENCOUNTER — Other Ambulatory Visit: Payer: Self-pay | Admitting: Family Medicine

## 2019-01-21 ENCOUNTER — Other Ambulatory Visit: Payer: Self-pay

## 2019-01-22 ENCOUNTER — Ambulatory Visit (INDEPENDENT_AMBULATORY_CARE_PROVIDER_SITE_OTHER): Payer: Medicare Other | Admitting: General Practice

## 2019-01-22 ENCOUNTER — Ambulatory Visit (INDEPENDENT_AMBULATORY_CARE_PROVIDER_SITE_OTHER): Payer: Medicare Other | Admitting: Family Medicine

## 2019-01-22 ENCOUNTER — Encounter: Payer: Self-pay | Admitting: Family Medicine

## 2019-01-22 VITALS — BP 126/58 | HR 51 | Temp 97.3°F | Resp 16 | Wt 233.0 lb

## 2019-01-22 DIAGNOSIS — S8001XA Contusion of right knee, initial encounter: Secondary | ICD-10-CM

## 2019-01-22 DIAGNOSIS — Z7901 Long term (current) use of anticoagulants: Secondary | ICD-10-CM | POA: Diagnosis not present

## 2019-01-22 DIAGNOSIS — I4891 Unspecified atrial fibrillation: Secondary | ICD-10-CM

## 2019-01-22 DIAGNOSIS — W009XXA Unspecified fall due to ice and snow, initial encounter: Secondary | ICD-10-CM | POA: Diagnosis not present

## 2019-01-22 DIAGNOSIS — R600 Localized edema: Secondary | ICD-10-CM | POA: Diagnosis not present

## 2019-01-22 LAB — BASIC METABOLIC PANEL
BUN: 25 mg/dL — ABNORMAL HIGH (ref 6–23)
CO2: 28 mEq/L (ref 19–32)
Calcium: 9.2 mg/dL (ref 8.4–10.5)
Chloride: 107 mEq/L (ref 96–112)
Creatinine, Ser: 1.84 mg/dL — ABNORMAL HIGH (ref 0.40–1.50)
GFR: 35.07 mL/min — ABNORMAL LOW (ref >60.00)
Glucose, Bld: 108 mg/dL — ABNORMAL HIGH (ref 70–99)
Potassium: 4.4 mEq/L (ref 3.5–5.1)
Sodium: 141 mEq/L (ref 135–145)

## 2019-01-22 LAB — CBC WITH DIFFERENTIAL/PLATELET
Basophils Absolute: 0.1 K/uL (ref 0.0–0.1)
Basophils Relative: 1.1 % (ref 0.0–3.0)
Eosinophils Absolute: 0.2 K/uL (ref 0.0–0.7)
Eosinophils Relative: 3.3 % (ref 0.0–5.0)
HCT: 33.5 % — ABNORMAL LOW (ref 39.0–52.0)
Hemoglobin: 10.9 g/dL — ABNORMAL LOW (ref 13.0–17.0)
Lymphocytes Relative: 17.8 % (ref 12.0–46.0)
Lymphs Abs: 0.9 K/uL (ref 0.7–4.0)
MCHC: 32.5 g/dL (ref 30.0–36.0)
MCV: 94.6 fl (ref 78.0–100.0)
Monocytes Absolute: 0.7 K/uL (ref 0.1–1.0)
Monocytes Relative: 12.7 % — ABNORMAL HIGH (ref 3.0–12.0)
Neutro Abs: 3.4 K/uL (ref 1.4–7.7)
Neutrophils Relative %: 65.1 % (ref 43.0–77.0)
Platelets: 225 K/uL (ref 150.0–400.0)
RBC: 3.54 Mil/uL — ABNORMAL LOW (ref 4.22–5.81)
RDW: 16.8 % — ABNORMAL HIGH (ref 11.5–15.5)
WBC: 5.3 K/uL (ref 4.0–10.5)

## 2019-01-22 LAB — POCT INR: INR: 3 (ref 2.0–3.0)

## 2019-01-22 NOTE — Progress Notes (Signed)
   Subjective:    Patient ID: [name], male    DOB: 1931/03/29, 86 y.o.   MRN: [id number]  HPI Fall- 1 month ago pt has a plywood handicapped ramp for his wife and slipped on frost.  'my feet flew straight up and I landed on my back and R side'.  Initially had 'a lot of bruising and a lot of pain' of R thigh.  Used Aspercreme.  Pt took some Naproxen for a week afterwards and developed 'a deep deep dark' bruising of R medial thigh.  Stopped Naproxen a few weeks ago and the bruising has gradually improved.  Continues to have R knee pain and bruising.  Has R lower leg swelling.  Has INR check today at 2:15.   Review of Systems For ROS see HPI   This visit occurred during the SARS-CoV-2 public health emergency.  Safety protocols were in place, including screening questions prior to the visit, additional usage of staff PPE, and extensive cleaning of exam room while observing appropriate contact time as indicated for disinfecting solutions.       Objective:   Physical Exam Vitals reviewed.  Constitutional:      General: He is not in acute distress.    Appearance: He is obese. He is not ill-appearing.  HENT:     Head: Normocephalic and atraumatic.  Eyes:     Extraocular Movements: Extraocular movements intact.     Pupils: Pupils are equal, round, and reactive to light.  Musculoskeletal:        General: Swelling (trace R LE edema, no swelling of L LE) present.  Skin:    General: Skin is warm and dry.     Findings: Bruising (resolving hematoma of R distal thigh, bruising over R anterior knee) present.  Neurological:     General: No focal deficit present.     Mental Status: He is alert and oriented to person, place, and time.     Cranial Nerves: No cranial nerve deficit.     Motor: No weakness.     Coordination: Coordination normal.     Gait: Gait normal.  Psychiatric:        Mood and Affect: Mood normal.        Behavior: Behavior normal.        Thought Content: Thought content  normal.           Assessment & Plan:  Fall w/ resulting hematoma- new.  Fall occurred ~1 month ago.  Initially had bruising and swelling but situation acutely worsened after taking a week of Naproxen, which resulted in a R distal thigh hematoma.  Thankfully the area is resolving and pt has PT/INR check this afternoon.  Did not hit his head and has no neurologic symptoms.  Encouraged ice on R knee and heat on R distal thigh hematoma.  Reviewed supportive care and red flags that should prompt return.  Pt expressed understanding and is in agreement w/ plan.   R leg swelling- pt w/ hx of similar and sxs today are very mild in comparison to past episodes.  Encouraged continued use of compression socks and will check electrolytes to determine if med adjustments are needed for his lasix.  Pt expressed understanding and is in agreement w/ plan.

## 2019-01-22 NOTE — Progress Notes (Signed)
I have reviewed the results and agree with this plan   

## 2019-01-22 NOTE — Patient Instructions (Signed)
Pre visit review using our clinic review tool, if applicable. No additional management support is needed unless otherwise documented below in the visit note.  Take 1/2 tablet today and then continue to take 1 tablet daily except 1/2 tablet on Mondays.  Re-check in 6 weeks.

## 2019-01-22 NOTE — Patient Instructions (Signed)
Follow up next month to recheck diabetes We'll notify you of your lab results and make any changes if needed Get your INR checked this afternoon so we can make adjustments if needed Use a heating pad on the hematoma of the thigh and an ice pack on the knee Call with any questions or concerns Stay Safe!!!  Stay Healthy!

## 2019-01-27 NOTE — Progress Notes (Signed)
Cardiology Office Note:    Date:  01/28/2019   ID:  [name], DOB [birth year], MRN [id number]  PCP:  Midge Minium, MD  Cardiologist:  No primary care provider on file.   Referring MD: Midge Minium, MD   Chief Complaint  Patient presents with  . Advice Only    Biatrial enlargement/question diastolic dysfunction per Dr. Birdie Riddle    History of Present Illness:    [name] is a 86 y.o. male with a hx of  Atrial fibrillation referred for diastolic heart failure.  He has no complaints.  He is here to have cardiac evaluation after Dr. Birdie Riddle voiced complaints.  He denies angina, has not had syncope, no orthopnea, PND, edema, or other complaints.  Past Medical History:  Diagnosis Date  . Anemia   . Atrial fibrillation ([place])   . Blood transfusion without reported diagnosis   . Chronic constipation    colace helps  . Chronic renal insufficiency, stage II (mild)    CrCl @60 .  . Chronic venous insufficiency   . Diabetes mellitus without complication (HCC)    hx of good control  . Gout   . Hyperlipidemia   . Hypertension   . Osteopenia   . Stercoral ulcer of rectum   . Varicose veins of both lower extremities with complications   . Vitamin B12 deficiency     Past Surgical History:  Procedure Laterality Date  . BUNIONECTOMY  2006  . COLONOSCOPY N/A 01/22/2016   Procedure: COLONOSCOPY;  Surgeon: Manus Gunning, MD;  Location: [facility];  Service: Gastroenterology;  Laterality: N/A;  . [place]  . TONSILLECTOMY AND ADENOIDECTOMY  1945    Current Medications: Current Meds  Medication Sig  . allopurinol (ZYLOPRIM) 300 MG tablet TAKE 1 TABLET (300 MG TOTAL) BY MOUTH DAILY.  . bisacodyl (DULCOLAX) 5 MG EC tablet Take 5 mg by mouth daily as needed for moderate constipation.  . Calcium Carbonate-Vit D-Min (CALCIUM 1200 PO) Take by mouth.  . ferrous sulfate 325 (65 FE) MG tablet Take 325 mg by mouth daily.   . furosemide (LASIX) 20  MG tablet Take 1 to 2 tabs daily for swelling  . HYDROcodone-acetaminophen (NORCO/VICODIN) 5-325 MG tablet Take 1 tablet by mouth every 4 (four) hours as needed for moderate pain.  Marland Kitchen lisinopril (ZESTRIL) 2.5 MG tablet TAKE 1 TABLET BY MOUTH EVERY DAY  . metFORMIN (GLUCOPHAGE) 500 MG tablet TAKE 1 TABLET BY MOUTH EVERY DAY  . metoprolol succinate (TOPROL-XL) 50 MG 24 hr tablet TAKE 1 TABLET BY MOUTH EVERY DAY  . simvastatin (ZOCOR) 20 MG tablet TAKE 1 TABLET BY MOUTH EVERY DAY  . warfarin (COUMADIN) 5 MG tablet TAKE AS DIRECTED BY ANTICOAGULATION CLINIC     Allergies:   Influenza vac split [flu virus vaccine]   Social History   Socioeconomic History  . Marital status: Married    Spouse name: Not on file  . Number of children: 3  . Years of education: Not on file  . Highest education level: Not on file  Occupational History  . Occupation: retired  Tobacco Use  . Smoking status: Former Smoker    Types: Cigarettes  . Smokeless tobacco: Never Used  Substance and Sexual Activity  . Alcohol use: No  . Drug use: No  . Sexual activity: Not on file  Other Topics Concern  . Not on file  Social History Narrative   Married, 3 daughters, 4 grandchildren.  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   Retired 2012.      Remote smoking hx: quit 55 yrs ago.  No alcohol in 55 yrs.      Social Determinants of Health   Financial Resource Strain:   . Difficulty of Paying Living Expenses: Not on file  Food Insecurity:   . Worried About Charity fundraiser in the Last Year: Not on file  . Ran Out of Food in the Last Year: Not on file  Transportation Needs:   . Lack of Transportation (Medical): Not on file  . Lack of Transportation (Non-Medical): Not on file  Physical Activity:   . Days of Exercise per Week: Not on file  . Minutes of Exercise per Session: Not on file  Stress:   . Feeling of Stress : Not on file  Social  Connections:   . Frequency of Communication with Friends and Family: Not on file  . Frequency of Social Gatherings with Friends and Family: Not on file  . Attends Religious Services: Not on file  . Active Member of Clubs or Organizations: Not on file  . Attends Archivist Meetings: Not on file  . Marital Status: Not on file     Family History: The patient's family history includes Alcohol abuse in his father; Diabetes in his father; Obesity in his brother and daughter.  ROS:   Please see the history of present illness.    Spends most of his time taking care of his wife.  She has multiple medical needs including difficulty with mentation, arthritis, and requires physical help.  All other systems reviewed and are negative.  EKGs/Labs/Other Studies Reviewed:    The following studies were reviewed today:   2D Doppler echocardiogram 11/11/2018: IMPRESSIONS    1. Left ventricular ejection fraction, by visual estimation, is 60 to 65%. The left ventricle has normal function. There is mildly increased left ventricular hypertrophy.  2. Global right ventricle has normal systolic function.The right ventricular size is normal. No increase in right ventricular wall thickness.  3. Left atrial size was severely dilated.  4. Right atrial size was mildly dilated.  5. Mild mitral annular calcification.  6. The mitral valve is abnormal. Mild mitral valve regurgitation.  7. The tricuspid valve is normal in structure. Tricuspid valve regurgitation is mild.  8. Aortic valve regurgitation is moderate.  9. The aortic valve is tricuspid. Aortic valve regurgitation is moderate. Mild aortic valve sclerosis without stenosis. 10. The pulmonic valve was normal in structure. Pulmonic valve regurgitation is not visualized. 11. Mildly elevated pulmonary artery systolic pressure.   EKG:  EKG atrial fibrillation with controlled ventricular response, 69 bpm.  Poor R wave progression V1 through V4.   Vertical axis.  No prior tracing is noted.  Recent Labs: 10/31/2018: ALT 7; Brain Natriuretic Peptide 324; TSH 1.78 01/22/2019: BUN 25; Creatinine, Ser 1.84; Hemoglobin 10.9; Platelets 225.0; Potassium 4.4; Sodium 141  Recent Lipid Panel    Component Value Date/Time   CHOL 105 10/31/2018 0953   TRIG 55.0 10/31/2018 0953   HDL 42.90 10/31/2018 0953   CHOLHDL 2 10/31/2018 0953   VLDL 11.0 10/31/2018 0953   LDLCALC 52 10/31/2018 0953    Physical Exam:    VS:  BP 138/74   Pulse 69   Ht 5\' 11"  (1.803 m)   Wt 232 lb (105.2 kg)   SpO2 97%   BMI 32.36 kg/m     Wt  Readings from Last 3 Encounters:  01/28/19 232 lb (105.2 kg)  01/22/19 233 lb (105.7 kg)  11/14/18 229 lb 2 oz (103.9 kg)     GEN: Compatible with age. No acute distress HEENT: Normal NECK: No JVD. LYMPHATICS: No lymphadenopathy CARDIAC: Irregularly irregular rhythm RR.  There is a 2/6 systolic murmur.  I do not hear a diastolic murmur.  No  gallop, or edema. VASCULAR:  Normal Pulses. No bruits. RESPIRATORY:  Clear to auscultation without rales, wheezing or rhonchi  ABDOMEN: Soft, non-tender, non-distended, No pulsatile mass, MUSCULOSKELETAL: No deformity  SKIN: Warm and dry NEUROLOGIC:  Alert and oriented x 3 PSYCHIATRIC:  Normal affect   ASSESSMENT:    1. Diastolic dysfunction   2. Atrial fibrillation, unspecified type ([place])   3. Mixed hyperlipidemia   4. Essential hypertension   5. Diabetes mellitus without complication ([place])   6. Long term (current) use of anticoagulants   7. Educated about COVID-19 virus infection    PLAN:    In order of problems listed above:  1. I do believe there may be diastolic dysfunction slowly based upon the patient's age but in atrial fibrillation diastolic parameters could not be assessed.  He has a gigantic left atrium and large right atrium.  This could be partially related to diastolic dysfunction but there is also a component due to atrial remodeling due to chronic atrial  fibrillation. 2. Rate is well controlled and the paper chart is on anticoagulation with Coumadin.  The [place] Vascular score is greater than 2. 3. LDL was excellent and late 2020 4. We did not address diabetes 5. Coumadin therapy without bleeding complications 6. 3W' S and COVID-19 vaccination are endorsed by the patient   Medication Adjustments/Labs and Tests Ordered: Current medicines are reviewed at length with the patient today.  Concerns regarding medicines are outlined above.  Orders Placed This Encounter  Procedures  . EKG 12-Lead   No orders of the defined types were placed in this encounter.   Patient Instructions  Medication Instructions:  Your physician recommends that you continue on your current medications as directed. Please refer to the Current Medication list given to you today.  *If you need a refill on your cardiac medications before your next appointment, please call your pharmacy*  Lab Work: None If you have labs (blood work) drawn today and your tests are completely normal, you will receive your results only by: Marland Kitchen MyChart Message (if you have MyChart) OR . A paper copy in the mail If you have any lab test that is abnormal or we need to change your treatment, we will call you to review the results.  Testing/Procedures: None  Follow-Up: At [facility], you and your health needs are our priority.  As part of our continuing mission to provide you with exceptional heart care, we have created designated Provider Care Teams.  These Care Teams include your primary Cardiologist (physician) and Advanced Practice Providers (APPs -  Physician Assistants and Nurse Practitioners) who all work together to provide you with the care you need, when you need it.  Your next appointment:   As needed   The format for your next appointment:   In Person  Provider:   You may see Dr. Daneen Schick or one of the following Advanced Practice Providers on your designated Care Team:     Truitt Merle, NP  Cecilie Kicks, NP  Kathyrn Drown, NP   Other Instructions      Signed, Belva Crome III,  MD  01/28/2019 2:59 PM    [place]

## 2019-01-28 ENCOUNTER — Other Ambulatory Visit: Payer: Self-pay | Admitting: Family Medicine

## 2019-01-28 ENCOUNTER — Other Ambulatory Visit: Payer: Self-pay

## 2019-01-28 ENCOUNTER — Ambulatory Visit: Payer: Medicare Other | Admitting: Interventional Cardiology

## 2019-01-28 ENCOUNTER — Encounter: Payer: Self-pay | Admitting: Interventional Cardiology

## 2019-01-28 VITALS — BP 138/74 | HR 69 | Ht 71.0 in | Wt 232.0 lb

## 2019-01-28 DIAGNOSIS — E782 Mixed hyperlipidemia: Secondary | ICD-10-CM

## 2019-01-28 DIAGNOSIS — Z7901 Long term (current) use of anticoagulants: Secondary | ICD-10-CM

## 2019-01-28 DIAGNOSIS — Z7189 Other specified counseling: Secondary | ICD-10-CM

## 2019-01-28 DIAGNOSIS — I5189 Other ill-defined heart diseases: Secondary | ICD-10-CM | POA: Diagnosis not present

## 2019-01-28 DIAGNOSIS — I4891 Unspecified atrial fibrillation: Secondary | ICD-10-CM | POA: Diagnosis not present

## 2019-01-28 DIAGNOSIS — I10 Essential (primary) hypertension: Secondary | ICD-10-CM | POA: Diagnosis not present

## 2019-01-28 DIAGNOSIS — E119 Type 2 diabetes mellitus without complications: Secondary | ICD-10-CM

## 2019-01-28 NOTE — Patient Instructions (Signed)
Medication Instructions:  Your physician recommends that you continue on your current medications as directed. Please refer to the Current Medication list given to you today.  *If you need a refill on your cardiac medications before your next appointment, please call your pharmacy*  Lab Work: None If you have labs (blood work) drawn today and your tests are completely normal, you will receive your results only by: . MyChart Message (if you have MyChart) OR . A paper copy in the mail If you have any lab test that is abnormal or we need to change your treatment, we will call you to review the results.  Testing/Procedures: None  Follow-Up: At [facility], you and your health needs are our priority.  As part of our continuing mission to provide you with exceptional heart care, we have created designated Provider Care Teams.  These Care Teams include your primary Cardiologist (physician) and Advanced Practice Providers (APPs -  Physician Assistants and Nurse Practitioners) who all work together to provide you with the care you need, when you need it.  Your next appointment:   As needed   The format for your next appointment:   In Person  Provider:   You may see Dr. Henry Laflam or one of the following Advanced Practice Providers on your designated Care Team:    Lori Gerhardt, NP  Laura Ingold, NP  Jill McDaniel, NP   Other Instructions   

## 2019-02-19 ENCOUNTER — Other Ambulatory Visit: Payer: Self-pay | Admitting: Family Medicine

## 2019-02-24 ENCOUNTER — Other Ambulatory Visit: Payer: Self-pay

## 2019-02-24 ENCOUNTER — Encounter: Payer: Self-pay | Admitting: Family Medicine

## 2019-02-24 ENCOUNTER — Ambulatory Visit (INDEPENDENT_AMBULATORY_CARE_PROVIDER_SITE_OTHER): Payer: Medicare HMO | Admitting: Family Medicine

## 2019-02-24 VITALS — BP 130/76 | HR 78 | Temp 97.9°F | Resp 16 | Ht 71.0 in | Wt 224.5 lb

## 2019-02-24 DIAGNOSIS — E669 Obesity, unspecified: Secondary | ICD-10-CM | POA: Diagnosis not present

## 2019-02-24 DIAGNOSIS — E119 Type 2 diabetes mellitus without complications: Secondary | ICD-10-CM

## 2019-02-24 NOTE — Patient Instructions (Addendum)
Schedule your complete physical in 3-4 months We'll notify you of your lab results and make any changes if needed Continue to work on healthy diet and exercise as you are able- you're doing great! Call and schedule your eye exam- please have them send me a copy Call with any questions or concerns Stay Safe!  Stay Healthy!

## 2019-02-24 NOTE — Assessment & Plan Note (Signed)
Ongoing issue for pt.  Down 8 lbs since his swelling has improved.  Encouraged healthy diet and exercise when able.  Will follow.

## 2019-02-24 NOTE — Progress Notes (Signed)
   Subjective:    Patient ID: [name], male    DOB: December 21, 1931, 86 y.o.   MRN: [id number]  HPI DM- chronic problem, on Metformin 500mg  BID.  On ACE for renal protection.  Due for foot exam, eye exam (pt to call and schedule).  No CP, SOB, HAs, visual changes, abd pain, N/V.  Denies symptomatic lows.  No numbness/tingling of hands/feet.  No sores/blisters/wounds on feet.  Obesity- pt is down 8 lbs since last visit.  Pt feels that this is b/c his swelling is less on the Lasix.   Review of Systems For ROS see HPI   This visit occurred during the SARS-CoV-2 public health emergency.  Safety protocols were in place, including screening questions prior to the visit, additional usage of staff PPE, and extensive cleaning of exam room while observing appropriate contact time as indicated for disinfecting solutions.       Objective:   Physical Exam Vitals reviewed.  Constitutional:      General: He is not in acute distress.    Appearance: He is well-developed. He is obese.  HENT:     Head: Normocephalic and atraumatic.  Eyes:     Conjunctiva/sclera: Conjunctivae normal.     Pupils: Pupils are equal, round, and reactive to light.  Neck:     Thyroid: No thyromegaly.  Cardiovascular:     Rate and Rhythm: Normal rate and regular rhythm.     Heart sounds: Normal heart sounds. No murmur.  Pulmonary:     Effort: Pulmonary effort is normal. No respiratory distress.     Breath sounds: Normal breath sounds.  Abdominal:     General: Bowel sounds are normal. There is no distension.     Palpations: Abdomen is soft.  Musculoskeletal:     Cervical back: Normal range of motion and neck supple.  Lymphadenopathy:     Cervical: No cervical adenopathy.  Skin:    General: Skin is warm and dry.  Neurological:     Mental Status: He is alert and oriented to person, place, and time.     Cranial Nerves: No cranial nerve deficit.  Psychiatric:        Behavior: Behavior normal.            Assessment & Plan:

## 2019-02-24 NOTE — Assessment & Plan Note (Signed)
Chronic problem.  On ACE for renal protection.  Foot exam done today.  Due for eye exam- pt to schedule.  Check labs.  Adjust meds prn.

## 2019-02-25 LAB — BASIC METABOLIC PANEL
BUN: 23 mg/dL (ref 6–23)
CO2: 29 mEq/L (ref 19–32)
Calcium: 9.3 mg/dL (ref 8.4–10.5)
Chloride: 103 mEq/L (ref 96–112)
Creatinine, Ser: 2.05 mg/dL — ABNORMAL HIGH (ref 0.40–1.50)
GFR: 30.95 mL/min — ABNORMAL LOW (ref >60.00)
Glucose, Bld: 125 mg/dL — ABNORMAL HIGH (ref 70–99)
Potassium: 4.4 mEq/L (ref 3.5–5.1)
Sodium: 139 mEq/L (ref 135–145)

## 2019-02-25 LAB — HEMOGLOBIN A1C: Hgb A1c MFr Bld: 6 % (ref 4.6–6.5)

## 2019-02-27 ENCOUNTER — Other Ambulatory Visit: Payer: Self-pay

## 2019-02-27 DIAGNOSIS — N289 Disorder of kidney and ureter, unspecified: Secondary | ICD-10-CM

## 2019-03-05 ENCOUNTER — Ambulatory Visit (INDEPENDENT_AMBULATORY_CARE_PROVIDER_SITE_OTHER): Payer: Medicare HMO

## 2019-03-05 ENCOUNTER — Other Ambulatory Visit: Payer: Self-pay

## 2019-03-05 ENCOUNTER — Ambulatory Visit: Payer: Medicare Other

## 2019-03-05 DIAGNOSIS — N289 Disorder of kidney and ureter, unspecified: Secondary | ICD-10-CM

## 2019-03-05 LAB — BASIC METABOLIC PANEL
BUN: 28 mg/dL — ABNORMAL HIGH (ref 6–23)
CO2: 27 mEq/L (ref 19–32)
Calcium: 9.5 mg/dL (ref 8.4–10.5)
Chloride: 101 mEq/L (ref 96–112)
Creatinine, Ser: 1.79 mg/dL — ABNORMAL HIGH (ref 0.40–1.50)
GFR: 36.19 mL/min — ABNORMAL LOW (ref >60.00)
Glucose, Bld: 92 mg/dL (ref 70–99)
Potassium: 4.5 mEq/L (ref 3.5–5.1)
Sodium: 136 mEq/L (ref 135–145)

## 2019-03-06 ENCOUNTER — Encounter: Payer: Self-pay | Admitting: General Practice

## 2019-03-11 ENCOUNTER — Other Ambulatory Visit: Payer: Self-pay

## 2019-03-12 ENCOUNTER — Ambulatory Visit (INDEPENDENT_AMBULATORY_CARE_PROVIDER_SITE_OTHER): Payer: Medicare HMO | Admitting: General Practice

## 2019-03-12 ENCOUNTER — Telehealth: Payer: Self-pay | Admitting: Family Medicine

## 2019-03-12 DIAGNOSIS — Z7901 Long term (current) use of anticoagulants: Secondary | ICD-10-CM | POA: Diagnosis not present

## 2019-03-12 DIAGNOSIS — I4891 Unspecified atrial fibrillation: Secondary | ICD-10-CM

## 2019-03-12 LAB — POCT INR: INR: 2.1 (ref 2.0–3.0)

## 2019-03-12 NOTE — Telephone Encounter (Signed)
Pt called in asking for an in office visit to check on wound on his arm that is not healing, Dr. Birdie Riddle is booked until next week. Can we do an in office visit with you?

## 2019-03-12 NOTE — Progress Notes (Signed)
I have reviewed the results and agree with this plan   

## 2019-03-12 NOTE — Patient Instructions (Signed)
Pre visit review using our clinic review tool, if applicable. No additional management support is needed unless otherwise documented below in the visit note.  Continue to take 1 tablet daily except 1/2 tablet on Mondays.  Re-check in 6 weeks.  

## 2019-03-12 NOTE — Telephone Encounter (Signed)
Pt is scheduled °

## 2019-03-12 NOTE — Telephone Encounter (Signed)
That is fine if he passes screening.

## 2019-03-14 ENCOUNTER — Encounter: Payer: Self-pay | Admitting: Physician Assistant

## 2019-03-14 ENCOUNTER — Ambulatory Visit (INDEPENDENT_AMBULATORY_CARE_PROVIDER_SITE_OTHER): Payer: Medicare HMO | Admitting: Physician Assistant

## 2019-03-14 ENCOUNTER — Other Ambulatory Visit: Payer: Self-pay

## 2019-03-14 VITALS — BP 128/60 | HR 65 | Temp 98.1°F | Resp 16 | Ht 71.0 in | Wt 220.0 lb

## 2019-03-14 DIAGNOSIS — S51801A Unspecified open wound of right forearm, initial encounter: Secondary | ICD-10-CM | POA: Diagnosis not present

## 2019-03-14 NOTE — Patient Instructions (Addendum)
Please keep well-hydrated. Keep up with chronic medications and coumadin checks.  Keep skin clean and dry -- warm, clean water and soap. Pat completely dry. Avoid use of any neosporin/bacitracin for now as it is keeping wound too moist and delaying wound healing.   Use the supplies given to keep covered. We will watch it over the next week. Follow-up with me in 7-10 days for reassessment to make sure it continues healing.  If you note new redness, tenderness/pain, heat or pus drainage, let us know ASAP as these are signs of infection.  Hang in there!

## 2019-03-14 NOTE — Progress Notes (Signed)
Patient presents to clinic today c/o nonhealing wound of his right forearm.  Patient endorses 2 weeks ago he slipped at home the floor and scratched his right forearm on the door, tarry skin.  Noted a good amount of bleeding at the time that he was able to stop, cleaning the wound and bandaging. (Patient on Coumadin therapy).  Patient states he has been keeping clean and dry, applying a liberal layer of Neosporin over the area and placing an occlusive dressing over this daily.  States the wound just seems to be persistent.  Denies any increased pain, any redness, warmth, tenderness or drainage from the area.  Denies fever, chills, malaise or fatigue.  Tetanus is up-to-date.  Past Medical History:  Diagnosis Date  . Anemia   . Atrial fibrillation ([place])   . Blood transfusion without reported diagnosis   . Chronic constipation    colace helps  . Chronic renal insufficiency, stage II (mild)    CrCl @60 .  . Chronic venous insufficiency   . Diabetes mellitus without complication (HCC)    hx of good control  . Gout   . Hyperlipidemia   . Hypertension   . Osteopenia   . Stercoral ulcer of rectum   . Varicose veins of both lower extremities with complications   . Vitamin B12 deficiency     Current Outpatient Medications on File Prior to Visit  Medication Sig Dispense Refill  . allopurinol (ZYLOPRIM) 300 MG tablet TAKE 1 TABLET (300 MG TOTAL) BY MOUTH DAILY. 90 tablet 1  . bisacodyl (DULCOLAX) 5 MG EC tablet Take 5 mg by mouth daily as needed for moderate constipation.    . Calcium Carbonate-Vit D-Min (CALCIUM 1200 PO) Take by mouth.    . ferrous sulfate 325 (65 FE) MG tablet Take 325 mg by mouth daily.     . furosemide (LASIX) 20 MG tablet Take 1 to 2 tabs daily for swelling 180 tablet 1  . HYDROcodone-acetaminophen (NORCO/VICODIN) 5-325 MG tablet Take 1 tablet by mouth every 4 (four) hours as needed for moderate pain. 30 tablet 0  . lisinopril (ZESTRIL) 2.5 MG tablet TAKE 1 TABLET BY  MOUTH EVERY DAY 90 tablet 0  . metFORMIN (GLUCOPHAGE) 500 MG tablet TAKE 1 TABLET BY MOUTH EVERY DAY 90 tablet 1  . metoprolol succinate (TOPROL-XL) 50 MG 24 hr tablet TAKE 1 TABLET BY MOUTH EVERY DAY 90 tablet 1  . simvastatin (ZOCOR) 20 MG tablet TAKE 1 TABLET BY MOUTH EVERY DAY 90 tablet 1  . warfarin (COUMADIN) 5 MG tablet TAKE AS DIRECTED BY ANTICOAGULATION CLINIC 120 tablet 1   No current facility-administered medications on file prior to visit.    Allergies  Allergen Reactions  . Influenza Vac Split [Flu Virus Vaccine] Nausea And Vomiting    Family History  Problem Relation Age of Onset  . Alcohol abuse Father   . Diabetes Father   . Obesity Brother   . Obesity Daughter        S/P gastric bypass    Social History   Socioeconomic History  . Marital status: Married    Spouse name: Not on file  . Number of children: 3  . Years of education: Not on file  . Highest education level: Not on file  Occupational History  . Occupation: retired  Tobacco Use  . Smoking status: Former Smoker    Types: Cigarettes  . Smokeless tobacco: Never Used  Substance and Sexual Activity  . Alcohol use: No  . Drug  use: No  . Sexual activity: Not on file  Other Topics Concern  . Not on file  Social History Narrative   Married, 3 daughters, 4 grandchildren.      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   Retired 2012.      Remote smoking hx: quit 55 yrs ago.  No alcohol in 55 yrs.      Social Determinants of Health   Financial Resource Strain:   . Difficulty of Paying Living Expenses:   Food Insecurity:   . Worried About Charity fundraiser in the Last Year:   . Arboriculturist in the Last Year:   Transportation Needs:   . Film/video editor (Medical):   Marland Kitchen Lack of Transportation (Non-Medical):   Physical Activity:   . Days of Exercise per Week:   . Minutes of Exercise per Session:   Stress:   . Feeling of  Stress :   Social Connections:   . Frequency of Communication with Friends and Family:   . Frequency of Social Gatherings with Friends and Family:   . Attends Religious Services:   . Active Member of Clubs or Organizations:   . Attends Archivist Meetings:   Marland Kitchen Marital Status:    Review of Systems - See HPI.  All other ROS are negative.  BP 128/60   Pulse 65   Temp 98.1 F (36.7 C) (Temporal)   Resp 16   Ht 5\' 11"  (1.803 m)   Wt 220 lb (99.8 kg)   SpO2 99%   BMI 30.68 kg/m   Physical Exam Vitals reviewed.  Constitutional:      Appearance: Normal appearance.  HENT:     Head: Normocephalic and atraumatic.  Musculoskeletal:     Cervical back: Neck supple.  Skin:      Neurological:     Mental Status: He is alert.  Psychiatric:        Mood and Affect: Mood normal.     Recent Results (from the past 2160 hour(s))  POCT INR     Status: None   Collection Time: 01/22/19 12:00 AM  Result Value Ref Range   INR 3.0 2.0 - 3.0  Basic metabolic panel     Status: Abnormal   Collection Time: 01/22/19 11:10 AM  Result Value Ref Range   Sodium 141 135 - 145 mEq/L   Potassium 4.4 3.5 - 5.1 mEq/L   Chloride 107 96 - 112 mEq/L   CO2 28 19 - 32 mEq/L   Glucose, Bld 108 (H) 70 - 99 mg/dL   BUN 25 (H) 6 - 23 mg/dL   Creatinine, Ser 1.84 (H) 0.40 - 1.50 mg/dL   GFR 35.07 (L) >60.00 mL/min   Calcium 9.2 8.4 - 10.5 mg/dL  CBC with Differential/Platelet     Status: Abnormal   Collection Time: 01/22/19 11:10 AM  Result Value Ref Range   WBC 5.3 4.0 - 10.5 K/uL   RBC 3.54 (L) 4.22 - 5.81 Mil/uL   Hemoglobin 10.9 (L) 13.0 - 17.0 g/dL   HCT 33.5 (L) 39.0 - 52.0 %   MCV 94.6 78.0 - 100.0 fl   MCHC 32.5 30.0 - 36.0 g/dL   RDW 16.8 (H) 11.5 - 15.5 %   Platelets 225.0 150.0 - 400.0 K/uL   Neutrophils Relative % 65.1 43.0 - 77.0 %   Lymphocytes Relative 17.8 12.0 - 46.0 %   Monocytes Relative 12.7 (H) 3.0 -  12.0 %   Eosinophils Relative 3.3 0.0 - 5.0 %   Basophils Relative  1.1 0.0 - 3.0 %   Neutro Abs 3.4 1.4 - 7.7 K/uL   Lymphs Abs 0.9 0.7 - 4.0 K/uL   Monocytes Absolute 0.7 0.1 - 1.0 K/uL   Eosinophils Absolute 0.2 0.0 - 0.7 K/uL   Basophils Absolute 0.1 0.0 - 0.1 K/uL  Hemoglobin A1c     Status: None   Collection Time: 02/24/19  2:32 PM  Result Value Ref Range   Hgb A1c MFr Bld 6.0 4.6 - 6.5 %    Comment: Glycemic Control Guidelines for People with Diabetes:Non Diabetic:  <6%Goal of Therapy: <7%Additional Action Suggested:  123456   Basic metabolic panel     Status: Abnormal   Collection Time: 02/24/19  2:32 PM  Result Value Ref Range   Sodium 139 135 - 145 mEq/L   Potassium 4.4 3.5 - 5.1 mEq/L   Chloride 103 96 - 112 mEq/L   CO2 29 19 - 32 mEq/L   Glucose, Bld 125 (H) 70 - 99 mg/dL   BUN 23 6 - 23 mg/dL   Creatinine, Ser 2.05 (H) 0.40 - 1.50 mg/dL   GFR 30.95 (L) >60.00 mL/min   Calcium 9.3 8.4 - 10.5 mg/dL  Basic Metabolic Panel (BMET)     Status: Abnormal   Collection Time: 03/05/19  1:55 PM  Result Value Ref Range   Sodium 136 135 - 145 mEq/L   Potassium 4.5 3.5 - 5.1 mEq/L   Chloride 101 96 - 112 mEq/L   CO2 27 19 - 32 mEq/L   Glucose, Bld 92 70 - 99 mg/dL   BUN 28 (H) 6 - 23 mg/dL   Creatinine, Ser 1.79 (H) 0.40 - 1.50 mg/dL   GFR 36.19 (L) >60.00 mL/min   Calcium 9.5 8.4 - 10.5 mg/dL  POCT INR     Status: None   Collection Time: 03/12/19 12:00 AM  Result Value Ref Range   INR 2.1 2.0 - 3.0    Assessment/Plan: 1. Avulsion of skin of forearm, right, initial encounter Area examined after bandage removed.  Mild bleeding initially but quickly resolved.  Patient up-to-date on INR check, most recent lately last Wednesday with level at 2.1.  Area cleaned with sterile saline.  Dead tissue removed.  Base of superficial avulsion looks good.  It is trying to heal.  Suspect amount of ointment applied to the area is keeping it macerated and delaying healing.  Area dressed with nonstick Telfa and coban. discussed proper cleaning technique with warm  clean water and soap.  Pat completely dry.  Avoid use of Neosporin or bacitracin now.  Keep covered with nonstick dressing.  Change daily.  Follow-up in 1 week for reassessment.  If not improving will consider referral to wound care.  Signs/symptoms of infection reviewed with patient.  He is aware to call us immediately if any of these develop.  This visit occurred during the SARS-CoV-2 public health emergency.  Safety protocols were in place, including screening questions prior to the visit, additional usage of staff PPE, and extensive cleaning of exam room while observing appropriate contact time as indicated for disinfecting solutions.     Leeanne Rio, PA-C

## 2019-03-21 ENCOUNTER — Encounter: Payer: Self-pay | Admitting: Family Medicine

## 2019-03-21 ENCOUNTER — Ambulatory Visit (INDEPENDENT_AMBULATORY_CARE_PROVIDER_SITE_OTHER): Payer: Medicare HMO | Admitting: Family Medicine

## 2019-03-21 ENCOUNTER — Other Ambulatory Visit: Payer: Self-pay

## 2019-03-21 VITALS — BP 124/58 | HR 74 | Temp 95.4°F | Ht 71.0 in | Wt 221.0 lb

## 2019-03-21 DIAGNOSIS — S51801D Unspecified open wound of right forearm, subsequent encounter: Secondary | ICD-10-CM | POA: Diagnosis not present

## 2019-03-21 NOTE — Patient Instructions (Signed)
Follow up as needed or as scheduled Continue to wash area w/ soap and water and then pat dry.  Do not rub! Keep area clean, dry, and covered The wound looks healthy and will continue to fill in from the bottom Call with any questions or concerns Hang in there!

## 2019-03-21 NOTE — Progress Notes (Signed)
   Subjective:    Patient ID: [name], male    DOB: 05-13-1931, 86 y.o.   MRN: [id number]  HPI Wound- pt saw Einar Pheasant on 3/12 w/ skin tear on R forearm.  Wound was cleaned, debrided, and dressed at that time.  Pt has been washing the area w/ soap and water and keeping wound covered.  Pain has improved, no drainage, no surrounding redness   Review of Systems For ROS see HPI   This visit occurred during the SARS-CoV-2 public health emergency.  Safety protocols were in place, including screening questions prior to the visit, additional usage of staff PPE, and extensive cleaning of exam room while observing appropriate contact time as indicated for disinfecting solutions.       Objective:   Physical Exam Vitals reviewed.  Constitutional:      Appearance: Normal appearance.  HENT:     Head: Normocephalic and atraumatic.  Skin:    Comments: 1.5" round skin tear on R forearm w/ clean base and some new granulation tissue present.  No surrounding induration, erythema.  Neurological:     Mental Status: He is alert. Mental status is at baseline.  Psychiatric:        Mood and Affect: Mood normal.        Behavior: Behavior normal.        Thought Content: Thought content normal.           Assessment & Plan:  Wound- improving.  Area is clean and new granulation tissue has appeared.  Reviewed w/ pt that this will heal from the bottom and take time as this is a slow process.  Again reviewed wound care w/ pt and red flags to watch for.  Pt expressed understanding and is in agreement w/ plan.

## 2019-04-09 ENCOUNTER — Other Ambulatory Visit: Payer: Self-pay | Admitting: Family Medicine

## 2019-04-09 DIAGNOSIS — R609 Edema, unspecified: Secondary | ICD-10-CM

## 2019-04-09 DIAGNOSIS — R600 Localized edema: Secondary | ICD-10-CM

## 2019-04-16 ENCOUNTER — Telehealth: Payer: Self-pay | Admitting: Family Medicine

## 2019-04-16 ENCOUNTER — Other Ambulatory Visit: Payer: Self-pay | Admitting: Family Medicine

## 2019-04-16 NOTE — Progress Notes (Signed)
  Chronic Care Management   Note  04/16/2019 Name: [name] MRN: [id number] DOB: Dec 30, 1931  [name] is a 86 y.o. year old male who is a primary care patient of Birdie Riddle, Aundra Millet, MD. I reached out to [name] by phone today in response to a referral sent by [name]'s PCP, Midge Minium, MD.   Mr. Stfleur was given information about Chronic Care Management services today including:  1. CCM service includes personalized support from designated clinical staff supervised by his physician, including individualized plan of care and coordination with other care providers 2. 24/7 contact phone numbers for assistance for urgent and routine care needs. 3. Service will only be billed when office clinical staff spend 20 minutes or more in a month to coordinate care. 4. Only one practitioner may furnish and bill the service in a calendar month. 5. The patient may stop CCM services at any time (effective at the end of the month) by phone call to the office staff.   Patient agreed to services and verbal consent obtained.   Follow up plan:   Earney [facility]

## 2019-04-22 ENCOUNTER — Other Ambulatory Visit: Payer: Self-pay

## 2019-04-23 ENCOUNTER — Ambulatory Visit (INDEPENDENT_AMBULATORY_CARE_PROVIDER_SITE_OTHER): Payer: Medicare HMO | Admitting: General Practice

## 2019-04-23 ENCOUNTER — Ambulatory Visit: Payer: Medicare HMO

## 2019-04-23 DIAGNOSIS — Z7901 Long term (current) use of anticoagulants: Secondary | ICD-10-CM

## 2019-04-23 LAB — POCT INR: INR: 2.2 (ref 2.0–3.0)

## 2019-04-23 NOTE — Patient Instructions (Signed)
Pre visit review using our clinic review tool, if applicable. No additional management support is needed unless otherwise documented below in the visit note.  Continue to take 1 tablet daily except 1/2 tablet on Mondays.  Re-check in 6 weeks.  

## 2019-04-23 NOTE — Progress Notes (Signed)
I have reviewed the results and agree with this plan   

## 2019-04-24 ENCOUNTER — Other Ambulatory Visit: Payer: Self-pay | Admitting: Family Medicine

## 2019-05-10 ENCOUNTER — Other Ambulatory Visit: Payer: Self-pay | Admitting: Family Medicine

## 2019-05-13 ENCOUNTER — Other Ambulatory Visit: Payer: Self-pay | Admitting: General Practice

## 2019-05-13 DIAGNOSIS — I10 Essential (primary) hypertension: Secondary | ICD-10-CM

## 2019-05-13 DIAGNOSIS — Z7901 Long term (current) use of anticoagulants: Secondary | ICD-10-CM

## 2019-05-13 DIAGNOSIS — E669 Obesity, unspecified: Secondary | ICD-10-CM

## 2019-05-13 DIAGNOSIS — I4891 Unspecified atrial fibrillation: Secondary | ICD-10-CM

## 2019-05-13 DIAGNOSIS — E119 Type 2 diabetes mellitus without complications: Secondary | ICD-10-CM

## 2019-05-26 ENCOUNTER — Telehealth: Payer: Medicare HMO

## 2019-05-26 ENCOUNTER — Encounter: Payer: Medicare HMO | Admitting: Family Medicine

## 2019-05-29 ENCOUNTER — Ambulatory Visit: Payer: Medicare HMO

## 2019-05-29 DIAGNOSIS — I4891 Unspecified atrial fibrillation: Secondary | ICD-10-CM

## 2019-05-29 DIAGNOSIS — I10 Essential (primary) hypertension: Secondary | ICD-10-CM

## 2019-05-29 DIAGNOSIS — E119 Type 2 diabetes mellitus without complications: Secondary | ICD-10-CM

## 2019-05-29 DIAGNOSIS — E782 Mixed hyperlipidemia: Secondary | ICD-10-CM

## 2019-05-29 DIAGNOSIS — M109 Gout, unspecified: Secondary | ICD-10-CM

## 2019-05-29 NOTE — Progress Notes (Signed)
Chronic Care Management Pharmacy  Name: [name]  MRN: [id number] DOB: 01/30/1931  Chief Complaint/ HPI  [name],  86 y.o. , male presents for their Initial CCM visit with the clinical pharmacist via telephone due to COVID-19 Pandemic.  PCP : Stephen Minium, MD  Their chronic conditions include:  Encounter Diagnoses  Name Primary?  . Atrial fibrillation, unspecified type ([place]) Yes  . Mixed hyperlipidemia   . Diabetes mellitus without complication ([place])   . Peripheral edema    Patient Active Problem List   Diagnosis Date Noted  . Diastolic dysfunction [id number]  . Obesity (BMI 30-39.9) 02/28/2018  . B12 deficiency 02/28/2018  . Long term (current) use of anticoagulants 09/25/2016  . Chronic constipation 04/29/2015  . Soft tissue mass 04/29/2015  . Chronic venous insufficiency   . Encounter for therapeutic drug monitoring 05/28/2014  . Essential hypertension 03/07/2014  . Preventative health care 03/07/2014  . Varicose veins of lower extremities with other complications 0000000  . Diabetes mellitus without complication ([place]) [id number]  . Iron deficiency anemia due to chronic blood loss 08/26/2013  . Hyperlipidemia 08/13/2006  . GOUT 08/13/2006  . ATRIAL FIBRILLATION 08/13/2006   Social History   Socioeconomic History  . Marital status: Married    Spouse name: Not on file  . Number of children: 3  . Years of education: Not on file  . Highest education level: Not on file  Occupational History  . Occupation: retired  Tobacco Use  . Smoking status: Former Smoker    Types: Cigarettes  . Smokeless tobacco: Never Used  Substance and Sexual Activity  . Alcohol use: No  . Drug use: No  . Sexual activity: Not on file  Other Topics Concern  . Not on file  Social History Narrative   Married, 3 daughters, 4 grandchildren.      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   Retired 2012.       Remote smoking hx: quit 55 yrs ago.  No alcohol in 55 yrs.      Social Determinants of Health   Financial Resource Strain:   . Difficulty of Paying Living Expenses:   Food Insecurity: No Food Insecurity  . Worried About Charity fundraiser in the Last Year: Never true  . Ran Out of Food in the Last Year: Never true  Transportation Needs: No Transportation Needs  . Lack of Transportation (Medical): No  . Lack of Transportation (Non-Medical): No  Physical Activity:   . Days of Exercise per Week:   . Minutes of Exercise per Session:   Stress:   . Feeling of Stress :   Social Connections:   . Frequency of Communication with Friends and Family:   . Frequency of Social Gatherings with Friends and Family:   . Attends Religious Services:   . Active Member of Clubs or Organizations:   . Attends Archivist Meetings:   Marland Kitchen Marital Status:    Family History  Problem Relation Age of Onset  . Alcohol abuse Father   . Diabetes Father   . Obesity Brother   . Obesity Daughter        S/P gastric bypass   Allergies  Allergen Reactions  . Influenza Vac Split [Flu Virus Vaccine] Nausea And Vomiting   Outpatient Encounter Medications as of 05/29/2019  Medication Sig  . allopurinol (ZYLOPRIM) 300 MG tablet TAKE 1 TABLET (300 MG TOTAL) BY MOUTH DAILY.  Marland Kitchen  bisacodyl (DULCOLAX) 5 MG EC tablet Take 5 mg by mouth daily as needed for moderate constipation.  . Calcium Carbonate-Vit D-Min (CALCIUM 1200 PO) Take by mouth.  . ferrous sulfate 325 (65 FE) MG tablet Take 325 mg by mouth daily.   . furosemide (LASIX) 20 MG tablet TAKE 1 TABLET BY MOUTH EVERY DAY (Patient taking differently: 20 mg. TAKE 2 TABLETs BY MOUTH EVERY DAY)  . lisinopril (ZESTRIL) 2.5 MG tablet TAKE 1 TABLET BY MOUTH EVERY DAY  . metFORMIN (GLUCOPHAGE) 500 MG tablet TAKE 1 TABLET BY MOUTH EVERY DAY  . metoprolol succinate (TOPROL-XL) 50 MG 24 hr tablet TAKE 1 TABLET BY MOUTH EVERY DAY  . simvastatin (ZOCOR) 20 MG tablet  TAKE 1 TABLET BY MOUTH EVERY DAY  . warfarin (COUMADIN) 5 MG tablet TAKE AS DIRECTED BY ANTICOAGULATION CLINIC  . HYDROcodone-acetaminophen (NORCO/VICODIN) 5-325 MG tablet Take 1 tablet by mouth every 4 (four) hours as needed for moderate pain. (Patient not taking: Reported on 03/21/2019)   No facility-administered encounter medications on file as of 05/29/2019.   Current Diagnosis/Assessment: Goals Addressed            This Visit's Progress   . PharmD Care Plan       CARE PLAN ENTRY  Current Barriers:  . Chronic Disease Management support, education, and care coordination needs related to Hypertension, Hyperlipidemia, Atrial Fibrillation, Gout.  Atrial Fibrillation . Pharmacist Clinical Goal(s) o Over the next 90 days, patient will work with PharmD and providers to maintain HR <100 and INR 2-3. Marland Kitchen Current regimen:  o Metoprolol succinate 50 mg daily (heart rate control) o Warfarin 5 mg as directed by warfarin clinic (stroke prevention) . Interventions: o Continue current management . Patient self care activities - Over the next 90 days, patient will: o Continue current management  Hypertension / Peripheral Edema . Pharmacist Clinical Goal(s): o Over the next 90 days, patient will work with PharmD and providers to maintain BP goal <140/90 . Current regimen:  o Lisinopril 2.5 mg daily  o Furosemide 20 mg tab - two tablets daily . Interventions: o Continue current management . Patient self care activities - Over the next 90 days, patient will: o Check BP once every 1-2 weeks, document, and provide at future appointments o Ensure daily salt intake < 2300 mg/day  Hyperlipidemia . Pharmacist Clinical Goal(s): o Over the next 90 days, patient will work with PharmD and providers to maintain LDL goal < 100 . Current regimen:  o Simvastatin 20 mg daily  . Interventions: o Continue current management . Patient self care activities - Over the next 90 days, patient will: o Continue  current management      Hypertension / Peripheral Edema.   BP today is:  N/a. Vitals not provided.   BP Readings from Last 3 Encounters:  03/21/19 (!) 124/58  03/14/19 128/60  02/24/19 130/76   Patient checks BP at home several times per month Patient home BP readings are ranging: n/a Denies dizziness, SOB, chest pain.  Patient is currently controlled on the following medications:   Lisinopril 2.5 mg once daily  Lasix 40 mg daily  Plan  Continue current medications.    Diabetes   Recent Relevant Labs: Lab Results  Component Value Date/Time   HGBA1C 6.0 02/24/2019 02:32 PM   HGBA1C 6.0 10/31/2018 09:53 AM   HGBA1C 6.6 02/20/2017 12:00 AM   MICROALBUR 0.8 02/26/2014 11:28 AM   MICROALBUR 0.7 02/06/2006 11:39 AM    A1c 6.0% 02/2019 and  6.0% 10/2018. Denies any side effects at this time.  Patient is currently controlled on the following medications:   Metformin 500 mg once daily   Plan  Continue current medications and control with diet and exercise   AFIB   Pulse Readings from Last 3 Encounters:  03/21/19 74  03/14/19 65  02/24/19 78   Patient is currently rate controlled.  Metoprolol succinate 50 mg once daily   Warfarin for stroke prevention. Denies any abnormal bruising, bleeding from nose or gums or blood in urine or stool. Currently taking:  Warfarin 5 mg as directed by clinic   Plan  Continue current medications   Hyperlipidemia   Lipid Panel     Component Value Date/Time   CHOL 105 10/31/2018 0953   TRIG 55.0 10/31/2018 0953   HDL 42.90 10/31/2018 0953   LDLCALC 52 10/31/2018 0953     The ASCVD Risk score (Goff DC Jr., et al., 2013) failed to calculate for the following reasons:   The 2013 ASCVD risk score is only valid for ages 75 to 95   LDL calc 52 10/2018, total chol 105 10/2018. Denies any muscle or abdominal pain or n/v. Patient is currently controlled on the following medications:  . Simvastatin 20 mg  daily  Plan  Continue current medications   Gout   Denies any gout attack within the last year. Patient is currently controlled on the following medications:  . Allopurinol 300 mg once daily   Plan  Continue current medications   Vaccines   Reviewed patient's vaccination history.    Immunization History  Administered Date(s) Administered  . PFIZER SARS-COV-2 Vaccination 03/25/2019, 04/22/2019  . Pneumococcal Conjugate-13 02/26/2014  . Pneumococcal Polysaccharide-23 01/02/2006, 12/20/2015  . Td 01/03/2000  . Tdap 02/26/2014    Plan  Recommended patient receive Shingrix vaccine in pharmacy.   Medication Management   Receives prescription medications from:  [facility] - [place], [place] - [address] [place] [address] Phone: [phone] Fax: [phone]   Plan  Utilize [facility] for medication synchronization, packaging and delivery.  Follow up: 2 month phone visit. ______________ Visit Information SDOH (Social Determinants of Health) assessments performed: Yes.  Verbal consent obtained for [facility] enhanced pharmacy services (medication synchronization, adherence packaging, delivery coordination). A medication sync plan was created to allow patient to get all medications delivered once every 30 to 90 days per patient preference. Patient understands they have freedom to choose pharmacy and clinical pharmacist will coordinate care between all prescribers and [facility].   Mr. Kilmer was given information about Chronic Care Management services today including:  1. CCM service includes personalized support from designated clinical staff supervised by his physician, including individualized plan of care and coordination with other care providers 2. 24/7 contact phone numbers for assistance for urgent and routine care needs. 3. Standard insurance, coinsurance, copays and deductibles  apply for chronic care management only during months in which we provide at least 20 minutes of these services. Most insurances cover these services at 100%, however patients may be responsible for any copay, coinsurance and/or deductible if applicable. This service may help you avoid the need for more expensive face-to-face services. 4. Only one practitioner may furnish and bill the service in a calendar month. 5. The patient may stop CCM services at any time (effective at the end of the month) by phone call to the office staff.  Patient agreed to services and verbal consent obtained.  Madelin Rear, Pharm.D., BCGP Clinical Pharmacist [facility] [phone]

## 2019-05-30 NOTE — Patient Instructions (Addendum)
Please call me at [phone] (direct line) with any questions - thank you!  - [name]., Clinical Pharmacist  Goals Addressed            This Visit's Progress   . PharmD Care Plan       CARE PLAN ENTRY  Current Barriers:  . Chronic Disease Management support, education, and care coordination needs related to Hypertension, Hyperlipidemia, Atrial Fibrillation, Gout.  Atrial Fibrillation . Pharmacist Clinical Goal(s) o Over the next 90 days, patient will work with PharmD and providers to maintain HR <100 and INR 2-3. Marland Kitchen Current regimen:  o Metoprolol succinate 50 mg daily (heart rate control) o Warfarin 5 mg as directed by warfarin clinic (stroke prevention) . Interventions: o Continue current management . Patient self care activities - Over the next 90 days, patient will: o Continue current management  Hypertension / Peripheral Edema . Pharmacist Clinical Goal(s): o Over the next 90 days, patient will work with PharmD and providers to maintain BP goal <140/90 . Current regimen:  o Lisinopril 2.5 mg daily  o Furosemide 20 mg tab - two tablets daily . Interventions: o Continue current management . Patient self care activities - Over the next 90 days, patient will: o Check BP once every 1-2 weeks, document, and provide at future appointments o Ensure daily salt intake < 2300 mg/day  Hyperlipidemia . Pharmacist Clinical Goal(s): o Over the next 90 days, patient will work with PharmD and providers to maintain LDL goal < 100 . Current regimen:  o Simvastatin 20 mg daily  . Interventions: o Continue current management . Patient self care activities - Over the next 90 days, patient will: o Continue current management  Gout . Pharmacist Clinical Goal(s) o Over the next 90 days, patient will work with PharmD and providers to prevent recurrence of gout . Current regimen:  o Allopurinol 300 mg daily  . Interventions: o Continue current management . Patient self care  activities - Over the next 90 days, patient will: o Continue current management  Diabetes . Pharmacist Clinical Goal(s): o Over the next 90 days, patient will work with PharmD and providers to maintain A1c goal <7% . Current regimen:  o Metformin 500 mg once daily  . Interventions: o Continue current management . Patient self care activities - Over the next 90 days, patient will: o Check blood sugar if directed, document, and provide at future appointments o Contact provider with any episodes of hypoglycemia  Medication management . Pharmacist Clinical Goal(s): o Over the next 90 days, patient will work with PharmD and providers to achieve optimal medication adherence . Current pharmacy: [facility]  . Interventions o Comprehensive medication review performed. o Continue current medication management strategy . Patient self care activities - Over the next 90 days, patient will: o Focus on medication adherence by taking medications as prescribed.  o Report any questions or concerns to PharmD and/or provider(s).  Initial goal documentation.      [name] was given information about Chronic Care Management services today including:  1. CCM service includes personalized support from designated clinical staff supervised by his physician, including individualized plan of care and coordination with other care providers 2. 24/7 contact phone numbers for assistance for urgent and routine care needs. 3. Standard insurance, coinsurance, copays and deductibles apply for chronic care management only during months in which we provide at least 20 minutes of these services. Most insurances cover these services at 100%, however patients may be responsible for any  copay, coinsurance and/or deductible if applicable. This service may help you avoid the need for more expensive face-to-face services. 4. Only one practitioner may furnish and bill the service in a calendar month. 5. The patient may  stop CCM services at any time (effective at the end of the month) by phone call to the office staff.  Patient agreed to services and verbal consent obtained.   The patient verbalized understanding of instructions provided today and agreed to receive a mailed copy of patient instruction and/or educational materials. Telephone follow up appointment with pharmacy team member scheduled for: See next appointment with "Care Management Staff" under "What's Next" below.   Thank you!  [name], Pharm.D., BCGP Clinical Pharmacist [facility] [phone]  Hypertension, Adult High blood pressure (hypertension) is when the force of blood pumping through the arteries is too strong. The arteries are the blood vessels that carry blood from the heart throughout the body. Hypertension forces the heart to work harder to pump blood and may cause arteries to become narrow or stiff. Untreated or uncontrolled hypertension can cause a heart attack, heart failure, a stroke, kidney disease, and other problems. A blood pressure reading consists of a higher number over a lower number. Ideally, your blood pressure should be below 120/80. The first ("top") number is called the systolic pressure. It is a measure of the pressure in your arteries as your heart beats. The second ("bottom") number is called the diastolic pressure. It is a measure of the pressure in your arteries as the heart relaxes. What are the causes? The exact cause of this condition is not known. There are some conditions that result in or are related to high blood pressure. What increases the risk? Some risk factors for high blood pressure are under your control. The following factors may make you more likely to develop this condition:  Smoking.  Having type 2 diabetes mellitus, high cholesterol, or both.  Not getting enough exercise or physical activity.  Being overweight.  Having too much fat, sugar,  calories, or salt (sodium) in your diet.  Drinking too much alcohol. Some risk factors for high blood pressure may be difficult or impossible to change. Some of these factors include:  Having chronic kidney disease.  Having a family history of high blood pressure.  Age. Risk increases with age.  Race. You may be at higher risk if you are African American.  Gender. Men are at higher risk than women before age 45. After age 40, women are at higher risk than men.  Having obstructive sleep apnea.  Stress. What are the signs or symptoms? High blood pressure may not cause symptoms. Very high blood pressure (hypertensive crisis) may cause:  Headache.  Anxiety.  Shortness of breath.  Nosebleed.  Nausea and vomiting.  Vision changes.  Severe chest pain.  Seizures. How is this diagnosed? This condition is diagnosed by measuring your blood pressure while you are seated, with your arm resting on a flat surface, your legs uncrossed, and your feet flat on the floor. The cuff of the blood pressure monitor will be placed directly against the skin of your upper arm at the level of your heart. It should be measured at least twice using the same arm. Certain conditions can cause a difference in blood pressure between your right and left arms. Certain factors can cause blood pressure readings to be lower or higher than normal for a short period of time:  When your blood pressure  is higher when you are in a health care provider's office than when you are at home, this is called white coat hypertension. Most people with this condition do not need medicines.  When your blood pressure is higher at home than when you are in a health care provider's office, this is called masked hypertension. Most people with this condition may need medicines to control blood pressure. If you have a high blood pressure reading during one visit or you have normal blood pressure with other risk factors, you may be  asked to:  Return on a different day to have your blood pressure checked again.  Monitor your blood pressure at home for 1 week or longer. If you are diagnosed with hypertension, you may have other blood or imaging tests to help your health care provider understand your overall risk for other conditions. How is this treated? This condition is treated by making healthy lifestyle changes, such as eating healthy foods, exercising more, and reducing your alcohol intake. Your health care provider may prescribe medicine if lifestyle changes are not enough to get your blood pressure under control, and if:  Your systolic blood pressure is above 130.  Your diastolic blood pressure is above 80. Your personal target blood pressure may vary depending on your medical conditions, your age, and other factors. Follow these instructions at home: Eating and drinking   Eat a diet that is high in fiber and potassium, and low in sodium, added sugar, and fat. An example eating plan is called the DASH (Dietary Approaches to Stop Hypertension) diet. To eat this way: ? Eat plenty of fresh fruits and vegetables. Try to fill one half of your plate at each meal with fruits and vegetables. ? Eat whole grains, such as whole-wheat pasta, brown rice, or whole-grain bread. Fill about one fourth of your plate with whole grains. ? Eat or drink low-fat dairy products, such as skim milk or low-fat yogurt. ? Avoid fatty cuts of meat, processed or cured meats, and poultry with skin. Fill about one fourth of your plate with lean proteins, such as fish, chicken without skin, beans, eggs, or tofu. ? Avoid pre-made and processed foods. These tend to be higher in sodium, added sugar, and fat.  Reduce your daily sodium intake. Most people with hypertension should eat less than 1,500 mg of sodium a day.  Do not drink alcohol if: ? Your health care provider tells you not to drink. ? You are pregnant, may be pregnant, or are planning  to become pregnant.  If you drink alcohol: ? Limit how much you use to:  0-1 drink a day for women.  0-2 drinks a day for men. ? Be aware of how much alcohol is in your drink. In the [place] one drink equals one 12 oz bottle of beer (355 mL), one 5 oz glass of wine (148 mL), or one 1 oz glass of hard liquor (44 mL). Lifestyle   Work with your health care provider to maintain a healthy body weight or to lose weight. Ask what an ideal weight is for you.  Get at least 30 minutes of exercise most days of the week. Activities may include walking, swimming, or biking.  Include exercise to strengthen your muscles (resistance exercise), such as Pilates or lifting weights, as part of your weekly exercise routine. Try to do these types of exercises for 30 minutes at least 3 days a week.  Do not use any products that contain nicotine or tobacco, such  as cigarettes, e-cigarettes, and chewing tobacco. If you need help quitting, ask your health care provider.  Monitor your blood pressure at home as told by your health care provider.  Keep all follow-up visits as told by your health care provider. This is important. Medicines  Take over-the-counter and prescription medicines only as told by your health care provider. Follow directions carefully. Blood pressure medicines must be taken as prescribed.  Do not skip doses of blood pressure medicine. Doing this puts you at risk for problems and can make the medicine less effective.  Ask your health care provider about side effects or reactions to medicines that you should watch for. Contact a health care provider if you:  Think you are having a reaction to a medicine you are taking.  Have headaches that keep coming back (recurring).  Feel dizzy.  Have swelling in your ankles.  Have trouble with your vision. Get help right away if you:  Develop a severe headache or confusion.  Have unusual weakness or numbness.  Feel faint.  Have severe  pain in your chest or abdomen.  Vomit repeatedly.  Have trouble breathing. Summary  Hypertension is when the force of blood pumping through your arteries is too strong. If this condition is not controlled, it may put you at risk for serious complications.  Your personal target blood pressure may vary depending on your medical conditions, your age, and other factors. For most people, a normal blood pressure is less than 120/80.  Hypertension is treated with lifestyle changes, medicines, or a combination of both. Lifestyle changes include losing weight, eating a healthy, low-sodium diet, exercising more, and limiting alcohol. This information is not intended to replace advice given to you by your health care provider. Make sure you discuss any questions you have with your health care provider. Document Revised: 08/29/2017 Document Reviewed: 08/29/2017 Elsevier Patient Education  2020 [employer].

## 2019-06-03 ENCOUNTER — Other Ambulatory Visit: Payer: Self-pay

## 2019-06-04 ENCOUNTER — Ambulatory Visit (INDEPENDENT_AMBULATORY_CARE_PROVIDER_SITE_OTHER): Payer: Medicare HMO | Admitting: General Practice

## 2019-06-04 DIAGNOSIS — Z7901 Long term (current) use of anticoagulants: Secondary | ICD-10-CM | POA: Diagnosis not present

## 2019-06-04 LAB — POCT INR: INR: 1.9 — AB (ref 2.0–3.0)

## 2019-06-04 NOTE — Patient Instructions (Signed)
Pre visit review using our clinic review tool, if applicable. No additional management support is needed unless otherwise documented below in the visit note.  Take 1 1/2 tablets today and then continue to take 1 tablet daily except 1/2 tablet on Mondays.  Re-check in 4 weeks.

## 2019-06-04 NOTE — Progress Notes (Signed)
I have reviewed the results and agree with this plan   

## 2019-06-23 ENCOUNTER — Ambulatory Visit: Payer: Medicare HMO

## 2019-06-23 ENCOUNTER — Other Ambulatory Visit: Payer: Self-pay

## 2019-06-23 NOTE — Progress Notes (Unsigned)
Patient rescheduled

## 2019-06-30 ENCOUNTER — Ambulatory Visit: Payer: Medicare HMO

## 2019-06-30 ENCOUNTER — Other Ambulatory Visit: Payer: Self-pay

## 2019-06-30 ENCOUNTER — Ambulatory Visit (INDEPENDENT_AMBULATORY_CARE_PROVIDER_SITE_OTHER): Payer: Medicare HMO | Admitting: General Practice

## 2019-06-30 DIAGNOSIS — Z7901 Long term (current) use of anticoagulants: Secondary | ICD-10-CM | POA: Diagnosis not present

## 2019-06-30 DIAGNOSIS — I4891 Unspecified atrial fibrillation: Secondary | ICD-10-CM

## 2019-06-30 LAB — POCT INR: INR: 3.4 — AB (ref 2.0–3.0)

## 2019-06-30 NOTE — Patient Instructions (Signed)
Pre visit review using our clinic review tool, if applicable. No additional management support is needed unless otherwise documented below in the visit note.  Skip coumadin today and then continue to take 1 tablet daily except 1/2 tablet on Mondays.  Re-check in 4 weeks.

## 2019-06-30 NOTE — Progress Notes (Signed)
Erroneous encounter

## 2019-07-02 ENCOUNTER — Ambulatory Visit: Payer: Medicare HMO

## 2019-07-08 ENCOUNTER — Ambulatory Visit (INDEPENDENT_AMBULATORY_CARE_PROVIDER_SITE_OTHER): Payer: Medicare HMO

## 2019-07-08 VITALS — Ht 71.0 in | Wt 215.0 lb

## 2019-07-08 DIAGNOSIS — Z0000 Encounter for general adult medical examination without abnormal findings: Secondary | ICD-10-CM

## 2019-07-08 NOTE — Patient Instructions (Signed)
[name] , Thank you for taking time to complete your Medicare Wellness Visit. I appreciate your ongoing commitment to your health goals. Please review the following plan we discussed and let me know if I can assist you in the future.   Screening recommendations/referrals: Colonoscopy: Sigmoidoscopy completed 05/07/2017 -No longer indicated Recommended yearly ophthalmology/optometry visit for glaucoma screening and checkup Recommended yearly dental visit for hygiene and checkup  Vaccinations: Influenza vaccine: Unable to take Pneumococcal vaccine: Completed vaccines Tdap vaccine: Up to date-Due 02/27/2024 Shingles vaccine: Discuss with pharmacy   Covid-19: Completed vaccines  Advanced directives: Copy on file in chart  Conditions/risks identified: see problem list  Next appointment: Follow up in one year for your annual wellness visit.   Preventive Care 84 Years and Older, Male Preventive care refers to lifestyle choices and visits with your health care provider that can promote health and wellness. What does preventive care include?  A yearly physical exam. This is also called an annual well check.  Dental exams once or twice a year.  Routine eye exams. Ask your health care provider how often you should have your eyes checked.  Personal lifestyle choices, including:  Daily care of your teeth and gums.  Regular physical activity.  Eating a healthy diet.  Avoiding tobacco and drug use.  Limiting alcohol use.  Practicing safe sex.  Taking low doses of aspirin every day.  Taking vitamin and mineral supplements as recommended by your health care provider. What happens during an annual well check? The services and screenings done by your health care provider during your annual well check will depend on your age, overall health, lifestyle risk factors, and family history of disease. Counseling  Your health care provider may ask you questions about your:  Alcohol  use.  Tobacco use.  Drug use.  Emotional well-being.  Home and relationship well-being.  Sexual activity.  Eating habits.  History of falls.  Memory and ability to understand (cognition).  Work and work Statistician. Screening  You may have the following tests or measurements:  Height, weight, and BMI.  Blood pressure.  Lipid and cholesterol levels. These may be checked every 5 years, or more frequently if you are over 10 years old.  Skin check.  Lung cancer screening. You may have this screening every year starting at age 40 if you have a 30-pack-year history of smoking and currently smoke or have quit within the past 15 years.  Fecal occult blood test (FOBT) of the stool. You may have this test every year starting at age 32.  Flexible sigmoidoscopy or colonoscopy. You may have a sigmoidoscopy every 5 years or a colonoscopy every 10 years starting at age 84.  Prostate cancer screening. Recommendations will vary depending on your family history and other risks.  Hepatitis C blood test.  Hepatitis B blood test.  Sexually transmitted disease (STD) testing.  Diabetes screening. This is done by checking your blood sugar (glucose) after you have not eaten for a while (fasting). You may have this done every 1-3 years.  Abdominal aortic aneurysm (AAA) screening. You may need this if you are a current or former smoker.  Osteoporosis. You may be screened starting at age 84 if you are at high risk. Talk with your health care provider about your test results, treatment options, and if necessary, the need for more tests. Vaccines  Your health care provider may recommend certain vaccines, such as:  Influenza vaccine. This is recommended every year.  Tetanus, diphtheria, and acellular  pertussis (Tdap, Td) vaccine. You may need a Td booster every 10 years.  Zoster vaccine. You may need this after age 31.  Pneumococcal 13-valent conjugate (PCV13) vaccine. One dose is  recommended after age 86.  Pneumococcal polysaccharide (PPSV23) vaccine. One dose is recommended after age 84. Talk to your health care provider about which screenings and vaccines you need and how often you need them. This information is not intended to replace advice given to you by your health care provider. Make sure you discuss any questions you have with your health care provider. Document Released: 01/15/2015 Document Revised: 09/08/2015 Document Reviewed: 10/20/2014 Elsevier Interactive Patient Education  2017 [place] Prevention in the Home Falls can cause injuries. They can happen to people of all ages. There are many things you can do to make your home safe and to help prevent falls. What can I do on the outside of my home?  Regularly fix the edges of walkways and driveways and fix any cracks.  Remove anything that might make you trip as you walk through a door, such as a raised step or threshold.  Trim any bushes or trees on the path to your home.  Use bright outdoor lighting.  Clear any walking paths of anything that might make someone trip, such as rocks or tools.  Regularly check to see if handrails are loose or broken. Make sure that both sides of any steps have handrails.  Any raised decks and porches should have guardrails on the edges.  Have any leaves, snow, or ice cleared regularly.  Use sand or salt on walking paths during winter.  Clean up any spills in your garage right away. This includes oil or grease spills. What can I do in the bathroom?  Use night lights.  Install grab bars by the toilet and in the tub and shower. Do not use towel bars as grab bars.  Use non-skid mats or decals in the tub or shower.  If you need to sit down in the shower, use a plastic, non-slip stool.  Keep the floor dry. Clean up any water that spills on the floor as soon as it happens.  Remove soap buildup in the tub or shower regularly.  Attach bath mats  securely with double-sided non-slip rug tape.  Do not have throw rugs and other things on the floor that can make you trip. What can I do in the bedroom?  Use night lights.  Make sure that you have a light by your bed that is easy to reach.  Do not use any sheets or blankets that are too big for your bed. They should not hang down onto the floor.  Have a firm chair that has side arms. You can use this for support while you get dressed.  Do not have throw rugs and other things on the floor that can make you trip. What can I do in the kitchen?  Clean up any spills right away.  Avoid walking on wet floors.  Keep items that you use a lot in easy-to-reach places.  If you need to reach something above you, use a strong step stool that has a grab bar.  Keep electrical cords out of the way.  Do not use floor polish or wax that makes floors slippery. If you must use wax, use non-skid floor wax.  Do not have throw rugs and other things on the floor that can make you trip. What can I do with my stairs?  Do not leave any items on the stairs.  Make sure that there are handrails on both sides of the stairs and use them. Fix handrails that are broken or loose. Make sure that handrails are as long as the stairways.  Check any carpeting to make sure that it is firmly attached to the stairs. Fix any carpet that is loose or worn.  Avoid having throw rugs at the top or bottom of the stairs. If you do have throw rugs, attach them to the floor with carpet tape.  Make sure that you have a light switch at the top of the stairs and the bottom of the stairs. If you do not have them, ask someone to add them for you. What else can I do to help prevent falls?  Wear shoes that:  Do not have high heels.  Have rubber bottoms.  Are comfortable and fit you well.  Are closed at the toe. Do not wear sandals.  If you use a stepladder:  Make sure that it is fully opened. Do not climb a closed  stepladder.  Make sure that both sides of the stepladder are locked into place.  Ask someone to hold it for you, if possible.  Clearly mark and make sure that you can see:  Any grab bars or handrails.  First and last steps.  Where the edge of each step is.  Use tools that help you move around (mobility aids) if they are needed. These include:  Canes.  Walkers.  Scooters.  Crutches.  Turn on the lights when you go into a dark area. Replace any light bulbs as soon as they burn out.  Set up your furniture so you have a clear path. Avoid moving your furniture around.  If any of your floors are uneven, fix them.  If there are any pets around you, be aware of where they are.  Review your medicines with your doctor. Some medicines can make you feel dizzy. This can increase your chance of falling. Ask your doctor what other things that you can do to help prevent falls. This information is not intended to replace advice given to you by your health care provider. Make sure you discuss any questions you have with your health care provider. Document Released: 10/15/2008 Document Revised: 05/27/2015 Document Reviewed: 01/23/2014 Elsevier Interactive Patient Education  2017 [employer].

## 2019-07-08 NOTE — Progress Notes (Signed)
Subjective:   [name] is a 86 y.o. male who presents for Medicare Annual/Subsequent preventive examination.  I connected with Ron today by telephone and verified that I am speaking with the correct person using two identifiers. Location patient: home Location provider: work Persons participating in the virtual visit: patient, Marine scientist.    I discussed the limitations, risks, security and privacy concerns of performing an evaluation and management service by telephone and the availability of in person appointments. I also discussed with the patient that there may be a patient responsible charge related to this service. The patient expressed understanding and verbally consented to this telephonic visit.    Interactive audio and video telecommunications were attempted between this provider and patient, however failed, due to patient having technical difficulties OR patient did not have access to video capability.  We continued and completed visit with audio only.  Some vital signs may be absent or patient reported.   Time Spent with patient on telephone encounter: 30 minutes  Review of Systems:   Cardiac Risk Factors include: advanced age (>34men, >50 women);dyslipidemia;hypertension;sedentary lifestyle;male gender     Objective:    Vitals: Ht 5\' 11"  (1.803 m)   Wt 215 lb (97.5 kg)   BMI 29.99 kg/m   Body mass index is 29.99 kg/m.  Advanced Directives 07/08/2019 06/28/2017 04/11/2016 01/21/2016 01/21/2016  Does Patient Have a Medical Advance Directive? Yes Yes Yes Yes Yes  Type of Paramedic of [place];Living will [place];Living will - - Living will  Does patient want to make changes to medical advance directive? - - - No - Patient declined -  Copy of [place] in Chart? Yes - validated most recent copy scanned in chart (See row information) Yes - - -    Tobacco Social History   Tobacco Use  Smoking Status Former  Smoker  . Types: Cigarettes  Smokeless Tobacco Never Used     Counseling given: Not Answered   Clinical Intake:  Pre-visit preparation completed: Yes  Pain : No/denies pain     Nutritional Status: BMI 25 -29 Overweight Nutritional Risks: None Diabetes: Yes CBG done?: No Did pt. bring in CBG monitor from home?: No  How often do you need to have someone help you when you read instructions, pamphlets, or other written materials from your doctor or pharmacy?: 1 - Never  Nutrition Risk Assessment:  Has the patient had any N/V/D within the last 2 months?  No  Does the patient have any non-healing wounds?  No  Has the patient had any unintentional weight loss or weight gain?  No   Diabetes:  Is the patient diabetic?  Yes  If diabetic, was a CBG obtained today?  No  Did the patient bring in their glucometer from home?  No  How often do you monitor your CBG's? rarely.   Financial Strains and Diabetes Management:  Are you having any financial strains with the device, your supplies or your medication? No .  Does the patient want to be seen by Chronic Care Management for management of their diabetes?  No  Would the patient like to be referred to a Nutritionist or for Diabetic Management?  No   Diabetic Exams:  Diabetic Eye Exam: Completed 04/2019. Patient to have report sent to PCP Diabetic Foot Exam: Completed 02/24/2019.      Information entered by :: Caroleen Hamman LPM  Past Medical History:  Diagnosis Date  . Anemia   .  Atrial fibrillation ([place])   . Blood transfusion without reported diagnosis   . Chronic constipation    colace helps  . Chronic renal insufficiency, stage II (mild)    CrCl @60 .  . Chronic venous insufficiency   . Diabetes mellitus without complication (HCC)    hx of good control  . Gout   . Hyperlipidemia   . Hypertension   . Osteopenia   . Stercoral ulcer of rectum   . Varicose veins of both lower extremities with complications   . Vitamin  B12 deficiency    Past Surgical History:  Procedure Laterality Date  . BUNIONECTOMY  2006  . COLONOSCOPY N/A 01/22/2016   Procedure: COLONOSCOPY;  Surgeon: Manus Gunning, MD;  Location: [facility];  Service: Gastroenterology;  Laterality: N/A;  . [place]  . TONSILLECTOMY AND ADENOIDECTOMY  1945   Family History  Problem Relation Age of Onset  . Alcohol abuse Father   . Diabetes Father   . Obesity Brother   . Obesity Daughter        S/P gastric bypass   Social History   Socioeconomic History  . Marital status: Married    Spouse name: Not on file  . Number of children: 3  . Years of education: Not on file  . Highest education level: Not on file  Occupational History  . Occupation: retired  Tobacco Use  . Smoking status: Former Smoker    Types: Cigarettes  . Smokeless tobacco: Never Used  Vaping Use  . Vaping Use: Never used  Substance and Sexual Activity  . Alcohol use: No  . Drug use: No  . Sexual activity: Not on file  Other Topics Concern  . Not on file  Social History Narrative   Married, 3 daughters, 4 grandchildren.      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   Retired 2012.      Remote smoking hx: quit 55 yrs ago.  No alcohol in 55 yrs.      Social Determinants of Health   Financial Resource Strain: Low Risk   . Difficulty of Paying Living Expenses: Not very hard  Food Insecurity: No Food Insecurity  . Worried About Charity fundraiser in the Last Year: Never true  . Ran Out of Food in the Last Year: Never true  Transportation Needs: No Transportation Needs  . Lack of Transportation (Medical): No  . Lack of Transportation (Non-Medical): No  Physical Activity:   . Days of Exercise per Week:   . Minutes of Exercise per Session:   Stress: No Stress Concern Present  . Feeling of Stress : Not at all  Social Connections: Moderately Integrated  . Frequency of  Communication with Friends and Family: Once a week  . Frequency of Social Gatherings with Friends and Family: Once a week  . Attends Religious Services: More than 4 times per year  . Active Member of Clubs or Organizations: Yes  . Attends Archivist Meetings: More than 4 times per year  . Marital Status: Married    Outpatient Encounter Medications as of 07/08/2019  Medication Sig  . allopurinol (ZYLOPRIM) 300 MG tablet TAKE 1 TABLET (300 MG TOTAL) BY MOUTH DAILY.  . Calcium Carbonate-Vit D-Min (CALCIUM 1200 PO) Take by mouth.  . ferrous sulfate 325 (65 FE) MG tablet Take 325 mg by mouth daily.   . furosemide (LASIX) 20 MG tablet TAKE 1 TABLET BY MOUTH  EVERY DAY (Patient taking differently: 20 mg. TAKE 2 TABLETs BY MOUTH EVERY DAY)  . lisinopril (ZESTRIL) 2.5 MG tablet TAKE 1 TABLET BY MOUTH EVERY DAY  . metFORMIN (GLUCOPHAGE) 500 MG tablet TAKE 1 TABLET BY MOUTH EVERY DAY  . metoprolol succinate (TOPROL-XL) 50 MG 24 hr tablet TAKE 1 TABLET BY MOUTH EVERY DAY  . simvastatin (ZOCOR) 20 MG tablet TAKE 1 TABLET BY MOUTH EVERY DAY  . vitamin B-12 (CYANOCOBALAMIN) 500 MCG tablet Take 500 mcg by mouth daily.  Marland Kitchen warfarin (COUMADIN) 5 MG tablet TAKE AS DIRECTED BY ANTICOAGULATION CLINIC  . bisacodyl (DULCOLAX) 5 MG EC tablet Take 5 mg by mouth daily as needed for moderate constipation. (Patient not taking: Reported on 07/08/2019)  . HYDROcodone-acetaminophen (NORCO/VICODIN) 5-325 MG tablet Take 1 tablet by mouth every 4 (four) hours as needed for moderate pain. (Patient not taking: Reported on 03/21/2019)   No facility-administered encounter medications on file as of 07/08/2019.    Activities of Daily Living In your present state of health, do you have any difficulty performing the following activities: 07/08/2019 02/24/2019  Hearing? N N  Vision? N N  Difficulty concentrating or making decisions? N N  Walking or climbing stairs? N N  Dressing or bathing? N N  Doing errands, shopping? N N    Preparing Food and eating ? N -  Using the Toilet? N -  In the past six months, have you accidently leaked urine? N -  Do you have problems with loss of bowel control? N -  Managing your Medications? N -  Managing your Finances? N -  Housekeeping or managing your Housekeeping? N -  Some recent data might be hidden    Patient Care Team: Midge Minium, MD as PCP - General (Family Medicine) Elza Rafter, MD as Consulting Physician (Family Medicine) Armbruster, Carlota Raspberry, MD as Consulting Physician (Gastroenterology) [facility] (Internal Medicine) Madelin Rear, [facility] as Pharmacist (Pharmacist)   Assessment:   This is a routine wellness examination for [place].  Exercise Activities and Dietary recommendations Current Exercise Habits: The patient does not participate in regular exercise at present  Goals Addressed            This Visit's Progress   . Patient Stated   On track    Maintain current health.        Fall Risk Fall Risk  07/08/2019 02/24/2019 11/14/2018 06/25/2018 06/19/2018  Falls in the past year? 0 0 0 0 1  Number falls in past yr: 0 0 0 0 0  Injury with Fall? 0 0 0 0 1  Comment - - - - pt thinks he broke his toe  Follow up Falls prevention discussed Falls evaluation completed Falls evaluation completed - -   FALL RISK PREVENTION PERTAINING TO THE HOME:  Any stairs in or around the home? Yes  If so, are there any without handrails? No   Home free of loose throw rugs in walkways, pet beds, electrical cords, etc? Yes  Adequate lighting in your home to reduce risk of falls? Yes   ASSISTIVE DEVICES UTILIZED TO PREVENT FALLS:  Life alert? No  Use of a cane, walker or w/c? Yes  Grab bars in the bathroom? Yes  Shower chair or bench in shower? No  Elevated toilet seat or a handicapped toilet? No    TIMED UP AND GO:  Was the test performed? No . Virtual visit     Depression Screen [facility] 2/9 Scores 07/08/2019 02/24/2019 11/14/2018 06/25/2018  PHQ -  2 Score 0 0  0 0  PHQ- 9 Score - - - -    Cognitive Function: No cognitive impairment noted. Plays word search games frequently. MMSE - Mini Mental State Exam 06/28/2017  Orientation to time 5  Orientation to Place 5  Registration 3  Attention/ Calculation 5  Recall 2  Language- name 2 objects 2  Language- repeat 0  Language- follow 3 step command 3  Language- read & follow direction 1  Write a sentence 1  Copy design 1  Total score 28        Immunization History  Administered Date(s) Administered  . PFIZER SARS-COV-2 Vaccination 03/25/2019, 04/22/2019  . Pneumococcal Conjugate-13 02/26/2014  . Pneumococcal Polysaccharide-23 01/02/2006, 12/20/2015  . Td 01/03/2000  . Tdap 02/26/2014    Qualifies for Shingles Vaccine? Yes  Zostavax completed no. Due for Shingrix. Education has been provided regarding the importance of this vaccine. Pt has been advised to call insurance company to determine out of pocket expense. Advised may also receive vaccine at local pharmacy or Health Dept. Verbalized acceptance and understanding.  Tdap: Up to date  Flu Vaccine: Allergic to the flu vaccine   Pneumococcal Vaccine: Completed vaccines   Covid-19 Vaccine:  Completed vaccines  Screening Tests Health Maintenance  Topic Date Due  . OPHTHALMOLOGY EXAM  03/03/2018  . HEMOGLOBIN A1C  08/24/2019  . FOOT EXAM  02/24/2020  . TETANUS/TDAP  02/27/2024  . COVID-19 Vaccine  Completed  . PNA vac Low Risk Adult  Completed   Cancer Screenings:  Colorectal Screening: Completed 05/07/2017.  No longer required.   Lung Cancer Screening: (Low Dose CT Chest recommended if Age 45-80 years, 30 pack-year currently smoking OR have quit w/in 15years.) does not qualify.    Additional Screening:  Hepatitis C Screening: does not qualify;   Dental Screening: Recommended annual dental exams for proper oral hygiene  Community Resource Referral:  CRR required this visit?  No        Plan:  I have personally  reviewed and addressed the Medicare Annual Wellness questionnaire and have noted the following in the patient's chart:  A. Medical and social history B. Use of alcohol, tobacco or illicit drugs  C. Current medications and supplements D. Functional ability and status E.  Nutritional status F.  Physical activity G. Advance directives H. List of other physicians I.  Hospitalizations, surgeries, and ER visits in previous 12 months J.  [place] such as hearing and vision if needed, cognitive and depression L. Referrals and appointments   In addition, I have reviewed and discussed with patient certain preventive protocols, quality metrics, and best practice recommendations. A written personalized care plan for preventive services as well as general preventive health recommendations were provided to patient.  Due to this being a telephonic visit, the after visit summary with patients personalized plan was offered to patient via mail or my-chart.  Patient would like to access on my-chart.   Signed,   Marta Antu, LPN  05/05/6268 Nurse Health Advisor  Nurse Notes: None

## 2019-07-16 ENCOUNTER — Other Ambulatory Visit: Payer: Self-pay | Admitting: Family Medicine

## 2019-07-28 ENCOUNTER — Other Ambulatory Visit: Payer: Self-pay

## 2019-07-28 ENCOUNTER — Ambulatory Visit (INDEPENDENT_AMBULATORY_CARE_PROVIDER_SITE_OTHER): Payer: Medicare HMO | Admitting: General Practice

## 2019-07-28 DIAGNOSIS — Z7901 Long term (current) use of anticoagulants: Secondary | ICD-10-CM

## 2019-07-28 DIAGNOSIS — I4891 Unspecified atrial fibrillation: Secondary | ICD-10-CM

## 2019-07-28 LAB — POCT INR: INR: 2.9 (ref 2.0–3.0)

## 2019-07-28 NOTE — Patient Instructions (Addendum)
Pre visit review using our clinic review tool, if applicable. No additional management support is needed unless otherwise documented below in the visit note. ° °Continue to take 1 tablet daily except 1/2 tablet on Mondays.  Re-check in 4 weeks. ° °

## 2019-07-29 ENCOUNTER — Telehealth: Payer: Medicare HMO

## 2019-07-29 ENCOUNTER — Telehealth: Payer: Self-pay

## 2019-07-31 ENCOUNTER — Other Ambulatory Visit: Payer: Self-pay | Admitting: Family Medicine

## 2019-07-31 ENCOUNTER — Ambulatory Visit: Payer: Medicare HMO

## 2019-07-31 NOTE — Progress Notes (Unsigned)
error 

## 2019-07-31 NOTE — Progress Notes (Deleted)
***Patient not seen  Chronic Care Management Pharmacy  Name: [name]  MRN: [id number] DOB: October 06, 1931  Chief Complaint/ HPI  [name],  86 y.o. , male presents for their Initial CCM visit with the clinical pharmacist via telephone due to COVID-19 Pandemic.  PCP : Midge Minium, MD  Physical 3-4 months from 02/24/2019  BP  HR  Reduce allopurinol dose?  [facility]  Their chronic conditions include:  No diagnosis found. Patient Active Problem List   Diagnosis Date Noted  . Diastolic dysfunction 87/56/4332  . Obesity (BMI 30-39.9) 02/28/2018  . B12 deficiency 02/28/2018  . Long term (current) use of anticoagulants 09/25/2016  . Chronic constipation 04/29/2015  . Soft tissue mass 04/29/2015  . Chronic venous insufficiency   . Encounter for therapeutic drug monitoring 05/28/2014  . Essential hypertension 03/07/2014  . Preventative health care 03/07/2014  . Varicose veins of lower extremities with other complications 95/18/8416  . Diabetes mellitus without complication ([place]) 60/63/0160  . Iron deficiency anemia due to chronic blood loss 08/26/2013  . Hyperlipidemia 08/13/2006  . GOUT 08/13/2006  . ATRIAL FIBRILLATION 08/13/2006   Social History   Socioeconomic History  . Marital status: Married    Spouse name: Not on file  . Number of children: 3  . Years of education: Not on file  . Highest education level: Not on file  Occupational History  . Occupation: retired  Tobacco Use  . Smoking status: Former Smoker    Types: Cigarettes  . Smokeless tobacco: Never Used  Vaping Use  . Vaping Use: Never used  Substance and Sexual Activity  . Alcohol use: No  . Drug use: No  . Sexual activity: Not on file  Other Topics Concern  . Not on file  Social History Narrative   Married, 3 daughters, 4 grandchildren.      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   Retired 2012.       Remote smoking hx: quit 55 yrs ago.  No alcohol in 55 yrs.      Social Determinants of Health   Financial Resource Strain: Low Risk   . Difficulty of Paying Living Expenses: Not very hard  Food Insecurity: No Food Insecurity  . Worried About Charity fundraiser in the Last Year: Never true  . Ran Out of Food in the Last Year: Never true  Transportation Needs: No Transportation Needs  . Lack of Transportation (Medical): No  . Lack of Transportation (Non-Medical): No  Physical Activity:   . Days of Exercise per Week:   . Minutes of Exercise per Session:   Stress: No Stress Concern Present  . Feeling of Stress : Not at all  Social Connections: Moderately Integrated  . Frequency of Communication with Friends and Family: Once a week  . Frequency of Social Gatherings with Friends and Family: Once a week  . Attends Religious Services: More than 4 times per year  . Active Member of Clubs or Organizations: Yes  . Attends Archivist Meetings: More than 4 times per year  . Marital Status: Married   Family History  Problem Relation Age of Onset  . Alcohol abuse Father   . Diabetes Father   . Obesity Brother   . Obesity Daughter        S/P gastric bypass   Allergies  Allergen Reactions  . Influenza Vac Split [Flu Virus Vaccine] Nausea And Vomiting  Outpatient Encounter Medications as of 08/01/2019  Medication Sig  . allopurinol (ZYLOPRIM) 300 MG tablet TAKE 1 TABLET (300 MG TOTAL) BY MOUTH DAILY.  . bisacodyl (DULCOLAX) 5 MG EC tablet Take 5 mg by mouth daily as needed for moderate constipation. (Patient not taking: Reported on 07/08/2019)  . Calcium Carbonate-Vit D-Min (CALCIUM 1200 PO) Take by mouth.  . ferrous sulfate 325 (65 FE) MG tablet Take 325 mg by mouth daily.   . furosemide (LASIX) 20 MG tablet TAKE 1 TABLET BY MOUTH EVERY DAY (Patient taking differently: 20 mg. TAKE 2 TABLETs BY MOUTH EVERY DAY)  . HYDROcodone-acetaminophen (NORCO/VICODIN) 5-325 MG tablet Take 1  tablet by mouth every 4 (four) hours as needed for moderate pain. (Patient not taking: Reported on 03/21/2019)  . lisinopril (ZESTRIL) 2.5 MG tablet TAKE 1 TABLET BY MOUTH EVERY DAY  . metFORMIN (GLUCOPHAGE) 500 MG tablet TAKE 1 TABLET BY MOUTH EVERY DAY  . metoprolol succinate (TOPROL-XL) 50 MG 24 hr tablet TAKE 1 TABLET BY MOUTH EVERY DAY  . simvastatin (ZOCOR) 20 MG tablet TAKE 1 TABLET BY MOUTH EVERY DAY  . vitamin B-12 (CYANOCOBALAMIN) 500 MCG tablet Take 500 mcg by mouth daily.  Marland Kitchen warfarin (COUMADIN) 5 MG tablet TAKE AS DIRECTED BY ANTICOAGULATION CLINIC   No facility-administered encounter medications on file as of 08/01/2019.   Current Diagnosis/Assessment: Goals Addressed   None    Hypertension / Peripheral Edema.   BP today is:  N/a. Vitals not provided.   BP Readings from Last 3 Encounters:  03/21/19 (!) 124/58  03/14/19 128/60  02/24/19 130/76   Patient checks BP at home several times per month Patient home BP readings are ranging: n/a Denies dizziness, SOB, chest pain.  Patient is currently controlled on the following medications:   Lisinopril 2.5 mg once daily  Lasix 40 mg daily  Plan  Continue current medications.    Diabetes   Recent Relevant Labs: Lab Results  Component Value Date/Time   HGBA1C 6.0 02/24/2019 02:32 PM   HGBA1C 6.0 10/31/2018 09:53 AM   HGBA1C 6.6 02/20/2017 12:00 AM   MICROALBUR 0.8 02/26/2014 11:28 AM   MICROALBUR 0.7 02/06/2006 11:39 AM    A1c 6.0% 02/2019 and 6.0% 10/2018. Denies any side effects at this time.  Patient is currently controlled on the following medications:   Metformin 500 mg once daily   Plan  Continue current medications and control with diet and exercise   AFIB   Pulse Readings from Last 3 Encounters:  03/21/19 74  03/14/19 65  02/24/19 78   Patient is currently rate controlled.  Metoprolol succinate 50 mg once daily   Warfarin for stroke prevention. Denies any abnormal bruising, bleeding from  nose or gums or blood in urine or stool. Currently taking:  Warfarin 5 mg as directed by clinic   Plan  Continue current medications    Hyperlipidemia   Lipid Panel     Component Value Date/Time   CHOL 105 10/31/2018 0953   TRIG 55.0 10/31/2018 0953   HDL 42.90 10/31/2018 0953   LDLCALC 52 10/31/2018 0953     The ASCVD Risk score (Goff DC Jr., et al., 2013) failed to calculate for the following reasons:   The 2013 ASCVD risk score is only valid for ages 66 to 76   LDL calc 52 10/2018, total chol 105 10/2018. Denies any muscle or abdominal pain or n/v. Patient is currently controlled on the following medications:  . Simvastatin 20 mg daily Future Appointments  Date Time Provider [place]  08/01/2019  9:30 AM [facility] CCM PHARMACIST [facility]  08/25/2019  2:15 PM [facility] COUMADIN [facility]    Plan  Continue current medications   Gout   Kidney Function Lab Results  Component Value Date/Time   CREATININE 1.79 (H) 03/05/2019 01:55 PM   CREATININE 2.05 (H) 02/24/2019 02:32 PM   CREATININE 1.71 (H) 06/21/2018 02:36 PM   CREATININE 1.06 08/07/2016 10:29 AM   GFR 36.19 (L) 03/05/2019 01:55 PM   GFRNONAA 50 (L) 01/23/2016 05:15 AM   GFRAA 58 (L) 01/23/2016 05:15 AM   K 4.5 03/05/2019 01:55 PM   K 4.4 02/24/2019 02:32 PM    Denies any gout attack within the last year. Patient is currently controlled on the following medications:  . Allopurinol 300 mg once daily   Plan  Continue current medications   Vaccines   Reviewed patient's vaccination history.    Immunization History  Administered Date(s) Administered  . PFIZER SARS-COV-2 Vaccination 03/25/2019, 04/22/2019  . Pneumococcal Conjugate-13 02/26/2014  . Pneumococcal Polysaccharide-23 01/02/2006, 12/20/2015  . Td 01/03/2000  . Tdap 02/26/2014    Plan  Recommended patient receive Shingrix vaccine in pharmacy.   Medication Management   Receives prescription medications from:  [facility] - [place], [place] - [address] [place] [address] Phone: [phone] Fax: [phone]  [id number] - [place], [place] - [address] [place] [address] Phone: [phone] Fax: [phone]   Plan  Utilize [facility] for medication synchronization, packaging and delivery.  Follow up: 2 month phone visit. ____________________________  Madelin Rear, Pharm.D., BCGP Clinical Pharmacist [facility] [phone]

## 2019-07-31 NOTE — Progress Notes (Deleted)
***Patient not seen  Chronic Care Management Pharmacy  Name: [name]  MRN: [id number] DOB: 11-Jun-1931  Chief Complaint/ HPI  [name],  86 y.o. , male presents for their Initial CCM visit with the clinical pharmacist via telephone due to COVID-19 Pandemic.  PCP : Midge Minium, MD  Physical 3-4 months from 02/24/2019  BP  HR  Reduce allopurinol dose?  [facility]  Their chronic conditions include:  No diagnosis found. Patient Active Problem List   Diagnosis Date Noted  . Diastolic dysfunction 08/65/7846  . Obesity (BMI 30-39.9) 02/28/2018  . B12 deficiency 02/28/2018  . Long term (current) use of anticoagulants 09/25/2016  . Chronic constipation 04/29/2015  . Soft tissue mass 04/29/2015  . Chronic venous insufficiency   . Encounter for therapeutic drug monitoring 05/28/2014  . Essential hypertension 03/07/2014  . Preventative health care 03/07/2014  . Varicose veins of lower extremities with other complications 96/29/5284  . Diabetes mellitus without complication ([place]) 13/24/4010  . Iron deficiency anemia due to chronic blood loss 08/26/2013  . Hyperlipidemia 08/13/2006  . GOUT 08/13/2006  . ATRIAL FIBRILLATION 08/13/2006   Social History   Socioeconomic History  . Marital status: Married    Spouse name: Not on file  . Number of children: 3  . Years of education: Not on file  . Highest education level: Not on file  Occupational History  . Occupation: retired  Tobacco Use  . Smoking status: Former Smoker    Types: Cigarettes  . Smokeless tobacco: Never Used  Vaping Use  . Vaping Use: Never used  Substance and Sexual Activity  . Alcohol use: No  . Drug use: No  . Sexual activity: Not on file  Other Topics Concern  . Not on file  Social History Narrative   Married, 3 daughters, 4 grandchildren.      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   Retired 2012.       Remote smoking hx: quit 55 yrs ago.  No alcohol in 55 yrs.      Social Determinants of Health   Financial Resource Strain: Low Risk   . Difficulty of Paying Living Expenses: Not very hard  Food Insecurity: No Food Insecurity  . Worried About Charity fundraiser in the Last Year: Never true  . Ran Out of Food in the Last Year: Never true  Transportation Needs: No Transportation Needs  . Lack of Transportation (Medical): No  . Lack of Transportation (Non-Medical): No  Physical Activity:   . Days of Exercise per Week:   . Minutes of Exercise per Session:   Stress: No Stress Concern Present  . Feeling of Stress : Not at all  Social Connections: Moderately Integrated  . Frequency of Communication with Friends and Family: Once a week  . Frequency of Social Gatherings with Friends and Family: Once a week  . Attends Religious Services: More than 4 times per year  . Active Member of Clubs or Organizations: Yes  . Attends Archivist Meetings: More than 4 times per year  . Marital Status: Married   Family History  Problem Relation Age of Onset  . Alcohol abuse Father   . Diabetes Father   . Obesity Brother   . Obesity Daughter        S/P gastric bypass   Allergies  Allergen Reactions  . Influenza Vac Split [Flu Virus Vaccine] Nausea And Vomiting  Outpatient Encounter Medications as of 07/31/2019  Medication Sig  . allopurinol (ZYLOPRIM) 300 MG tablet TAKE 1 TABLET (300 MG TOTAL) BY MOUTH DAILY.  . bisacodyl (DULCOLAX) 5 MG EC tablet Take 5 mg by mouth daily as needed for moderate constipation. (Patient not taking: Reported on 07/08/2019)  . Calcium Carbonate-Vit D-Min (CALCIUM 1200 PO) Take by mouth.  . ferrous sulfate 325 (65 FE) MG tablet Take 325 mg by mouth daily.   . furosemide (LASIX) 20 MG tablet TAKE 1 TABLET BY MOUTH EVERY DAY (Patient taking differently: 20 mg. TAKE 2 TABLETs BY MOUTH EVERY DAY)  . HYDROcodone-acetaminophen (NORCO/VICODIN) 5-325 MG tablet Take 1  tablet by mouth every 4 (four) hours as needed for moderate pain. (Patient not taking: Reported on 03/21/2019)  . lisinopril (ZESTRIL) 2.5 MG tablet TAKE 1 TABLET BY MOUTH EVERY DAY  . metFORMIN (GLUCOPHAGE) 500 MG tablet TAKE 1 TABLET BY MOUTH EVERY DAY  . metoprolol succinate (TOPROL-XL) 50 MG 24 hr tablet TAKE 1 TABLET BY MOUTH EVERY DAY  . simvastatin (ZOCOR) 20 MG tablet TAKE 1 TABLET BY MOUTH EVERY DAY  . vitamin B-12 (CYANOCOBALAMIN) 500 MCG tablet Take 500 mcg by mouth daily.  Marland Kitchen warfarin (COUMADIN) 5 MG tablet TAKE AS DIRECTED BY ANTICOAGULATION CLINIC   No facility-administered encounter medications on file as of 07/31/2019.   Current Diagnosis/Assessment: Goals Addressed   None    Hypertension / Peripheral Edema.   BP today is:  N/a. Vitals not provided.   BP Readings from Last 3 Encounters:  03/21/19 (!) 124/58  03/14/19 128/60  02/24/19 130/76   Patient checks BP at home several times per month Patient home BP readings are ranging: n/a Denies dizziness, SOB, chest pain.  Patient is currently controlled on the following medications:   Lisinopril 2.5 mg once daily  Lasix 40 mg daily  Plan  Continue current medications.    Diabetes   Recent Relevant Labs: Lab Results  Component Value Date/Time   HGBA1C 6.0 02/24/2019 02:32 PM   HGBA1C 6.0 10/31/2018 09:53 AM   HGBA1C 6.6 02/20/2017 12:00 AM   MICROALBUR 0.8 02/26/2014 11:28 AM   MICROALBUR 0.7 02/06/2006 11:39 AM    A1c 6.0% 02/2019 and 6.0% 10/2018. Denies any side effects at this time.  Patient is currently controlled on the following medications:   Metformin 500 mg once daily   Plan  Continue current medications and control with diet and exercise   AFIB   Pulse Readings from Last 3 Encounters:  03/21/19 74  03/14/19 65  02/24/19 78   Patient is currently rate controlled.  Metoprolol succinate 50 mg once daily   Warfarin for stroke prevention. Denies any abnormal bruising, bleeding from  nose or gums or blood in urine or stool. Currently taking:  Warfarin 5 mg as directed by clinic   Plan  Continue current medications    Hyperlipidemia   Lipid Panel     Component Value Date/Time   CHOL 105 10/31/2018 0953   TRIG 55.0 10/31/2018 0953   HDL 42.90 10/31/2018 0953   LDLCALC 52 10/31/2018 0953     The ASCVD Risk score (Goff DC Jr., et al., 2013) failed to calculate for the following reasons:   The 2013 ASCVD risk score is only valid for ages 23 to 81   LDL calc 52 10/2018, total chol 105 10/2018. Denies any muscle or abdominal pain or n/v. Patient is currently controlled on the following medications:  . Simvastatin 20 mg daily Future Appointments  Date Time Provider [place]  07/31/2019 10:30 AM [facility] CCM PHARMACIST [facility]  08/25/2019  2:15 PM [facility] COUMADIN [facility]    Plan  Continue current medications   Gout   Kidney Function Lab Results  Component Value Date/Time   CREATININE 1.79 (H) 03/05/2019 01:55 PM   CREATININE 2.05 (H) 02/24/2019 02:32 PM   CREATININE 1.71 (H) 06/21/2018 02:36 PM   CREATININE 1.06 08/07/2016 10:29 AM   GFR 36.19 (L) 03/05/2019 01:55 PM   GFRNONAA 50 (L) 01/23/2016 05:15 AM   GFRAA 58 (L) 01/23/2016 05:15 AM   K 4.5 03/05/2019 01:55 PM   K 4.4 02/24/2019 02:32 PM    Denies any gout attack within the last year. Patient is currently controlled on the following medications:  . Allopurinol 300 mg once daily   Plan  Continue current medications   Vaccines   Reviewed patient's vaccination history.    Immunization History  Administered Date(s) Administered  . PFIZER SARS-COV-2 Vaccination 03/25/2019, 04/22/2019  . Pneumococcal Conjugate-13 02/26/2014  . Pneumococcal Polysaccharide-23 01/02/2006, 12/20/2015  . Td 01/03/2000  . Tdap 02/26/2014    Plan  Recommended patient receive Shingrix vaccine in pharmacy.   Medication Management   Receives prescription medications from:  [facility] - [place], [place] - [address] [place] [address] Phone: [phone] Fax: [phone]  [id number] - [place], [place] - [address] [place] [address] Phone: [phone] Fax: [phone]   Plan  Utilize [facility] for medication synchronization, packaging and delivery.  Follow up: 2 month phone visit. ____________________________  Madelin Rear, Pharm.D., BCGP Clinical Pharmacist [facility] at [facility] [phone]

## 2019-08-01 ENCOUNTER — Telehealth: Payer: Medicare HMO

## 2019-08-01 ENCOUNTER — Other Ambulatory Visit: Payer: Self-pay

## 2019-08-01 LAB — HEPATIC FUNCTION PANEL
ALT: 13 (ref 10–40)
AST: 16 (ref 14–40)
Alkaline Phosphatase: 143 — AB (ref 25–125)
Bilirubin, Direct: 0.5 — AB (ref 0.01–0.4)
Bilirubin, Total: 1

## 2019-08-01 LAB — HEMOGLOBIN A1C: Hemoglobin A1C: 6.1

## 2019-08-01 LAB — BASIC METABOLIC PANEL
BUN: 31 — AB (ref 4–21)
CO2: 32 — AB (ref 13–22)
Chloride: 104 (ref 99–108)
Creatinine: 1.9 — AB (ref 0.6–1.3)
Glucose: 76
Potassium: 4.6 (ref 3.4–5.3)
Sodium: 138 (ref 137–147)

## 2019-08-01 LAB — LIPID PANEL
Cholesterol: 116 (ref 0–200)
HDL: 51 (ref 35–70)
LDL Cholesterol: 54
Triglycerides: 55 (ref 40–160)

## 2019-08-01 LAB — VITAMIN D 25 HYDROXY (VIT D DEFICIENCY, FRACTURES): Vit D, 25-Hydroxy: 42.33

## 2019-08-01 LAB — CBC: RBC: 3.85 — AB (ref 3.87–5.11)

## 2019-08-01 LAB — CBC AND DIFFERENTIAL
HCT: 37 — AB (ref 41–53)
Hemoglobin: 11.9 — AB (ref 13.5–17.5)
Neutrophils Absolute: 4
Platelets: 234 (ref 150–399)
WBC: 5.9

## 2019-08-01 LAB — COMPREHENSIVE METABOLIC PANEL
Albumin: 3.3 — AB (ref 3.5–5.0)
Calcium: 9.6 (ref 8.7–10.7)
GFR calc non Af Amer: 34

## 2019-08-01 LAB — TSH: TSH: 1.41 (ref 0.41–5.90)

## 2019-08-01 LAB — VITAMIN B12: Vitamin B-12: 2802

## 2019-08-07 ENCOUNTER — Other Ambulatory Visit: Payer: Self-pay | Admitting: Family Medicine

## 2019-08-07 ENCOUNTER — Ambulatory Visit: Payer: Medicare HMO

## 2019-08-07 NOTE — Progress Notes (Unsigned)
***Patient not seen  Chronic Care Management Pharmacy  Name: [name]  MRN: [id number] DOB: 1931-01-16  Chief Complaint/ HPI  [name],  86 y.o. , male presents for their Initial CCM visit with the clinical pharmacist via telephone due to COVID-19 Pandemic.  PCP : Midge Minium, MD  Physical 3-4 months from 02/24/2019  BP  HR  Reduce allopurinol dose?  [facility]  Their chronic conditions include:  No diagnosis found. Patient Active Problem List   Diagnosis Date Noted  . Diastolic dysfunction 32/02/3341  . Obesity (BMI 30-39.9) 02/28/2018  . B12 deficiency 02/28/2018  . Long term (current) use of anticoagulants 09/25/2016  . Chronic constipation 04/29/2015  . Soft tissue mass 04/29/2015  . Chronic venous insufficiency   . Encounter for therapeutic drug monitoring 05/28/2014  . Essential hypertension 03/07/2014  . Preventative health care 03/07/2014  . Varicose veins of lower extremities with other complications 56/86/1683  . Diabetes mellitus without complication ([place]) 72/90/2111  . Iron deficiency anemia due to chronic blood loss 08/26/2013  . Hyperlipidemia 08/13/2006  . GOUT 08/13/2006  . ATRIAL FIBRILLATION 08/13/2006   Social History   Socioeconomic History  . Marital status: Married    Spouse name: Not on file  . Number of children: 3  . Years of education: Not on file  . Highest education level: Not on file  Occupational History  . Occupation: retired  Tobacco Use  . Smoking status: Former Smoker    Types: Cigarettes  . Smokeless tobacco: Never Used  Vaping Use  . Vaping Use: Never used  Substance and Sexual Activity  . Alcohol use: No  . Drug use: No  . Sexual activity: Not on file  Other Topics Concern  . Not on file  Social History Narrative   Married, 3 daughters, 4 grandchildren.      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   Retired 2012.       Remote smoking hx: quit 55 yrs ago.  No alcohol in 55 yrs.      Social Determinants of Health   Financial Resource Strain: Low Risk   . Difficulty of Paying Living Expenses: Not very hard  Food Insecurity: No Food Insecurity  . Worried About Charity fundraiser in the Last Year: Never true  . Ran Out of Food in the Last Year: Never true  Transportation Needs: No Transportation Needs  . Lack of Transportation (Medical): No  . Lack of Transportation (Non-Medical): No  Physical Activity:   . Days of Exercise per Week:   . Minutes of Exercise per Session:   Stress: No Stress Concern Present  . Feeling of Stress : Not at all  Social Connections: Moderately Integrated  . Frequency of Communication with Friends and Family: Once a week  . Frequency of Social Gatherings with Friends and Family: Once a week  . Attends Religious Services: More than 4 times per year  . Active Member of Clubs or Organizations: Yes  . Attends Archivist Meetings: More than 4 times per year  . Marital Status: Married   Family History  Problem Relation Age of Onset  . Alcohol abuse Father   . Diabetes Father   . Obesity Brother   . Obesity Daughter        S/P gastric bypass   Allergies  Allergen Reactions  . Influenza Vac Split [Flu Virus Vaccine] Nausea And Vomiting  Outpatient Encounter Medications as of 08/07/2019  Medication Sig  . allopurinol (ZYLOPRIM) 300 MG tablet TAKE 1 TABLET (300 MG TOTAL) BY MOUTH DAILY.  . bisacodyl (DULCOLAX) 5 MG EC tablet Take 5 mg by mouth daily as needed for moderate constipation. (Patient not taking: Reported on 07/08/2019)  . Calcium Carbonate-Vit D-Min (CALCIUM 1200 PO) Take by mouth.  . ferrous sulfate 325 (65 FE) MG tablet Take 325 mg by mouth daily.   . furosemide (LASIX) 20 MG tablet TAKE 1 TABLET BY MOUTH EVERY DAY (Patient taking differently: 20 mg. TAKE 2 TABLETs BY MOUTH EVERY DAY)  . HYDROcodone-acetaminophen (NORCO/VICODIN) 5-325 MG tablet Take 1  tablet by mouth every 4 (four) hours as needed for moderate pain. (Patient not taking: Reported on 03/21/2019)  . lisinopril (ZESTRIL) 2.5 MG tablet TAKE 1 TABLET BY MOUTH EVERY DAY  . metFORMIN (GLUCOPHAGE) 500 MG tablet TAKE 1 TABLET BY MOUTH EVERY DAY  . metoprolol succinate (TOPROL-XL) 50 MG 24 hr tablet TAKE 1 TABLET BY MOUTH EVERY DAY  . simvastatin (ZOCOR) 20 MG tablet TAKE 1 TABLET BY MOUTH EVERY DAY  . vitamin B-12 (CYANOCOBALAMIN) 500 MCG tablet Take 500 mcg by mouth daily.  Marland Kitchen warfarin (COUMADIN) 5 MG tablet TAKE AS DIRECTED BY ANTICOAGULATION CLINIC   No facility-administered encounter medications on file as of 08/07/2019.   Current Diagnosis/Assessment: Goals Addressed   None    Hypertension / Peripheral Edema.   BP today is:  N/a. Vitals not provided.   BP Readings from Last 3 Encounters:  03/21/19 (!) 124/58  03/14/19 128/60  02/24/19 130/76   Patient checks BP at home several times per month Patient home BP readings are ranging: n/a Denies dizziness, SOB, chest pain.  Patient is currently controlled on the following medications:   Lisinopril 2.5 mg once daily  Lasix 40 mg daily  Plan  Continue current medications.    Diabetes   Recent Relevant Labs: Lab Results  Component Value Date/Time   HGBA1C 6.0 02/24/2019 02:32 PM   HGBA1C 6.0 10/31/2018 09:53 AM   HGBA1C 6.6 02/20/2017 12:00 AM   MICROALBUR 0.8 02/26/2014 11:28 AM   MICROALBUR 0.7 02/06/2006 11:39 AM    A1c 6.0% 02/2019 and 6.0% 10/2018. Denies any side effects at this time.  Patient is currently controlled on the following medications:   Metformin 500 mg once daily   Plan  Continue current medications and control with diet and exercise   AFIB   Pulse Readings from Last 3 Encounters:  03/21/19 74  03/14/19 65  02/24/19 78   Lab Results  Component Value Date   INR 2.9 07/28/2019   INR 3.4 (A) 06/30/2019   INR 1.9 (A) 06/04/2019   Patient is currently rate  controlled.  Metoprolol succinate 50 mg once daily   Warfarin for stroke prevention. Denies any abnormal bruising, bleeding from nose or gums or blood in urine or stool. Currently taking:  Warfarin 5 mg as directed by clinic   Plan  Continue current medications    Hyperlipidemia   Lipid Panel     Component Value Date/Time   CHOL 105 10/31/2018 0953   TRIG 55.0 10/31/2018 0953   HDL 42.90 10/31/2018 0953   LDLCALC 52 10/31/2018 0953     The ASCVD Risk score (Goff DC Jr., et al., 2013) failed to calculate for the following reasons:   The 2013 ASCVD risk score is only valid for ages 97 to 64   LDL calc 52 10/2018, total chol 105 10/2018.  Denies any muscle or abdominal pain or n/v. Patient is currently controlled on the following medications:  . Simvastatin 20 mg daily Future Appointments  Date Time Provider [place]  08/25/2019  2:15 PM [facility] COUMADIN [facility]    Plan  Continue current medications   Gout   Kidney Function Lab Results  Component Value Date/Time   CREATININE 1.79 (H) 03/05/2019 01:55 PM   CREATININE 2.05 (H) 02/24/2019 02:32 PM   CREATININE 1.71 (H) 06/21/2018 02:36 PM   CREATININE 1.06 08/07/2016 10:29 AM   GFR 36.19 (L) 03/05/2019 01:55 PM   GFRNONAA 50 (L) 01/23/2016 05:15 AM   GFRAA 58 (L) 01/23/2016 05:15 AM   K 4.5 03/05/2019 01:55 PM   K 4.4 02/24/2019 02:32 PM    Denies any gout attack within the last year. Patient is currently controlled on the following medications:  . Allopurinol 300 mg once daily   Plan  Continue current medications   Vaccines   Reviewed patient's vaccination history.    Immunization History  Administered Date(s) Administered  . PFIZER SARS-COV-2 Vaccination 03/25/2019, 04/22/2019  . Pneumococcal Conjugate-13 02/26/2014  . Pneumococcal Polysaccharide-23 01/02/2006, 12/20/2015  . Td 01/03/2000  . Tdap 02/26/2014    Plan  Recommended patient receive Shingrix vaccine in pharmacy.    Medication Management   Receives prescription medications from:  [facility] - [place], [place] - [address] [place] [address] Phone: [phone] Fax: [phone]  [id number] - [place], [place] - [address] [place] [address] Phone: [phone] Fax: [phone]   Plan  Utilize [facility] for medication synchronization, packaging and delivery.  Follow up: 2 month phone visit. ____________________________  Madelin Rear, Pharm.D., BCGP Clinical Pharmacist [facility] at [facility] [phone]

## 2019-08-15 ENCOUNTER — Ambulatory Visit: Payer: Medicare HMO

## 2019-08-15 DIAGNOSIS — I10 Essential (primary) hypertension: Secondary | ICD-10-CM

## 2019-08-15 DIAGNOSIS — E119 Type 2 diabetes mellitus without complications: Secondary | ICD-10-CM

## 2019-08-15 DIAGNOSIS — M109 Gout, unspecified: Secondary | ICD-10-CM

## 2019-08-15 NOTE — Patient Instructions (Addendum)
I've included a handout on the low purine diet that we had discussed. Please call me at [phone] (direct line) with any questions - thank you! Edyth Gunnels., Clinical Pharmacist  Goals Addressed            This Visit's Progress   . PharmD Care Plan   On track    CARE PLAN ENTRY  Current Barriers:  . Chronic Disease Management support, education, and care coordination needs related to Hypertension, Hyperlipidemia, Atrial Fibrillation, Gout.   Atrial Fibrillation . Pharmacist Clinical Goal(s) o Over the next 90 days, patient will work with PharmD and providers to maintain HR <100 and INR 2-3. Marland Kitchen Current regimen:  o Metoprolol succinate 50 mg daily (heart rate control) o Warfarin 5 mg as directed by warfarin clinic (stroke prevention) . Interventions: o Continue current management . Patient self care activities - Over the next 90 days, patient will: o Continue current management  Hypertension / Peripheral Edema . Pharmacist Clinical Goal(s): o Over the next 90 days, patient will work with PharmD and providers to maintain BP goal <140/90 . Current regimen:  o Lisinopril 2.5 mg daily  o Furosemide 20 mg tab - two tablets daily . Interventions: o Continue current management . Patient self care activities - Over the next 90 days, patient will: o Check BP once every 1-2 weeks, document, and provide at future appointments o Ensure daily salt intake < 2300 mg/day  Hyperlipidemia . Pharmacist Clinical Goal(s): o Over the next 90 days, patient will work with PharmD and providers to maintain LDL goal < 100 . Current regimen:  o Simvastatin 20 mg daily  . Interventions: o Continue current management . Patient self care activities - Over the next 90 days, patient will: o Continue current management  Gout . Pharmacist Clinical Goal(s) o Over the next 90 days, patient will work with PharmD and providers to prevent recurrence of gout . Current regimen:  o Allopurinol 300 mg daily   . Interventions: o Continue current management . Patient self care activities - Over the next 90 days, patient will: o Continue current management  Diabetes . Pharmacist Clinical Goal(s): o Over the next 90 days, patient will work with PharmD and providers to maintain A1c goal <7% . Current regimen:  o Metformin 500 mg once daily  . Interventions: o Continue current management . Patient self care activities - Over the next 90 days, patient will: o Check blood sugar if directed, document, and provide at future appointments o Contact provider with any episodes of hypoglycemia  Medication management . Pharmacist Clinical Goal(s): o Over the next 90 days, patient will work with PharmD and providers to achieve optimal medication adherence . Current pharmacy: [facility]  . Interventions o Comprehensive medication review performed. o Continue current medication management strategy . Patient self care activities - Over the next 90 days, patient will: o Focus on medication adherence by taking medications as prescribed.  o Report any questions or concerns to PharmD and/or provider(s).  Initial goal documentation.      The patient verbalized understanding of instructions provided today and agreed to receive a mailed copy of patient instruction and/or educational materials. Telephone follow up appointment with pharmacy team member scheduled for: See next appointment with "Care Management Staff" under "What's Next" below.   Madelin Rear, Pharm.D., BCGP Clinical Pharmacist [facility] [phone]    Low-Purine Eating Plan A low-purine eating plan involves making food choices to limit your intake of purine. Purine is a kind of  uric acid. Too much uric acid in your blood can cause certain conditions, such as gout and kidney stones. Eating a low-purine diet can help control these conditions. What are tips for following this plan? Reading food labels   Avoid foods with  saturated or Trans fat.  Check the ingredient list of grains-based foods, such as bread and cereal, to make sure that they contain whole grains.  Check the ingredient list of sauces or soups to make sure they do not contain meat or fish.  When choosing soft drinks, check the ingredient list to make sure they do not contain high-fructose corn syrup. Shopping  Buy plenty of fresh fruits and vegetables.  Avoid buying canned or fresh fish.  Buy dairy products labeled as low-fat or nonfat.  Avoid buying premade or processed foods. These foods are often high in fat, salt (sodium), and added sugar. Cooking  Use olive oil instead of butter when cooking. Oils like olive oil, canola oil, and sunflower oil contain healthy fats. Meal planning  Learn which foods do or do not affect you. If you find out that a food tends to cause your gout symptoms to flare up, avoid eating that food. You can enjoy foods that do not cause problems. If you have any questions about a food item, talk with your dietitian or health care provider.  Limit foods high in fat, especially saturated fat. Fat makes it harder for your body to get rid of uric acid.  Choose foods that are lower in fat and are lean sources of protein. General guidelines  Limit alcohol intake to no more than 1 drink a day for nonpregnant women and 2 drinks a day for men. One drink equals 12 oz of beer, 5 oz of wine, or 1 oz of hard liquor. Alcohol can affect the way your body gets rid of uric acid.  Drink plenty of water to keep your urine clear or pale yellow. Fluids can help remove uric acid from your body.  If directed by your health care provider, take a vitamin C supplement.  Work with your health care provider and dietitian to develop a plan to achieve or maintain a healthy weight. Losing weight can help reduce uric acid in your blood. What foods are recommended? The items listed may not be a complete list. Talk with your dietitian about  what dietary choices are best for you. Foods low in purines Foods low in purines do not need to be limited. These include:  All fruits.  All low-purine vegetables, pickles, and olives.  Breads, pasta, rice, cornbread, and popcorn. Cake and other baked goods.  All dairy foods.  Eggs, nuts, and nut butters.  Spices and condiments, such as salt, herbs, and vinegar.  Plant oils, butter, and margarine.  Water, sugar-free soft drinks, tea, coffee, and cocoa.  Vegetable-based soups, broths, sauces, and gravies. Foods moderate in purines Foods moderate in purines should be limited to the amounts listed.   cup of asparagus, cauliflower, spinach, mushrooms, or green peas, each day.  2/3 cup uncooked oatmeal, each day.   cup dry wheat bran or wheat germ, each day.  2-3 ounces of meat or poultry, each day.  4-6 ounces of shellfish, such as crab, lobster, oysters, or shrimp, each day.  1 cup cooked beans, peas, or lentils, each day.  Soup, broths, or bouillon made from meat or fish. Limit these foods as much as possible. What foods are not recommended? The items listed may not be a complete  list. Talk with your dietitian about what dietary choices are best for you. Limit your intake of foods high in purines, including:  Beer and other alcohol.  Meat-based gravy or sauce.  Canned or fresh fish, such as: ? Anchovies, sardines, herring, and tuna. ? Mussels and scallops. ? Codfish, trout, and haddock.  Berniece Salines.  Organ meats, such as: ? Liver or kidney. ? Tripe. ? Sweetbreads (thymus gland or pancreas).  Wild Clinical biochemist.  Yeast or yeast extract supplements.  Drinks sweetened with high-fructose corn syrup. Summary  Eating a low-purine diet can help control conditions caused by too much uric acid in the body, such as gout or kidney stones.  Choose low-purine foods, limit alcohol, and limit foods high in fat.  You will learn over time which foods do or do not affect  you. If you find out that a food tends to cause your gout symptoms to flare up, avoid eating that food. This information is not intended to replace advice given to you by your health care provider. Make sure you discuss any questions you have with your health care provider. Document Revised: 12/01/2016 Document Reviewed: 02/02/2016 Elsevier Patient Education  2020 [employer].

## 2019-08-15 NOTE — Progress Notes (Signed)
Chronic Care Management Pharmacy  Name: TAVARES LEVINSON  MRN: [id number] DOB: [birth year]  Chief Complaint/ HPI  [name],  86 y.o. , male presents for their Follow-Up CCM visit with the clinical pharmacist via telephone due to COVID-19 Pandemic.  PCP : Midge Minium, MD  Encounter Diagnoses  Name Primary?  . Gout, unspecified cause, unspecified chronicity, unspecified site Yes  . Diabetes mellitus without complication ([place])   . Essential hypertension    Patient Active Problem List   Diagnosis Date Noted  . Diastolic dysfunction 27/51/7001  . Obesity (BMI 30-39.9) 02/28/2018  . B12 deficiency 02/28/2018  . Long term (current) use of anticoagulants 09/25/2016  . Chronic constipation 04/29/2015  . Soft tissue mass 04/29/2015  . Chronic venous insufficiency   . Encounter for therapeutic drug monitoring 05/28/2014  . Essential hypertension 03/07/2014  . Preventative health care 03/07/2014  . Varicose veins of lower extremities with other complications 74/94/4967  . Diabetes mellitus without complication ([place]) 59/16/3846  . Iron deficiency anemia due to chronic blood loss 08/26/2013  . Hyperlipidemia 08/13/2006  . GOUT 08/13/2006  . ATRIAL FIBRILLATION 08/13/2006   Social History   Socioeconomic History  . Marital status: Married    Spouse name: Not on file  . Number of children: 3  . Years of education: Not on file  . Highest education level: Not on file  Occupational History  . Occupation: retired  Tobacco Use  . Smoking status: Former Smoker    Types: Cigarettes  . Smokeless tobacco: Never Used  Vaping Use  . Vaping Use: Never used  Substance and Sexual Activity  . Alcohol use: No  . Drug use: No  . Sexual activity: Not on file  Other Topics Concern  . Not on file  Social History Narrative   Married, 3 daughters, 4 grandchildren.      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   Retired  2012.      Remote smoking hx: quit 55 yrs ago.  No alcohol in 55 yrs.      Social Determinants of Health   Financial Resource Strain: Low Risk   . Difficulty of Paying Living Expenses: Not very hard  Food Insecurity: No Food Insecurity  . Worried About Charity fundraiser in the Last Year: Never true  . Ran Out of Food in the Last Year: Never true  Transportation Needs: No Transportation Needs  . Lack of Transportation (Medical): No  . Lack of Transportation (Non-Medical): No  Physical Activity:   . Days of Exercise per Week:   . Minutes of Exercise per Session:   Stress: No Stress Concern Present  . Feeling of Stress : Not at all  Social Connections: Moderately Integrated  . Frequency of Communication with Friends and Family: Once a week  . Frequency of Social Gatherings with Friends and Family: Once a week  . Attends Religious Services: More than 4 times per year  . Active Member of Clubs or Organizations: Yes  . Attends Archivist Meetings: More than 4 times per year  . Marital Status: Married   Family History  Problem Relation Age of Onset  . Alcohol abuse Father   . Diabetes Father   . Obesity Brother   . Obesity Daughter        S/P gastric bypass   Allergies  Allergen Reactions  . Influenza Vac Split [Flu Virus Vaccine] Nausea And Vomiting   Outpatient  Encounter Medications as of 08/15/2019  Medication Sig  . allopurinol (ZYLOPRIM) 300 MG tablet TAKE 1 TABLET (300 MG TOTAL) BY MOUTH DAILY.  . bisacodyl (DULCOLAX) 5 MG EC tablet Take 5 mg by mouth daily as needed for moderate constipation. (Patient not taking: Reported on 07/08/2019)  . Calcium Carbonate-Vit D-Min (CALCIUM 1200 PO) Take by mouth.  . ferrous sulfate 325 (65 FE) MG tablet Take 325 mg by mouth daily.   . furosemide (LASIX) 20 MG tablet TAKE 1 TABLET BY MOUTH EVERY DAY (Patient taking differently: 20 mg. TAKE 2 TABLETs BY MOUTH EVERY DAY)  . HYDROcodone-acetaminophen (NORCO/VICODIN) 5-325 MG  tablet Take 1 tablet by mouth every 4 (four) hours as needed for moderate pain. (Patient not taking: Reported on 03/21/2019)  . lisinopril (ZESTRIL) 2.5 MG tablet TAKE 1 TABLET BY MOUTH EVERY DAY  . metFORMIN (GLUCOPHAGE) 500 MG tablet TAKE 1 TABLET BY MOUTH EVERY DAY  . metoprolol succinate (TOPROL-XL) 50 MG 24 hr tablet TAKE 1 TABLET BY MOUTH EVERY DAY  . simvastatin (ZOCOR) 20 MG tablet TAKE 1 TABLET BY MOUTH EVERY DAY  . vitamin B-12 (CYANOCOBALAMIN) 500 MCG tablet Take 500 mcg by mouth daily.  Marland Kitchen warfarin (COUMADIN) 5 MG tablet TAKE AS DIRECTED BY ANTICOAGULATION CLINIC   No facility-administered encounter medications on file as of 08/15/2019.   Current Diagnosis/Assessment: Goals Addressed            This Visit's Progress   . PharmD Care Plan   On track    CARE PLAN ENTRY  Current Barriers:  . Chronic Disease Management support, education, and care coordination needs related to Hypertension, Hyperlipidemia, Atrial Fibrillation, Gout.   Atrial Fibrillation . Pharmacist Clinical Goal(s) o Over the next 90 days, patient will work with PharmD and providers to maintain HR <100 and INR 2-3. Marland Kitchen Current regimen:  o Metoprolol succinate 50 mg daily (heart rate control) o Warfarin 5 mg as directed by warfarin clinic (stroke prevention) . Interventions: o Continue current management . Patient self care activities - Over the next 90 days, patient will: o Continue current management  Hypertension / Peripheral Edema . Pharmacist Clinical Goal(s): o Over the next 90 days, patient will work with PharmD and providers to maintain BP goal <140/90 . Current regimen:  o Lisinopril 2.5 mg daily  o Furosemide 20 mg tab - two tablets daily . Interventions: o Continue current management . Patient self care activities - Over the next 90 days, patient will: o Check BP once every 1-2 weeks, document, and provide at future appointments o Ensure daily salt intake < 2300  mg/day  Hyperlipidemia . Pharmacist Clinical Goal(s): o Over the next 90 days, patient will work with PharmD and providers to maintain LDL goal < 100 . Current regimen:  o Simvastatin 20 mg daily  . Interventions: o Continue current management . Patient self care activities - Over the next 90 days, patient will: o Continue current management  Gout . Pharmacist Clinical Goal(s) o Over the next 90 days, patient will work with PharmD and providers to prevent recurrence of gout . Current regimen:  o Allopurinol 300 mg daily  . Interventions: o Continue current management . Patient self care activities - Over the next 90 days, patient will: o Continue current management  Diabetes . Pharmacist Clinical Goal(s): o Over the next 90 days, patient will work with PharmD and providers to maintain A1c goal <7% . Current regimen:  o Metformin 500 mg once daily  . Interventions: o Continue  current management . Patient self care activities - Over the next 90 days, patient will: o Check blood sugar if directed, document, and provide at future appointments o Contact provider with any episodes of hypoglycemia  Medication management . Pharmacist Clinical Goal(s): o Over the next 90 days, patient will work with PharmD and providers to achieve optimal medication adherence . Current pharmacy: [facility] . Interventions o Comprehensive medication review performed. o Continue current medication management strategy . Patient self care activities - Over the next 90 days, patient will: o Focus on medication adherence by taking medications as prescribed.  o Report any questions or concerns to PharmD and/or provider(s).  Initial goal documentation.      Hypertension / Peripheral Edema.   BP Readings from Last 3 Encounters:  03/21/19 (!) 124/58  03/14/19 128/60  02/24/19 130/76   BP today is:  N/a. Vitals not provided. Patient checks BP at home several times per month. Denies any dizziness.  Encouraged to schedule routine physical.   Patient is currently controlled on the following medications:   Lisinopril 2.5 mg once daily  Lasix 40 mg daily  Plan  Continue current medications.    Diabetes   Recent Relevant Labs: Lab Results  Component Value Date/Time   HGBA1C 6.0 02/24/2019 02:32 PM   HGBA1C 6.0 10/31/2018 09:53 AM   HGBA1C 6.6 02/20/2017 12:00 AM   MICROALBUR 0.8 02/26/2014 11:28 AM   MICROALBUR 0.7 02/06/2006 11:39 AM    A1c 6.0% 02/2019 and 6.0% 10/2018. Denies any side effects at this time.  Patient is currently controlled on the following medications:   Metformin 500 mg once daily   We discussed: diet.   Plan  Continue current management.    AFIB   Pulse Readings from Last 3 Encounters:  03/21/19 74  03/14/19 65  02/24/19 78   Patient is currently rate controlled.  Metoprolol succinate 50 mg once daily   Lab Results  Component Value Date   INR 2.9 07/28/2019   INR 3.4 (A) 06/30/2019   INR 1.9 (A) 06/04/2019   Warfarin for stroke prevention. Denies any abnormal bruising, bleeding from nose or gums or blood in urine or stool. Currently taking:  Warfarin 5 mg as directed by clinic   Plan  Continue current medications  Hyperlipidemia   Lipid Panel     Component Value Date/Time   CHOL 105 10/31/2018 0953   TRIG 55.0 10/31/2018 0953   HDL 42.90 10/31/2018 0953   LDLCALC 52 10/31/2018 0953    86 y.o.LDL calc 52 10/2018, total chol 105 10/2018. Denies any muscle or abdominal pain or n/v. Patient is currently controlled on the following medications:  . Simvastatin 20 mg daily  Plan  Continue current medications   Gout   Kidney Function Lab Results  Component Value Date/Time   CREATININE 1.79 (H) 03/05/2019 01:55 PM   CREATININE 2.05 (H) 02/24/2019 02:32 PM   CREATININE 1.71 (H) 06/21/2018 02:36 PM   CREATININE 1.06 08/07/2016 10:29 AM   GFR 36.19 (L) 03/05/2019 01:55 PM   GFRNONAA 50 (L) 01/23/2016 05:15 AM   GFRAA 58  (L) 01/23/2016 05:15 AM   K 4.5 03/05/2019 01:55 PM   K 4.4 02/24/2019 02:32 PM   Denies recent attack. Some difficulty differentiating between edema and gout. Patient is currently controlled on the following medications:  . Allopurinol 300 mg once daily   We discussed: low purine diet.   Plan  Continue current medications.  Discuss possible dose reduction to 100 mg daily  with PCP.  Vaccines   Immunization History  Administered Date(s) Administered  . PFIZER SARS-COV-2 Vaccination 03/25/2019, 04/22/2019  . Pneumococcal Conjugate-13 02/26/2014  . Pneumococcal Polysaccharide-23 01/02/2006, 12/20/2015  . Td 01/03/2000  . Tdap 02/26/2014   Reviewed patient's vaccination history.    Plan  Recommended patient receive Shingrix vaccine in pharmacy.   Medication Management / Care Coordination   Receives prescription medications from:  [id number] [place] [place] - [address] [place] [address] Phone: [phone] Fax: [phone]   Plan  Continue current medication management strategy.  Follow up: 3 month phone visit.  Future Appointments  Date Time Provider Intercourse  08/25/2019  2:15 PM [facility]AD[facility]  11/14/2019  1:00 PM [facility] C[facility] PHARMACIST [facility]   Pt needing to schedule physical but declined scheduling at this time.  ___________________________  Madelin Rear, Pharm.D., BCGP Clinical Pharmacist [facility] [phone]

## 2019-08-19 ENCOUNTER — Other Ambulatory Visit: Payer: Self-pay | Admitting: General Practice

## 2019-08-19 MED ORDER — SIMVASTATIN 20 MG PO TABS: 20.0000 mg | ORAL_TABLET | Freq: Every day | ORAL | 1 refills | Status: DC

## 2019-08-20 ENCOUNTER — Other Ambulatory Visit: Payer: Self-pay | Admitting: Family Medicine

## 2019-08-25 ENCOUNTER — Ambulatory Visit: Payer: Medicare HMO | Admitting: General Practice

## 2019-08-29 ENCOUNTER — Other Ambulatory Visit: Payer: Self-pay | Admitting: Family Medicine

## 2019-08-29 DIAGNOSIS — R609 Edema, unspecified: Secondary | ICD-10-CM

## 2019-09-01 ENCOUNTER — Other Ambulatory Visit: Payer: Self-pay

## 2019-09-01 ENCOUNTER — Ambulatory Visit (INDEPENDENT_AMBULATORY_CARE_PROVIDER_SITE_OTHER): Payer: Medicare HMO | Admitting: General Practice

## 2019-09-01 DIAGNOSIS — Z7901 Long term (current) use of anticoagulants: Secondary | ICD-10-CM

## 2019-09-01 DIAGNOSIS — I4891 Unspecified atrial fibrillation: Secondary | ICD-10-CM | POA: Diagnosis not present

## 2019-09-01 LAB — POCT INR: INR: 3.1 — AB (ref 2.0–3.0)

## 2019-09-01 NOTE — Patient Instructions (Addendum)
Pre visit review using our clinic review tool, if applicable. No additional management support is needed unless otherwise documented below in the visit note.  Hold dosage today and then change dosage and take 1 tablet daily except 1/2 tablet on Mondays and Thursdays.   Re-check in 4 weeks.

## 2019-09-17 ENCOUNTER — Encounter: Payer: Self-pay | Admitting: General Practice

## 2019-09-29 ENCOUNTER — Other Ambulatory Visit: Payer: Self-pay

## 2019-09-29 ENCOUNTER — Ambulatory Visit (INDEPENDENT_AMBULATORY_CARE_PROVIDER_SITE_OTHER): Payer: Medicare HMO | Admitting: General Practice

## 2019-09-29 DIAGNOSIS — I4891 Unspecified atrial fibrillation: Secondary | ICD-10-CM | POA: Diagnosis not present

## 2019-09-29 DIAGNOSIS — Z7901 Long term (current) use of anticoagulants: Secondary | ICD-10-CM

## 2019-09-29 LAB — POCT INR: INR: 2.2 (ref 2.0–3.0)

## 2019-09-29 NOTE — Patient Instructions (Addendum)
Pre visit review using our clinic review tool, if applicable. No additional management support is needed unless otherwise documented below in the visit note.  Continue to take 1 tablet daily except 1/2 tablet on Mondays and Thursdays.   Re-check in 2 weeks.  Patient wants to get back on schedule with Mrs. Hoeffner.

## 2019-10-13 ENCOUNTER — Ambulatory Visit: Payer: Medicare HMO

## 2019-10-27 ENCOUNTER — Other Ambulatory Visit: Payer: Self-pay

## 2019-10-27 ENCOUNTER — Ambulatory Visit (INDEPENDENT_AMBULATORY_CARE_PROVIDER_SITE_OTHER): Payer: Medicare HMO | Admitting: General Practice

## 2019-10-27 DIAGNOSIS — I4891 Unspecified atrial fibrillation: Secondary | ICD-10-CM | POA: Diagnosis not present

## 2019-10-27 DIAGNOSIS — Z7901 Long term (current) use of anticoagulants: Secondary | ICD-10-CM

## 2019-10-27 LAB — POCT INR: INR: 2.1 (ref 2.0–3.0)

## 2019-10-27 NOTE — Patient Instructions (Addendum)
Pre visit review using our clinic review tool, if applicable. No additional management support is needed unless otherwise documented below in the visit note.  Continue to take 1 tablet daily except 1/2 tablet on Mondays and Thursdays.   Re-check in 6 weeks.

## 2019-11-14 ENCOUNTER — Ambulatory Visit: Payer: Medicare HMO

## 2019-11-14 NOTE — Chronic Care Management (AMB) (Signed)
  Chronic Care Management   Outreach Note   Name: [name] MRN: [id number] DOB: 1931/02/02  Referred by: Midge Minium, MD Reason for referral: Telephone Appointment with [facility] Pharmacist, Madelin Rear.   An unsuccessful telephone outreach was attempted today. The patient was referred to the pharmacist for assistance with care management and care coordination.   Telephone appointment with clinical pharmacist today (11/14/2019) at 1pm. If patient immediately returns call, transfer to [phone]. Otherwise, please provide this number so patient can reschedule visit.   Madelin Rear, Pharm.D., BCGP Clinical Pharmacist [facility] [phone]

## 2019-11-24 ENCOUNTER — Ambulatory Visit (INDEPENDENT_AMBULATORY_CARE_PROVIDER_SITE_OTHER): Payer: Medicare HMO | Admitting: General Practice

## 2019-11-24 DIAGNOSIS — I4891 Unspecified atrial fibrillation: Secondary | ICD-10-CM | POA: Diagnosis not present

## 2019-11-24 DIAGNOSIS — Z7901 Long term (current) use of anticoagulants: Secondary | ICD-10-CM

## 2019-11-24 NOTE — Patient Instructions (Incomplete)
Pre visit review using our clinic review tool, if applicable. No additional management support is needed unless otherwise documented below in the visit note. 

## 2019-12-08 ENCOUNTER — Ambulatory Visit: Payer: Medicare HMO

## 2019-12-24 ENCOUNTER — Other Ambulatory Visit: Payer: Self-pay

## 2019-12-24 ENCOUNTER — Ambulatory Visit (INDEPENDENT_AMBULATORY_CARE_PROVIDER_SITE_OTHER): Payer: Medicare HMO | Admitting: General Practice

## 2019-12-24 DIAGNOSIS — Z7901 Long term (current) use of anticoagulants: Secondary | ICD-10-CM

## 2019-12-24 DIAGNOSIS — I4891 Unspecified atrial fibrillation: Secondary | ICD-10-CM | POA: Diagnosis not present

## 2019-12-24 LAB — POCT INR: INR: 1.7 — AB (ref 2.0–3.0)

## 2019-12-24 NOTE — Patient Instructions (Addendum)
Pre visit review using our clinic review tool, if applicable. No additional management support is needed unless otherwise documented below in the visit note.  Take 1 1/2 tablets today (12/22) and then change dosage and take 1 tablet daily except 1/2 tablet on Mondays only.  Re-check in 6 weeks.

## 2020-01-13 ENCOUNTER — Telehealth: Payer: Self-pay

## 2020-01-13 NOTE — Progress Notes (Signed)
Chronic Care Management Pharmacy Assistant   Name: [name]  MRN: [id number] DOB: September 15, 1931  Reason for Encounter: Disease State  PCP : Midge Minium, MD  Allergies:   Allergies  Allergen Reactions  . Influenza Vac Split [Influenza Virus Vaccine] Nausea And Vomiting    Medications: Outpatient Encounter Medications as of 01/13/2020  Medication Sig  . allopurinol (ZYLOPRIM) 300 MG tablet TAKE 1 TABLET (300 MG TOTAL) BY MOUTH DAILY.  . bisacodyl (DULCOLAX) 5 MG EC tablet Take 5 mg by mouth daily as needed for moderate constipation. (Patient not taking: Reported on 07/08/2019)  . Calcium Carbonate-Vit D-Min (CALCIUM 1200 PO) Take by mouth.  . ferrous sulfate 325 (65 FE) MG tablet Take 325 mg by mouth daily.   . furosemide (LASIX) 20 MG tablet TAKE 1 TO 2 TABLETS DAILY FOR SWELLING  . HYDROcodone-acetaminophen (NORCO/VICODIN) 5-325 MG tablet Take 1 tablet by mouth every 4 (four) hours as needed for moderate pain. (Patient not taking: Reported on 03/21/2019)  . lisinopril (ZESTRIL) 2.5 MG tablet TAKE 1 TABLET BY MOUTH EVERY DAY  . metFORMIN (GLUCOPHAGE) 500 MG tablet TAKE 1 TABLET BY MOUTH EVERY DAY  . metoprolol succinate (TOPROL-XL) 50 MG 24 hr tablet TAKE 1 TABLET BY MOUTH EVERY DAY  . simvastatin (ZOCOR) 20 MG tablet Take 1 tablet (20 mg total) by mouth daily.  . vitamin B-12 (CYANOCOBALAMIN) 500 MCG tablet Take 500 mcg by mouth daily.  Marland Kitchen warfarin (COUMADIN) 5 MG tablet TAKE AS DIRECTED BY ANTICOAGULATION CLINIC   No facility-administered encounter medications on file as of 01/13/2020.    Current Diagnosis: Patient Active Problem List   Diagnosis Date Noted  . Diastolic dysfunction 01/60/1093  . Obesity (BMI 30-39.9) 02/28/2018  . B12 deficiency 02/28/2018  . Long term (current) use of anticoagulants 09/25/2016  . Chronic constipation 04/29/2015  . Soft tissue mass 04/29/2015  . Chronic venous insufficiency   . Encounter for therapeutic drug monitoring 05/28/2014   . Essential hypertension 03/07/2014  . Preventative health care 03/07/2014  . Varicose veins of lower extremities with other complications 23/55/7322  . Diabetes mellitus without complication ([place]) 02/54/2706  . Iron deficiency anemia due to chronic blood loss 08/26/2013  . Hyperlipidemia 08/13/2006  . GOUT 08/13/2006  . ATRIAL FIBRILLATION 08/13/2006    Reviewed chart prior to disease state call. Spoke with patient regarding BP  Recent Office Vitals: BP Readings from Last 3 Encounters:  03/21/19 (!) 124/58  03/14/19 128/60  02/24/19 130/76   Pulse Readings from Last 3 Encounters:  03/21/19 74  03/14/19 65  02/24/19 78    Wt Readings from Last 3 Encounters:  07/08/19 215 lb (97.5 kg)  03/21/19 221 lb (100.2 kg)  03/14/19 220 lb (99.8 kg)     Kidney Function Lab Results  Component Value Date/Time   CREATININE 1.9 (A) 08/01/2019 12:00 AM   CREATININE 1.79 (H) 03/05/2019 01:55 PM   CREATININE 2.05 (H) 02/24/2019 02:32 PM   CREATININE 1.71 (H) 06/21/2018 02:36 PM   CREATININE 1.06 08/07/2016 10:29 AM   GFR 36.19 (L) 03/05/2019 01:55 PM   GFRNONAA 34 08/01/2019 12:00 AM   GFRAA 58 (L) 01/23/2016 05:15 AM    BMP Latest Ref Rng & Units 08/01/2019 03/05/2019 02/24/2019  Glucose 70 - 99 mg/dL - 92 125(H)  BUN 4 - 21 31(A) 28(H) 23  Creatinine 0.6 - 1.3 1.9(A) 1.79(H) 2.05(H)  BUN/Creat Ratio 6 - 22 (calc) - - -  Sodium 137 - 147 138 136 139  Potassium 3.4 - 5.3 4.6 4.5 4.4  Chloride 99 - 108 104 101 103  CO2 13 - 22 32(A) 27 29  Calcium 8.7 - 10.7 9.6 9.5 9.3     . Current antihypertensive regimen:             warfarin (COUMADIN) 5 MG tablet            metoprolol succinate (TOPROL-XL) 50 MG 24 hr tablet            lisinopril (ZESTRIL) 2.5 MG tablet . How often are you checking your Blood Pressure? Patient stated he does not check blood pressure at home . Blood pressure only gets checked when he has an in office visit . Current home BP readings: only in the  office . What recent interventions/DTPs have been made by any provider to improve Blood Pressure control since last CPP Visit: None noted . Any recent hospitalizations or ED visits since last visit with CPP? No . What diet changes have been made to improve Blood Pressure Control?  o Patient stated he has no diet changes . What exercise is being done to improve your Blood Pressure Control?  o Patient stated he does not exercise however he stays very active. Patient he looks after wife. Patient stated he does all cleaning , cooking  And running errands around the house .   Adherence Review: Is the patient currently on ACE/ARB medication? Yes Does the patient have >5 day gap between last estimated fill dates? CPP to review   Patient stated he would not like to reschedule follow up appointment at this time.   [place] ,[place] Pharmacist Assistant [phone]  Follow-Up:  Pharmacist Review

## 2020-02-02 ENCOUNTER — Other Ambulatory Visit: Payer: Self-pay | Admitting: Family Medicine

## 2020-02-04 ENCOUNTER — Other Ambulatory Visit: Payer: Self-pay | Admitting: Family Medicine

## 2020-02-04 ENCOUNTER — Ambulatory Visit (INDEPENDENT_AMBULATORY_CARE_PROVIDER_SITE_OTHER): Payer: Medicare HMO | Admitting: General Practice

## 2020-02-04 ENCOUNTER — Other Ambulatory Visit: Payer: Self-pay

## 2020-02-04 ENCOUNTER — Ambulatory Visit: Payer: Medicare HMO

## 2020-02-04 DIAGNOSIS — I4891 Unspecified atrial fibrillation: Secondary | ICD-10-CM | POA: Diagnosis not present

## 2020-02-04 DIAGNOSIS — Z7901 Long term (current) use of anticoagulants: Secondary | ICD-10-CM

## 2020-02-04 LAB — POCT INR: INR: 3.2 — AB (ref 2.0–3.0)

## 2020-02-04 NOTE — Progress Notes (Signed)
I have reviewed the results and agree with this plan   

## 2020-02-04 NOTE — Patient Instructions (Signed)
Pre visit review using our clinic review tool, if applicable. No additional management support is needed unless otherwise documented below in the visit note.  Hold dosage today and then continue to take 1 tablet daily except 1/2 tablet on Mondays only.  Re-check in 4 weeks.

## 2020-02-20 ENCOUNTER — Other Ambulatory Visit: Payer: Self-pay | Admitting: Family Medicine

## 2020-02-20 NOTE — Telephone Encounter (Signed)
Please advise Last OV 03/21/19, no future visit scheduled

## 2020-02-23 ENCOUNTER — Other Ambulatory Visit: Payer: Self-pay | Admitting: Family Medicine

## 2020-02-26 ENCOUNTER — Other Ambulatory Visit: Payer: Self-pay | Admitting: Family Medicine

## 2020-03-03 ENCOUNTER — Other Ambulatory Visit: Payer: Self-pay

## 2020-03-03 ENCOUNTER — Ambulatory Visit (INDEPENDENT_AMBULATORY_CARE_PROVIDER_SITE_OTHER): Payer: Medicare HMO | Admitting: General Practice

## 2020-03-03 DIAGNOSIS — I4891 Unspecified atrial fibrillation: Secondary | ICD-10-CM | POA: Diagnosis not present

## 2020-03-03 DIAGNOSIS — Z7901 Long term (current) use of anticoagulants: Secondary | ICD-10-CM

## 2020-03-03 LAB — POCT INR: INR: 2.9 (ref 2.0–3.0)

## 2020-03-03 NOTE — Progress Notes (Signed)
I have reviewed the results and agree with this plan   

## 2020-03-03 NOTE — Patient Instructions (Signed)
Pre visit review using our clinic review tool, if applicable. No additional management support is needed unless otherwise documented below in the visit note.  Continue to take 1 tablet daily except 1/2 tablet on Mondays only.  Recheck in 6 weeks.   

## 2020-03-10 ENCOUNTER — Telehealth: Payer: Self-pay

## 2020-03-10 NOTE — Chronic Care Management (AMB) (Addendum)
Chronic Care Management Pharmacy Assistant   Name: [name]  MRN: [id number] DOB: 07/30/1931  Reason for Encounter: Disease State/Hypertension Adherence Call  Recent office visits:  02/04/2020 Anti-coag visit; INR 3.2, do not take your warfarin dose today and then continue to take 1 tablet daily except 1/2 tablet on Mondays only.  03/03/2020 Anti-coag visit; INR 2.9, continue to take 1 tablet daily except 1/2 tablet on Mondays only.   Recent consult visits:  [facility] visits:  None in previous 6 months  Medications: Outpatient Encounter Medications as of 03/10/2020  Medication Sig   allopurinol (ZYLOPRIM) 300 MG tablet TAKE 1 TABLET (300 MG TOTAL) BY MOUTH DAILY.   bisacodyl (DULCOLAX) 5 MG EC tablet Take 5 mg by mouth daily as needed for moderate constipation. (Patient not taking: Reported on 07/08/2019)   Calcium Carbonate-Vit D-Min (CALCIUM 1200 PO) Take by mouth.   ferrous sulfate 325 (65 FE) MG tablet Take 325 mg by mouth daily.    furosemide (LASIX) 20 MG tablet TAKE 1 TO 2 TABLETS DAILY FOR SWELLING   HYDROcodone-acetaminophen (NORCO/VICODIN) 5-325 MG tablet Take 1 tablet by mouth every 4 (four) hours as needed for moderate pain. (Patient not taking: Reported on 03/21/2019)   lisinopril (ZESTRIL) 2.5 MG tablet TAKE 1 TABLET BY MOUTH EVERY DAY   metFORMIN (GLUCOPHAGE) 500 MG tablet TAKE 1 TABLET BY MOUTH EVERY DAY   metoprolol succinate (TOPROL-XL) 50 MG 24 hr tablet TAKE 1 TABLET BY MOUTH EVERY DAY   simvastatin (ZOCOR) 20 MG tablet TAKE 1 TABLET BY MOUTH EVERY DAY   vitamin B-12 (CYANOCOBALAMIN) 500 MCG tablet Take 500 mcg by mouth daily.   warfarin (COUMADIN) 5 MG tablet TAKE AS DIRECTED BY ANTICOAGULATION CLINIC   No facility-administered encounter medications on file as of 03/10/2020.   Reviewed chart prior to disease state call. Spoke with patient regarding BP  Recent Office Vitals: BP Readings from Last 3 Encounters:  03/21/19 (!) 124/58  03/14/19 128/60   02/24/19 130/76   Pulse Readings from Last 3 Encounters:  03/21/19 74  03/14/19 65  02/24/19 78    Wt Readings from Last 3 Encounters:  07/08/19 215 lb (97.5 kg)  03/21/19 221 lb (100.2 kg)  03/14/19 220 lb (99.8 kg)     Kidney Function Lab Results  Component Value Date/Time   CREATININE 1.9 (A) 08/01/2019 12:00 AM   CREATININE 1.79 (H) 03/05/2019 01:55 PM   CREATININE 2.05 (H) 02/24/2019 02:32 PM   CREATININE 1.71 (H) 06/21/2018 02:36 PM   CREATININE 1.06 08/07/2016 10:29 AM   GFR 36.19 (L) 03/05/2019 01:55 PM   GFRNONAA 34 08/01/2019 12:00 AM   GFRAA 58 (L) 01/23/2016 05:15 AM    BMP Latest Ref Rng & Units 08/01/2019 03/05/2019 02/24/2019  Glucose 70 - 99 mg/dL - 92 125(H)  BUN 4 - 21 31(A) 28(H) 23  Creatinine 0.6 - 1.3 1.9(A) 1.79(H) 2.05(H)  BUN/Creat Ratio 6 - 22 (calc) - - -  Sodium 137 - 147 138 136 139  Potassium 3.4 - 5.3 4.6 4.5 4.4  Chloride 99 - 108 104 101 103  CO2 13 - 22 32(A) 27 29  Calcium 8.7 - 10.7 9.6 9.5 9.3    Current antihypertensive regimen:  Furosemide 20 mg tablet; 1-2 tablet daily Lisinopril 2.5 mg tablet daily Metoprolol Succinate 50 mg daily  How often are you checking your Blood Pressure? Patient states he currently does not check his blood pressure at home.   Current home BP readings:  n/a  What recent interventions/DTPs have been made by any provider to improve Blood Pressure control since last CPP Visit: none noted, patient states he is currently taking his blood pressure medications as instructed.  Any recent hospitalizations or ED visits since last visit with CPP? No   What diet changes have been made to improve Blood Pressure Control?  Patient states he does not diet at all. He states he eats what he wants to.  What exercise is being done to improve your Blood Pressure Control?  Patient states he does not exercise.  Adherence Review: Is the patient currently on ACE/ARB medication? Yes Does the patient have >5 day gap between  last estimated fill dates? Yes   Star Rating Drugs: Lisinopril 2.5 mg last filled 11/07/2019 90 DS Simvastatin last filled 11/24/2019 90 DS Metformin 500 mg last filled 10/24/2019 90 DS (sent to pharmacy 02/04/2020)  Patient states he has not ran out of any of his medications.  Patient declines to schedule a follow-up appointment at this time. He states he is busy a lot with taking care of his wife at home.  April D Calhoun, [place] Pharmacist Assistant [phone]

## 2020-03-16 ENCOUNTER — Other Ambulatory Visit: Payer: Self-pay | Admitting: Family Medicine

## 2020-03-16 DIAGNOSIS — R609 Edema, unspecified: Secondary | ICD-10-CM

## 2020-03-17 ENCOUNTER — Other Ambulatory Visit: Payer: Self-pay | Admitting: Family Medicine

## 2020-03-17 NOTE — Telephone Encounter (Signed)
Patient has not been seen since 03/21/19. He has been seen for chronic care management. Ok to fill?

## 2020-04-10 ENCOUNTER — Other Ambulatory Visit: Payer: Self-pay | Admitting: Family Medicine

## 2020-04-14 ENCOUNTER — Other Ambulatory Visit: Payer: Self-pay

## 2020-04-14 ENCOUNTER — Ambulatory Visit (INDEPENDENT_AMBULATORY_CARE_PROVIDER_SITE_OTHER): Payer: Medicare HMO | Admitting: General Practice

## 2020-04-14 DIAGNOSIS — I4891 Unspecified atrial fibrillation: Secondary | ICD-10-CM

## 2020-04-14 DIAGNOSIS — Z7901 Long term (current) use of anticoagulants: Secondary | ICD-10-CM

## 2020-04-14 LAB — POCT INR: INR: 3.6 — AB (ref 2.0–3.0)

## 2020-04-14 NOTE — Progress Notes (Signed)
I have reviewed the results and agree with this plan   

## 2020-04-14 NOTE — Patient Instructions (Signed)
Pre visit review using our clinic review tool, if applicable. No additional management support is needed unless otherwise documented below in the visit note.  Skip dosage today (4/13) and then continue to take 1 tablet daily except 1/2 tablet on Mondays only.  Re-check in 5 weeks.

## 2020-04-27 ENCOUNTER — Telehealth: Payer: Self-pay | Admitting: Family Medicine

## 2020-04-27 NOTE — Telephone Encounter (Signed)
Pt called in asking for a referral to a wound care specialist. He states that he has an wounds on his arms that aren't healing. He has been treating it with otc meds and bandages but it isn't helping.   Pt can be reached at the home #

## 2020-04-27 NOTE — Telephone Encounter (Signed)
Pt requesting wound care referral, would you like OV first to assess?

## 2020-04-28 ENCOUNTER — Other Ambulatory Visit: Payer: Self-pay

## 2020-04-28 DIAGNOSIS — S51801D Unspecified open wound of right forearm, subsequent encounter: Secondary | ICD-10-CM

## 2020-04-28 NOTE — Telephone Encounter (Signed)
Called and informed patient that a referral to wound care has been placed for him. Patient understood.

## 2020-04-28 NOTE — Telephone Encounter (Signed)
Ok for referral to wound care for skin tears on arm- pt was seen in March for this

## 2020-05-03 ENCOUNTER — Telehealth: Payer: Self-pay | Admitting: Family Medicine

## 2020-05-03 ENCOUNTER — Other Ambulatory Visit: Payer: Self-pay

## 2020-05-03 MED ORDER — LISINOPRIL 2.5 MG PO TABS: 2.5000 mg | ORAL_TABLET | Freq: Every day | ORAL | 0 refills | Status: DC

## 2020-05-03 NOTE — Telephone Encounter (Signed)
Pt called in stating that he is out of the Lisinopril, pt is needs a refill sent to the [facility] in [place]

## 2020-05-03 NOTE — Telephone Encounter (Signed)
Medication refilled - patient notified.

## 2020-05-03 NOTE — Telephone Encounter (Signed)
Please refill his Lisinopril and encourage him to schedule a follow up appt at his convenience

## 2020-05-03 NOTE — Telephone Encounter (Signed)
Called patient to inform him that he would need an appointment. He states he was instructed to schedule an appointment after he was finished with the burn center. Patient did not schedule an appointment. He states it's okay if you fill the medication or don't fill it.

## 2020-05-04 ENCOUNTER — Ambulatory Visit: Payer: Medicare HMO | Admitting: Physician Assistant

## 2020-05-04 ENCOUNTER — Encounter (HOSPITAL_BASED_OUTPATIENT_CLINIC_OR_DEPARTMENT_OTHER): Payer: Medicare HMO | Attending: Internal Medicine | Admitting: Internal Medicine

## 2020-05-04 ENCOUNTER — Other Ambulatory Visit: Payer: Self-pay

## 2020-05-04 DIAGNOSIS — Z87891 Personal history of nicotine dependence: Secondary | ICD-10-CM | POA: Insufficient documentation

## 2020-05-04 DIAGNOSIS — S51802D Unspecified open wound of left forearm, subsequent encounter: Secondary | ICD-10-CM | POA: Diagnosis not present

## 2020-05-04 DIAGNOSIS — I4891 Unspecified atrial fibrillation: Secondary | ICD-10-CM | POA: Insufficient documentation

## 2020-05-04 DIAGNOSIS — E1122 Type 2 diabetes mellitus with diabetic chronic kidney disease: Secondary | ICD-10-CM | POA: Insufficient documentation

## 2020-05-04 DIAGNOSIS — I872 Venous insufficiency (chronic) (peripheral): Secondary | ICD-10-CM | POA: Diagnosis not present

## 2020-05-04 DIAGNOSIS — S51802A Unspecified open wound of left forearm, initial encounter: Secondary | ICD-10-CM

## 2020-05-04 DIAGNOSIS — I129 Hypertensive chronic kidney disease with stage 1 through stage 4 chronic kidney disease, or unspecified chronic kidney disease: Secondary | ICD-10-CM | POA: Insufficient documentation

## 2020-05-04 DIAGNOSIS — W228XXD Striking against or struck by other objects, subsequent encounter: Secondary | ICD-10-CM | POA: Insufficient documentation

## 2020-05-04 DIAGNOSIS — S51801A Unspecified open wound of right forearm, initial encounter: Secondary | ICD-10-CM | POA: Diagnosis not present

## 2020-05-04 DIAGNOSIS — Z7901 Long term (current) use of anticoagulants: Secondary | ICD-10-CM | POA: Diagnosis not present

## 2020-05-04 DIAGNOSIS — E119 Type 2 diabetes mellitus without complications: Secondary | ICD-10-CM | POA: Diagnosis not present

## 2020-05-04 DIAGNOSIS — S61501A Unspecified open wound of right wrist, initial encounter: Secondary | ICD-10-CM | POA: Diagnosis not present

## 2020-05-04 DIAGNOSIS — N182 Chronic kidney disease, stage 2 (mild): Secondary | ICD-10-CM | POA: Insufficient documentation

## 2020-05-05 NOTE — Progress Notes (Signed)
[name] ([id number]) Visit Report for 05/04/2020 Abuse/Suicide Risk Screen Details Patient Name: Date of Service: [name], [place] LD O. 05/04/2020 9:30 A M Medical Record Number: [id number] Patient Account Number: [id number] Date of Birth/Sex: Treating RN: June 09, 1931 (87 y.o. Male) Deon Pilling Primary Care Enedina Pair: [name] Other Clinician: Referring Adrine Hayworth: Treating Kasem Mozer/Extender: Judeth Porch in Treatment: 0 Abuse/Suicide Risk Screen Items Answer ABUSE RISK SCREEN: Has anyone close to you tried to hurt or harm you recentlyo No Do you feel uncomfortable with anyone in your familyo No Has anyone forced you do things that you didnt want to doo No Electronic Signature(s) Signed: 05/05/2020 6:54:10 PM By: Deon Pilling Entered By: Deon Pilling on 05/04/2020 09:54:33 -------------------------------------------------------------------------------- Activities of Daily Living Details Patient Name: Date of Service: [name], [name] [place] LD O. 05/04/2020 9:30 A M Medical Record Number: [id number] Patient Account Number: [id number] Date of Birth/Sex: Treating RN: 30-Aug-1931 (87 y.o. Male) Deon Pilling Primary Care Emrick Hensch: [name] Other Clinician: Referring Tyller Bowlby: Treating Randy Castrejon/Extender: Judeth Porch in Treatment: 0 Activities of Daily Living Items Answer Activities of Daily Living (Please select one for each item) Drive Automobile Completely Able T Medications ake Completely Able Use T elephone Completely Able Care for Appearance Completely Able Use T oilet Completely Able Bath / Shower Completely Able Dress Self Completely Able Feed Self Completely Able Walk Completely Able Get In / Out Bed Completely Able Housework Completely Able Prepare Meals Completely [place] for Self Completely Able Electronic Signature(s) Signed: 05/05/2020 6:54:10 PM By: Deon Pilling Entered By: Deon Pilling on 05/04/2020 09:54:48 -------------------------------------------------------------------------------- Education Screening Details Patient Name: Date of Service: [name], Stephen [place] LD O. 05/04/2020 9:30 A M Medical Record Number: [id number] Patient Account Number: [id number] Date of Birth/Sex: Treating RN: 1931-04-12 (87 y.o. Male) Deon Pilling Primary Care [name]: [name] Other Clinician: Referring Gayanne Prescott: Treating Dayson Aboud/Extender: Judeth Porch in Treatment: 0 Primary Learner Assessed: Patient Learning Preferences/Education Level/Primary Language Learning Preference: Explanation, Demonstration, Printed Material Highest Education Level: College or Above Preferred Language: English Cognitive Barrier Language Barrier: No Translator Needed: No Memory Deficit: No Emotional Barrier: No Cultural/Religious Beliefs Affecting Medical Care: No Physical Barrier Impaired Vision: Yes Glasses Impaired Hearing: Yes Hearing Aid, does not wear. Decreased Hand dexterity: No Knowledge/Comprehension Knowledge Level: High Comprehension Level: High Ability to understand written instructions: High Ability to understand verbal instructions: High Motivation Anxiety Level: Calm Cooperation: Cooperative Education Importance: Acknowledges Need Interest in Health Problems: Asks Questions Perception: Coherent Willingness to Engage in Self-Management High Activities: Readiness to Engage in Self-Management High Activities: Electronic Signature(s) Signed: 05/05/2020 6:54:10 PM By: Deon Pilling Entered By: Deon Pilling on 05/04/2020 09:58:17 -------------------------------------------------------------------------------- Fall Risk Assessment Details Patient Name: Date of Service: [name], Stephen NA LD O. 05/04/2020 9:30 A M Medical Record Number: [id number] Patient Account Number: [id number] Date of Birth/Sex: Treating RN: [birth year]  (87 y.o. Male) Deon Pilling Primary Care Katieann Hungate: [name] Other Clinician: Referring Brent Taillon: Treating Mclane Arora/Extender: Judeth Porch in Treatment: 0 Fall Risk Assessment Items Have you had 2 or more falls in the last 12 monthso 0 Yes Have you had any fall that resulted in injury in the last 12 monthso 0 No FALLS RISK SCREEN History of falling - immediate or within 3 months 0 No Secondary diagnosis (Do you have 2 or more medical diagnoseso) 0 No Ambulatory aid None/bed rest/wheelchair/nurse 0 No Crutches/cane/walker 15 Yes Furniture 0 No Intravenous therapy Access/Saline/Heparin Lock 0 No Gait/Transferring Normal/  bed rest/ wheelchair 0 Yes Weak (short steps with or without shuffle, stooped but able to lift head while walking, may seek 0 No support from furniture) Impaired (short steps with shuffle, may have difficulty arising from chair, head down, impaired 0 No balance) Mental Status Oriented to own ability 0 Yes Electronic Signature(s) Signed: 05/05/2020 6:54:10 PM By: Deon Pilling Entered By: Deon Pilling on 05/04/2020 09:55:36 -------------------------------------------------------------------------------- Foot Assessment Details Patient Name: Date of Service: [name], Stephen NA LD O. 05/04/2020 9:30 A M Medical Record Number: [id number] Patient Account Number: [id number] Date of Birth/Sex: Treating RN: 1931/01/15 (87 y.o. Male) Deon Pilling Primary Care Jeremia Groot: [name] Other Clinician: Referring Emeterio Balke: Treating Windel Keziah/Extender: Judeth Porch in Treatment: 0 Foot Assessment Items Site Locations + = Sensation present, - = Sensation absent, C = Callus, U = Ulcer R = Redness, W = Warmth, M = Maceration, PU = Pre-ulcerative lesion F = Fissure, S = Swelling, D = Dryness Assessment Right: Left: Other Deformity: No No Prior Foot Ulcer: No No Prior Amputation: No No Charcot Joint: No  No Ambulatory Status: Ambulatory With Help Assistance Device: Cane Gait: Steady Notes no BLE wounds. Electronic Signature(s) Signed: 05/05/2020 6:54:10 PM By: Deon Pilling Entered By: Deon Pilling on 05/04/2020 09:55:57 -------------------------------------------------------------------------------- Nutrition Risk Screening Details Patient Name: Date of Service: [name], [place] LD O. 05/04/2020 9:30 A M Medical Record Number: [id number] Patient Account Number: [id number] Date of Birth/Sex: Treating RN: January 18, 1931 (87 y.o. Male) Deon Pilling Primary Care Rishabh Rinkenberger: [name] Other Clinician: Referring Karsten Vaughn: Treating Nekeshia Lenhardt/Extender: Judeth Porch in Treatment: 0 Height (in): 69 Weight (lbs): 213 Body Mass Index (BMI): 31.5 Nutrition Risk Screening Items Score Screening NUTRITION RISK SCREEN: I have an illness or condition that made me change the kind and/or amount of food I eat 2 Yes I eat fewer than two meals per day 0 No I eat few fruits and vegetables, or milk products 0 No I have three or more drinks of beer, liquor or wine almost every day 0 No I have tooth or mouth problems that make it hard for me to eat 0 No I don't always have enough money to buy the food I need 0 No I eat alone most of the time 0 No I take three or more different prescribed or over-the-counter drugs a day 1 Yes Without wanting to, I have lost or gained 10 pounds in the last six months 0 No I am not always physically able to shop, cook and/or feed myself 0 No Nutrition Protocols Good Risk Protocol Moderate Risk Protocol 0 Provide education on nutrition High Risk Proctocol Risk Level: Moderate Risk Score: 3 Electronic Signature(s) Signed: 05/05/2020 6:54:10 PM By: Deon Pilling Entered By: Deon Pilling on 05/04/2020 09:55:45

## 2020-05-06 ENCOUNTER — Ambulatory Visit: Payer: Medicare HMO | Admitting: Physician Assistant

## 2020-05-06 DIAGNOSIS — S51802A Unspecified open wound of left forearm, initial encounter: Secondary | ICD-10-CM | POA: Diagnosis not present

## 2020-05-06 DIAGNOSIS — S51801A Unspecified open wound of right forearm, initial encounter: Secondary | ICD-10-CM | POA: Diagnosis not present

## 2020-05-06 DIAGNOSIS — S61501A Unspecified open wound of right wrist, initial encounter: Secondary | ICD-10-CM | POA: Diagnosis not present

## 2020-05-06 DIAGNOSIS — E119 Type 2 diabetes mellitus without complications: Secondary | ICD-10-CM | POA: Diagnosis not present

## 2020-05-07 NOTE — Progress Notes (Signed)
TRAVANTE, KNEE ([id number]) Visit Report for 05/04/2020 Allergy List Details Patient Name: Date of Service: JEFFRIE, LOFSTROM [place] LD O. 05/04/2020 9:30 A M Medical Record Number: [id number] Patient Account Number: [id number] Date of Birth/Sex: Treating RN: [birth year] (87 y.o. Male) Deon Pilling Primary Care Allsion [place]: [name] Other Clinician: Referring Shamiracle Gorden: Treating Yarelly Kuba/Extender: Genoveva Ill Weeks in Treatment: 0 Allergies Active Allergies No Known Drug Allergies Allergy Notes Electronic Signature(s) Signed: 05/05/2020 6:54:10 PM By: Deon Pilling Entered By: Deon Pilling on 05/04/2020 09:51:57 -------------------------------------------------------------------------------- Arrival Information Details Patient Name: Date of Service: Tamala Julian, [name] LD O. 05/04/2020 9:30 A M Medical Record Number: [id number] Patient Account Number: [id number] Date of Birth/Sex: Treating RN: Jun 14, 1931 (87 y.o. Male) Deon Pilling Primary Care Shiasia Porro: [name] Other Clinician: Referring Lauretta Sallas: Treating Lenix Kidd/Extender: Judeth Porch in Treatment: 0 Visit Information Patient Arrived: Lyndel Pleasure Time: 09:48 Accompanied By: daughter Transfer Assistance: None Patient Identification Verified: Yes Secondary Verification Process Completed: Yes Patient Requires Transmission-Based Precautions: No Patient Has Alerts: Yes Patient Alerts: Patient on Blood Thinner Electronic Signature(s) Signed: 05/05/2020 6:54:10 PM By: Deon Pilling Entered By: Deon Pilling on 05/04/2020 09:51:45 -------------------------------------------------------------------------------- Clinic Level of Care Assessment Details Patient Name: Date of Service: KARLA, VINES [place] LD O. 05/04/2020 9:30 A M Medical Record Number: [id number] Patient Account Number: [id number] Date of Birth/Sex: Treating RN: 02/08/1931 (87 y.o. Male) Deon Pilling Primary Care Zahid Carneiro: [name] Other Clinician: Referring Rowan Pollman: Treating Katty Fretwell/Extender: Judeth Porch in Treatment: 0 Clinic Level of Care Assessment Items TOOL 1 Quantity Score X- 1 0 Use when EandM and Procedure is performed on INITIAL visit ASSESSMENTS - Nursing Assessment / Reassessment X- 1 20 General Physical Exam (combine w/ comprehensive assessment (listed just below) when performed on new pt. evals) X- 1 25 Comprehensive Assessment (HX, ROS, Risk Assessments, Wounds Hx, etc.) ASSESSMENTS - Wound and Skin Assessment / Reassessment X- 1 10 Dermatologic / Skin Assessment (not related to wound area) ASSESSMENTS - Ostomy and/or Continence Assessment and Care _0  - 0 Incontinence Assessment and Management _1  - 0 Ostomy Care Assessment and Management (repouching, etc.) PROCESS - Coordination of Care _2  - 0 Simple Patient / Family Education for ongoing care X- 1 20 Complex (extensive) Patient / Family Education for ongoing care X- 1 10 Staff obtains Programmer, systems, Records, T Results / Process Orders est _3  - 0 Staff telephones HHA, Nursing Homes / Clarify orders / etc _4  - 0 Routine Transfer to another Facility (non-emergent condition) _5  - 0 Routine Hospital Admission (non-emergent condition) X- 1 15 New Admissions / Biomedical engineer / Ordering NPWT Apligraf, etc. , _6  - 0 Emergency Hospital Admission (emergent condition) PROCESS - Special Needs _7  - 0 Pediatric / Minor Patient Management _8  - 0 Isolation Patient Management _9  - 0 Hearing / Language / Visual special needs _10  - 0 Assessment of Community assistance (transportation, D/C planning, etc.) _11  - 0 Additional assistance / Altered mentation _12  - 0 Support Surface(s) Assessment (bed, cushion, seat, etc.) INTERVENTIONS - Miscellaneous _13  - 0 External ear exam _14  - 0 Patient Transfer (multiple staff / Civil Service fast streamer / Similar devices) _15  - 0 Simple Staple / Suture removal (25 or less) _16   - 0 Complex Staple / Suture removal (26 or more) _17  - 0 Hypo/Hyperglycemic Management (do not check if billed separately) _18  - 0 Ankle / Brachial Index (ABI) - do not check if billed separately Has the patient been seen at the hospital within the last three years: Yes Total  Score: 100 Level Of Care: New/Established - Level 3 Electronic Signature(s) Signed: 05/05/2020 6:54:10 PM By: Deon Pilling Entered By: Deon Pilling on 05/04/2020 10:35:14 -------------------------------------------------------------------------------- Lower Extremity Assessment Details Patient Name: Date of Service: GERADO, NABERS [place] LD O. 05/04/2020 9:30 A M Medical Record Number: [id number] Patient Account Number: [id number] Date of Birth/Sex: Treating RN: 1931-11-29 (87 y.o. Male) Deon Pilling Primary Care Kentrell Guettler: [name] Other Clinician: Referring Hardie Veltre: Treating Milli Woolridge/Extender: Judeth Porch in Treatment: 0 Notes no wounds to BLE. Electronic Signature(s) Signed: 05/05/2020 6:54:10 PM By: Deon Pilling Entered By: Deon Pilling on 05/04/2020 09:56:15 -------------------------------------------------------------------------------- Multi Wound Chart Details Patient Name: Date of Service: Tamala Julian, [name] LD O. 05/04/2020 9:30 A M Medical Record Number: [id number] Patient Account Number: [id number] Date of Birth/Sex: Treating RN: 1931/03/05 (87 y.o. Male) Rhae Hammock Primary Care Calvert Charland: [name] Other Clinician: Referring Kaeden Mester: Treating Lilyana Lippman/Extender: Judeth Porch in Treatment: 0 Vital Signs Height(in): 63 Pulse(bpm): 30 Weight(lbs): 213 Blood Pressure(mmHg): 132/76 Body Mass Index(BMI): 31 Temperature(F): 97.6 Respiratory Rate(breaths/min): 18 Photos: [1:No Photos Left, Proximal Forearm] [2:No Photos Left, Distal Forearm] [3:No Photos Right Wrist] Wound Location: [1:Trauma] [2:Trauma] [3:Trauma] Wounding  Event: [1:Skin T ear] [2:Skin T ear] [3:Skin T ear] Primary Etiology: [1:Anemia, Arrhythmia, Hypertension, Anemia, Arrhythmia, Hypertension, Anemia, Arrhythmia, Hypertension,] Comorbid History: [1:Peripheral Venous Disease, Type II Peripheral Venous Disease, Type II Peripheral Venous Disease, Type II Diabetes, Gout, Confinement Anxiety Diabetes, Gout, Confinement Anxiety Diabetes, Gout, Confinement Anxiety 04/02/2020]  [2:04/02/2020] [3:04/02/2020] Date Acquired: [1:0] [2:0] [3:0] Weeks of Treatment: [1:Open] [2:Open] [3:Open] Wound Status: [1:2.8x3.4x0.1] [2:3x2.3x0.1] [3:0.5x0.2x0.1] Measurements L x W x D (cm) [1:7.477] [2:5.419] [3:0.079] A (cm) : rea [1:0.748] [2:0.542] [3:0.008] Volume (cm) : [1:Full Thickness Without Exposed] [2:Full Thickness Without Exposed] [3:Full Thickness Without Exposed] Classification: [1:Support Structures Medium] [2:Support Structures Medium] [3:Support Structures Medium] Exudate Amount: [1:Serosanguineous] [2:Serosanguineous] [3:Serosanguineous] Exudate Type: [1:red, brown] [2:red, brown] [3:red, brown] Exudate Color: [1:Distinct, outline attached] [2:Distinct, outline attached] [3:Distinct, outline attached] Wound Margin: [1:Large (67-100%)] [2:Large (67-100%)] [3:Large (67-100%)] Granulation Amount: [1:Red] [2:Red] [3:Red] Granulation Quality: [1:None Present (0%)] [2:None Present (0%)] [3:None Present (0%)] Necrotic Amount: [1:Fat Layer (Subcutaneous Tissue): Yes Fat Layer (Subcutaneous Tissue): Yes Fat Layer (Subcutaneous Tissue): Yes] Exposed Structures: [1:Fascia: No Tendon: No Muscle: No Joint: No Bone: No Small (1-33%)] [2:Fascia: No Tendon: No Muscle: No Joint: No Bone: No Small (1-33%)] [3:Fascia: No Tendon: No Muscle: No Joint: No Bone: No Small (1-33%)] Epithelialization: [1:Chemical/Enzymatic/Mechanical] [2:Debridement - Selective/Open Wound Chemical/Enzymatic/Mechanical] Debridement: [1:N/A] [2:10:28] [3:N/A] Pre-procedure Verification/Time  Out Taken: [1:N/A] [2:Lidocaine 4% Topical Solution] [3:N/A] Pain Control: [1:N/A] [2:Necrotic/Eschar] [3:N/A] Tissue Debrided: [1:N/A] [2:Skin/Epidermis] [3:N/A] Level: [1:N/A] [2:8.75] [3:N/A] Debridement A (sq cm): [1:rea N/A] [2:Forceps, Scissors] [3:N/A] Instrument: [1:Minimum] [2:None] [3:Minimum] Bleeding: [1:N/A] [2:0] [3:N/A] Procedural Pain: [1:N/A] [2:0] [3:N/A] Post Procedural Pain: [1:Procedure was tolerated well] [2:Procedure was tolerated well] [3:Procedure was tolerated well] Debridement Treatment Response: [1:2.8x3.4x0.1] [2:3x2.3x0.1] [3:0.5x0.2x0.1] Post Debridement Measurements L x W x D (cm) [1:0.748] [2:0.542] [3:0.008] Post Debridement Volume: (cm) [1:Debridement] [2:Debridement] [3:Debridement] Treatment Notes Electronic Signature(s) Signed: 05/04/2020 11:05:31 AM By: Kalman Shan DO Signed: 05/07/2020 3:14:58 PM By: Rhae Hammock RN Entered By: Kalman Shan on 05/04/2020 10:55:10 -------------------------------------------------------------------------------- Multi-Disciplinary Care Plan Details Patient Name: Date of Service: [place], [place] NA LD O. 05/04/2020 9:30 A M Medical Record Number: [id number] Patient Account Number: [id number] Date of Birth/Sex: Treating RN: [birth year] (87 y.o. Male) Deon Pilling Primary Care Xzaviar Maloof: [name] Other Clinician: Referring Elsie Sakuma: Treating Dream Harman/Extender: Judeth Porch in Treatment: 0 Active  Inactive Abuse / Safety / Falls / Self Care Management Nursing Diagnoses: History of Falls Potential for falls Goals: Patient/caregiver will verbalize understanding of skin care regimen Date Initiated: 05/04/2020 Target Resolution Date: 06/04/2020 Goal Status: Active Interventions: Assess self care needs on admission and as needed Provide education on fall prevention Notes: Orientation to the Wound Care Program Nursing Diagnoses: Knowledge deficit related to the wound healing  center program Goals: Patient/caregiver will verbalize understanding of the [place] Program Date Initiated: 05/04/2020 Target Resolution Date: 06/04/2020 Goal Status: Active Interventions: Provide education on orientation to the wound center Notes: Pain, Acute or Chronic Nursing Diagnoses: Potential alteration in comfort, pain Goals: Patient will verbalize adequate pain control and receive pain control interventions during procedures as needed Date Initiated: 05/04/2020 Target Resolution Date: 06/04/2020 Goal Status: Active Patient/caregiver will verbalize comfort level met Date Initiated: 05/04/2020 Target Resolution Date: 06/25/2020 Goal Status: Active Interventions: Provide education on pain management Reposition patient for comfort Notes: Wound/Skin Impairment Nursing Diagnoses: Knowledge deficit related to ulceration/compromised skin integrity Goals: Patient/caregiver will verbalize understanding of skin care regimen Date Initiated: 05/04/2020 Target Resolution Date: 07/02/2020 Goal Status: Active Interventions: Assess patient/caregiver ability to obtain necessary supplies Assess patient/caregiver ability to perform ulcer/skin care regimen upon admission and as needed Provide education on ulcer and skin care Treatment Activities: Skin care regimen initiated : 05/04/2020 Topical wound management initiated : 05/04/2020 Notes: Electronic Signature(s) Signed: 05/05/2020 6:54:10 PM By: Deon Pilling Entered By: Deon Pilling on 05/04/2020 10:19:32 -------------------------------------------------------------------------------- Pain Assessment Details Patient Name: Date of Service: [place], [place] NA LD O. 05/04/2020 9:30 A M Medical Record Number: [id number] Patient Account Number: [id number] Date of Birth/Sex: Treating RN: 07/01/1931 (87 y.o. Male) Deon Pilling Primary Care Drago Hammonds: [name] Other Clinician: Referring Aldina Porta: Treating Isaak Delmundo/Extender: Judeth Porch in Treatment: 0 Active Problems Location of Pain Severity and Description of Pain Patient Has Paino No Site Locations Rate the pain. Rate the pain. Current Pain Level: 0 Pain Management and Medication Current Pain Management: Medication: No Cold Application: No Rest: No Massage: No Activity: No T.E.N.S.: No Heat Application: No Leg drop or elevation: No Is the Current Pain Management Adequate: Adequate How does your wound impact your activities of daily livingo Sleep: No Bathing: No Appetite: No Relationship With Others: No Bladder Continence: No Emotions: No Bowel Continence: No Work: No Toileting: No Drive: No Dressing: No Hobbies: No Electronic Signature(s) Signed: 05/05/2020 6:54:10 PM By: Deon Pilling Entered By: Deon Pilling on 05/04/2020 09:56:24 -------------------------------------------------------------------------------- Patient/Caregiver Education Details Patient Name: Date of Service: [place] NA LD Jenetta Downer 5/3/2022andnbsp9:30 A M Medical Record Number: [id number] Patient Account Number: [id number] Date of Birth/Gender: Treating RN: 12-05-1931 (87 y.o. Male) Deon Pilling Primary Care Physician: [name] Other Clinician: Referring Physician: Treating Physician/Extender: Judeth Porch in Treatment: 0 Education Assessment Education Provided To: Patient and Caregiver Education Topics Provided Reese: o Handouts: Welcome T [facility] [place] o Methods: Explain/Verbal Responses: Reinforcements needed Electronic Signature(s) Signed: 05/05/2020 6:54:10 PM By: Deon Pilling Entered By: Deon Pilling on 05/04/2020 10:19:58 -------------------------------------------------------------------------------- Wound Assessment Details Patient Name: Date of Service: [place], [place] NA LD O. 05/04/2020 9:30 A M Medical Record Number: [id number] Patient Account Number:  [id number] Date of Birth/Sex: Treating RN: 1931-09-28 (87 y.o. Male) Deon Pilling Primary Care Folasade Mooty: [name] Other Clinician: Referring Macari Zalesky: Treating Wrigley Plasencia/Extender: Genoveva Ill Weeks in Treatment: 0 Wound Status Wound Number: 1 Primary Skin Tear Etiology: Wound Location: Left, Proximal Forearm  Wound Open Wounding Event: Trauma Status: Date Acquired: 04/02/2020 Comorbid Anemia, Arrhythmia, Hypertension, Peripheral Venous Disease, Weeks Of Treatment: 0 History: Type II Diabetes, Gout, Confinement Anxiety Clustered Wound: No Photos Wound Measurements Length: (cm) 2.8 Width: (cm) 3.4 Depth: (cm) 0.1 Area: (cm) 7.477 Volume: (cm) 0.748 % Reduction in Area: 0% % Reduction in Volume: 0% Epithelialization: Small (1-33%) Tunneling: No Undermining: No Wound Description Classification: Full Thickness Without Exposed Support Structures Wound Margin: Distinct, outline attached Exudate Amount: Medium Exudate Type: Serosanguineous Exudate Color: red, brown Foul Odor After Cleansing: No Slough/Fibrino No Wound Bed Granulation Amount: Large (67-100%) Exposed Structure Granulation Quality: Red Fascia Exposed: No Necrotic Amount: None Present (0%) Fat Layer (Subcutaneous Tissue) Exposed: Yes Tendon Exposed: No Muscle Exposed: No Joint Exposed: No Bone Exposed: No Electronic Signature(s) Signed: 05/04/2020 4:47:39 PM By: Sandre Kitty Signed: 05/05/2020 6:54:10 PM By: Deon Pilling Entered By: Sandre Kitty on 05/04/2020 16:42:47 -------------------------------------------------------------------------------- Wound Assessment Details Patient Name: Date of Service: Tamala Julian, [name] LD O. 05/04/2020 9:30 A M Medical Record Number: [id number] Patient Account Number: [id number] Date of Birth/Sex: Treating RN: [birth year] (87 y.o. Male) Deon Pilling Primary Care Cledith Abdou: [name] Other Clinician: Referring Stanislawa Gaffin: Treating  Jaretssi Kraker/Extender: Judeth Porch in Treatment: 0 Wound Status Wound Number: 2 Primary Skin Tear Etiology: Wound Location: Left, Distal Forearm Wound Open Wounding Event: Trauma Status: Date Acquired: 04/02/2020 Comorbid Anemia, Arrhythmia, Hypertension, Peripheral Venous Disease, Weeks Of Treatment: 0 History: Type II Diabetes, Gout, Confinement Anxiety Clustered Wound: No Photos Wound Measurements Length: (cm) 3 Width: (cm) 2.3 Depth: (cm) 0.1 Area: (cm) 5.419 Volume: (cm) 0.542 % Reduction in Area: 0% % Reduction in Volume: 0% Epithelialization: Small (1-33%) Tunneling: No Undermining: No Wound Description Classification: Full Thickness Without Exposed Support Structures Wound Margin: Distinct, outline attached Exudate Amount: Medium Exudate Type: Serosanguineous Exudate Color: red, brown Foul Odor After Cleansing: No Slough/Fibrino No Wound Bed Granulation Amount: Large (67-100%) Exposed Structure Granulation Quality: Red Fascia Exposed: No Necrotic Amount: None Present (0%) Fat Layer (Subcutaneous Tissue) Exposed: Yes Tendon Exposed: No Muscle Exposed: No Joint Exposed: No Bone Exposed: No Electronic Signature(s) Signed: 05/04/2020 4:47:39 PM By: Sandre Kitty Signed: 05/05/2020 6:54:10 PM By: Deon Pilling Entered By: Sandre Kitty on 05/04/2020 16:42:32 -------------------------------------------------------------------------------- Wound Assessment Details Patient Name: Date of Service: Tamala Julian, [name] LD O. 05/04/2020 9:30 A M Medical Record Number: [id number] Patient Account Number: [id number] Date of Birth/Sex: Treating RN: [birth year] (87 y.o. Male) Deon Pilling Primary Care Shamonique Battiste: [name] Other Clinician: Referring Makaiah Terwilliger: Treating Chanti Golubski/Extender: Genoveva Ill Weeks in Treatment: 0 Wound Status Wound Number: 3 Primary Skin Tear Etiology: Wound Location: Right Wrist Wound  Open Wounding Event: Trauma Status: Date Acquired: 04/02/2020 Comorbid Anemia, Arrhythmia, Hypertension, Peripheral Venous Disease, Weeks Of Treatment: 0 History: Type II Diabetes, Gout, Confinement Anxiety Clustered Wound: No Photos Wound Measurements Length: (cm) 0.5 Width: (cm) 0.2 Depth: (cm) 0.1 Area: (cm) 0.079 Volume: (cm) 0.008 % Reduction in Area: 0% % Reduction in Volume: 0% Epithelialization: Small (1-33%) Tunneling: No Undermining: No Wound Description Classification: Full Thickness Without Exposed Support Structures Wound Margin: Distinct, outline attached Exudate Amount: Medium Exudate Type: Serosanguineous Exudate Color: red, brown Foul Odor After Cleansing: No Slough/Fibrino No Wound Bed Granulation Amount: Large (67-100%) Exposed Structure Granulation Quality: Red Fascia Exposed: No Necrotic Amount: None Present (0%) Fat Layer (Subcutaneous Tissue) Exposed: Yes Tendon Exposed: No Muscle Exposed: No Joint Exposed: No Bone Exposed: No Electronic Signature(s) Signed: 05/04/2020 4:47:39 PM By: Sandre Kitty Signed: 05/05/2020 6:54:10 PM By:  Deaton, Bobbi Entered By: Sandre Kitty on 05/04/2020 16:43:03 -------------------------------------------------------------------------------- Vitals Details Patient Name: Date of Service: [place], [place] NA LD O. 05/04/2020 9:30 A M Medical Record Number: [id number] Patient Account Number: [id number] Date of Birth/Sex: Treating RN: Aug 13, 1931 (87 y.o. Male) Rolin Barry, [place] Primary Care Garnie Borchardt: [name] Other Clinician: Referring Marijayne Rauth: Treating Vanette Noguchi/Extender: Judeth Porch in Treatment: 0 Vital Signs Time Taken: 09:48 Temperature (F): 97.6 Height (in): 69 Pulse (bpm): 67 Source: Stated Respiratory Rate (breaths/min): 18 Weight (lbs): 213 Blood Pressure (mmHg): 132/76 Source: Stated Reference Range: 80 - 120 mg / dl Body Mass Index (BMI): 31.5 Electronic  Signature(s) Signed: 05/05/2020 6:54:10 PM By: Deon Pilling Entered By: Deon Pilling on 05/04/2020 09:53:59

## 2020-05-10 DIAGNOSIS — S51801A Unspecified open wound of right forearm, initial encounter: Secondary | ICD-10-CM | POA: Diagnosis not present

## 2020-05-10 DIAGNOSIS — S61501A Unspecified open wound of right wrist, initial encounter: Secondary | ICD-10-CM | POA: Diagnosis not present

## 2020-05-10 DIAGNOSIS — E119 Type 2 diabetes mellitus without complications: Secondary | ICD-10-CM | POA: Diagnosis not present

## 2020-05-10 DIAGNOSIS — S51802A Unspecified open wound of left forearm, initial encounter: Secondary | ICD-10-CM | POA: Diagnosis not present

## 2020-05-11 ENCOUNTER — Other Ambulatory Visit: Payer: Self-pay

## 2020-05-11 ENCOUNTER — Encounter (HOSPITAL_BASED_OUTPATIENT_CLINIC_OR_DEPARTMENT_OTHER): Payer: Medicare HMO | Admitting: Internal Medicine

## 2020-05-11 DIAGNOSIS — Z7901 Long term (current) use of anticoagulants: Secondary | ICD-10-CM | POA: Diagnosis not present

## 2020-05-11 DIAGNOSIS — I872 Venous insufficiency (chronic) (peripheral): Secondary | ICD-10-CM | POA: Diagnosis not present

## 2020-05-11 DIAGNOSIS — I129 Hypertensive chronic kidney disease with stage 1 through stage 4 chronic kidney disease, or unspecified chronic kidney disease: Secondary | ICD-10-CM | POA: Diagnosis not present

## 2020-05-11 DIAGNOSIS — Z87891 Personal history of nicotine dependence: Secondary | ICD-10-CM | POA: Diagnosis not present

## 2020-05-11 DIAGNOSIS — I4891 Unspecified atrial fibrillation: Secondary | ICD-10-CM | POA: Diagnosis not present

## 2020-05-11 DIAGNOSIS — E1122 Type 2 diabetes mellitus with diabetic chronic kidney disease: Secondary | ICD-10-CM | POA: Diagnosis not present

## 2020-05-11 DIAGNOSIS — S51802D Unspecified open wound of left forearm, subsequent encounter: Secondary | ICD-10-CM

## 2020-05-11 DIAGNOSIS — N182 Chronic kidney disease, stage 2 (mild): Secondary | ICD-10-CM | POA: Diagnosis not present

## 2020-05-11 NOTE — Progress Notes (Signed)
REYCE, LUBECK ([id number]) Visit Report for 05/11/2020 Chief Complaint Document Details Patient Name: Date of Service: [place], [place] LD O. 05/11/2020 8:30 A M Medical Record Number: [id number] Patient Account Number: [id number] Date of Birth/Sex: Treating RN: [birth year] (87 y.o. [name] Primary Care Provider: [name] Other Clinician: Referring Provider: Treating Provider/Extender: Judeth Porch in Treatment: 1 Information Obtained from: Patient Chief Complaint Wounds to the left forearm and right wrist Electronic Signature(s) Signed: 05/11/2020 10:31:46 AM By: Kalman Shan DO Entered By: Kalman Shan on 05/11/2020 10:18:40 -------------------------------------------------------------------------------- HPI Details Patient Name: Date of Service: [place], [name] LD O. 05/11/2020 8:30 A M Medical Record Number: [id number] Patient Account Number: [id number] Date of Birth/Sex: Treating RN: Dec 28, 1931 (27 y.o. [name] Primary Care Provider: [name] Other Clinician: Referring Provider: Treating Provider/Extender: Judeth Porch in Treatment: 1 History of Present Illness HPI Description: Admission 5/3 Mr. Cull is an 87 year old male with a past medical history of essential hypertension, type 2 diabetes on oral agents, atrial fibrillation on warfarin, and chronic venous insufficiency That presents today for 3 wounds. 2 are located to his left forearm and one is on the right wrist. He developed the most distal wound to his left forearm 1 month ago when he was reaching behind the headboard and developed a skin tear. This has been healing well for the past month. The other 2 wounds were developed after removing a Band-Aid from the area. These are also limited to skin tears. These developed about 1 week ago. He has been using a T pad and Band-Aid to the wounds. He overall feels well denies any signs of  infection. elfa 5/10; patient presents for 1 week follow-up. He has been using collagen to the 3 wounds. 2 of the wounds have closed. He has no complaints or issues today. He denies acute signs of infection. Electronic Signature(s) Signed: 05/11/2020 10:31:46 AM By: Kalman Shan DO Entered By: Kalman Shan on 05/11/2020 10:19:19 -------------------------------------------------------------------------------- Physical Exam Details Patient Name: Date of Service: [place], [name] LD O. 05/11/2020 8:30 A M Medical Record Number: [id number] Patient Account Number: [id number] Date of Birth/Sex: Treating RN: 22-Dec-1931 (89 y.o. [name] Primary Care Provider: [name] Other Clinician: Referring Provider: Treating Provider/Extender: Judeth Porch in Treatment: 1 Constitutional respirations regular, non-labored and within target range for patient.Marland Kitchen Psychiatric pleasant and cooperative. Notes Left arm: Patient has 1 wound remaining. There is excellent granulation tissue present. The most distal wound has epithelialized. His right wrist had a wound that has scabbed over. No signs of infection Electronic Signature(s) Signed: 05/11/2020 10:31:46 AM By: Kalman Shan DO Entered By: Kalman Shan on 05/11/2020 10:20:43 -------------------------------------------------------------------------------- Physician Orders Details Patient Name: Date of Service: Tamala Julian, [name] LD O. 05/11/2020 8:30 A M Medical Record Number: [id number] Patient Account Number: [id number] Date of Birth/Sex: Treating RN: [birth year] (87 y.o. [name], Meta.Reding Primary Care Provider: [name] Other Clinician: Referring Provider: Treating Provider/Extender: Judeth Porch in Treatment: 1 Verbal / Phone Orders: No Diagnosis Coding ICD-10 Coding Code Description [phone] Unspecified open wound of left forearm, subsequent encounter S51.801D  Unspecified open wound of right forearm, subsequent encounter I10 Essential (primary) hypertension I87.2 Venous insufficiency (chronic) (peripheral) E11.9 Type 2 diabetes mellitus without complications Z76.73 Unspecified atrial fibrillation Follow-up Appointments ppointment in 2 weeks. - Dr. Heber [place] Return A Bathing/ Shower/ Hygiene May shower and wash wound with soap and water. Additional Orders / Instructions Other: - Please apply stretch  net to right 4 fourth finger for protection of nail. Wound Treatment Wound #1 - Forearm Wound Laterality: Left, Proximal Cleanser: Soap and Water 1 x Per [id number] Days Discharge Instructions: May shower and wash wound with dial antibacterial soap and water prior to dressing change. Prim Dressing: FIBRACOL Plus Dressing, 2x2 in (collagen) (Generic) 1 x Per Day/15 Days ary Discharge Instructions: Moisten collagen with saline or hydrogel Secondary Dressing: T Non-adherent Dressing, 2x3 in (Generic) 1 x Per Day/15 Days elfa Discharge Instructions: Apply over primary dressing as directed. Secured With: Child psychotherapist, Sterile 2x75 (in/in) (Generic) 1 x Per Day/15 Days Discharge Instructions: Secure with stretch gauze as directed. Secured With: 59M Medipore H Soft Cloth Surgical T 4 x 2 (in/yd) (Generic) 1 x Per Day/15 Days ape Discharge Instructions: Secure dressing with tape as directed. Electronic Signature(s) Signed: 05/11/2020 10:31:46 AM By: Kalman Shan DO Entered By: Kalman Shan on 05/11/2020 10:21:13 -------------------------------------------------------------------------------- Problem List Details Patient Name: Date of Service: [place], [name] LD O. 05/11/2020 8:30 A M Medical Record Number: [id number] Patient Account Number: [id number] Date of Birth/Sex: Treating RN: 1931/05/12 (77 y.o. [name] Primary Care Provider: [name] Other Clinician: Referring Provider: Treating Provider/Extender:  Judeth Porch in Treatment: 1 Active Problems ICD-10 Encounter Code Description Active Date MDM Diagnosis S51.802D Unspecified open wound of left forearm, subsequent encounter 05/11/2020 No Yes S51.801D Unspecified open wound of right forearm, subsequent encounter 05/11/2020 No Yes I10 Essential (primary) hypertension 05/04/2020 No Yes I87.2 Venous insufficiency (chronic) (peripheral) 05/04/2020 No Yes E11.9 Type 2 diabetes mellitus without complications 0000000 No Yes I48.91 Unspecified atrial fibrillation 05/04/2020 No Yes Inactive Problems ICD-10 Code Description Active Date Inactive Date S51.802A Unspecified open wound of left forearm, initial encounter 05/04/2020 05/04/2020 S51.801A Unspecified open wound of right forearm, initial encounter 05/04/2020 05/04/2020 Resolved Problems Electronic Signature(s) Signed: 05/11/2020 10:31:46 AM By: Kalman Shan DO Entered By: Kalman Shan on 05/11/2020 10:17:21 -------------------------------------------------------------------------------- Progress Note Details Patient Name: Date of Service: Tamala Julian, [name] LD O. 05/11/2020 8:30 A M Medical Record Number: [id number] Patient Account Number: [id number] Date of Birth/Sex: Treating RN: 09/30/1931 (87 y.o. [name] Primary Care Provider: [name] Other Clinician: Referring Provider: Treating Provider/Extender: Judeth Porch in Treatment: 1 Subjective Chief Complaint Information obtained from Patient Wounds to the left forearm and right wrist History of Present Illness (HPI) Admission 5/3 Mr. Rathman is an 87 year old male with a past medical history of essential hypertension, type 2 diabetes on oral agents, atrial fibrillation on warfarin, and chronic venous insufficiency That presents today for 3 wounds. 2 are located to his left forearm and one is on the right wrist. He developed the most distal wound to his left forearm 1  month ago when he was reaching behind the headboard and developed a skin tear. This has been healing well for the past month. The other 2 wounds were developed after removing a Band-Aid from the area. These are also limited to skin tears. These developed about 1 week ago. He has been using a T pad and Band-Aid to the wounds. He overall feels well denies any signs of infection. elfa 5/10; patient presents for 1 week follow-up. He has been using collagen to the 3 wounds. 2 of the wounds have closed. He has no complaints or issues today. He denies acute signs of infection. Patient History Information obtained from Patient. Family History Diabetes - Father, Heart Disease - Father, Hypertension - Father, Stroke - Father, No family history  of Cancer, Hereditary Spherocytosis, Kidney Disease, Lung Disease, Seizures, Thyroid Problems, Tuberculosis. Social History Former smoker - quit at 87 years old, Marital Status - Married, Alcohol Use - Never, Drug Use - No History, Caffeine Use - Rarely. Medical History Eyes Denies history of Cataracts, Glaucoma, Optic Neuritis Ear/Nose/Mouth/Throat Denies history of Chronic sinus problems/congestion, Middle ear problems Hematologic/Lymphatic Patient has history of Anemia Denies history of Hemophilia, Human Immunodeficiency Virus, Lymphedema, Sickle Cell Disease Respiratory Denies history of Aspiration, Asthma, Chronic Obstructive Pulmonary Disease (COPD), Pneumothorax, Sleep Apnea, Tuberculosis Cardiovascular Patient has history of Arrhythmia - A.fib, Hypertension, Peripheral Venous Disease Gastrointestinal Denies history of Cirrhosis , Colitis, Crohnoos, Hepatitis A, Hepatitis B, Hepatitis C Endocrine Patient has history of Type II Diabetes Immunological Denies history of Lupus Erythematosus, Raynaudoos, Scleroderma Integumentary (Skin) Denies history of History of Burn Musculoskeletal Patient has history of Gout Denies history of Rheumatoid  Arthritis, Osteoarthritis, Osteomyelitis Neurologic Denies history of Dementia, Neuropathy, Quadriplegia, Paraplegia, Seizure Disorder Oncologic Denies history of Received Chemotherapy, Received Radiation Psychiatric Patient has history of Confinement Anxiety Denies history of Anorexia/bulimia Hospitalization/Surgery History - 2017 GI bleed. Medical A Surgical History Notes nd Cardiovascular hyperlipidemia Gastrointestinal chronic constipation Genitourinary Stage II kidney Disease Musculoskeletal osteopenia Objective Constitutional respirations regular, non-labored and within target range for patient.. Vitals Time Taken: 8:42 AM, Height: 69 in, Weight: 213 lbs, BMI: 31.5, Temperature: 98.3 F, Pulse: 71 bpm, Respiratory Rate: 18 breaths/min, Blood Pressure: 153/65 mmHg. Psychiatric pleasant and cooperative. General Notes: Left arm: Patient has 1 wound remaining. There is excellent granulation tissue present. The most distal wound has epithelialized. His right wrist had a wound that has scabbed over. No signs of infection Integumentary (Hair, Skin) Wound #1 status is Open. Original cause of wound was Trauma. The date acquired was: 04/02/2020. The wound has been in treatment 1 weeks. The wound is located on the Left,Proximal Forearm. The wound measures 0.8cm length x 0.8cm width x 0.1cm depth; 0.503cm^2 area and 0.05cm^3 volume. There is Fat Layer (Subcutaneous Tissue) exposed. There is no tunneling or undermining noted. There is a medium amount of serosanguineous drainage noted. The wound margin is flat and intact. There is large (67-100%) red, friable granulation within the wound bed. There is no necrotic tissue within the wound bed. Wound #2 status is Healed - Epithelialized. Original cause of wound was Trauma. The date acquired was: 04/02/2020. The wound has been in treatment 1 weeks. The wound is located on the Left,Distal Forearm. The wound measures 0cm length x 0cm width x 0cm  depth; 0cm^2 area and 0cm^3 volume. There is no tunneling or undermining noted. There is a none present amount of drainage noted. There is no granulation within the wound bed. There is no necrotic tissue within the wound bed. Wound #3 status is Open. Original cause of wound was Trauma. The date acquired was: 04/02/2020. The wound has been in treatment 1 weeks. The wound is located on the Right Wrist. The wound measures 0cm length x 0cm width x 0cm depth; 0cm^2 area and 0cm^3 volume. There is Fat Layer (Subcutaneous Tissue) exposed. There is no tunneling or undermining noted. There is a none present amount of drainage noted. The wound margin is flat and intact. There is no granulation within the wound bed. There is no necrotic tissue within the wound bed. General Notes: scab covered Assessment Active Problems ICD-10 Unspecified open wound of left forearm, subsequent encounter Unspecified open wound of right forearm, subsequent encounter Essential (primary) hypertension Venous insufficiency (chronic) (peripheral) Type 2 diabetes mellitus  without complications Unspecified atrial fibrillation Patient presents with 2 out of the 3 wounds healed. The last one is on his left forearm and this is improved In appearance and size since last clinic visit. There is excellent granulation tissue present. we will continue with collagen and see him in 2 weeks. Hopefully this will be closed by then. Plan Follow-up Appointments: Return Appointment in 2 weeks. - Dr. Heber Economy Bathing/ Shower/ Hygiene: May shower and wash wound with soap and water. Additional Orders / Instructions: Other: - Please apply stretch net to right 4 fourth finger for protection of nail. WOUND #1: - Forearm Wound Laterality: Left, Proximal Cleanser: Soap and Water 1 x Per Day/15 Days Discharge Instructions: May shower and wash wound with dial antibacterial soap and water prior to dressing change. Prim Dressing: FIBRACOL Plus Dressing,  2x2 in (collagen) (Generic) 1 x Per Day/15 Days ary Discharge Instructions: Moisten collagen with saline or hydrogel Secondary Dressing: T Non-adherent Dressing, 2x3 in (Generic) 1 x Per Day/15 Days elfa Discharge Instructions: Apply over primary dressing as directed. Secured With: Child psychotherapist, Sterile 2x75 (in/in) (Generic) 1 x Per Day/15 Days Discharge Instructions: Secure with stretch gauze as directed. Secured With: 33M Medipore H Soft Cloth Surgical T 4 x 2 (in/yd) (Generic) 1 x Per Day/15 Days ape Discharge Instructions: Secure dressing with tape as directed. 1. Collagen 2. Follow-up in 2 weeks Electronic Signature(s) Signed: 05/11/2020 10:31:46 AM By: Kalman Shan DO Entered By: Kalman Shan on 05/11/2020 10:30:57 -------------------------------------------------------------------------------- HxROS Details Patient Name: Date of Service: [place], [name] LD O. 05/11/2020 8:30 A M Medical Record Number: [id number] Patient Account Number: [id number] Date of Birth/Sex: Treating RN: [birth year] (63 y.o. [name] Primary Care Provider: [name] Other Clinician: Referring Provider: Treating Provider/Extender: Judeth Porch in Treatment: 1 Information Obtained From Patient Eyes Medical History: Negative for: Cataracts; Glaucoma; Optic Neuritis Ear/Nose/Mouth/Throat Medical History: Negative for: Chronic sinus problems/congestion; Middle ear problems Hematologic/Lymphatic Medical History: Positive for: Anemia Negative for: Hemophilia; Human Immunodeficiency Virus; Lymphedema; Sickle Cell Disease Respiratory Medical History: Negative for: Aspiration; Asthma; Chronic Obstructive Pulmonary Disease (COPD); Pneumothorax; Sleep Apnea; Tuberculosis Cardiovascular Medical History: Positive for: Arrhythmia - A.fib; Hypertension; Peripheral Venous Disease Past Medical History  Notes: hyperlipidemia Gastrointestinal Medical History: Negative for: Cirrhosis ; Colitis; Crohns; Hepatitis A; Hepatitis B; Hepatitis C Past Medical History Notes: chronic constipation Endocrine Medical History: Positive for: Type II Diabetes Time with diabetes: 15 years ago Treated with: Diet Blood sugar tested every day: No Genitourinary Medical History: Past Medical History Notes: Stage II kidney Disease Immunological Medical History: Negative for: Lupus Erythematosus; Raynauds; Scleroderma Integumentary (Skin) Medical History: Negative for: History of Burn Musculoskeletal Medical History: Positive for: Gout Negative for: Rheumatoid Arthritis; Osteoarthritis; Osteomyelitis Past Medical History Notes: osteopenia Neurologic Medical History: Negative for: Dementia; Neuropathy; Quadriplegia; Paraplegia; Seizure Disorder Oncologic Medical History: Negative for: Received Chemotherapy; Received Radiation Psychiatric Medical History: Positive for: Confinement Anxiety Negative for: Anorexia/bulimia Immunizations Pneumococcal Vaccine: Received Pneumococcal Vaccination: No Implantable Devices No devices added Hospitalization / Surgery History Type of Hospitalization/Surgery 2017 GI bleed Family and Social History Cancer: No; Diabetes: Yes - Father; Heart Disease: Yes - Father; Hereditary Spherocytosis: No; Hypertension: Yes - Father; Kidney Disease: No; Lung Disease: No; Seizures: No; Stroke: Yes - Father; Thyroid Problems: No; Tuberculosis: No; Former smoker - quit at 87 years old; Marital Status - Married; Alcohol Use: Never; Drug Use: No History; Caffeine Use: Rarely; Financial Concerns: No; Food, Clothing or Shelter Needs: No; Support System Lacking:  No; Transportation Concerns: No Electronic Signature(s) Signed: 05/11/2020 10:31:46 AM By: Kalman Shan DO Signed: 05/11/2020 6:10:24 PM By: Deon Pilling Entered By: Kalman Shan on 05/11/2020  10:19:28 -------------------------------------------------------------------------------- SuperBill Details Patient Name: Date of Service: [place], [name] LD O. 05/11/2020 Medical Record Number: [id number] Patient Account Number: [id number] Date of Birth/Sex: Treating RN: 1931/08/01 (62 y.o. [name] Primary Care Provider: [name] Other Clinician: Referring Provider: Treating Provider/Extender: Judeth Porch in Treatment: 1 Diagnosis Coding ICD-10 Codes Code Description [phone] Unspecified open wound of left forearm, subsequent encounter S51.801D Unspecified open wound of right forearm, subsequent encounter I10 Essential (primary) hypertension I87.2 Venous insufficiency (chronic) (peripheral) E11.9 Type 2 diabetes mellitus without complications X32.35 Unspecified atrial fibrillation Facility Procedures CPT4 Code: [id number] Description: 99214 - WOUND CARE VISIT-LEV 4 EST PT Modifier: Quantity: 1 Physician Procedures : CPT4 Code Description Modifier [id number] - WC PHYS LEVEL 3 - EST PT ICD-10 Diagnosis Description S51.802D Unspecified open wound of left forearm, subsequent encounter S51.801D Unspecified open wound of right forearm, subsequent encounter Quantity: 1 Electronic Signature(s) Signed: 05/11/2020 10:31:46 AM By: Kalman Shan DO Entered By: Kalman Shan on 05/11/2020 10:31:22

## 2020-05-11 NOTE — Progress Notes (Signed)
KAILEB, PERDOMO ([id number]) Visit Report for 05/04/2020 Chief Complaint Document Details Patient Name: Date of Service: [place], [place] LD O. 05/04/2020 9:30 A M Medical Record Number: [id number] Patient Account Number: [id number] Date of Birth/Sex: Treating RN: [birth year] (87 y.o. Burnadette Pop, [name] Primary Care Provider: [name] Other Clinician: Referring Provider: Treating Provider/Extender: Judeth Porch in Treatment: 0 Information Obtained from: Patient Chief Complaint Wounds to the left forearm and right wrist Electronic Signature(s) Signed: 05/04/2020 11:05:31 AM By: Kalman Shan DO Entered By: Kalman Shan on 05/04/2020 10:55:38 -------------------------------------------------------------------------------- Debridement Details Patient Name: Date of Service: Tamala Julian, [name] LD O. 05/04/2020 9:30 A M Medical Record Number: [id number] Patient Account Number: [id number] Date of Birth/Sex: Treating RN: [birth year] (87 y.o. [name] Primary Care Provider: [name] Other Clinician: Referring Provider: Treating Provider/Extender: Judeth Porch in Treatment: 0 Debridement Performed for Assessment: Wound #1 Left,Proximal Forearm Performed By: Clinician Deon Pilling, RN Debridement Type: Chemical/Enzymatic/Mechanical Agent Used: gauze and wound cleanser Level of Consciousness (Pre-procedure): Awake and Alert Pre-procedure Verification/Time Out No Taken: Bleeding: Minimum Response to Treatment: Procedure was tolerated well Level of Consciousness (Post- Awake and Alert procedure): Post Debridement Measurements of Total Wound Length: (cm) 2.8 Width: (cm) 3.4 Depth: (cm) 0.1 Volume: (cm) 0.748 Character of Wound/Ulcer Post Debridement: Improved Post Procedure Diagnosis Same as Pre-procedure Electronic Signature(s) Signed: 05/04/2020 11:05:31 AM By: Kalman Shan DO Signed: 05/05/2020 6:54:10 PM By:  Deon Pilling Entered By: Deon Pilling on 05/04/2020 10:30:15 -------------------------------------------------------------------------------- Debridement Details Patient Name: Date of Service: Tamala Julian, [name] LD O. 05/04/2020 9:30 A M Medical Record Number: [id number] Patient Account Number: [id number] Date of Birth/Sex: Treating RN: [birth year] (87 y.o. [name] Primary Care Provider: [name] Other Clinician: Referring Provider: Treating Provider/Extender: Judeth Porch in Treatment: 0 Debridement Performed for Assessment: Wound #3 Right Wrist Performed By: Clinician Deon Pilling, RN Debridement Type: Chemical/Enzymatic/Mechanical Agent Used: gauze and wound cleanser Level of Consciousness (Pre-procedure): Awake and Alert Pre-procedure Verification/Time Out No Taken: Bleeding: Minimum Response to Treatment: Procedure was tolerated well Level of Consciousness (Post- Awake and Alert procedure): Post Debridement Measurements of Total Wound Length: (cm) 0.5 Width: (cm) 0.2 Depth: (cm) 0.1 Volume: (cm) 0.008 Character of Wound/Ulcer Post Debridement: Improved Post Procedure Diagnosis Same as Pre-procedure Electronic Signature(s) Signed: 05/04/2020 11:05:31 AM By: Kalman Shan DO Signed: 05/05/2020 6:54:10 PM By: Deon Pilling Entered By: Deon Pilling on 05/04/2020 10:30:53 -------------------------------------------------------------------------------- Debridement Details Patient Name: Date of Service: Tamala Julian, [name] LD O. 05/04/2020 9:30 A M Medical Record Number: [id number] Patient Account Number: [id number] Date of Birth/Sex: Treating RN: 13-May-1931 (87 y.o. [name], Meta.Reding Primary Care Provider: [name] Other Clinician: Referring Provider: Treating Provider/Extender: Judeth Porch in Treatment: 0 Debridement Performed for Assessment: Wound #2 Left,Distal Forearm Performed By: Physician  Kalman Shan, DO Debridement Type: Debridement Level of Consciousness (Pre-procedure): Awake and Alert Pre-procedure Verification/Time Out Yes - 10:28 Taken: Start Time: 10:29 Pain Control: Lidocaine 4% T opical Solution T Area Debrided (L x W): otal 3.5 (cm) x 2.5 (cm) = 8.75 (cm) Tissue and other material debrided: Non-Viable, Eschar, Skin: Epidermis Level: Skin/Epidermis Debridement Description: Selective/Open Wound Instrument: Forceps, Scissors Bleeding: None End Time: 10:33 Procedural Pain: 0 Post Procedural Pain: 0 Response to Treatment: Procedure was tolerated well Level of Consciousness (Post- Awake and Alert procedure): Post Debridement Measurements of Total Wound Length: (cm) 3 Width: (cm) 2.3 Depth: (cm) 0.1 Volume: (cm) 0.542 Character of Wound/Ulcer Post Debridement: Improved Post  Procedure Diagnosis Same as Pre-procedure Electronic Signature(s) Signed: 05/04/2020 11:05:31 AM By: Kalman Shan DO Signed: 05/05/2020 6:54:10 PM By: Deon Pilling Entered By: Deon Pilling on 05/04/2020 10:33:23 -------------------------------------------------------------------------------- HPI Details Patient Name: Date of Service: Tamala Julian, [name] LD O. 05/04/2020 9:30 A M Medical Record Number: [id number] Patient Account Number: [id number] Date of Birth/Sex: Treating RN: 1931-01-25 (87 y.o. Burnadette Pop, [name] Primary Care Provider: [name] Other Clinician: Referring Provider: Treating Provider/Extender: Judeth Porch in Treatment: 0 History of Present Illness HPI Description: Admission 5/3 Mr. Ostling is an 87 year old male with a past medical history of essential hypertension, type 2 diabetes on oral agents, atrial fibrillation on warfarin, and chronic venous insufficiency That presents today for 3 wounds. 2 are located to his left forearm and one is on the right wrist. He developed the most distal wound to his left forearm 1 month ago  when he was reaching behind the headboard and developed a skin tear. This has been healing well for the past month. The other 2 wounds were developed after removing a Band-Aid from the area. These are also limited to skin tears. These developed about 1 week ago. He has been using a T pad and Band-Aid to the wounds. He overall feels well denies any signs of infection. elfa Electronic Signature(s) Signed: 05/04/2020 11:05:31 AM By: Kalman Shan DO Entered By: Kalman Shan on 05/04/2020 10:58:21 -------------------------------------------------------------------------------- Physical Exam Details Patient Name: Date of Service: Tamala Julian, [name] LD O. 05/04/2020 9:30 A M Medical Record Number: [id number] Patient Account Number: [id number] Date of Birth/Sex: Treating RN: 27-Nov-1931 (87 y.o. Erie Noe Primary Care Provider: [name] Other Clinician: Referring Provider: Treating Provider/Extender: Judeth Porch in Treatment: 0 Constitutional respirations regular, non-labored and within target range for patient.Marland Kitchen Psychiatric pleasant and cooperative. Notes Left arm: Patient has 2 open wounds with excellent granulation tissue present. There is nonviable tissue to the distal wound and this was debrided. No signs of infection. Right wrist: Small open wound with excellent granulation tissue present. No signs of infection. Electronic Signature(s) Signed: 05/04/2020 11:05:31 AM By: Kalman Shan DO Entered By: Kalman Shan on 05/04/2020 10:58:33 -------------------------------------------------------------------------------- Physician Orders Details Patient Name: Date of Service: [place], [name] LD O. 05/04/2020 9:30 A M Medical Record Number: [id number] Patient Account Number: [id number] Date of Birth/Sex: Treating RN: 12-31-1931 (87 y.o. [name], Meta.Reding Primary Care Provider: [name] Other Clinician: Referring Provider: Treating  Provider/Extender: Judeth Porch in Treatment: 0 Verbal / Phone Orders: No Diagnosis Coding ICD-10 Coding Code Description [id number] Unspecified open wound of left forearm, initial encounter S51.801A Unspecified open wound of right forearm, initial encounter I10 Essential (primary) hypertension I87.2 Venous insufficiency (chronic) (peripheral) E11.9 Type 2 diabetes mellitus without complications [id number] Unspecified atrial fibrillation Follow-up Appointments ppointment in 1 week. - Dr. Heber Thomson Return A Bathing/ Shower/ Hygiene May shower and wash wound with soap and water. Wound Treatment Wound #1 - Forearm Wound Laterality: Left, Proximal Cleanser: Soap and Water 1 x Per [id number] Days Discharge Instructions: May shower and wash wound with dial antibacterial soap and water prior to dressing change. Prim Dressing: FIBRACOL Plus Dressing, 2x2 in (collagen) (DME) (Generic) 1 x Per Day/15 Days ary Discharge Instructions: Moisten collagen with saline or hydrogel Secondary Dressing: T Non-adherent Dressing, 2x3 in (DME) (Generic) 1 x Per Day/15 Days elfa Discharge Instructions: Apply over primary dressing as directed. Secured With: Child psychotherapist, Sterile 2x75 (in/in) (DME) (Generic) 1 x Per Day/15 Days Discharge Instructions:  Secure with stretch gauze as directed. Secured With: 32M Medipore H Soft Cloth Surgical T 4 x 2 (in/yd) (DME) (Generic) 1 x Per Day/15 Days ape Discharge Instructions: Secure dressing with tape as directed. Wound #2 - Forearm Wound Laterality: Left, Distal Cleanser: Soap and Water 1 x Per NWG/95 Days Discharge Instructions: May shower and wash wound with dial antibacterial soap and water prior to dressing change. Prim Dressing: FIBRACOL Plus Dressing, 2x2 in (collagen) (DME) (Generic) 1 x Per Day/15 Days ary Discharge Instructions: Moisten collagen with saline or hydrogel Secondary Dressing: T Non-adherent Dressing, 2x3 in  (DME) (Generic) 1 x Per Day/15 Days elfa Discharge Instructions: Apply over primary dressing as directed. Secured With: Child psychotherapist, Sterile 2x75 (in/in) (DME) (Generic) 1 x Per Day/15 Days Discharge Instructions: Secure with stretch gauze as directed. Secured With: 32M Medipore H Soft Cloth Surgical T 4 x 2 (in/yd) (DME) (Generic) 1 x Per Day/15 Days ape Discharge Instructions: Secure dressing with tape as directed. Wound #3 - Wrist Wound Laterality: Right Cleanser: Soap and Water 1 x Per AOZ/30 Days Discharge Instructions: May shower and wash wound with dial antibacterial soap and water prior to dressing change. Prim Dressing: FIBRACOL Plus Dressing, 2x2 in (collagen) (DME) (Generic) 1 x Per Day/15 Days ary Discharge Instructions: Moisten collagen with saline or hydrogel Secondary Dressing: T Non-adherent Dressing, 2x3 in (DME) (Generic) 1 x Per Day/15 Days elfa Discharge Instructions: Apply over primary dressing as directed. Secured With: Child psychotherapist, Sterile 2x75 (in/in) (DME) (Generic) 1 x Per Day/15 Days Discharge Instructions: Secure with stretch gauze as directed. Secured With: 32M Medipore H Soft Cloth Surgical T 4 x 2 (in/yd) (DME) (Generic) 1 x Per Day/15 Days ape Discharge Instructions: Secure dressing with tape as directed. Electronic Signature(s) Signed: 05/04/2020 11:05:31 AM By: Kalman Shan DO Entered By: Kalman Shan on 05/04/2020 10:58:55 -------------------------------------------------------------------------------- Problem List Details Patient Name: Date of Service: [place], [name] LD O. 05/04/2020 9:30 A M Medical Record Number: [id number] Patient Account Number: [id number] Date of Birth/Sex: Treating RN: [birth year] (87 y.o. Burnadette Pop, [name] Primary Care Provider: [name] Other Clinician: Referring Provider: Treating Provider/Extender: Judeth Porch in Treatment: 0 Active  Problems ICD-10 Encounter Code Description Active Date MDM Diagnosis S51.802A Unspecified open wound of left forearm, initial encounter 05/04/2020 No Yes S51.801A Unspecified open wound of right forearm, initial encounter 05/04/2020 No Yes I10 Essential (primary) hypertension 05/04/2020 No Yes I87.2 Venous insufficiency (chronic) (peripheral) 05/04/2020 No Yes E11.9 Type 2 diabetes mellitus without complications 02/10/5282 No Yes I48.91 Unspecified atrial fibrillation 05/04/2020 No Yes Inactive Problems Resolved Problems Electronic Signature(s) Signed: 05/04/2020 11:05:31 AM By: Kalman Shan DO Entered By: Kalman Shan on 05/04/2020 10:54:31 -------------------------------------------------------------------------------- Progress Note Details Patient Name: Date of Service: Tamala Julian, [name] LD O. 05/04/2020 9:30 A M Medical Record Number: [id number] Patient Account Number: [id number] Date of Birth/Sex: Treating RN: Sep 15, 1931 (87 y.o. Burnadette Pop, [name] Primary Care Provider: [name] Other Clinician: Referring Provider: Treating Provider/Extender: Judeth Porch in Treatment: 0 Subjective Chief Complaint Information obtained from Patient Wounds to the left forearm and right wrist History of Present Illness (HPI) Admission 5/3 Mr. Steuber is an 87 year old male with a past medical history of essential hypertension, type 2 diabetes on oral agents, atrial fibrillation on warfarin, and chronic venous insufficiency That presents today for 3 wounds. 2 are located to his left forearm and one is on the right wrist. He developed the most distal wound to his left forearm  1 month ago when he was reaching behind the headboard and developed a skin tear. This has been healing well for the past month. The other 2 wounds were developed after removing a Band-Aid from the area. These are also limited to skin tears. These developed about 1 week ago. He has been using a T pad  and Band-Aid to the wounds. He overall feels well denies any signs of infection. elfa Patient History Information obtained from Patient. Allergies No Known Drug Allergies Family History Diabetes - Father, Heart Disease - Father, Hypertension - Father, Stroke - Father, No family history of Cancer, Hereditary Spherocytosis, Kidney Disease, Lung Disease, Seizures, Thyroid Problems, Tuberculosis. Social History Former smoker - quit at 87 years old, Marital Status - Married, Alcohol Use - Never, Drug Use - No History, Caffeine Use - Rarely. Medical History Eyes Denies history of Cataracts, Glaucoma, Optic Neuritis Ear/Nose/Mouth/Throat Denies history of Chronic sinus problems/congestion, Middle ear problems Hematologic/Lymphatic Patient has history of Anemia Denies history of Hemophilia, Human Immunodeficiency Virus, Lymphedema, Sickle Cell Disease Respiratory Denies history of Aspiration, Asthma, Chronic Obstructive Pulmonary Disease (COPD), Pneumothorax, Sleep Apnea, Tuberculosis Cardiovascular Patient has history of Arrhythmia - A.fib, Hypertension, Peripheral Venous Disease Gastrointestinal Denies history of Cirrhosis , Colitis, Crohnoos, Hepatitis A, Hepatitis B, Hepatitis C Endocrine Patient has history of Type II Diabetes Immunological Denies history of Lupus Erythematosus, Raynaudoos, Scleroderma Integumentary (Skin) Denies history of History of Burn Musculoskeletal Patient has history of Gout Denies history of Rheumatoid Arthritis, Osteoarthritis, Osteomyelitis Neurologic Denies history of Dementia, Neuropathy, Quadriplegia, Paraplegia, Seizure Disorder Oncologic Denies history of Received Chemotherapy, Received Radiation Psychiatric Patient has history of Confinement Anxiety Denies history of Anorexia/bulimia Patient is treated with Controlled Diet. Blood sugar is not tested. Hospitalization/Surgery History - 2017 GI bleed. Medical A Surgical History  Notes nd Cardiovascular hyperlipidemia Gastrointestinal chronic constipation Genitourinary Stage II kidney Disease Musculoskeletal osteopenia Review of Systems (ROS) Constitutional Symptoms (General Health) Denies complaints or symptoms of Fatigue, Fever, Chills, Marked Weight Change. Eyes Complains or has symptoms of Glasses / Contacts - glasses. Ear/Nose/Mouth/Throat Denies complaints or symptoms of Chronic sinus problems or rhinitis. Respiratory Denies complaints or symptoms of Chronic or frequent coughs, Shortness of Breath. Cardiovascular Denies complaints or symptoms of Chest pain. Gastrointestinal Denies complaints or symptoms of Frequent diarrhea, Nausea, Vomiting. Endocrine Denies complaints or symptoms of Heat/cold intolerance. Genitourinary Denies complaints or symptoms of Frequent urination. Integumentary (Skin) Complains or has symptoms of Wounds - left forearm. Musculoskeletal Denies complaints or symptoms of Muscle Pain, Muscle Weakness. Neurologic Denies complaints or symptoms of Numbness/parasthesias. Psychiatric Denies complaints or symptoms of Claustrophobia, Suicidal. Objective Constitutional respirations regular, non-labored and within target range for patient.. Vitals Time Taken: 9:48 AM, Height: 69 in, Source: Stated, Weight: 213 lbs, Source: Stated, BMI: 31.5, Temperature: 97.6 F, Pulse: 67 bpm, Respiratory Rate: 18 breaths/min, Blood Pressure: 132/76 mmHg. Psychiatric pleasant and cooperative. General Notes: Left arm: Patient has 2 open wounds with excellent granulation tissue present. There is nonviable tissue to the distal wound and this was debrided. No signs of infection. Right wrist: Small open wound with excellent granulation tissue present. No signs of infection. Integumentary (Hair, Skin) Wound #1 status is Open. Original cause of wound was Trauma. The date acquired was: 04/02/2020. The wound is located on the Left,Proximal Forearm.  The wound measures 2.8cm length x 3.4cm width x 0.1cm depth; 7.477cm^2 area and 0.748cm^3 volume. There is Fat Layer (Subcutaneous Tissue) exposed. There is no tunneling or undermining noted. There is a medium amount of serosanguineous  drainage noted. The wound margin is distinct with the outline attached to the wound base. There is large (67-100%) red granulation within the wound bed. There is no necrotic tissue within the wound bed. Wound #2 status is Open. Original cause of wound was Trauma. The date acquired was: 04/02/2020. The wound is located on the Left,Distal Forearm. The wound measures 3cm length x 2.3cm width x 0.1cm depth; 5.419cm^2 area and 0.542cm^3 volume. There is Fat Layer (Subcutaneous Tissue) exposed. There is no tunneling or undermining noted. There is a medium amount of serosanguineous drainage noted. The wound margin is distinct with the outline attached to the wound base. There is large (67-100%) red granulation within the wound bed. There is no necrotic tissue within the wound bed. Wound #3 status is Open. Original cause of wound was Trauma. The date acquired was: 04/02/2020. The wound is located on the Right Wrist. The wound measures 0.5cm length x 0.2cm width x 0.1cm depth; 0.079cm^2 area and 0.008cm^3 volume. There is Fat Layer (Subcutaneous Tissue) exposed. There is no tunneling or undermining noted. There is a medium amount of serosanguineous drainage noted. The wound margin is distinct with the outline attached to the wound base. There is large (67-100%) red granulation within the wound bed. There is no necrotic tissue within the wound bed. Assessment Active Problems ICD-10 Unspecified open wound of left forearm, initial encounter Unspecified open wound of right forearm, initial encounter Essential (primary) hypertension Venous insufficiency (chronic) (peripheral) Type 2 diabetes mellitus without complications Unspecified atrial fibrillation Patient's wounds appear  well-healing and have excellent granulation tissue present I think he would benefit from collagen daily. We will not use any adhesive and wrap with Kerlix. There is some nonviable tissue present and this was debrided. No signs of infection. He can follow-up in 1 week. Procedures Wound #1 Pre-procedure diagnosis of Wound #1 is a Skin T located on the Left,Proximal Forearm . There was a Chemical/Enzymatic/Mechanical debridement ear performed by Deon Pilling, RN.. Other agent used was gauze and wound cleanser. A Minimum amount of bleeding was controlled with N/A. The procedure was tolerated well. Post Debridement Measurements: 2.8cm length x 3.4cm width x 0.1cm depth; 0.748cm^3 volume. Character of Wound/Ulcer Post Debridement is improved. Post procedure Diagnosis Wound #1: Same as Pre-Procedure Wound #2 Pre-procedure diagnosis of Wound #2 is a Skin T located on the Left,Distal Forearm . There was a Selective/Open Wound Skin/Epidermis Debridement with ear a total area of 8.75 sq cm performed by Kalman Shan, DO. With the following instrument(s): Forceps, and Scissors to remove Non-Viable tissue/material. Material removed includes Eschar and Skin: Epidermis and after achieving pain control using Lidocaine 4% T opical Solution. A time out was conducted at 10:28, prior to the start of the procedure. There was no bleeding. The procedure was tolerated well with a pain level of 0 throughout and a pain level of 0 following the procedure. Post Debridement Measurements: 3cm length x 2.3cm width x 0.1cm depth; 0.542cm^3 volume. Character of Wound/Ulcer Post Debridement is improved. Post procedure Diagnosis Wound #2: Same as Pre-Procedure Wound #3 Pre-procedure diagnosis of Wound #3 is a Skin T located on the Right Wrist . There was a Chemical/Enzymatic/Mechanical debridement performed by ear Deon Pilling, RN.. Other agent used was gauze and wound cleanser. A Minimum amount of bleeding was controlled  with N/A. The procedure was tolerated well. Post Debridement Measurements: 0.5cm length x 0.2cm width x 0.1cm depth; 0.008cm^3 volume. Character of Wound/Ulcer Post Debridement is improved. Post procedure Diagnosis Wound #3: Same as  Pre-Procedure Plan Follow-up Appointments: Return Appointment in 1 week. - Dr. Heber [place] Bathing/ Shower/ Hygiene: May shower and wash wound with soap and water. WOUND #1: - Forearm Wound Laterality: Left, Proximal Cleanser: Soap and Water 1 x Per Day/15 Days Discharge Instructions: May shower and wash wound with dial antibacterial soap and water prior to dressing change. Prim Dressing: FIBRACOL Plus Dressing, 2x2 in (collagen) (DME) (Generic) 1 x Per Day/15 Days ary Discharge Instructions: Moisten collagen with saline or hydrogel Secondary Dressing: T Non-adherent Dressing, 2x3 in (DME) (Generic) 1 x Per Day/15 Days elfa Discharge Instructions: Apply over primary dressing as directed. Secured With: Child psychotherapist, Sterile 2x75 (in/in) (DME) (Generic) 1 x Per Day/15 Days Discharge Instructions: Secure with stretch gauze as directed. Secured With: 4M Medipore H Soft Cloth Surgical T 4 x 2 (in/yd) (DME) (Generic) 1 x Per Day/15 Days ape Discharge Instructions: Secure dressing with tape as directed. WOUND #2: - Forearm Wound Laterality: Left, Distal Cleanser: Soap and Water 1 x Per BSW/96 Days Discharge Instructions: May shower and wash wound with dial antibacterial soap and water prior to dressing change. Prim Dressing: FIBRACOL Plus Dressing, 2x2 in (collagen) (DME) (Generic) 1 x Per Day/15 Days ary Discharge Instructions: Moisten collagen with saline or hydrogel Secondary Dressing: T Non-adherent Dressing, 2x3 in (DME) (Generic) 1 x Per Day/15 Days elfa Discharge Instructions: Apply over primary dressing as directed. Secured With: Child psychotherapist, Sterile 2x75 (in/in) (DME) (Generic) 1 x Per Day/15 Days Discharge  Instructions: Secure with stretch gauze as directed. Secured With: 4M Medipore H Soft Cloth Surgical T 4 x 2 (in/yd) (DME) (Generic) 1 x Per Day/15 Days ape Discharge Instructions: Secure dressing with tape as directed. WOUND #3: - Wrist Wound Laterality: Right Cleanser: Soap and Water 1 x Per PRF/16 Days Discharge Instructions: May shower and wash wound with dial antibacterial soap and water prior to dressing change. Prim Dressing: FIBRACOL Plus Dressing, 2x2 in (collagen) (DME) (Generic) 1 x Per Day/15 Days ary Discharge Instructions: Moisten collagen with saline or hydrogel Secondary Dressing: T Non-adherent Dressing, 2x3 in (DME) (Generic) 1 x Per Day/15 Days elfa Discharge Instructions: Apply over primary dressing as directed. Secured With: Child psychotherapist, Sterile 2x75 (in/in) (DME) (Generic) 1 x Per Day/15 Days Discharge Instructions: Secure with stretch gauze as directed. Secured With: 4M Medipore H Soft Cloth Surgical T 4 x 2 (in/yd) (DME) (Generic) 1 x Per Day/15 Days ape Discharge Instructions: Secure dressing with tape as directed. 1. Collagen daily with T pad and Kerlix wrap elfa 2. Mechanical debridement 3. Follow-up in 1 week Electronic Signature(s) Signed: 05/04/2020 11:05:31 AM By: Kalman Shan DO Entered By: Kalman Shan on 05/04/2020 11:01:09 -------------------------------------------------------------------------------- HxROS Details Patient Name: Date of Service: Tamala Julian, [name] LD O. 05/04/2020 9:30 A M Medical Record Number: [id number] Patient Account Number: [id number] Date of Birth/Sex: Treating RN: [birth year] (87 y.o. [name] Primary Care Provider: [name] Other Clinician: Referring Provider: Treating Provider/Extender: Judeth Porch in Treatment: 0 Information Obtained From Patient Constitutional Symptoms (General Health) Complaints and Symptoms: Negative for: Fatigue; Fever; Chills;  Marked Weight Change Eyes Complaints and Symptoms: Positive for: Glasses / Contacts - glasses Medical History: Negative for: Cataracts; Glaucoma; Optic Neuritis Ear/Nose/Mouth/Throat Complaints and Symptoms: Negative for: Chronic sinus problems or rhinitis Medical History: Negative for: Chronic sinus problems/congestion; Middle ear problems Respiratory Complaints and Symptoms: Negative for: Chronic or frequent coughs; Shortness of Breath Medical History: Negative for: Aspiration; Asthma; Chronic Obstructive Pulmonary Disease (COPD);  Pneumothorax; Sleep Apnea; Tuberculosis Cardiovascular Complaints and Symptoms: Negative for: Chest pain Medical History: Positive for: Arrhythmia - A.fib; Hypertension; Peripheral Venous Disease Past Medical History Notes: hyperlipidemia Gastrointestinal Complaints and Symptoms: Negative for: Frequent diarrhea; Nausea; Vomiting Medical History: Negative for: Cirrhosis ; Colitis; Crohns; Hepatitis A; Hepatitis B; Hepatitis C Past Medical History Notes: chronic constipation Endocrine Complaints and Symptoms: Negative for: Heat/cold intolerance Medical History: Positive for: Type II Diabetes Time with diabetes: 15 years ago Treated with: Diet Blood sugar tested every day: No Genitourinary Complaints and Symptoms: Negative for: Frequent urination Medical History: Past Medical History Notes: Stage II kidney Disease Integumentary (Skin) Complaints and Symptoms: Positive for: Wounds - left forearm Medical History: Negative for: History of Burn Musculoskeletal Complaints and Symptoms: Negative for: Muscle Pain; Muscle Weakness Medical History: Positive for: Gout Negative for: Rheumatoid Arthritis; Osteoarthritis; Osteomyelitis Past Medical History Notes: osteopenia Neurologic Complaints and Symptoms: Negative for: Numbness/parasthesias Medical History: Negative for: Dementia; Neuropathy; Quadriplegia; Paraplegia; Seizure  Disorder Psychiatric Complaints and Symptoms: Negative for: Claustrophobia; Suicidal Medical History: Positive for: Confinement Anxiety Negative for: Anorexia/bulimia Hematologic/Lymphatic Medical History: Positive for: Anemia Negative for: Hemophilia; Human Immunodeficiency Virus; Lymphedema; Sickle Cell Disease Immunological Medical History: Negative for: Lupus Erythematosus; Raynauds; Scleroderma Oncologic Medical History: Negative for: Received Chemotherapy; Received Radiation Immunizations Pneumococcal Vaccine: Received Pneumococcal Vaccination: No Implantable Devices No devices added Hospitalization / Surgery History Type of Hospitalization/Surgery 2017 GI bleed Family and Social History Cancer: No; Diabetes: Yes - Father; Heart Disease: Yes - Father; Hereditary Spherocytosis: No; Hypertension: Yes - Father; Kidney Disease: No; Lung Disease: No; Seizures: No; Stroke: Yes - Father; Thyroid Problems: No; Tuberculosis: No; Former smoker - quit at 87 years old; Marital Status - Married; Alcohol Use: Never; Drug Use: No History; Caffeine Use: Rarely; Financial Concerns: No; Food, Clothing or Shelter Needs: No; Support System Lacking: No; Transportation Concerns: No Electronic Signature(s) Signed: 05/04/2020 11:05:31 AM By: Kalman Shan DO Signed: 05/05/2020 6:54:10 PM By: Deon Pilling Entered By: Deon Pilling on 05/04/2020 10:06:55 -------------------------------------------------------------------------------- SuperBill Details Patient Name: Date of Service: [place], [name] LD O. 05/04/2020 Medical Record Number: [id number] Patient Account Number: [id number] Date of Birth/Sex: Treating RN: [birth year] (87 y.o. Burnadette Pop, [name] Primary Care Provider: [name] Other Clinician: Referring Provider: Treating Provider/Extender: Judeth Porch in Treatment: 0 Diagnosis Coding ICD-10 Codes Code Description [phone] Unspecified open wound  of left forearm, initial encounter S51.801A Unspecified open wound of right forearm, initial encounter I10 Essential (primary) hypertension I87.2 Venous insufficiency (chronic) (peripheral) E11.9 Type 2 diabetes mellitus without complications [id number] Unspecified atrial fibrillation Facility Procedures CPT4 Code: [id number] Description: [facility] VISIT-LEV 3 EST PT Modifier: Quantity: 1 CPT4 Code: [id number] Description: [id number] - [place]BR[place] W/O ANES NON SELECT Modifier: Quantity: 1 CPT4 Code: [id number] Description: [id number] - [place] WOUND 1ST 20 SQ CM OR < ICD-10 Diagnosis Description S51.802A Unspecified open wound of left forearm, initial encounter S51.801A Unspecified open wound of right forearm, initial encounter Modifier: Quantity: 1 Physician Procedures : CPT4 Code Description Modifier [id number] WC PHYS LEVEL 3 NEW PT ICD-10 Diagnosis Description S51.802A Unspecified open wound of left forearm, initial encounter S51.801A Unspecified open wound of right forearm, initial encounter Quantity: 1 : [id number] - WC PHYS DEBR WO ANESTH 20 SQ CM ICD-10 Diagnosis Description S51.802A Unspecified open wound of left forearm, initial encounter S51.801A Unspecified open wound of right forearm, initial encounter Quantity: 1 Electronic Signature(s) Signed: 05/05/2020 6:54:10 PM By: Deon Pilling Signed: 05/11/2020 3:23:56 PM By: Kalman Shan DO Previous Signature: 05/04/2020  11:05:31 AM Version By: Kalman Shan DO Entered By: Deon Pilling on 05/04/2020 12:46:09

## 2020-05-12 ENCOUNTER — Telehealth: Payer: Self-pay

## 2020-05-12 NOTE — Progress Notes (Signed)
YORDY, MATTON ([id number]) Visit Report for 05/11/2020 Arrival Information Details Patient Name: Date of Service: [place], [place] LD O. 05/11/2020 8:30 A M Medical Record Number: [id number] Patient Account Number: [id number] Date of Birth/Sex: Treating RN: 11/20/1931 (87 y.o. [name] Primary Care Billey Wojciak: [name] Other Clinician: Referring Jessenia Filippone: Treating Hershey Knauer/Extender: Judeth Porch in Treatment: 1 Visit Information History Since Last Visit Added or deleted any medications: No Patient Arrived: Kasandra Knudsen Any new allergies or adverse reactions: No Arrival Time: 08:36 Had a fall or experienced change in No Accompanied By: wife activities of daily living that may affect Transfer Assistance: None risk of falls: Patient Identification Verified: Yes Signs or symptoms of abuse/neglect since last visito No Secondary Verification Process Completed: Yes Hospitalized since last visit: No Patient Requires Transmission-Based Precautions: No Implantable device outside of the clinic excluding No Patient Has Alerts: Yes cellular tissue based products placed in the center Patient Alerts: Patient on Blood Thinner since last visit: Has Dressing in Place as Prescribed: Yes Pain Present Now: No Electronic Signature(s) Signed: 05/11/2020 8:51:56 AM By: Sandre Kitty Entered By: Sandre Kitty on 05/11/2020 08:40:42 -------------------------------------------------------------------------------- Clinic Level of Care Assessment Details Patient Name: Date of Service: [place], [place] LD O. 05/11/2020 8:30 A M Medical Record Number: [id number] Patient Account Number: [id number] Date of Birth/Sex: Treating RN: 11/12/1931 (87 y.o. [name], Meta.Reding Primary Care Elizah Mierzwa: [name] Other Clinician: Referring Rozell Kettlewell: Treating Suad Autrey/Extender: Judeth Porch in Treatment: 1 Clinic Level of Care Assessment Items TOOL 4  Quantity Score X- 1 0 Use when only an EandM is performed on FOLLOW-UP visit ASSESSMENTS - Nursing Assessment / Reassessment X- 1 10 Reassessment of Co-morbidities (includes updates in patient status) X- 1 5 Reassessment of Adherence to Treatment Plan ASSESSMENTS - Wound and Skin A ssessment / Reassessment _0  - 0 Simple Wound Assessment / Reassessment - one wound X- 3 5 Complex Wound Assessment / Reassessment - multiple wounds X- 1 10 Dermatologic / Skin Assessment (not related to wound area) ASSESSMENTS - Focused Assessment _1  - 0 Circumferential Edema Measurements - multi extremities X- 1 10 Nutritional Assessment / Counseling / Intervention _2  - 0 Lower Extremity Assessment (monofilament, tuning fork, pulses) _3  - 0 Peripheral Arterial Disease Assessment (using hand held doppler) ASSESSMENTS - Ostomy and/or Continence Assessment and Care _4  - 0 Incontinence Assessment and Management _5  - 0 Ostomy Care Assessment and Management (repouching, etc.) PROCESS - Coordination of Care _6  - 0 Simple Patient / Family Education for ongoing care X- 1 20 Complex (extensive) Patient / Family Education for ongoing care X- 1 10 Staff obtains Programmer, systems, Records, T Results / Process Orders est _7  - 0 Staff telephones HHA, Nursing Homes / Clarify orders / etc _8  - 0 Routine Transfer to another Facility (non-emergent condition) _9  - 0 Routine Hospital Admission (non-emergent condition) _10  - 0 New Admissions / Biomedical engineer / Ordering NPWT Apligraf, etc. , _11  - 0 Emergency Hospital Admission (emergent condition) _12  - 0 Simple Discharge Coordination X- 1 15 Complex (extensive) Discharge Coordination PROCESS - Special Needs _13  - 0 Pediatric / Minor Patient Management _14  - 0 Isolation Patient Management _15  - 0 Hearing / Language / Visual special needs _16  - 0 Assessment of Community assistance (transportation, D/C planning, etc.) _17  - 0 Additional assistance / Altered  mentation _18  - 0 Support Surface(s) Assessment (bed, cushion, seat, etc.) INTERVENTIONS - Wound Cleansing / Measurement _19  - 0 Simple Wound Cleansing - one wound X- 3 5 Complex  Wound Cleansing - multiple wounds X- 1 5 Wound Imaging (photographs - any number of wounds) _0  - 0 Wound Tracing (instead of photographs) _1  - 0 Simple Wound Measurement - one wound X- 3 5 Complex Wound Measurement - multiple wounds INTERVENTIONS - Wound Dressings X - Small Wound Dressing one or multiple wounds 1 10 _2  - 0 Medium Wound Dressing one or multiple wounds _3  - 0 Large Wound Dressing one or multiple wounds <ZOXWRUEAVWUJWJXB>_1<\/YNWGNFAOZHYQMVHQ>_4  - 0 Application of Medications - topical <ONGEXBMWUXLKGMWN>_0<\/UVOZDGUYQIHKVQQV>_9  - 0 Application of Medications - injection INTERVENTIONS - Miscellaneous _6  - 0 External ear exam _7  - 0 Specimen Collection (cultures, biopsies, blood, body fluids, etc.) _8  - 0 Specimen(s) / Culture(s) sent or taken to Lab for analysis _9  - 0 Patient Transfer (multiple staff / Civil Service fast streamer / Similar devices) _10  - 0 Simple Staple / Suture removal (25 or less) _11  - 0 Complex Staple / Suture removal (26 or more) _12  - 0 Hypo / Hyperglycemic Management (close monitor of Blood Glucose) _13  - 0 Ankle / Brachial Index (ABI) - do not check if billed separately X- 1 5 Vital Signs Has the patient been seen at the hospital within the last three years: Yes Total Score: 145 Level Of Care: New/Established - Level 4 Electronic Signature(s) Signed: 05/11/2020 6:10:24 PM By: Deon Pilling Entered By: Deon Pilling on 05/11/2020 09:20:13 -------------------------------------------------------------------------------- Encounter Discharge Information Details Patient Name: Date of Service: [place], [name] LD O. 05/11/2020 8:30 A M Medical Record Number: [id number] Patient Account Number: [id number] Date of Birth/Sex: Treating RN: July 13, 1931 (87 y.o. Marcheta Grammes Primary Care Santonio Speakman: [name] Other Clinician: Referring Keng Jewel: Treating  Olevia Westervelt/Extender: Judeth Porch in Treatment: 1 Encounter Discharge Information Items Discharge Condition: Stable Ambulatory Status: Cane Discharge Destination: Home Transportation: Private Auto Accompanied By: wife Schedule Follow-up Appointment: Yes Clinical Summary of Care: Provided on 05/11/2020 Form Type Recipient Paper Patient Patient Electronic Signature(s) Signed: 05/11/2020 10:02:42 AM By: Lorrin Jackson Previous Signature: 05/11/2020 10:02:18 AM Version By: Lorrin Jackson Entered By: Lorrin Jackson on 05/11/2020 10:02:42 -------------------------------------------------------------------------------- Lower Extremity Assessment Details Patient Name: Date of Service: [place], [name] LD O. 05/11/2020 8:30 A M Medical Record Number: [id number] Patient Account Number: [id number] Date of Birth/Sex: Treating RN: [birth year] (87 y.o. Marcheta Grammes Primary Care Delonda Coley: [name] Other Clinician: Referring Jakolby Sedivy: Treating Emmilynn Marut/Extender: Genoveva Ill Weeks in Treatment: 1 Electronic Signature(s) Signed: 05/11/2020 10:01:29 AM By: Lorrin Jackson Entered By: Lorrin Jackson on 05/11/2020 10:01:29 -------------------------------------------------------------------------------- Multi Wound Chart Details Patient Name: Date of Service: [place], [place] NA LD O. 05/11/2020 8:30 A M Medical Record Number: [id number] Patient Account Number: [id number] Date of Birth/Sex: Treating RN: 10-05-1931 (87 y.o. [name], Meta.Reding Primary Care Jacqlyn Marolf: [name] Other Clinician: Referring Magaline Steinberg: Treating Gabriellah Rabel/Extender: Judeth Porch in Treatment: 1 Vital Signs Height(in): 69 Pulse(bpm): 46 Weight(lbs): 213 Blood Pressure(mmHg): 153/65 Body Mass Index(BMI): 31 Temperature(F): 98.3 Respiratory Rate(breaths/min): 18 Photos: [1:No Photos Left, Proximal Forearm] [2:No Photos Left, Distal Forearm]  [3:No Photos Right Wrist] Wound Location: [1:Trauma] [2:Trauma] [3:Trauma] Wounding Event: [1:Skin T ear] [2:Skin T ear] [3:Skin T ear] Primary Etiology: [1:Anemia, Arrhythmia, Hypertension, Anemia, Arrhythmia, Hypertension,] [3:Anemia, Arrhythmia, Hypertension,] Comorbid History: [1:Peripheral Venous Disease, Type II Peripheral Venous Disease, Type II Diabetes, Gout, Confinement Anxiety Diabetes, Gout, Confinement Anxiety 04/02/2020] [2:04/02/2020] [3:Peripheral Venous Disease, Type II Diabetes, Gout, Confinement  Anxiety 04/02/2020] Date Acquired: [1:1] [2:1] [3:1] Weeks of Treatment: [1:Open] [2:Healed - Epithelialized] [3:Open] Wound Status: [1:0.8x0.8x0.1] [2:0x0x0] [3:0x0x0] Measurements L x W  x D (cm) [1:0.503] [2:0] [3:0] A (cm) : rea [1:0.05] [2:0] [3:0] Volume (cm) : [1:93.30%] [2:100.00%] [3:100.00%] % Reduction in Area: [1:93.30%] [2:100.00%] [3:100.00%] % Reduction in Volume: [1:Full Thickness Without Exposed] [2:Full Thickness Without Exposed] [3:Full Thickness Without Exposed] Classification: [1:Support Structures Medium] [2:Support Structures None Present] [3:Support Structures None Present] Exudate Amount: [1:Serosanguineous] [2:N/A] [3:N/A] Exudate Type: [1:red, brown] [2:N/A] [3:N/A] Exudate Color: [1:Flat and Intact] [2:N/A] [3:Flat and Intact] Wound Margin: [1:Large (67-100%)] [2:None Present (0%)] [3:None Present (0%)] Granulation Amount: [1:Red, Friable] [2:N/A] [3:N/A] Granulation Quality: [1:None Present (0%)] [2:None Present (0%)] [3:None Present (0%)] Necrotic Amount: [1:Fat Layer (Subcutaneous Tissue): Yes Fascia: No] [3:Fat Layer (Subcutaneous Tissue): Yes] Exposed Structures: [1:Fascia: No Tendon: No Muscle: No Joint: No Bone: No Small (1-33%)] [2:Fat Layer (Subcutaneous Tissue): No Tendon: No Muscle: No Joint: No Bone: No Large (67-100%)] [3:Fascia: No Tendon: No Muscle: No Joint: No Bone: No Small (1-33%)] Epithelialization: [1:N/A] [2:N/A] [3:scab  covered] Treatment Notes Wound #1 (Forearm) Wound Laterality: Left, Proximal Cleanser Soap and Water Discharge Instruction: May shower and wash wound with dial antibacterial soap and water prior to dressing change. Peri-Wound Care Topical Primary Dressing FIBRACOL Plus Dressing, 2x2 in (collagen) Discharge Instruction: Moisten collagen with saline or hydrogel Secondary Dressing T Non-adherent Dressing, 2x3 in elfa Discharge Instruction: Apply over primary dressing as directed. Secured With Conforming Stretch Gauze Bandage, Sterile 2x75 (in/in) Discharge Instruction: Secure with stretch gauze as directed. 53M Medipore H Soft Cloth Surgical T 4 x 2 (in/yd) ape Discharge Instruction: Secure dressing with tape as directed. Compression Wrap Compression Stockings Add-Ons Wound #3 (Wrist) Wound Laterality: Right Cleanser Peri-Wound Care Topical Primary Dressing Secondary Dressing Secured With Compression Wrap Compression Stockings Add-Ons Electronic Signature(s) Signed: 05/11/2020 10:31:46 AM By: Kalman Shan DO Signed: 05/11/2020 6:10:24 PM By: Deon Pilling Entered By: Kalman Shan on 05/11/2020 10:17:36 -------------------------------------------------------------------------------- Multi-Disciplinary Care Plan Details Patient Name: Date of Service: [place], [place] NA LD O. 05/11/2020 8:30 A M Medical Record Number: [id number] Patient Account Number: [id number] Date of Birth/Sex: Treating RN: 1931-04-18 (87 y.o. [name], Tammi Klippel Primary Care Kessa Fairbairn: [name] Other Clinician: Referring Twila Rappa: Treating Vanice Rappa/Extender: Judeth Porch in Treatment: 1 Active Inactive Abuse / Safety / Falls / Self Care Management Nursing Diagnoses: History of Falls Potential for falls Goals: Patient/caregiver will verbalize understanding of skin care regimen Date Initiated: 05/04/2020 Target Resolution Date: 06/04/2020 Goal Status:  Active Interventions: Assess self care needs on admission and as needed Provide education on fall prevention Notes: Pain, Acute or Chronic Nursing Diagnoses: Potential alteration in comfort, pain Goals: Patient will verbalize adequate pain control and receive pain control interventions during procedures as needed Date Initiated: 05/04/2020 Target Resolution Date: 06/04/2020 Goal Status: Active Patient/caregiver will verbalize comfort level met Date Initiated: 05/04/2020 Target Resolution Date: 06/25/2020 Goal Status: Active Interventions: Provide education on pain management Reposition patient for comfort Notes: Wound/Skin Impairment Nursing Diagnoses: Knowledge deficit related to ulceration/compromised skin integrity Goals: Patient/caregiver will verbalize understanding of skin care regimen Date Initiated: 05/04/2020 Target Resolution Date: 07/02/2020 Goal Status: Active Interventions: Assess patient/caregiver ability to obtain necessary supplies Assess patient/caregiver ability to perform ulcer/skin care regimen upon admission and as needed Provide education on ulcer and skin care Treatment Activities: Skin care regimen initiated : 05/04/2020 Topical wound management initiated : 05/04/2020 Notes: Electronic Signature(s) Signed: 05/11/2020 6:10:24 PM By: Deon Pilling Entered By: Deon Pilling on 05/11/2020 09:18:39 -------------------------------------------------------------------------------- Pain Assessment Details Patient Name: Date of Service: [place], [name] LD O. 05/11/2020 8:30 A M Medical Record Number:  [id number] Patient Account Number: [id number] Date of Birth/Sex: Treating RN: August 20, 1931 (87 y.o. [name] Primary Care Avika Carbine: [name] Other Clinician: Referring Isiah Scheel: Treating Shell Blanchette/Extender: Judeth Porch in Treatment: 1 Active Problems Location of Pain Severity and Description of Pain Patient Has Paino No Site  Locations Rate the pain. Current Pain Level: 0 Pain Management and Medication Current Pain Management: Electronic Signature(s) Signed: 05/11/2020 5:48:51 PM By: Baruch Gouty RN, BSN Signed: 05/11/2020 6:10:24 PM By: Deon Pilling Previous Signature: 05/11/2020 8:51:56 AM Version By: Sandre Kitty Entered By: Baruch Gouty on 05/11/2020 09:01:06 -------------------------------------------------------------------------------- Patient/Caregiver Education Details Patient Name: Date of Service: [place] LD Jenetta Downer 5/10/2022andnbsp8:30 A M Medical Record Number: [id number] Patient Account Number: [id number] Date of Birth/Gender: Treating RN: [birth year] (87 y.o. [name] Primary Care Physician: [name] Other Clinician: Referring Physician: Treating Physician/Extender: Judeth Porch in Treatment: 1 Education Assessment Education Provided To: Patient and Caregiver Education Topics Provided Pain: Handouts: A Guide to Pain Control Methods: Explain/Verbal Responses: Reinforcements needed Electronic Signature(s) Signed: 05/11/2020 6:10:24 PM By: Deon Pilling Entered By: Deon Pilling on 05/11/2020 09:18:59 -------------------------------------------------------------------------------- Wound Assessment Details Patient Name: Date of Service: [place], [name] LD O. 05/11/2020 8:30 A M Medical Record Number: [id number] Patient Account Number: [id number] Date of Birth/Sex: Treating RN: 05-04-1931 (87 y.o. [name], Meta.Reding Primary Care Rakisha Pincock: [name] Other Clinician: Referring Densel Kronick: Treating Tabria Steines/Extender: Judeth Porch in Treatment: 1 Wound Status Wound Number: 1 Primary Skin Tear Etiology: Wound Location: Left, Proximal Forearm Wound Open Wounding Event: Trauma Status: Date Acquired: 04/02/2020 Comorbid Anemia, Arrhythmia, Hypertension, Peripheral Venous Disease, Weeks Of Treatment:  1 History: Type II Diabetes, Gout, Confinement Anxiety Clustered Wound: No Photos Wound Measurements Length: (cm) 0.8 Width: (cm) 0.8 Depth: (cm) 0.1 Area: (cm) 0.503 Volume: (cm) 0.05 % Reduction in Area: 93.3% % Reduction in Volume: 93.3% Epithelialization: Small (1-33%) Tunneling: No Undermining: No Wound Description Classification: Full Thickness Without Exposed Support Structures Wound Margin: Flat and Intact Exudate Amount: Medium Exudate Type: Serosanguineous Exudate Color: red, brown Foul Odor After Cleansing: No Slough/Fibrino No Wound Bed Granulation Amount: Large (67-100%) Exposed Structure Granulation Quality: Red, Friable Fascia Exposed: No Necrotic Amount: None Present (0%) Fat Layer (Subcutaneous Tissue) Exposed: Yes Tendon Exposed: No Muscle Exposed: No Joint Exposed: No Bone Exposed: No Treatment Notes Wound #1 (Forearm) Wound Laterality: Left, Proximal Cleanser Soap and Water Discharge Instruction: May shower and wash wound with dial antibacterial soap and water prior to dressing change. Peri-Wound Care Topical Primary Dressing FIBRACOL Plus Dressing, 2x2 in (collagen) Discharge Instruction: Moisten collagen with saline or hydrogel Secondary Dressing T Non-adherent Dressing, 2x3 in elfa Discharge Instruction: Apply over primary dressing as directed. Secured With Conforming Stretch Gauze Bandage, Sterile 2x75 (in/in) Discharge Instruction: Secure with stretch gauze as directed. 50M Medipore H Soft Cloth Surgical T 4 x 2 (in/yd) ape Discharge Instruction: Secure dressing with tape as directed. Compression Wrap Compression Stockings Add-Ons Electronic Signature(s) Signed: 05/11/2020 6:10:24 PM By: Deon Pilling Signed: 05/12/2020 9:46:23 AM By: Sandre Kitty Previous Signature: 05/11/2020 8:51:56 AM Version By: Sandre Kitty Entered By: Sandre Kitty on 05/11/2020  16:04:22 -------------------------------------------------------------------------------- Wound Assessment Details Patient Name: Date of Service: [place], [place] NA LD O. 05/11/2020 8:30 A M Medical Record Number: [id number] Patient Account Number: [id number] Date of Birth/Sex: Treating RN: 1931-08-31 (87 y.o. [name] Primary Care Zehra Rucci: [name] Other Clinician: Referring Arnice Vanepps: Treating Ashland Wiseman/Extender: Genoveva Ill Weeks in Treatment: 1 Wound Status Wound Number: 2  Primary Skin Tear Etiology: Wound Location: Left, Distal Forearm Wound Healed - Epithelialized Wounding Event: Trauma Status: Date Acquired: 04/02/2020 Comorbid Anemia, Arrhythmia, Hypertension, Peripheral Venous Disease, Weeks Of Treatment: 1 History: Type II Diabetes, Gout, Confinement Anxiety Clustered Wound: No Wound Measurements Length: (cm) Width: (cm) Depth: (cm) Area: (cm) Volume: (cm) 0 % Reduction in Area: 100% 0 % Reduction in Volume: 100% 0 Epithelialization: Large (67-100%) 0 Tunneling: No 0 Undermining: No Wound Description Classification: Full Thickness Without Exposed Support Structures Exudate Amount: None Present Foul Odor After Cleansing: No Slough/Fibrino No Wound Bed Granulation Amount: None Present (0%) Exposed Structure Necrotic Amount: None Present (0%) Fascia Exposed: No Fat Layer (Subcutaneous Tissue) Exposed: No Tendon Exposed: No Muscle Exposed: No Joint Exposed: No Bone Exposed: No Electronic Signature(s) Signed: 05/11/2020 5:48:51 PM By: Baruch Gouty RN, BSN Signed: 05/11/2020 6:10:24 PM By: Deon Pilling Previous Signature: 05/11/2020 8:51:56 AM Version By: Sandre Kitty Entered By: Baruch Gouty on 05/11/2020 09:03:52 -------------------------------------------------------------------------------- Wound Assessment Details Patient Name: Date of Service: [place], [name] LD O. 05/11/2020 8:30 A M Medical Record Number:  [id number] Patient Account Number: [id number] Date of Birth/Sex: Treating RN: April 02, 1931 (87 y.o. [name], Meta.Reding Primary Care Alexxia Stankiewicz: [name] Other Clinician: Referring Trevon Strothers: Treating Laura-Lee Villegas/Extender: Judeth Porch in Treatment: 1 Wound Status Wound Number: 3 Primary Skin Tear Etiology: Wound Location: Right Wrist Wound Open Wounding Event: Trauma Status: Date Acquired: 04/02/2020 Comorbid Anemia, Arrhythmia, Hypertension, Peripheral Venous Disease, Weeks Of Treatment: 1 History: Type II Diabetes, Gout, Confinement Anxiety History: Type II Diabetes, Gout, Confinement Anxiety Clustered Wound: No Wound Measurements Length: (cm) Width: (cm) Depth: (cm) Area: (cm) Volume: (cm) 0 % Reduction in Area: 100% 0 % Reduction in Volume: 100% 0 Epithelialization: Small (1-33%) 0 Tunneling: No 0 Undermining: No Wound Description Classification: Full Thickness Without Exposed Support Structures Wound Margin: Flat and Intact Exudate Amount: None Present Foul Odor After Cleansing: No Slough/Fibrino No Wound Bed Granulation Amount: None Present (0%) Exposed Structure Necrotic Amount: None Present (0%) Fascia Exposed: No Fat Layer (Subcutaneous Tissue) Exposed: Yes Tendon Exposed: No Muscle Exposed: No Joint Exposed: No Bone Exposed: No Assessment Notes scab covered Electronic Signature(s) Signed: 05/11/2020 5:48:51 PM By: Baruch Gouty RN, BSN Signed: 05/11/2020 6:10:24 PM By: Deon Pilling Previous Signature: 05/11/2020 8:51:56 AM Version By: Sandre Kitty Entered By: Baruch Gouty on 05/11/2020 09:04:29 -------------------------------------------------------------------------------- [place] Details Patient Name: Date of Service: [place], [name] LD O. 05/11/2020 8:30 A M Medical Record Number: [id number] Patient Account Number: [id number] Date of Birth/Sex: Treating RN: 09-Jul-1931 (87 y.o. [name], Meta.Reding Primary Care  Shaft Corigliano: [name] Other Clinician: Referring Irma Roulhac: Treating Jenice Leiner/Extender: Judeth Porch in Treatment: 1 Vital Signs Time Taken: 08:42 Temperature (F): 98.3 Height (in): 69 Pulse (bpm): 71 Weight (lbs): 213 Respiratory Rate (breaths/min): 18 Body Mass Index (BMI): 31.5 Blood Pressure (mmHg): 153/65 Reference Range: 80 - 120 mg / dl Electronic Signature(s) Signed: 05/11/2020 8:51:56 AM By: Sandre Kitty Entered By: Sandre Kitty on 05/11/2020 08:42:40

## 2020-05-12 NOTE — Chronic Care Management (AMB) (Signed)
Chronic Care Management Pharmacy Assistant   Name: [name]  MRN: [id number] DOB: 03/04/1931  Reason for Encounter: Hypertension Disease State Call   Recent office visits:  04/27/20- [name] (TE)- Referral to wound care   Recent consult visits:  No visits noted  Hospital visits:  None in previous 6 months  Medications: Outpatient Encounter Medications as of 05/12/2020  Medication Sig  . allopurinol (ZYLOPRIM) 300 MG tablet TAKE 1 TABLET (300 MG TOTAL) BY MOUTH DAILY.  . bisacodyl (DULCOLAX) 5 MG EC tablet Take 5 mg by mouth daily as needed for moderate constipation. (Patient not taking: Reported on 07/08/2019)  . Calcium Carbonate-Vit D-Min (CALCIUM 1200 PO) Take by mouth.  . ferrous sulfate 325 (65 FE) MG tablet Take 325 mg by mouth daily.   . furosemide (LASIX) 20 MG tablet TAKE 1 TO 2 TABLETS DAILY FOR SWELLING  . HYDROcodone-acetaminophen (NORCO/VICODIN) 5-325 MG tablet Take 1 tablet by mouth every 4 (four) hours as needed for moderate pain. (Patient not taking: Reported on 03/21/2019)  . lisinopril (ZESTRIL) 2.5 MG tablet Take 1 tablet (2.5 mg total) by mouth daily. This will be last refill without an appt.  It has been a year since your last visit  . metFORMIN (GLUCOPHAGE) 500 MG tablet TAKE 1 TABLET BY MOUTH EVERY DAY  . metoprolol succinate (TOPROL-XL) 50 MG 24 hr tablet TAKE 1 TABLET BY MOUTH EVERY DAY  . simvastatin (ZOCOR) 20 MG tablet TAKE 1 TABLET BY MOUTH EVERY DAY  . vitamin B-12 (CYANOCOBALAMIN) 500 MCG tablet Take 500 mcg by mouth daily.  Marland Kitchen warfarin (COUMADIN) 5 MG tablet TAKE AS DIRECTED BY ANTICOAGULATION CLINIC   No facility-administered encounter medications on file as of 05/12/2020.   Reviewed chart prior to disease state call. Spoke with patient regarding BP  Recent Office Vitals: BP Readings from Last 3 Encounters:  03/21/19 (!) 124/58  03/14/19 128/60  02/24/19 130/76   Pulse Readings from Last 3 Encounters:  03/21/19 74  03/14/19 65   02/24/19 78    Wt Readings from Last 3 Encounters:  07/08/19 215 lb (97.5 kg)  03/21/19 221 lb (100.2 kg)  03/14/19 220 lb (99.8 kg)     Kidney Function Lab Results  Component Value Date/Time   CREATININE 1.9 (A) 08/01/2019 12:00 AM   CREATININE 1.79 (H) 03/05/2019 01:55 PM   CREATININE 2.05 (H) 02/24/2019 02:32 PM   CREATININE 1.71 (H) 06/21/2018 02:36 PM   CREATININE 1.06 08/07/2016 10:29 AM   GFR 36.19 (L) 03/05/2019 01:55 PM   GFRNONAA 34 08/01/2019 12:00 AM   GFRAA 58 (L) 01/23/2016 05:15 AM    BMP Latest Ref Rng & Units 08/01/2019 03/05/2019 02/24/2019  Glucose 70 - 99 mg/dL - 92 125(H)  BUN 4 - 21 31(A) 28(H) 23  Creatinine 0.6 - 1.3 1.9(A) 1.79(H) 2.05(H)  BUN/Creat Ratio 6 - 22 (calc) - - -  Sodium 137 - 147 138 136 139  Potassium 3.4 - 5.3 4.6 4.5 4.4  Chloride 99 - 108 104 101 103  CO2 13 - 22 32(A) 27 29  Calcium 8.7 - 10.7 9.6 9.5 9.3   I spoke with [name] today and he was a bit busy looking for something so we didn't talk for long. He has been doing well and keeping up with taking his medications. He usually takes them later in the day after he gets up because he wakes up late. His wife has sleeping problems so he stays up with her and  watches TV. Overall [name] says he feels fine and has no concerns.   Current antihypertensive regimen:  Furosemide 20 mg tablet; 1-2 tablet daily Lisinopril 2.5 mg tablet daily Metoprolol Succinate 50 mg daily  How often are you checking your Blood Pressure? Patient stated he does not check his BP at home and does not own a monitor  Current home BP readings: None Given  What recent interventions/DTPs have been made by any provider to improve Blood Pressure control since last CPP Visit:  No recent interventions  Any recent hospitalizations or ED visits since last visit with CPP? No  What diet changes have been made to improve Blood Pressure Control?   Patient stated he just eats normally with no specific diet  What  exercise is being done to improve your Blood Pressure Control?  Patient did not provide any daily activities  Adherence Review: Is the patient currently on ACE/ARB medication? No Does the patient have >5 day gap between last estimated fill dates? No  Furosemide 20 mg- 90 DS last filled 03/16/20  Metoprolol Succinate 50 mg - 90 DS last filled 02/23/20  Star Rating Drugs: Lisinopril 2.5 mg- 30 DS last filled 03/17/20 Simvastatin 20 mg- 90 DS last filled 02/26/20  Wilford Sports CPA, CMA

## 2020-05-15 ENCOUNTER — Other Ambulatory Visit: Payer: Self-pay | Admitting: Family Medicine

## 2020-05-16 ENCOUNTER — Other Ambulatory Visit: Payer: Self-pay | Admitting: Family Medicine

## 2020-05-19 ENCOUNTER — Ambulatory Visit: Payer: Medicare HMO

## 2020-05-20 ENCOUNTER — Encounter: Payer: Medicare HMO | Admitting: Family Medicine

## 2020-05-24 ENCOUNTER — Other Ambulatory Visit: Payer: Self-pay | Admitting: Family Medicine

## 2020-05-25 ENCOUNTER — Encounter (HOSPITAL_BASED_OUTPATIENT_CLINIC_OR_DEPARTMENT_OTHER): Payer: Medicare HMO | Admitting: Internal Medicine

## 2020-05-25 ENCOUNTER — Other Ambulatory Visit: Payer: Self-pay

## 2020-05-25 DIAGNOSIS — E119 Type 2 diabetes mellitus without complications: Secondary | ICD-10-CM | POA: Diagnosis not present

## 2020-05-25 DIAGNOSIS — N182 Chronic kidney disease, stage 2 (mild): Secondary | ICD-10-CM | POA: Diagnosis not present

## 2020-05-25 DIAGNOSIS — I4891 Unspecified atrial fibrillation: Secondary | ICD-10-CM | POA: Diagnosis not present

## 2020-05-25 DIAGNOSIS — S61501A Unspecified open wound of right wrist, initial encounter: Secondary | ICD-10-CM | POA: Diagnosis not present

## 2020-05-25 DIAGNOSIS — S51801A Unspecified open wound of right forearm, initial encounter: Secondary | ICD-10-CM | POA: Diagnosis not present

## 2020-05-25 DIAGNOSIS — Z87891 Personal history of nicotine dependence: Secondary | ICD-10-CM | POA: Diagnosis not present

## 2020-05-25 DIAGNOSIS — S51802D Unspecified open wound of left forearm, subsequent encounter: Secondary | ICD-10-CM | POA: Diagnosis not present

## 2020-05-25 DIAGNOSIS — Z7901 Long term (current) use of anticoagulants: Secondary | ICD-10-CM | POA: Diagnosis not present

## 2020-05-25 DIAGNOSIS — I872 Venous insufficiency (chronic) (peripheral): Secondary | ICD-10-CM | POA: Diagnosis not present

## 2020-05-25 DIAGNOSIS — E1122 Type 2 diabetes mellitus with diabetic chronic kidney disease: Secondary | ICD-10-CM | POA: Diagnosis not present

## 2020-05-25 DIAGNOSIS — S51802A Unspecified open wound of left forearm, initial encounter: Secondary | ICD-10-CM | POA: Diagnosis not present

## 2020-05-25 DIAGNOSIS — I129 Hypertensive chronic kidney disease with stage 1 through stage 4 chronic kidney disease, or unspecified chronic kidney disease: Secondary | ICD-10-CM | POA: Diagnosis not present

## 2020-05-26 NOTE — Progress Notes (Signed)
EUCLIDE, GRANITO ([id number]) Visit Report for 05/25/2020 Chief Complaint Document Details Patient Name: Date of Service: [place], [place] LD O. 05/25/2020 8:30 A M Medical Record Number: [id number] Patient Account Number: [id number] Date of Birth/Sex: Treating RN: June 26, 1931 (87 y.o. Burnadette Pop, [name] Primary Care Provider: [name] Other Clinician: Referring Provider: Treating Provider/Extender: Judeth Porch in Treatment: 3 Information Obtained from: Patient Chief Complaint Wounds to the left forearm Electronic Signature(s) Signed: 05/25/2020 9:56:42 AM By: Kalman Shan DO Entered By: Kalman Shan on 05/25/2020 09:53:20 -------------------------------------------------------------------------------- HPI Details Patient Name: Date of Service: Tam[place] Julian, [name] LD O. 05/25/2020 8:30 A M Medical Record Number: [id number] Patient Account Number: [id number] Date of Birth/Sex: Treating RN: 08-12-1931 (87 y.o. Burnadette Pop, [name] Primary Care Provider: [name] Other Clinician: Referring Provider: Treating Provider/Extender: Judeth Porch in Treatment: 3 History of Present Illness HPI Description: Admission 5/3 Mr. Marchant is an 87 year old male with a past medical history of essential hypertension, type 2 diabetes on oral agents, atrial fibrillation on warfarin, and chronic venous insufficiency That presents today for 3 wounds. 2 are located to his left forearm and one is on the right wrist. He developed the most distal wound to his left forearm 1 month ago when he was reaching behind the headboard and developed a skin tear. This has been healing well for the past month. The other 2 wounds were developed after removing a Band-Aid from the area. These are also limited to skin tears. These developed about 1 week ago. He has been using a T pad and Band-Aid to the wounds. He overall feels well denies any signs of  infection. elfa 5/10; patient presents for 1 week follow-up. He has been using collagen to the 3 wounds. 2 of the wounds have closed. He has no complaints or issues today. He denies acute signs of infection. 5/24; patient presents for 2-week follow-up. He has been doing dressing changes with collagen. He has no complaints today or issues. He denies signs of infection. Electronic Signature(s) Signed: 05/25/2020 9:56:42 AM By: Kalman Shan DO Entered By: Kalman Shan on 05/25/2020 09:54:12 -------------------------------------------------------------------------------- Physical Exam Details Patient Name: Date of Service: [place], [name] LD O. 05/25/2020 8:30 A M Medical Record Number: [id number] Patient Account Number: [id number] Date of Birth/Sex: Treating RN: Dec 04, 1931 (87 y.o. Erie Noe Primary Care Provider: [name] Other Clinician: Referring Provider: Treating Provider/Extender: Judeth Porch in Treatment: 3 Constitutional respirations regular, non-labored and within target range for patient.Marland Kitchen Psychiatric pleasant and cooperative. Notes Left arm: There is excellent granulation tissue present to the open wound. No signs of infection Electronic Signature(s) Signed: 05/25/2020 9:56:42 AM By: Kalman Shan DO Entered By: Kalman Shan on 05/25/2020 09:54:48 -------------------------------------------------------------------------------- Physician Orders Details Patient Name: Date of Service: Tamala Julian, [name] LD O. 05/25/2020 8:30 A M Medical Record Number: [id number] Patient Account Number: [id number] Date of Birth/Sex: Treating RN: 11/12/1931 (87 y.o. Burnadette Pop, [name] Primary Care Provider: [name] Other Clinician: Referring Provider: Treating Provider/Extender: Judeth Porch in Treatment: 3 Verbal / Phone Orders: No Diagnosis Coding ICD-10 Coding Code Description [phone]  Unspecified open wound of left forearm, subsequent encounter I10 Essential (primary) hypertension I87.2 Venous insufficiency (chronic) (peripheral) E11.9 Type 2 diabetes mellitus without complications Y85.02 Unspecified atrial fibrillation Follow-up Appointments ppointment in 2 weeks. - Dr. Heber Ramsey Return A Bathing/ Shower/ Hygiene May shower and wash wound with soap and water. Additional Orders / Instructions Other: - Please apply stretch net to right  4 fourth finger for protection of nail. Wound Treatment Wound #1 - Forearm Wound Laterality: Left, Proximal Cleanser: Soap and Water 1 x Per QBH/41 Days Discharge Instructions: May shower and wash wound with dial antibacterial soap and water prior to dressing change. Prim Dressing: Promogran Prisma Matrix, 4.34 (sq in) (silver collagen) (DME) (Generic) 1 x Per Day/15 Days ary Discharge Instructions: Moisten collagen with saline or hydrogel Secondary Dressing: T Non-adherent Dressing, 2x3 in (DME) (Generic) 1 x Per Day/15 Days elfa Discharge Instructions: Apply over primary dressing as directed. Secured With: Child psychotherapist, Sterile 2x75 (in/in) (DME) (Generic) 1 x Per Day/15 Days Discharge Instructions: Secure with stretch gauze as directed. Secured With: 32M Medipore H Soft Cloth Surgical T 4 x 2 (in/yd) (DME) (Generic) 1 x Per Day/15 Days ape Discharge Instructions: Secure dressing with tape as directed. Electronic Signature(s) Signed: 05/25/2020 9:56:42 AM By: Kalman Shan DO Entered By: Kalman Shan on 05/25/2020 09:55:08 -------------------------------------------------------------------------------- Problem List Details Patient Name: Date of Service: Tamala Julian, [place] NA LD O. 05/25/2020 8:30 A M Medical Record Number: [id number] Patient Account Number: [id number] Date of Birth/Sex: Treating RN: 1931-12-15 (87 y.o. Burnadette Pop, [name] Primary Care Provider: [name] Other Clinician: Referring  Provider: Treating Provider/Extender: Judeth Porch in Treatment: 3 Active Problems ICD-10 Encounter Code Description Active Date MDM Diagnosis S51.802D Unspecified open wound of left forearm, subsequent encounter 05/11/2020 No Yes I10 Essential (primary) hypertension 05/04/2020 No Yes I87.2 Venous insufficiency (chronic) (peripheral) 05/04/2020 No Yes E11.9 Type 2 diabetes mellitus without complications 07/05/5327 No Yes I48.91 Unspecified atrial fibrillation 05/04/2020 No Yes Inactive Problems ICD-10 Code Description Active Date Inactive Date S51.802A Unspecified open wound of left forearm, initial encounter 05/04/2020 05/04/2020 S51.801A Unspecified open wound of right forearm, initial encounter 05/04/2020 05/04/2020 Resolved Problems ICD-10 Code Description Active Date Resolved Date S51.801D Unspecified open wound of right forearm, subsequent encounter 05/11/2020 05/11/2020 Electronic Signature(s) Signed: 05/25/2020 9:56:42 AM By: Kalman Shan DO Signed: 05/25/2020 9:56:42 AM By: Kalman Shan DO Entered By: Kalman Shan on 05/25/2020 09:53:00 -------------------------------------------------------------------------------- Progress Note Details Patient Name: Date of Service: Tamala Julian, [name] LD O. 05/25/2020 8:30 A M Medical Record Number: [id number] Patient Account Number: [id number] Date of Birth/Sex: Treating RN: 09/17/1931 (87 y.o. Burnadette Pop, [name] Primary Care Provider: [name] Other Clinician: Referring Provider: Treating Provider/Extender: Judeth Porch in Treatment: 3 Subjective Chief Complaint Information obtained from Patient Wounds to the left forearm History of Present Illness (HPI) Admission 5/3 Mr. Linville is an 87 year old male with a past medical history of essential hypertension, type 2 diabetes on oral agents, atrial fibrillation on warfarin, and chronic venous insufficiency That presents today for 3  wounds. 2 are located to his left forearm and one is on the right wrist. He developed the most distal wound to his left forearm 1 month ago when he was reaching behind the headboard and developed a skin tear. This has been healing well for the past month. The other 2 wounds were developed after removing a Band-Aid from the area. These are also limited to skin tears. These developed about 1 week ago. He has been using a T pad and Band-Aid to the wounds. He overall feels well denies any signs of infection. elfa 5/10; patient presents for 1 week follow-up. He has been using collagen to the 3 wounds. 2 of the wounds have closed. He has no complaints or issues today. He denies acute signs of infection. 5/24; patient presents for 2-week follow-up. He has been doing  dressing changes with collagen. He has no complaints today or issues. He denies signs of infection. Patient History Information obtained from Patient. Family History Diabetes - Father, Heart Disease - Father, Hypertension - Father, Stroke - Father, No family history of Cancer, Hereditary Spherocytosis, Kidney Disease, Lung Disease, Seizures, Thyroid Problems, Tuberculosis. Social History Former smoker - quit at 87 years old, Marital Status - Married, Alcohol Use - Never, Drug Use - No History, Caffeine Use - Rarely. Medical History Eyes Denies history of Cataracts, Glaucoma, Optic Neuritis Ear/Nose/Mouth/Throat Denies history of Chronic sinus problems/congestion, Middle ear problems Hematologic/Lymphatic Patient has history of Anemia Denies history of Hemophilia, Human Immunodeficiency Virus, Lymphedema, Sickle Cell Disease Respiratory Denies history of Aspiration, Asthma, Chronic Obstructive Pulmonary Disease (COPD), Pneumothorax, Sleep Apnea, Tuberculosis Cardiovascular Patient has history of Arrhythmia - A.fib, Hypertension, Peripheral Venous Disease Gastrointestinal Denies history of Cirrhosis , Colitis, Crohnoos, Hepatitis A,  Hepatitis B, Hepatitis C Endocrine Patient has history of Type II Diabetes Immunological Denies history of Lupus Erythematosus, Raynaudoos, Scleroderma Integumentary (Skin) Denies history of History of Burn Musculoskeletal Patient has history of Gout Denies history of Rheumatoid Arthritis, Osteoarthritis, Osteomyelitis Neurologic Denies history of Dementia, Neuropathy, Quadriplegia, Paraplegia, Seizure Disorder Oncologic Denies history of Received Chemotherapy, Received Radiation Psychiatric Patient has history of Confinement Anxiety Denies history of Anorexia/bulimia Hospitalization/Surgery History - 2017 GI bleed. Medical A Surgical History Notes nd Cardiovascular hyperlipidemia Gastrointestinal chronic constipation Genitourinary Stage II kidney Disease Musculoskeletal osteopenia Objective Constitutional respirations regular, non-labored and within target range for patient.. Vitals Time Taken: 8:48 AM, Height: 69 in, Weight: 213 lbs, BMI: 31.5, Temperature: 97.9 F, Pulse: 85 bpm, Respiratory Rate: 18 breaths/min, Blood Pressure: 139/73 mmHg. Psychiatric pleasant and cooperative. General Notes: Left arm: There is excellent granulation tissue present to the open wound. No signs of infection Integumentary (Hair, Skin) Wound #1 status is Open. Original cause of wound was Trauma. The date acquired was: 04/02/2020. The wound has been in treatment 3 weeks. The wound is located on the Left,Proximal Forearm. The wound measures 0.8cm length x 0.9cm width x 0.1cm depth; 0.565cm^2 area and 0.057cm^3 volume. There is Fat Layer (Subcutaneous Tissue) exposed. There is no tunneling or undermining noted. There is a medium amount of serosanguineous drainage noted. The wound margin is distinct with the outline attached to the wound base. There is large (67-100%) red, friable, hyper - granulation within the wound bed. There is a small (1- 33%) amount of necrotic tissue within the wound bed  including Eschar. Assessment Active Problems ICD-10 Unspecified open wound of left forearm, subsequent encounter Essential (primary) hypertension Venous insufficiency (chronic) (peripheral) Type 2 diabetes mellitus without complications Unspecified atrial fibrillation Patient still has a wound to his left forearm however tissue looks healthy and healing. I recommended 2 more weeks of collagen and follow-up. Plan Follow-up Appointments: Return Appointment in 2 weeks. - Dr. Heber [place] Bathing/ Shower/ Hygiene: May shower and wash wound with soap and water. Additional Orders / Instructions: Other: - Please apply stretch net to right 4 fourth finger for protection of nail. WOUND #1: - Forearm Wound Laterality: Left, Proximal Cleanser: Soap and Water 1 x Per Day/15 Days Discharge Instructions: May shower and wash wound with dial antibacterial soap and water prior to dressing change. Prim Dressing: Promogran Prisma Matrix, 4.34 (sq in) (silver collagen) (DME) (Generic) 1 x Per Day/15 Days ary Discharge Instructions: Moisten collagen with saline or hydrogel Secondary Dressing: T Non-adherent Dressing, 2x3 in (DME) (Generic) 1 x Per Day/15 Days elfa Discharge Instructions: Apply over primary dressing  as directed. Secured With: Child psychotherapist, Sterile 2x75 (in/in) (DME) (Generic) 1 x Per Day/15 Days Discharge Instructions: Secure with stretch gauze as directed. Secured With: 17M Medipore H Soft Cloth Surgical T 4 x 2 (in/yd) (DME) (Generic) 1 x Per Day/15 Days ape Discharge Instructions: Secure dressing with tape as directed. 1. Collagen daily 2. Follow-up in 2 weeks Electronic Signature(s) Signed: 05/25/2020 9:56:42 AM By: Kalman Shan DO Entered By: Kalman Shan on 05/25/2020 09:55:53 -------------------------------------------------------------------------------- HxROS Details Patient Name: Date of Service: Tamala Julian, [name] LD O. 05/25/2020 8:30 A M Medical Record  Number: [id number] Patient Account Number: [id number] Date of Birth/Sex: Treating RN: 28-Jul-1931 (87 y.o. Erie Noe Primary Care Provider: [name] Other Clinician: Referring Provider: Treating Provider/Extender: Judeth Porch in Treatment: 3 Information Obtained From Patient Eyes Medical History: Negative for: Cataracts; Glaucoma; Optic Neuritis Ear/Nose/Mouth/Throat Medical History: Negative for: Chronic sinus problems/congestion; Middle ear problems Hematologic/Lymphatic Medical History: Positive for: Anemia Negative for: Hemophilia; Human Immunodeficiency Virus; Lymphedema; Sickle Cell Disease Respiratory Medical History: Negative for: Aspiration; Asthma; Chronic Obstructive Pulmonary Disease (COPD); Pneumothorax; Sleep Apnea; Tuberculosis Cardiovascular Medical History: Positive for: Arrhythmia - A.fib; Hypertension; Peripheral Venous Disease Past Medical History Notes: hyperlipidemia Gastrointestinal Medical History: Negative for: Cirrhosis ; Colitis; Crohns; Hepatitis A; Hepatitis B; Hepatitis C Past Medical History Notes: chronic constipation Endocrine Medical History: Positive for: Type II Diabetes Time with diabetes: 15 years ago Treated with: Diet Blood sugar tested every day: No Genitourinary Medical History: Past Medical History Notes: Stage II kidney Disease Immunological Medical History: Negative for: Lupus Erythematosus; Raynauds; Scleroderma Integumentary (Skin) Medical History: Negative for: History of Burn Musculoskeletal Medical History: Positive for: Gout Negative for: Rheumatoid Arthritis; Osteoarthritis; Osteomyelitis Past Medical History Notes: osteopenia Neurologic Medical History: Negative for: Dementia; Neuropathy; Quadriplegia; Paraplegia; Seizure Disorder Oncologic Medical History: Negative for: Received Chemotherapy; Received Radiation Psychiatric Medical History: Positive for:  Confinement Anxiety Negative for: Anorexia/bulimia Immunizations Pneumococcal Vaccine: Received Pneumococcal Vaccination: No Implantable Devices No devices added Hospitalization / Surgery History Type of Hospitalization/Surgery 2017 GI bleed Family and Social History Cancer: No; Diabetes: Yes - Father; Heart Disease: Yes - Father; Hereditary Spherocytosis: No; Hypertension: Yes - Father; Kidney Disease: No; Lung Disease: No; Seizures: No; Stroke: Yes - Father; Thyroid Problems: No; Tuberculosis: No; Former smoker - quit at 87 years old; Marital Status - Married; Alcohol Use: Never; Drug Use: No History; Caffeine Use: Rarely; Financial Concerns: No; Food, Clothing or Shelter Needs: No; Support System Lacking: No; Transportation Concerns: No Electronic Signature(s) Signed: 05/25/2020 9:56:42 AM By: Kalman Shan DO Signed: 05/26/2020 4:26:30 PM By: Rhae Hammock RN Entered By: Kalman Shan on 05/25/2020 09:54:19 -------------------------------------------------------------------------------- SuperBill Details Patient Name: Date of Service: [place], [place] NA LD O. 05/25/2020 Medical Record Number: [id number] Patient Account Number: [id number] Date of Birth/Sex: Treating RN: 1931/07/31 (87 y.o. Burnadette Pop, [name] Primary Care Provider: [name] Other Clinician: Referring Provider: Treating Provider/Extender: Judeth Porch in Treatment: 3 Diagnosis Coding ICD-10 Codes Code Description [id number] Unspecified open wound of left forearm, subsequent encounter S51.801D Unspecified open wound of right forearm, subsequent encounter I10 Essential (primary) hypertension I87.2 Venous insufficiency (chronic) (peripheral) E11.9 Type 2 diabetes mellitus without complications A12.87 Unspecified atrial fibrillation Facility Procedures CPT4 Code: [id number] Description: 99213 - WOUND CARE VISIT-LEV 3 EST PT Modifier: Quantity: 1 Physician Procedures :  CPT4 Code Description Modifier [id number] 99213 - WC PHYS LEVEL 3 - EST PT ICD-10 Diagnosis Description S51.802D Unspecified open wound of left forearm, subsequent encounter Quantity: 1 Electronic  Signature(s) Signed: 05/25/2020 9:56:42 AM By: Kalman Shan DO Entered By: Kalman Shan on 05/25/2020 09:56:12

## 2020-05-26 NOTE — Progress Notes (Signed)
OTTIE, TILLERY ([id number]) Visit Report for 05/25/2020 Arrival Information Details Patient Name: Date of Service: [place], [place] LD O. 05/25/2020 8:30 A M Medical Record Number: [id number] Patient Account Number: [id number] Date of Birth/Sex: Treating RN: May 28, 1931 (87 y.o. Burnadette Pop, [name] Primary Care Isabelly Kobler: [name] Other Clinician: Referring Suleika Donavan: Treating Filippa Yarbough/Extender: Judeth Porch in Treatment: 3 Visit Information History Since Last Visit Added or deleted any medications: No Patient Arrived: Kasandra Knudsen Any new allergies or adverse reactions: No Arrival Time: 08:48 Had a fall or experienced change in No Accompanied By: self activities of daily living that may affect Transfer Assistance: None risk of falls: Patient Identification Verified: Yes Signs or symptoms of abuse/neglect since last visito No Secondary Verification Process Completed: Yes Hospitalized since last visit: No Patient Requires Transmission-Based Precautions: No Implantable device outside of the clinic excluding No Patient Has Alerts: Yes cellular tissue based products placed in the center Patient Alerts: Patient on Blood Thinner since last visit: Has Dressing in Place as Prescribed: Yes Pain Present Now: No Electronic Signature(s) Signed: 05/25/2020 4:53:55 PM By: Sandre Kitty Entered By: Sandre Kitty on 05/25/2020 08:48:41 -------------------------------------------------------------------------------- Clinic Level of Care Assessment Details Patient Name: Date of Service: [place], [place] LD O. 05/25/2020 8:30 A M Medical Record Number: [id number] Patient Account Number: [id number] Date of Birth/Sex: Treating RN: [birth year] (87 y.o. Burnadette Pop, [name] Primary Care Brandilee Pies: [name] Other Clinician: Referring Maryella Abood: Treating Delanie Tirrell/Extender: Judeth Porch in Treatment: 3 Clinic Level of Care Assessment  Items TOOL 4 Quantity Score X- 1 0 Use when only an EandM is performed on FOLLOW-UP visit ASSESSMENTS - Nursing Assessment / Reassessment X- 1 10 Reassessment of Co-morbidities (includes updates in patient status) X- 1 5 Reassessment of Adherence to Treatment Plan ASSESSMENTS - Wound and Skin A ssessment / Reassessment X - Simple Wound Assessment / Reassessment - one wound 1 5 [] - 0 Complex Wound Assessment / Reassessment - multiple wounds X- 1 10 Dermatologic / Skin Assessment (not related to wound area) ASSESSMENTS - Focused Assessment [] - 0 Circumferential Edema Measurements - multi extremities X- 1 10 Nutritional Assessment / Counseling / Intervention [] - 0 Lower Extremity Assessment (monofilament, tuning fork, pulses) [] - 0 Peripheral Arterial Disease Assessment (using hand held doppler) ASSESSMENTS - Ostomy and/or Continence Assessment and Care [] - 0 Incontinence Assessment and Management [] - 0 Ostomy Care Assessment and Management (repouching, etc.) PROCESS - Coordination of Care X - Simple Patient / Family Education for ongoing care 1 15 [] - 0 Complex (extensive) Patient / Family Education for ongoing care X- 1 10 Staff obtains Programmer, systems, Records, T Results / Process Orders est [] - 0 Staff telephones HHA, Nursing Homes / Clarify orders / etc [] - 0 Routine Transfer to another Facility (non-emergent condition) [] - 0 Routine Hospital Admission (non-emergent condition) [] - 0 New Admissions / Biomedical engineer / Ordering NPWT Apligraf, etc. , [] - 0 Emergency Hospital Admission (emergent condition) X- 1 10 Simple Discharge Coordination [] - 0 Complex (extensive) Discharge Coordination PROCESS - Special Needs [] - 0 Pediatric / Minor Patient Management [] - 0 Isolation Patient Management [] - 0 Hearing / Language / Visual special needs [] - 0 Assessment of Community assistance (transportation, D/C planning, etc.) [] - 0 Additional  assistance / Altered mentation [] - 0 Support Surface(s) Assessment (bed, cushion, seat, etc.) INTERVENTIONS - Wound Cleansing / Measurement X - Simple Wound Cleansing - one wound 1 5 [] -  0 Complex Wound Cleansing - multiple wounds X- 1 5 Wound Imaging (photographs - any number of wounds) [] - 0 Wound Tracing (instead of photographs) X- 1 5 Simple Wound Measurement - one wound [] - 0 Complex Wound Measurement - multiple wounds INTERVENTIONS - Wound Dressings X - Small Wound Dressing one or multiple wounds 1 10 [] - 0 Medium Wound Dressing one or multiple wounds [] - 0 Large Wound Dressing one or multiple wounds X- 1 5 Application of Medications - topical [] - 0 Application of Medications - injection INTERVENTIONS - Miscellaneous [] - 0 External ear exam [] - 0 Specimen Collection (cultures, biopsies, blood, body fluids, etc.) [] - 0 Specimen(s) / Culture(s) sent or taken to Lab for analysis [] - 0 Patient Transfer (multiple staff / Civil Service fast streamer / Similar devices) [] - 0 Simple Staple / Suture removal (25 or less) [] - 0 Complex Staple / Suture removal (26 or more) [] - 0 Hypo / Hyperglycemic Management (close monitor of Blood Glucose) [] - 0 Ankle / Brachial Index (ABI) - do not check if billed separately X- 1 5 Vital Signs Has the patient been seen at the hospital within the last three years: Yes Total Score: 110 Level Of Care: New/Established - Level 3 Electronic Signature(s) Signed: 05/26/2020 4:26:30 PM By: Rhae Hammock RN Entered By: Rhae Hammock on 05/25/2020 09:40:19 -------------------------------------------------------------------------------- Encounter Discharge Information Details Patient Name: Date of Service: [place], [name] LD O. 05/25/2020 8:30 A M Medical Record Number: [id number] Patient Account Number: [id number] Date of Birth/Sex: Treating RN: 1931-10-06 (87 y.o. Marcheta Grammes Primary Care Shawnte Winton: [name] Other  Clinician: Referring Domonik Levario: Treating Anvita Hirata/Extender: Judeth Porch in Treatment: 3 Encounter Discharge Information Items Discharge Condition: Stable Ambulatory Status: [place] Discharge Destination: Home Transportation: Private Auto Schedule Follow-up Appointment: Yes Clinical Summary of Care: Provided on 05/25/2020 Form Type Recipient Paper Patient Patient Electronic Signature(s) Signed: 05/25/2020 11:04:34 AM By: Lorrin Jackson Entered By: Lorrin Jackson on 05/25/2020 11:04:34 -------------------------------------------------------------------------------- Lower Extremity Assessment Details Patient Name: Date of Service: [place], [place] LD O. 05/25/2020 8:30 A M Medical Record Number: [id number] Patient Account Number: [id number] Date of Birth/Sex: Treating RN: 1931/01/21 (87 y.o. Marcheta Grammes Primary Care Kharson Rasmusson: [name] Other Clinician: Referring Delford Wingert: Treating Camielle Sizer/Extender: Judeth Porch in Treatment: 3 Electronic Signature(s) Signed: 05/25/2020 9:01:17 AM By: Lorrin Jackson Entered By: Lorrin Jackson on 05/25/2020 09:01:17 -------------------------------------------------------------------------------- Multi Wound Chart Details Patient Name: Date of Service: [place], [place] LD O. 05/25/2020 8:30 A M Medical Record Number: [id number] Patient Account Number: [id number] Date of Birth/Sex: Treating RN: [birth year] (87 y.o. Burnadette Pop, [name] Primary Care Amante Fomby: [name] Other Clinician: Referring Gibran Veselka: Treating Lindi Abram/Extender: Judeth Porch in Treatment: 3 Vital Signs Height(in): 69 Pulse(bpm): 85 Weight(lbs): 213 Blood Pressure(mmHg): 139/73 Body Mass Index(BMI): 31 Temperature(F): 97.9 Respiratory Rate(breaths/min): 18 Photos: [1:No Photos Left, Proximal Forearm] [N/A:N/A N/A] Wound Location: [1:Trauma] [N/A:N/A] Wounding Event: [1:Skin T  ear] [N/A:N/A] Primary Etiology: [1:Anemia, Arrhythmia, Hypertension, N/A] Comorbid History: [1:Peripheral Venous Disease, Type II Diabetes, Gout, Confinement Anxiety 04/02/2020] [N/A:N/A] Date Acquired: [1:3] [N/A:N/A] Weeks of Treatment: [1:Open] [N/A:N/A] Wound Status: [1:0.8x0.9x0.1] [N/A:N/A] Measurements L x W x D (cm) [1:0.565] [N/A:N/A] A (cm) : rea [1:0.057] [N/A:N/A] Volume (cm) : [1:92.40%] [N/A:N/A] % Reduction in Area: [1:92.40%] [N/A:N/A] % Reduction in Volume: [1:Full Thickness Without Exposed] [N/A:N/A] Classification: [1:Support Structures Medium] [N/A:N/A] Exudate A mount: [1:Serosanguineous] [N/A:N/A] Exudate Type: [1:red, brown] [N/A:N/A] Exudate Color: [1:Distinct, outline attached] [N/A:N/A]  Wound Margin: [1:Large (67-100%)] [N/A:N/A] Granulation Amount: [1:Red, Hyper-granulation, Friable] [N/A:N/A] Granulation Quality: [1:Small (1-33%)] [N/A:N/A] Necrotic Amount: [1:Eschar] [N/A:N/A] Necrotic Tissue: [1:Fat Layer (Subcutaneous Tissue): Yes N/A] Exposed Structures: [1:Fascia: No Tendon: No Muscle: No Joint: No Bone: No Medium (34-66%)] [N/A:N/A] Treatment Notes Electronic Signature(s) Signed: 05/25/2020 9:56:42 AM By: Kalman Shan DO Signed: 05/26/2020 4:26:30 PM By: Rhae Hammock RN Entered By: Kalman Shan on 05/25/2020 09:53:05 -------------------------------------------------------------------------------- Multi-Disciplinary Care Plan Details Patient Name: Date of Service: [place], [place] NA LD O. 05/25/2020 8:30 A M Medical Record Number: [id number] Patient Account Number: [id number] Date of Birth/Sex: Treating RN: 1931/11/09 (87 y.o. Burnadette Pop, [name] Primary Care : [name] Other Clinician: Referring : Treating /Extender: Judeth Porch in Treatment: 3 Active Inactive Abuse / Safety / Falls / Self Care Management Nursing Diagnoses: History of Falls Potential for  falls Goals: Patient/caregiver will verbalize understanding of skin care regimen Date Initiated: 05/04/2020 Target Resolution Date: 06/04/2020 Goal Status: Active Interventions: Assess self care needs on admission and as needed Provide education on fall prevention Notes: Pain, Acute or Chronic Nursing Diagnoses: Potential alteration in comfort, pain Goals: Patient will verbalize adequate pain control and receive pain control interventions during procedures as needed Date Initiated: 05/04/2020 Target Resolution Date: 06/04/2020 Goal Status: Active Patient/caregiver will verbalize comfort level met Date Initiated: 05/04/2020 Target Resolution Date: 06/25/2020 Goal Status: Active Interventions: Provide education on pain management Reposition patient for comfort Notes: Wound/Skin Impairment Nursing Diagnoses: Knowledge deficit related to ulceration/compromised skin integrity Goals: Patient/caregiver will verbalize understanding of skin care regimen Date Initiated: 05/04/2020 Target Resolution Date: 07/02/2020 Goal Status: Active Interventions: Assess patient/caregiver ability to obtain necessary supplies Assess patient/caregiver ability to perform ulcer/skin care regimen upon admission and as needed Provide education on ulcer and skin care Treatment Activities: Skin care regimen initiated : 05/04/2020 Topical wound management initiated : 05/04/2020 Notes: Electronic Signature(s) Signed: 05/26/2020 4:26:30 PM By: Rhae Hammock RN Entered By: Rhae Hammock on 05/25/2020 09:36:41 -------------------------------------------------------------------------------- Pain Assessment Details Patient Name: Date of Service: [place], [name] LD O. 05/25/2020 8:30 A M Medical Record Number: [id number] Patient Account Number: [id number] Date of Birth/Sex: Treating RN: Jul 10, 1931 (87 y.o. Burnadette Pop, [name] Primary Care : [name] Other Clinician: Referring : Treating  /Extender: Judeth Porch in Treatment: 3 Active Problems Location of Pain Severity and Description of Pain Patient Has Paino No Site Locations Pain Management and Medication Current Pain Management: Electronic Signature(s) Signed: 05/25/2020 4:53:55 PM By: Sandre Kitty Signed: 05/26/2020 4:26:30 PM By: Rhae Hammock RN Entered By: Sandre Kitty on 05/25/2020 08:49:04 -------------------------------------------------------------------------------- Patient/Caregiver Education Details Patient Name: Date of Service: [place] NA LD Jenetta Downer 5/24/2022andnbsp8:30 A M Medical Record Number: [id number] Patient Account Number: [id number] Date of Birth/Gender: Treating RN: 05/01/1931 (87 y.o. Erie Noe Primary Care Physician: [name] Other Clinician: Referring Physician: Treating Physician/Extender: Judeth Porch in Treatment: 3 Education Assessment Education Provided To: Patient Education Topics Provided Pain: Methods: Explain/Verbal Responses: State content correctly Electronic Signature(s) Signed: 05/26/2020 4:26:30 PM By: Rhae Hammock RN Entered By: Rhae Hammock on 05/25/2020 09:36:58 -------------------------------------------------------------------------------- Wound Assessment Details Patient Name: Date of Service: [place], [name] LD O. 05/25/2020 8:30 A M Medical Record Number: [id number] Patient Account Number: [id number] Date of Birth/Sex: Treating RN: October 11, 1931 (87 y.o. E[place] Noe Primary Care : [name] Other Clinician: Referring : Treating /Extender: Genoveva Ill Weeks in Treatment: 3 Wound Status Wound Number: 1 Primary Skin Tear Etiology: Wound Location: Left, Proximal Forearm Wound Open  Wounding Event: Trauma Status: Date Acquired: 04/02/2020 Comorbid Anemia, Arrhythmia, Hypertension, Peripheral  Venous Disease, Weeks Of Treatment: 3 History: Type II Diabetes, Gout, Confinement Anxiety Clustered Wound: No Photos Wound Measurements Length: (cm) 0.8 Width: (cm) 0.9 Depth: (cm) 0.1 Area: (cm) 0.565 Volume: (cm) 0.057 % Reduction in Area: 92.4% % Reduction in Volume: 92.4% Epithelialization: Medium (34-66%) Tunneling: No Undermining: No Wound Description Classification: Full Thickness Without Exposed Support Structures Wound Margin: Distinct, outline attached Exudate Amount: Medium Exudate Type: Serosanguineous Exudate Color: red, brown Foul Odor After Cleansing: No Slough/Fibrino No Wound Bed Granulation Amount: Large (67-100%) Exposed Structure Granulation Quality: Red, Hyper-granulation, Friable Fascia Exposed: No Necrotic Amount: Small (1-33%) Fat Layer (Subcutaneous Tissue) Exposed: Yes Necrotic Quality: Eschar Tendon Exposed: No Muscle Exposed: No Joint Exposed: No Bone Exposed: No Treatment Notes Wound #1 (Forearm) Wound Laterality: Left, Proximal Cleanser Soap and Water Discharge Instruction: May shower and wash wound with dial antibacterial soap and water prior to dressing change. Peri-Wound Care Topical Primary Dressing Promogran Prisma Matrix, 4.34 (sq in) (silver collagen) Discharge Instruction: Moisten collagen with saline or hydrogel Secondary Dressing T Non-adherent Dressing, 2x3 in elfa Discharge Instruction: Apply over primary dressing as directed. Secured With Conforming Stretch Gauze Bandage, Sterile 2x75 (in/in) Discharge Instruction: Secure with stretch gauze as directed. 87M Medipore H Soft Cloth Surgical T 4 x 2 (in/yd) ape Discharge Instruction: Secure dressing with tape as directed. Compression Wrap Compression Stockings Add-Ons Electronic Signature(s) Signed: 05/25/2020 4:53:55 PM By: Sandre Kitty Signed: 05/26/2020 4:26:30 PM By: Rhae Hammock RN Previous Signature: 05/25/2020 9:01:48 AM Version By: Lorrin Jackson Entered By: Sandre Kitty on 05/25/2020 16:41:36 -------------------------------------------------------------------------------- Vitals Details Patient Name: Date of Service: [place], [place] NA LD O. 05/25/2020 8:30 A M Medical Record Number: [id number] Patient Account Number: [id number] Date of Birth/Sex: Treating RN: 1931-06-18 (87 y.o. Burnadette Pop, [name] Primary Care Alizandra Loh: [name] Other Clinician: Referring Trase Bunda: Treating Lakeyia Surber/Extender: Judeth Porch in Treatment: 3 Vital Signs Time Taken: 08:48 Temperature (F): 97.9 Height (in): 69 Pulse (bpm): 85 Weight (lbs): 213 Respiratory Rate (breaths/min): 18 Body Mass Index (BMI): 31.5 Blood Pressure (mmHg): 139/73 Reference Range: 80 - 120 mg / dl Electronic Signature(s) Signed: 05/25/2020 4:53:55 PM By: Sandre Kitty Entered By: Sandre Kitty on 05/25/2020 08:48:58

## 2020-05-27 ENCOUNTER — Other Ambulatory Visit: Payer: Self-pay | Admitting: Family Medicine

## 2020-06-08 ENCOUNTER — Other Ambulatory Visit: Payer: Self-pay

## 2020-06-08 ENCOUNTER — Encounter (HOSPITAL_BASED_OUTPATIENT_CLINIC_OR_DEPARTMENT_OTHER): Payer: Medicare HMO | Attending: Internal Medicine | Admitting: Internal Medicine

## 2020-06-08 DIAGNOSIS — L928 Other granulomatous disorders of the skin and subcutaneous tissue: Secondary | ICD-10-CM | POA: Diagnosis not present

## 2020-06-08 DIAGNOSIS — X58XXXA Exposure to other specified factors, initial encounter: Secondary | ICD-10-CM | POA: Diagnosis not present

## 2020-06-08 DIAGNOSIS — Z833 Family history of diabetes mellitus: Secondary | ICD-10-CM | POA: Insufficient documentation

## 2020-06-08 DIAGNOSIS — I872 Venous insufficiency (chronic) (peripheral): Secondary | ICD-10-CM | POA: Diagnosis not present

## 2020-06-08 DIAGNOSIS — I129 Hypertensive chronic kidney disease with stage 1 through stage 4 chronic kidney disease, or unspecified chronic kidney disease: Secondary | ICD-10-CM | POA: Insufficient documentation

## 2020-06-08 DIAGNOSIS — E1151 Type 2 diabetes mellitus with diabetic peripheral angiopathy without gangrene: Secondary | ICD-10-CM | POA: Insufficient documentation

## 2020-06-08 DIAGNOSIS — E11622 Type 2 diabetes mellitus with other skin ulcer: Secondary | ICD-10-CM | POA: Diagnosis not present

## 2020-06-08 DIAGNOSIS — E1122 Type 2 diabetes mellitus with diabetic chronic kidney disease: Secondary | ICD-10-CM | POA: Diagnosis not present

## 2020-06-08 DIAGNOSIS — S51802D Unspecified open wound of left forearm, subsequent encounter: Secondary | ICD-10-CM | POA: Insufficient documentation

## 2020-06-08 DIAGNOSIS — Z7901 Long term (current) use of anticoagulants: Secondary | ICD-10-CM | POA: Diagnosis not present

## 2020-06-08 DIAGNOSIS — N182 Chronic kidney disease, stage 2 (mild): Secondary | ICD-10-CM | POA: Diagnosis not present

## 2020-06-08 DIAGNOSIS — Z87891 Personal history of nicotine dependence: Secondary | ICD-10-CM | POA: Diagnosis not present

## 2020-06-08 NOTE — Progress Notes (Addendum)
CORDARRELL, SANE ([id number]) Visit Report for 06/08/2020 HPI Details Patient Name: Date of Service: [place], [place] LD O. 06/08/2020 8:30 A M Medical Record Number: [id number] Patient Account Number: [id number] Date of Birth/Sex: Treating RN: December 26, 1931 (87 y.o. [name], [name] Primary Care Provider: [name] Other Clinician: Referring Provider: Treating Provider/Extender: Alinda Sierras in Treatment: 5 History of Present Illness HPI Description: Admission 5/3 Mr. [name] is an 87 year old male with a past medical history of essential hypertension, type 2 diabetes on oral agents, atrial fibrillation on warfarin, and chronic venous insufficiency That presents today for 3 wounds. 2 are located to his left forearm and one is on the right wrist. He developed the most distal wound to his left forearm 1 month ago when he was reaching behind the headboard and developed a skin tear. This has been healing well for the past month. The other 2 wounds were developed after removing a Band-Aid from the area. These are also limited to skin tears. These developed about 1 week ago. He has been using a T pad and Band-Aid to the wounds. He overall feels well denies any signs of infection. elfa 5/10; patient presents for 1 week follow-up. He has been using collagen to the 3 wounds. 2 of the wounds have closed. He has no complaints or issues today. He denies acute signs of infection. 5/24; patient presents for 2-week follow-up. He has been doing dressing changes with collagen. He has no complaints today or issues. He denies signs of infection. 6/7; the patient arrives in clinic with nothing open on his right arm. He still has the open area on the left arm but has a new area on the left posterior humerus just above the olecranon. Again he relates this to I think making the bed and trauma on the headboard. He has been using silver collagen to his wounds Electronic  Signature(s) Signed: 06/08/2020 5:45:41 PM By: Linton Ham MD Entered By: Linton Ham on 06/08/2020 09:57:39 -------------------------------------------------------------------------------- Chemical Cauterization Details Patient Name: Date of Service: [name], [place] LD O. 06/08/2020 8:30 A M Medical Record Number: [id number] Patient Account Number: [id number] Date of Birth/Sex: Treating RN: 01/15/1931 (74 y.o. [name], [name] Primary Care Provider: [name] Other Clinician: Referring Provider: Treating Provider/Extender: Alinda Sierras in Treatment: 5 Procedure Performed for: Wound #1 Left,Proximal Forearm Performed By: Physician Ricard Dillon., MD Post Procedure Diagnosis Same as Pre-procedure Notes silver nitrate applied to hypergranulation Electronic Signature(s) Signed: 06/08/2020 5:45:41 PM By: Linton Ham MD Entered By: Linton Ham on 06/08/2020 10:01:32 -------------------------------------------------------------------------------- Physical Exam Details Patient Name: Date of Service: [name], [name] LD O. 06/08/2020 8:30 A M Medical Record Number: [id number] Patient Account Number: [id number] Date of Birth/Sex: Treating RN: 12-27-1931 (71 y.o. [name] Primary Care Provider: [name] Other Clinician: Referring Provider: Treating Provider/Extender: Alinda Sierras in Treatment: 5 Constitutional Sitting or standing Blood Pressure is within target range for patient.. Pulse regular and within target range for patient.Marland Kitchen Respirations regular, non-labored and within target range.. Temperature is normal and within the target range for the patient.Marland Kitchen Appears in no distress. Notes Wound exam; left arm. He has a hyper granulated area on the left dorsal arm which I think was part of his original wound presentation. He has a new superficial area on the posterior left humerus just above the  olecranon. This looks like a skin tear. Very fragile skin likely chronic solar damage. Nothing here looks malignant Electronic Signature(s) Signed: 06/08/2020  5:45:41 PM By: Linton Ham MD Entered By: Linton Ham on 06/08/2020 09:59:05 -------------------------------------------------------------------------------- Physician Orders Details Patient Name: Date of Service: [place], [name] LD O. 06/08/2020 8:30 A M Medical Record Number: [id number] Patient Account Number: [id number] Date of Birth/Sex: Treating RN: 04/11/1931 (85 y.o. [name] Primary Care Provider: [name] Other Clinician: Referring Provider: Treating Provider/Extender: Alinda Sierras in Treatment: 5 Verbal / Phone Orders: No Diagnosis Coding Follow-up Appointments Return Appointment in 1 week. Bathing/ Shower/ Hygiene May shower and wash wound with soap and water. Wound Treatment Wound #1 - Forearm Wound Laterality: Left, Proximal Cleanser: Soap and Water 1 x Per LYY/50 Days Discharge Instructions: May shower and wash wound with dial antibacterial soap and water prior to dressing change. Prim Dressing: Hydrofera Blue Classic Foam, 2x2 in 1 x Per Day/15 Days ary Discharge Instructions: Moisten with saline prior to applying to wound bed Secondary Dressing: T Non-adherent Dressing, 2x3 in (Generic) 1 x Per Day/15 Days elfa Discharge Instructions: Apply over primary dressing as directed. Secured With: Child psychotherapist, Sterile 2x75 (in/in) (Generic) 1 x Per Day/15 Days Discharge Instructions: Secure with stretch gauze as directed. Secured With: 5M Medipore H Soft Cloth Surgical T 4 x 2 (in/yd) (Generic) 1 x Per Day/15 Days ape Discharge Instructions: Secure dressing with tape as directed. Wound #4 - Upper Arm Wound Laterality: Left Cleanser: Soap and Water 1 x Per Day/15 Days Discharge Instructions: May shower and wash wound with dial antibacterial soap  and water prior to dressing change. Prim Dressing: Hydrofera Blue Classic Foam, 2x2 in 1 x Per Day/15 Days ary Discharge Instructions: Moisten with saline prior to applying to wound bed Secondary Dressing: T Non-adherent Dressing, 2x3 in (Generic) 1 x Per Day/15 Days elfa Discharge Instructions: Apply over primary dressing as directed. Secured With: Child psychotherapist, Sterile 2x75 (in/in) (Generic) 1 x Per Day/15 Days Discharge Instructions: Secure with stretch gauze as directed. Secured With: 5M Medipore H Soft Cloth Surgical T 4 x 2 (in/yd) (Generic) 1 x Per Day/15 Days ape Discharge Instructions: Secure dressing with tape as directed. Electronic Signature(s) Signed: 06/08/2020 5:27:47 PM By: Rhae Hammock RN Signed: 06/08/2020 5:45:41 PM By: Linton Ham MD Entered By: Rhae Hammock on 06/08/2020 09:42:06 -------------------------------------------------------------------------------- Problem List Details Patient Name: Date of Service: [place], [place] LD O. 06/08/2020 8:30 A M Medical Record Number: [id number] Patient Account Number: [id number] Date of Birth/Sex: Treating RN: 18-Aug-1931 (30 y.o. [name], [name] Primary Care Provider: [name] Other Clinician: Referring Provider: Treating Provider/Extender: Alinda Sierras in Treatment: 5 Active Problems ICD-10 Encounter Code Description Active Date MDM Diagnosis S51.802D Unspecified open wound of left forearm, subsequent encounter 05/11/2020 No Yes S41.102D Unspecified open wound of left upper arm, subsequent encounter 06/08/2020 No Yes I10 Essential (primary) hypertension 05/04/2020 No Yes I87.2 Venous insufficiency (chronic) (peripheral) 05/04/2020 No Yes E11.9 Type 2 diabetes mellitus without complications 07/06/1698 No Yes I48.91 Unspecified atrial fibrillation 05/04/2020 No Yes Inactive Problems ICD-10 Code Description Active Date Inactive Date S51.802A Unspecified open  wound of left forearm, initial encounter 05/04/2020 05/04/2020 S51.801A Unspecified open wound of right forearm, initial encounter 05/04/2020 05/04/2020 Resolved Problems ICD-10 Code Description Active Date Resolved Date S51.801D Unspecified open wound of right forearm, subsequent encounter 05/11/2020 05/11/2020 Electronic Signature(s) Signed: 06/08/2020 5:45:41 PM By: Linton Ham MD Entered By: Linton Ham on 06/08/2020 09:56:23 -------------------------------------------------------------------------------- Progress Note Details Patient Name: Date of Service: [place], [name] LD O. 06/08/2020 8:30 A M Medical Record Number:  [id number] Patient Account Number: [id number] Date of Birth/Sex: Treating RN: 19-Feb-1931 (68 y.o. [name], [name] Primary Care Provider: [name] Other Clinician: Referring Provider: Treating Provider/Extender: Alinda Sierras in Treatment: 5 Subjective History of Present Illness (HPI) Admission 5/3 Mr. Digioia is an 87 year old male with a past medical history of essential hypertension, type 2 diabetes on oral agents, atrial fibrillation on warfarin, and chronic venous insufficiency That presents today for 3 wounds. 2 are located to his left forearm and one is on the right wrist. He developed the most distal wound to his left forearm 1 month ago when he was reaching behind the headboard and developed a skin tear. This has been healing well for the past month. The other 2 wounds were developed after removing a Band-Aid from the area. These are also limited to skin tears. These developed about 1 week ago. He has been using a T pad and Band-Aid to the wounds. He overall feels well denies any signs of infection. elfa 5/10; patient presents for 1 week follow-up. He has been using collagen to the 3 wounds. 2 of the wounds have closed. He has no complaints or issues today. He denies acute signs of infection. 5/24; patient presents for 2-week  follow-up. He has been doing dressing changes with collagen. He has no complaints today or issues. He denies signs of infection. 6/7; the patient arrives in clinic with nothing open on his right arm. He still has the open area on the left arm but has a new area on the left posterior humerus just above the olecranon. Again he relates this to I think making the bed and trauma on the headboard. He has been using silver collagen to his wounds Objective Constitutional Sitting or standing Blood Pressure is within target range for patient.. Pulse regular and within target range for patient.Marland Kitchen Respirations regular, non-labored and within target range.. Temperature is normal and within the target range for the patient.Marland Kitchen Appears in no distress. Vitals Time Taken: 9:07 AM, Height: 69 in, Source: Stated, Weight: 213 lbs, Source: Stated, BMI: 31.5, Temperature: 97.8 F, Pulse: 75 bpm, Respiratory Rate: 18 breaths/min, Blood Pressure: 142/55 mmHg. General Notes: Wound exam; left arm. He has a hyper granulated area on the left dorsal arm which I think was part of his original wound presentation. He has a new superficial area on the posterior left humerus just above the olecranon. This looks like a skin tear. Very fragile skin likely chronic solar damage. Nothing here looks malignant Integumentary (Hair, Skin) Wound #1 status is Open. Original cause of wound was Trauma. The date acquired was: 04/02/2020. The wound has been in treatment 5 weeks. The wound is located on the Left,Proximal Forearm. The wound measures 1cm length x 1.1cm width x 0.1cm depth; 0.864cm^2 area and 0.086cm^3 volume. There is Fat Layer (Subcutaneous Tissue) exposed. There is no tunneling or undermining noted. There is a medium amount of sanguinous drainage noted. The wound margin is flat and intact. There is large (67-100%) red, friable, hyper - granulation within the wound bed. There is no necrotic tissue within the wound bed. Wound #4 status  is Open. Original cause of wound was Trauma. The date acquired was: 06/04/2020. The wound is located on the Left Upper Arm. The wound measures 0.7cm length x 2.1cm width x 0.1cm depth; 1.155cm^2 area and 0.115cm^3 volume. There is Fat Layer (Subcutaneous Tissue) exposed. There is no tunneling or undermining noted. There is a medium amount of serosanguineous drainage noted. The wound margin  is flat and intact. There is large (67-100%) red granulation within the wound bed. There is no necrotic tissue within the wound bed. Assessment Active Problems ICD-10 Unspecified open wound of left forearm, subsequent encounter Unspecified open wound of left upper arm, subsequent encounter Essential (primary) hypertension Venous insufficiency (chronic) (peripheral) Type 2 diabetes mellitus without complications Unspecified atrial fibrillation Procedures Wound #1 Pre-procedure diagnosis of Wound #1 is a Skin T located on the Left,Proximal Forearm . An Chemical Cauterization procedure was performed by Dellia Nims, ear Memory Argue., MD. Post procedure Diagnosis Wound #1: Same as Pre-Procedure Notes: silver nitrate applied to hypergranulation Plan Follow-up Appointments: Return Appointment in 1 week. Bathing/ Shower/ Hygiene: May shower and wash wound with soap and water. WOUND #1: - Forearm Wound Laterality: Left, Proximal Cleanser: Soap and Water 1 x Per Day/15 Days Discharge Instructions: May shower and wash wound with dial antibacterial soap and water prior to dressing change. Prim Dressing: Hydrofera Blue Classic Foam, 2x2 in 1 x Per Day/15 Days ary Discharge Instructions: Moisten with saline prior to applying to wound bed Secondary Dressing: T Non-adherent Dressing, 2x3 in (Generic) 1 x Per Day/15 Days elfa Discharge Instructions: Apply over primary dressing as directed. Secured With: Child psychotherapist, Sterile 2x75 (in/in) (Generic) 1 x Per Day/15 Days Discharge Instructions: Secure with  stretch gauze as directed. Secured With: 1M Medipore H Soft Cloth Surgical T 4 x 2 (in/yd) (Generic) 1 x Per Day/15 Days ape Discharge Instructions: Secure dressing with tape as directed. WOUND #4: - Upper Arm Wound Laterality: Left Cleanser: Soap and Water 1 x Per Day/15 Days Discharge Instructions: May shower and wash wound with dial antibacterial soap and water prior to dressing change. Prim Dressing: Hydrofera Blue Classic Foam, 2x2 in 1 x Per Day/15 Days ary Discharge Instructions: Moisten with saline prior to applying to wound bed Secondary Dressing: T Non-adherent Dressing, 2x3 in (Generic) 1 x Per Day/15 Days elfa Discharge Instructions: Apply over primary dressing as directed. Secured With: Child psychotherapist, Sterile 2x75 (in/in) (Generic) 1 x Per Day/15 Days Discharge Instructions: Secure with stretch gauze as directed. Secured With: 1M Medipore H Soft Cloth Surgical T 4 x 2 (in/yd) (Generic) 1 x Per Day/15 Days ape Discharge Instructions: Secure dressing with tape as directed. 1. I discontinued the collagen because of the hypergranulation in the forearm. Switch to [facility]. I used silver nitrate to remove the hypergranulation on the forearm. Hydrofera Blue should help with this. Electronic Signature(s) Signed: 08/17/2020 1:31:14 PM By: Linton Ham MD Signed: 09/30/2020 4:51:55 PM By: Levan Hurst RN, BSN Previous Signature: 06/08/2020 5:45:41 PM Version By: Linton Ham MD Entered By: Levan Hurst on 08/10/2020 08:13:09 -------------------------------------------------------------------------------- SuperBill Details Patient Name: Date of Service: [place], [place] NA LD O. 06/08/2020 Medical Record Number: [id number] Patient Account Number: [id number] Date of Birth/Sex: Treating RN: 23-Aug-1931 (65 y.o. [name], [name] Primary Care Provider: [name] Other Clinician: Referring Provider: Treating Provider/Extender: Alinda Sierras in Treatment: 5 Diagnosis Coding ICD-10 Codes Code Description [phone] Unspecified open wound of left forearm, subsequent encounter S51.801D Unspecified open wound of right forearm, subsequent encounter I10 Essential (primary) hypertension I87.2 Venous insufficiency (chronic) (peripheral) E11.9 Type 2 diabetes mellitus without complications D22.02 Unspecified atrial fibrillation Facility Procedures CPT4 Code: [id number] Description: 99213 - WOUND CARE VISIT-LEV 3 EST PT Modifier: Quantity: 1 CPT4 Code: [id number] Description: 76160 - CHEM CAUT GRANULATION TISS ICD-10 Diagnosis Description S51.802D Unspecified open wound of left forearm, subsequent encounter Modifier: Quantity: 1 Physician Procedures :  CPT4 Code Description Modifier [id number] - WC PHYS CHEM CAUT GRAN TISSUE ICD-10 Diagnosis Description S51.802D Unspecified open wound of left forearm, subsequent encounter Quantity: 1 Electronic Signature(s) Signed: 06/08/2020 5:45:41 PM By: Linton Ham MD Entered By: Linton Ham on 06/08/2020 10:01:56

## 2020-06-09 NOTE — Progress Notes (Signed)
[name] ([id number]) Visit Report for 06/08/2020 Arrival Information Details Patient Name: Date of Service: [place], [place] Stephen LD O. 06/08/2020 8:30 A M Medical Record Number: [id number] Patient Account Number: [id number] Date of Birth/Sex: Treating Edwards: [birth year] (87 y.o. [name], [name] Primary Care [name]: [name] Other Clinician: Referring [name]: Treating [name]/Extender: [name] in Treatment: 5 Visit Information History Since Last Visit Added or deleted any medications: No Patient Arrived: Ambulatory Any new allergies or adverse reactions: No Arrival Time: 09:05 Had a fall or experienced change in No Accompanied By: self activities of daily living that may affect Transfer Assistance: None risk of falls: Patient Identification Verified: Yes Signs or symptoms of abuse/neglect since last visito No Secondary Verification Process Completed: Yes Hospitalized since last visit: No Patient Requires Transmission-Based Precautions: No Implantable device outside of the clinic excluding No Patient Has Alerts: Yes cellular tissue based products placed in the center Patient Alerts: Patient on Blood Thinner since last visit: Has Dressing in Place as Prescribed: Yes Pain Present Now: No Electronic Signature(s) Signed: 06/08/2020 2:13:29 PM By: [name] Entered By: [name] -------------------------------------------------------------------------------- Clinic Level of Care Assessment Details Patient Name: Date of Service: [place], [place] Stephen LD O. 06/08/2020 8:30 A M Medical Record Number: [id number] Patient Account Number: [id number] Date of Birth/Sex: Treating Edwards: 1931-04-29 (54 y.o. [name], [place] Primary Care [name]: [name] Other Clinician: Referring [name]: Treating [name]/Extender: [name] in Treatment: 5 Clinic Level of Care Assessment  Items TOOL 4 Quantity Score X- 1 0 Use when only an EandM is performed on FOLLOW-UP visit ASSESSMENTS - Nursing Assessment / Reassessment X- 1 10 Reassessment of Co-morbidities (includes updates in patient status) X- 1 5 Reassessment of Adherence to Treatment Plan ASSESSMENTS - Wound and Skin A ssessment / Reassessment X - Simple Wound Assessment / Reassessment - one wound 1 5 []  - 0 Complex Wound Assessment / Reassessment - multiple wounds X- 1 10 Dermatologic / Skin Assessment (not related to wound area) ASSESSMENTS - Focused Assessment []  - 0 Circumferential Edema Measurements - multi extremities []  - 0 Nutritional Assessment / Counseling / Intervention []  - 0 Lower Extremity Assessment (monofilament, tuning fork, pulses) []  - 0 Peripheral Arterial Disease Assessment (using hand held doppler) ASSESSMENTS - Ostomy and/or Continence Assessment and Care []  - 0 Incontinence Assessment and Management []  - 0 Ostomy Care Assessment and Management (repouching, etc.) PROCESS - Coordination of Care X - Simple Patient / Family Education for ongoing care 1 15 []  - 0 Complex (extensive) Patient / Family Education for ongoing care X- 1 10 Staff obtains Programmer, systems, Records, T Results / Process Orders est []  - 0 Staff telephones HHA, Nursing Homes / Clarify orders / etc []  - 0 Routine Transfer to another Facility (non-emergent condition) []  - 0 Routine Hospital Admission (non-emergent condition) []  - 0 New Admissions / Biomedical engineer / Ordering NPWT Apligraf, etc. , []  - 0 Emergency Hospital Admission (emergent condition) X- 1 10 Simple Discharge Coordination []  - 0 Complex (extensive) Discharge Coordination PROCESS - Special Needs []  - 0 Pediatric / Minor Patient Management []  - 0 Isolation Patient Management []  - 0 Hearing / Language / Visual special needs []  - 0 Assessment of Community assistance (transportation, D/C planning, etc.) []  - 0 Additional  assistance / Altered mentation []  - 0 Support Surface(s) Assessment (bed, cushion, seat, etc.) INTERVENTIONS - Wound Cleansing / Measurement X - Simple Wound Cleansing - one wound 1 5 []  -  0 Complex Wound Cleansing - multiple wounds X- 1 5 Wound Imaging (photographs - any number of wounds) $RemoveBe'[]'RPaBdYXcW$  - 0 Wound Tracing (instead of photographs) X- 1 5 Simple Wound Measurement - one wound $RemoveB'[]'pcBaQami$  - 0 Complex Wound Measurement - multiple wounds INTERVENTIONS - Wound Dressings X - Small Wound Dressing one or multiple wounds 1 10 $Re'[]'SoP$  - 0 Medium Wound Dressing one or multiple wounds $RemoveBeforeD'[]'WVCkZCumTDvUDf$  - 0 Large Wound Dressing one or multiple wounds $RemoveBeforeD'[]'wqHoxNHsZdJFzy$  - 0 Application of Medications - topical $RemoveB'[]'eTgVDIfR$  - 0 Application of Medications - injection INTERVENTIONS - Miscellaneous $RemoveBeforeD'[]'PUafpYWTaMjeUr$  - 0 External ear exam $Remove'[]'RPLkSYF$  - 0 Specimen Collection (cultures, biopsies, blood, body fluids, etc.) $RemoveBefor'[]'hNVFRGjMdXqi$  - 0 Specimen(s) / Culture(s) sent or taken to Lab for analysis $RemoveBefo'[]'iDEbRspIDGS$  - 0 Patient Transfer (multiple staff / Civil Service fast streamer / Similar devices) $RemoveBeforeDE'[]'xByXomNDWeFEtyR$  - 0 Simple Staple / Suture removal (25 or less) $Remove'[]'fjLXEDa$  - 0 Complex Staple / Suture removal (26 or more) $Remove'[]'xCBJaTD$  - 0 Hypo / Hyperglycemic Management (close monitor of Blood Glucose) $RemoveBefore'[]'KuIftDffMPkTO$  - 0 Ankle / Brachial Index (ABI) - do not check if billed separately X- 1 5 Vital Signs Has the patient been seen at the hospital within the last three years: Yes Total Score: 95 Level Of Care: New/Established - Level 3 Electronic Signature(s) Signed: 06/08/2020 5:27:47 PM By: [name] Entered By: [name] on 06/08/2020 09:44:01 -------------------------------------------------------------------------------- Encounter Discharge Information Details Patient Name: Date of Service: [place], [name] LD O. 06/08/2020 8:30 A M Medical Record Number: [id number] Patient Account Number: [id number] Date of Birth/Sex: Treating Edwards: Dec 20, 1931 (87 y.o. Marcheta Grammes Primary Care Belynda Pagaduan: [name] Other  Clinician: Referring Angelyne Terwilliger: Treating Kevaughn Ewing/Extender: [name] in Treatment: 5 Encounter Discharge Information Items Discharge Condition: Stable Ambulatory Status: Ambulatory Discharge Destination: Home Transportation: Private Auto Schedule Follow-up Appointment: Yes Clinical Summary of Care: Provided on 06/08/2020 Form Type Recipient Paper Patient Patient Electronic Signature(s) Signed: 06/08/2020 11:27:53 AM By: Lorrin Jackson Entered By: Lorrin Jackson on 06/08/2020 11:27:52 -------------------------------------------------------------------------------- Lower Extremity Assessment Details Patient Name: Date of Service: [place], [place] NA LD O. 06/08/2020 8:30 A M Medical Record Number: [id number] Patient Account Number: [id number] Date of Birth/Sex: Treating Edwards: February 18, 1931 (3 y.o. [name] Primary Care Nessie Nong: [name] Other Clinician: Referring [place] Fant: Treating Timmia Cogburn/Extender: [name] in Treatment: 5 Electronic Signature(s) Signed: 06/09/2020 6:12:56 PM By: Baruch Gouty Edwards, BSN Entered By: Baruch Gouty on 06/08/2020 09:09:13 -------------------------------------------------------------------------------- Multi Wound Chart Details Patient Name: Date of Service: [place], [place] NA LD O. 06/08/2020 8:30 A M Medical Record Number: [id number] Patient Account Number: [id number] Date of Birth/Sex: Treating Edwards: [birth year] (40 y.o. [name], [name] Primary Care Elnora Quizon: [name] Other Clinician: Referring Jaelin Devincentis: Treating Ott Zimmerle/Extender: [name] in Treatment: 5 Vital Signs Height(in): 3 Pulse(bpm): 53 Weight(lbs): 213 Blood Pressure(mmHg): 142/55 Body Mass Index(BMI): 31 Temperature(F): 97.8 Respiratory Rate(breaths/min): 18 Photos: [1:No Photos Left, Proximal Forearm] [4:No Photos Left Upper Arm] [N/A:N/A N/A] Wound Location: [1:Trauma]  [4:Trauma] [N/A:N/A] Wounding Event: [1:Skin T ear] [4:Skin T ear] [N/A:N/A] Primary Etiology: [1:Anemia, Arrhythmia, Hypertension, Anemia, Arrhythmia, Hypertension, N/A] Comorbid History: [1:Peripheral Venous Disease, Type II Peripheral Venous Disease, Type II Diabetes, Gout, Confinement Anxiety Diabetes, Gout, Confinement Anxiety 04/02/2020] [4:06/04/2020] [N/A:N/A] Date Acquired: [1:5] [4:0] [N/A:N/A] Weeks of Treatment: [1:Open] [4:Open] [N/A:N/A] Wound Status: [1:1x1.1x0.1] [4:0.7x2.1x0.1] [N/A:N/A] Measurements L x W x D (cm) [1:0.864] [4:1.155] [N/A:N/A] A (cm) : rea [1:0.086] [4:0.115] [N/A:N/A] Volume (cm) : [1:88.40%] [4:N/A] [N/A:N/A] % Reduction in Area: [1:88.50%] [4:N/A] [  N/A:N/A] % Reduction in Volume: [1:Full Thickness Without Exposed] [4:Full Thickness Without Exposed] [N/A:N/A] Classification: [1:Support Structures Medium] [4:Support Structures Medium] [N/A:N/A] Exudate Amount: [1:Sanguinous] [4:Serosanguineous] [N/A:N/A] Exudate Type: [1:red] [4:red, brown] [N/A:N/A] Exudate Color: [1:Flat and Intact] [4:Flat and Intact] [N/A:N/A] Wound Margin: [1:Large (67-100%)] [4:Large (67-100%)] [N/A:N/A] Granulation Amount: [1:Red, Hyper-granulation, Friable] [4:Red] [N/A:N/A] Granulation Quality: [1:None Present (0%)] [4:None Present (0%)] [N/A:N/A] Necrotic Amount: [1:Fat Layer (Subcutaneous Tissue): Yes Fat Layer (Subcutaneous Tissue): Yes N/A] Exposed Structures: [1:Fascia: No Tendon: No Muscle: No Joint: No Bone: No Small (1-33%)] [4:Fascia: No Tendon: No Muscle: No Joint: No Bone: No Medium (34-66%)] [N/A:N/A] Treatment Notes Electronic Signature(s) Signed: 06/08/2020 5:27:47 PM By: [name] Signed: 06/08/2020 5:45:41 PM By: Linton Ham MD Entered By: Linton Ham on 06/08/2020 09:56:35 -------------------------------------------------------------------------------- Multi-Disciplinary Care Plan Details Patient Name: Date of Service: [place], [place] NA LD O.  06/08/2020 8:30 A M Medical Record Number: [id number] Patient Account Number: [id number] Date of Birth/Sex: Treating Edwards: 11-Jan-1931 (41 y.o. [name], [name] Primary Care Rehema Muffley: [name] Other Clinician: Referring Cynda Soule: Treating Shekelia Boutin/Extender: [name] in Treatment: 5 Active Inactive Abuse / Safety / Falls / Self Care Management Nursing Diagnoses: History of Falls Potential for falls Goals: Patient/caregiver will verbalize understanding of skin care regimen Date Initiated: 05/04/2020 Target Resolution Date: 07/02/2020 Goal Status: Active Interventions: Assess self care needs on admission and as needed Provide education on fall prevention Notes: Pain, Acute or Chronic Nursing Diagnoses: Potential alteration in comfort, pain Goals: Patient will verbalize adequate pain control and receive pain control interventions during procedures as needed Date Initiated: 05/04/2020 Target Resolution Date: 07/03/2020 Goal Status: Active Patient/caregiver will verbalize comfort level met Date Initiated: 05/04/2020 Target Resolution Date: 07/02/2020 Goal Status: Active Interventions: Provide education on pain management Reposition patient for comfort Notes: Wound/Skin Impairment Nursing Diagnoses: Knowledge deficit related to ulceration/compromised skin integrity Goals: Patient/caregiver will verbalize understanding of skin care regimen Date Initiated: 05/04/2020 Target Resolution Date: 07/31/2020 Goal Status: Active Interventions: Assess patient/caregiver ability to obtain necessary supplies Assess patient/caregiver ability to perform ulcer/skin care regimen upon admission and as needed Provide education on ulcer and skin care Treatment Activities: Skin care regimen initiated : 05/04/2020 Topical wound management initiated : 05/04/2020 Notes: Electronic Signature(s) Signed: 06/08/2020 5:27:47 PM By: [name] Entered By: [name] on 06/08/2020 09:43:05 -------------------------------------------------------------------------------- Pain Assessment Details Patient Name: Date of Service: [place], [name] LD O. 06/08/2020 8:30 A M Medical Record Number: [id number] Patient Account Number: [id number] Date of Birth/Sex: Treating Edwards: [birth year] (5 y.o. [name] Primary Care Reyce Lubeck: [name] Other Clinician: Referring Sylar Voong: Treating Allaya Abbasi/Extender: [name] in Treatment: 5 Active Problems Location of Pain Severity and Description of Pain Patient Has Paino No Site Locations Rate the pain. Current Pain Level: 0 Pain Management and Medication Current Pain Management: Electronic Signature(s) Signed: 06/09/2020 6:12:56 PM By: Baruch Gouty Edwards, BSN Entered By: Baruch Gouty on 06/08/2020 09:09:05 -------------------------------------------------------------------------------- Patient/Caregiver Education Details Patient Name: Date of Service: [place] NA LD Jenetta Downer 6/7/2022andnbsp8:30 A M Medical Record Number: [id number] Patient Account Number: [id number] Date of Birth/Gender: Treating Edwards: 08-31-1931 (48 y.o. [name] Primary Care Physician: [name] Other Clinician: Referring Physician: Treating Physician/Extender: [name] in Treatment: 5 Education Assessment Education Provided To: Patient Education Topics Provided Wound/Skin Impairment: Methods: Explain/Verbal Responses: State content correctly Electronic Signature(s) Signed: 06/08/2020 5:27:47 PM By: [name] Entered By: [name] on 06/08/2020 09:43:22 -------------------------------------------------------------------------------- Wound Assessment Details Patient Name: Date of  Service: [place], [place] NA LD O. 06/08/2020 8:30 A M Medical Record Number: [id number] Patient Account Number: [id number] Date of Birth/Sex: Treating  Edwards: 17-Mar-1931 (54 y.o. [name] Primary Care Asjia Berrios: [name] Other Clinician: Referring Darling Cieslewicz: Treating Tata Timmins/Extender: [name] in Treatment: 5 Wound Status Wound Number: 1 Primary Skin Tear Etiology: Wound Location: Left, Proximal Forearm Wound Open Wounding Event: Trauma Status: Date Acquired: 04/02/2020 Comorbid Anemia, Arrhythmia, Hypertension, Peripheral Venous Disease, Weeks Of Treatment: 5 History: Type II Diabetes, Gout, Confinement Anxiety Clustered Wound: No Photos Wound Measurements Length: (cm) 1 Width: (cm) 1.1 Depth: (cm) 0.1 Area: (cm) 0.864 Volume: (cm) 0.086 % Reduction in Area: 88.4% % Reduction in Volume: 88.5% Epithelialization: Small (1-33%) Tunneling: No Undermining: No Wound Description Classification: Full Thickness Without Exposed Support Structures Wound Margin: Flat and Intact Exudate Amount: Medium Exudate Type: Sanguinous Exudate Color: red Foul Odor After Cleansing: No Slough/Fibrino No Wound Bed Granulation Amount: Large (67-100%) Exposed Structure Granulation Quality: Red, Hyper-granulation, Friable Fascia Exposed: No Necrotic Amount: None Present (0%) Fat Layer (Subcutaneous Tissue) Exposed: Yes Tendon Exposed: No Muscle Exposed: No Joint Exposed: No Bone Exposed: No Treatment Notes Wound #1 (Forearm) Wound Laterality: Left, Proximal Cleanser Soap and Water Discharge Instruction: May shower and wash wound with dial antibacterial soap and water prior to dressing change. Peri-Wound Care Topical Primary Dressing Hydrofera Blue Classic Foam, 2x2 in Discharge Instruction: Moisten with saline prior to applying to wound bed Secondary Dressing T Non-adherent Dressing, 2x3 in elfa Discharge Instruction: Apply over primary dressing as directed. Secured With Conforming Stretch Gauze Bandage, Sterile 2x75 (in/in) Discharge Instruction: Secure with stretch gauze as  directed. 20M Medipore H Soft Cloth Surgical T 4 x 2 (in/yd) ape Discharge Instruction: Secure dressing with tape as directed. Compression Wrap Compression Stockings Add-Ons Electronic Signature(s) Signed: 06/08/2020 5:03:12 PM By: [name] Signed: 06/09/2020 6:12:56 PM By: Baruch Gouty Edwards, BSN Entered By: [name] on 06/08/2020 16:35:59 -------------------------------------------------------------------------------- Wound Assessment Details Patient Name: Date of Service: [place], [name] LD O. 06/08/2020 8:30 A M Medical Record Number: [id number] Patient Account Number: [id number] Date of Birth/Sex: Treating Edwards: September 01, 1931 (43 y.o. [name] Primary Care Sherise Geerdes: [name] Other Clinician: Referring Desma Wilkowski: Treating Raysa Bosak/Extender: [name] in Treatment: 5 Wound Status Wound Number: 4 Primary Skin Tear Etiology: Wound Location: Left Upper Arm Wound Open Wounding Event: Trauma Status: Date Acquired: 06/04/2020 Comorbid Anemia, Arrhythmia, Hypertension, Peripheral Venous Disease, Weeks Of Treatment: 0 History: Type II Diabetes, Gout, Confinement Anxiety Clustered Wound: No Photos Wound Measurements Length: (cm) 0.7 Width: (cm) 2.1 Depth: (cm) 0.1 Area: (cm) 1.155 Volume: (cm) 0.115 % Reduction in Area: 0% % Reduction in Volume: 0% Epithelialization: Medium (34-66%) Tunneling: No Undermining: No Wound Description Classification: Full Thickness Without Exposed Support Structures Wound Margin: Flat and Intact Exudate Amount: Medium Exudate Type: Serosanguineous Exudate Color: red, brown Foul Odor After Cleansing: No Slough/Fibrino No Wound Bed Granulation Amount: Large (67-100%) Exposed Structure Granulation Quality: Red Fascia Exposed: No Necrotic Amount: None Present (0%) Fat Layer (Subcutaneous Tissue) Exposed: Yes Tendon Exposed: No Muscle Exposed: No Joint Exposed: No Bone Exposed:  No Treatment Notes Wound #4 (Upper Arm) Wound Laterality: Left Cleanser Soap and Water Discharge Instruction: May shower and wash wound with dial antibacterial soap and water prior to dressing change. Peri-Wound Care Topical Primary Dressing Hydrofera Blue Classic Foam, 2x2 in Discharge Instruction: Moisten with saline prior to applying to wound bed Secondary Dressing T Non-adherent Dressing, 2x3 in elfa Discharge Instruction: Apply  over primary dressing as directed. Secured With Conforming Stretch Gauze Bandage, Sterile 2x75 (in/in) Discharge Instruction: Secure with stretch gauze as directed. 6M Medipore H Soft Cloth Surgical T 4 x 2 (in/yd) ape Discharge Instruction: Secure dressing with tape as directed. Compression Wrap Compression Stockings Add-Ons Electronic Signature(s) Signed: 06/08/2020 5:03:12 PM By: [name] Signed: 06/09/2020 6:12:56 PM By: Baruch Gouty Edwards, BSN Entered By: [name] on 06/08/2020 16:35:31 -------------------------------------------------------------------------------- Vitals Details Patient Name: Date of Service: [place], [name] LD O. 06/08/2020 8:30 A M Medical Record Number: [id number] Patient Account Number: [id number] Date of Birth/Sex: Treating Edwards: 04/03/1931 (32 y.o. [name] Primary Care Nabeel Gladson: [name] Other Clinician: Referring Rachella Basden: Treating Charleen Madera/Extender: [name] in Treatment: 5 Vital Signs Time Taken: 09:07 Temperature (F): 97.8 Height (in): 69 Pulse (bpm): 75 Source: Stated Respiratory Rate (breaths/min): 18 Weight (lbs): 213 Blood Pressure (mmHg): 142/55 Source: Stated Reference Range: 80 - 120 mg / dl Body Mass Index (BMI): 31.5 Electronic Signature(s) Signed: 06/09/2020 6:12:56 PM By: Baruch Gouty Edwards, BSN Entered By: Baruch Gouty on 06/08/2020 09:08:10

## 2020-06-11 ENCOUNTER — Other Ambulatory Visit: Payer: Self-pay | Admitting: Family Medicine

## 2020-06-11 NOTE — Telephone Encounter (Signed)
LOV 03/21/19 NOV none

## 2020-06-15 ENCOUNTER — Encounter (HOSPITAL_BASED_OUTPATIENT_CLINIC_OR_DEPARTMENT_OTHER): Payer: Medicare HMO | Admitting: Internal Medicine

## 2020-06-15 ENCOUNTER — Other Ambulatory Visit: Payer: Self-pay

## 2020-06-15 DIAGNOSIS — E1122 Type 2 diabetes mellitus with diabetic chronic kidney disease: Secondary | ICD-10-CM | POA: Diagnosis not present

## 2020-06-15 DIAGNOSIS — E1151 Type 2 diabetes mellitus with diabetic peripheral angiopathy without gangrene: Secondary | ICD-10-CM | POA: Diagnosis not present

## 2020-06-15 DIAGNOSIS — I872 Venous insufficiency (chronic) (peripheral): Secondary | ICD-10-CM | POA: Diagnosis not present

## 2020-06-15 DIAGNOSIS — Z833 Family history of diabetes mellitus: Secondary | ICD-10-CM | POA: Diagnosis not present

## 2020-06-15 DIAGNOSIS — E11622 Type 2 diabetes mellitus with other skin ulcer: Secondary | ICD-10-CM | POA: Diagnosis not present

## 2020-06-15 DIAGNOSIS — N182 Chronic kidney disease, stage 2 (mild): Secondary | ICD-10-CM | POA: Diagnosis not present

## 2020-06-15 DIAGNOSIS — S51802D Unspecified open wound of left forearm, subsequent encounter: Secondary | ICD-10-CM | POA: Diagnosis not present

## 2020-06-15 DIAGNOSIS — Z7901 Long term (current) use of anticoagulants: Secondary | ICD-10-CM | POA: Diagnosis not present

## 2020-06-15 DIAGNOSIS — I129 Hypertensive chronic kidney disease with stage 1 through stage 4 chronic kidney disease, or unspecified chronic kidney disease: Secondary | ICD-10-CM | POA: Diagnosis not present

## 2020-06-15 DIAGNOSIS — L98492 Non-pressure chronic ulcer of skin of other sites with fat layer exposed: Secondary | ICD-10-CM | POA: Diagnosis not present

## 2020-06-15 NOTE — Progress Notes (Signed)
Stephen, Edwards ([id number]) Visit Report for 06/15/2020 HPI Details Patient Name: Date of Service: [place], [place] LD O. 06/15/2020 12:30 PM Medical Record Number: [id number] Patient Account Number: [id number] Date of Birth/Sex: Treating RN: [birth year] (87 y.o. [name], [name] Primary Care Provider: [name] Other Clinician: Referring Provider: Treating Provider/Extender: Alinda Sierras in Treatment: 6 History of Present Illness HPI Description: Admission 5/3 Mr. [name] is an 87 year old male with a past medical history of essential hypertension, type 2 diabetes on oral agents, atrial fibrillation on warfarin, and chronic venous insufficiency That presents today for 3 wounds. 2 are located to his left forearm and one is on the right wrist. He developed the most distal wound to his left forearm 1 month ago when he was reaching behind the headboard and developed a skin tear. This has been healing well for the past month. The other 2 wounds were developed after removing a Band-Aid from the area. These are also limited to skin tears. These developed about 1 week ago. He has been using a T pad and Band-Aid to the wounds. He overall feels well denies any signs of infection. elfa 5/10; patient presents for 1 week follow-up. He has been using collagen to the 3 wounds. 2 of the wounds have closed. He has no complaints or issues today. He denies acute signs of infection. 5/24; patient presents for 2-week follow-up. He has been doing dressing changes with collagen. He has no complaints today or issues. He denies signs of infection. 6/7; the patient arrives in clinic with nothing open on his right arm. He still has the open area on the left arm but has a new area on the left posterior humerus just above the olecranon. Again he relates this to I think making the bed and trauma on the headboard. He has been using silver collagen to his wounds 6/14; he still has small open  areas on the anterior left forearm and the posterior left humerus we have been using Hydrofera Blue. The wounds measure smaller Electronic Signature(s) Signed: 06/15/2020 4:29:11 PM By: Linton Ham MD Entered By: Linton Ham on 06/15/2020 13:05:07 -------------------------------------------------------------------------------- Physical Exam Details Patient Name: Date of Service: [name], [name] LD O. 06/15/2020 12:30 PM Medical Record Number: [id number] Patient Account Number: [id number] Date of Birth/Sex: Treating RN: Oct 24, 1931 (87 y.o. Erie Noe Primary Care Provider: [name] Other Clinician: Referring Provider: Treating Provider/Extender: Alinda Sierras in Treatment: 6 Constitutional Patient is hypertensive.. Pulse regular and within target range for patient.Marland Kitchen Respirations regular, non-labored and within target range.. Temperature is normal and within the target range for the patient.Marland Kitchen Appears in no distress. Notes Wound exam; left dorsal forearm has a small somewhat mildly hyper granulated wound good I did not debride this today. The area just above the humerus looks like it is mostly epithelializing. No evidence of infection in either area. He has very fragile skin in both upper extremities Electronic Signature(s) Signed: 06/15/2020 4:29:11 PM By: Linton Ham MD Entered By: Linton Ham on 06/15/2020 13:09:00 -------------------------------------------------------------------------------- Physician Orders Details Patient Name: Date of Service: [place], [name] LD O. 06/15/2020 12:30 PM Medical Record Number: [id number] Patient Account Number: [id number] Date of Birth/Sex: Treating RN: 01-26-1931 (46 y.o. [name] Primary Care Provider: [name] Other Clinician: Referring Provider: Treating Provider/Extender: Alinda Sierras in Treatment: 6 Verbal / Phone Orders: No Diagnosis  Coding ICD-10 Coding Code Description [phone] Unspecified open wound of left forearm, subsequent encounter S41.102D Unspecified open  wound of left upper arm, subsequent encounter I10 Essential (primary) hypertension I87.2 Venous insufficiency (chronic) (peripheral) E11.9 Type 2 diabetes mellitus without complications J82.50 Unspecified atrial fibrillation Follow-up Appointments ppointment in 2 weeks. - Tuesday Dr. Heber  Return A Bathing/ Shower/ Hygiene May shower and wash wound with soap and water. Wound Treatment Wound #1 - Forearm Wound Laterality: Left, Proximal Cleanser: Soap and Water 1 x Per NLZ/76 Days Discharge Instructions: May shower and wash wound with dial antibacterial soap and water prior to dressing change. Prim Dressing: Hydrofera Blue Classic Foam, 2x2 in 1 x Per Day/15 Days ary Discharge Instructions: Moisten with saline prior to applying to wound bed Secondary Dressing: T Non-adherent Dressing, 2x3 in (Generic) 1 x Per Day/15 Days elfa Discharge Instructions: Apply over primary dressing as directed. Secured With: Child psychotherapist, Sterile 2x75 (in/in) (Generic) 1 x Per Day/15 Days Discharge Instructions: Secure with stretch gauze as directed. Secured With: 69M Medipore H Soft Cloth Surgical T 4 x 2 (in/yd) (Generic) 1 x Per Day/15 Days ape Discharge Instructions: Secure dressing with tape as directed. Wound #4 - Upper Arm Wound Laterality: Left Cleanser: Soap and Water 1 x Per Day/15 Days Discharge Instructions: May shower and wash wound with dial antibacterial soap and water prior to dressing change. Prim Dressing: Hydrofera Blue Classic Foam, 2x2 in 1 x Per Day/15 Days ary Discharge Instructions: Moisten with saline prior to applying to wound bed Secondary Dressing: T Non-adherent Dressing, 2x3 in (Generic) 1 x Per Day/15 Days elfa Discharge Instructions: Apply over primary dressing as directed. Secured With: Media planner, Sterile 2x75 (in/in) (Generic) 1 x Per Day/15 Days Discharge Instructions: Secure with stretch gauze as directed. Secured With: 69M Medipore H Soft Cloth Surgical T 4 x 2 (in/yd) (Generic) 1 x Per Day/15 Days ape Discharge Instructions: Secure dressing with tape as directed. Electronic Signature(s) Signed: 06/15/2020 4:29:11 PM By: Linton Ham MD Signed: 06/15/2020 6:05:55 PM By: Deon Pilling Entered By: Deon Pilling on 06/15/2020 12:58:39 -------------------------------------------------------------------------------- Problem List Details Patient Name: Date of Service: [place], [place] NA LD O. 06/15/2020 12:30 PM Medical Record Number: [id number] Patient Account Number: [id number] Date of Birth/Sex: Treating RN: [birth year] (70 y.o. [name], [name] Primary Care Provider: [name] Other Clinician: Referring Provider: Treating Provider/Extender: Alinda Sierras in Treatment: 6 Active Problems ICD-10 Encounter Code Description Active Date MDM Diagnosis S51.802D Unspecified open wound of left forearm, subsequent encounter 05/11/2020 No Yes S41.102D Unspecified open wound of left upper arm, subsequent encounter 06/08/2020 No Yes I10 Essential (primary) hypertension 05/04/2020 No Yes I87.2 Venous insufficiency (chronic) (peripheral) 05/04/2020 No Yes E11.9 Type 2 diabetes mellitus without complications 02/06/971 No Yes I48.91 Unspecified atrial fibrillation 05/04/2020 No Yes Inactive Problems ICD-10 Code Description Active Date Inactive Date S51.802A Unspecified open wound of left forearm, initial encounter 05/04/2020 05/04/2020 S51.801A Unspecified open wound of right forearm, initial encounter 05/04/2020 05/04/2020 Resolved Problems ICD-10 Code Description Active Date Resolved Date S51.801D Unspecified open wound of right forearm, subsequent encounter 05/11/2020 05/11/2020 Electronic Signature(s) Signed: 06/15/2020 4:29:11 PM By: Linton Ham  MD Entered By: Linton Ham on 06/15/2020 13:04:20 -------------------------------------------------------------------------------- Progress Note Details Patient Name: Date of Service: [place], [name] LD O. 06/15/2020 12:30 PM Medical Record Number: [id number] Patient Account Number: [id number] Date of Birth/Sex: Treating RN: 02-01-1931 (87 y.o. Erie Noe Primary Care Provider: [name] Other Clinician: Referring Provider: Treating Provider/Extender: Alinda Sierras in Treatment: 6 Subjective History of Present Illness (HPI) Admission 5/3 Mr. Schaab is an  87 year old male with a past medical history of essential hypertension, type 2 diabetes on oral agents, atrial fibrillation on warfarin, and chronic venous insufficiency That presents today for 3 wounds. 2 are located to his left forearm and one is on the right wrist. He developed the most distal wound to his left forearm 1 month ago when he was reaching behind the headboard and developed a skin tear. This has been healing well for the past month. The other 2 wounds were developed after removing a Band-Aid from the area. These are also limited to skin tears. These developed about 1 week ago. He has been using a T pad and Band-Aid to the wounds. He overall feels well denies any signs of infection. elfa 5/10; patient presents for 1 week follow-up. He has been using collagen to the 3 wounds. 2 of the wounds have closed. He has no complaints or issues today. He denies acute signs of infection. 5/24; patient presents for 2-week follow-up. He has been doing dressing changes with collagen. He has no complaints today or issues. He denies signs of infection. 6/7; the patient arrives in clinic with nothing open on his right arm. He still has the open area on the left arm but has a new area on the left posterior humerus just above the olecranon. Again he relates this to I think making the bed and trauma on  the headboard. He has been using silver collagen to his wounds 6/14; he still has small open areas on the anterior left forearm and the posterior left humerus we have been using Hydrofera Blue. The wounds measure smaller Objective Constitutional Patient is hypertensive.. Pulse regular and within target range for patient.Marland Kitchen Respirations regular, non-labored and within target range.. Temperature is normal and within the target range for the patient.Marland Kitchen Appears in no distress. Vitals Time Taken: 12:44 PM, Height: 69 in, Source: Stated, Weight: 213 lbs, Source: Stated, BMI: 31.5, Temperature: 97.7 F, Pulse: 76 bpm, Respiratory Rate: 18 breaths/min, Blood Pressure: 167/62 mmHg. General Notes: Wound exam; left dorsal forearm has a small somewhat mildly hyper granulated wound good I did not debride this today. The area just above the humerus looks like it is mostly epithelializing. No evidence of infection in either area. He has very fragile skin in both upper extremities Integumentary (Hair, Skin) Wound #1 status is Open. Original cause of wound was Trauma. The date acquired was: 04/02/2020. The wound has been in treatment 6 weeks. The wound is located on the Left,Proximal Forearm. The wound measures 0.8cm length x 0.8cm width x 0.1cm depth; 0.503cm^2 area and 0.05cm^3 volume. There is Fat Layer (Subcutaneous Tissue) exposed. There is no tunneling noted. There is a small amount of serosanguineous drainage noted. The wound margin is flat and intact. There is large (67-100%) red granulation within the wound bed. There is no necrotic tissue within the wound bed. Wound #4 status is Open. Original cause of wound was Trauma. The date acquired was: 06/04/2020. The wound has been in treatment 1 weeks. The wound is located on the Left Upper Arm. The wound measures 0.6cm length x 1cm width x 0.1cm depth; 0.471cm^2 area and 0.047cm^3 volume. There is Fat Layer (Subcutaneous Tissue) exposed. There is no tunneling or  undermining noted. There is a small amount of serosanguineous drainage noted. The wound margin is flat and intact. There is large (67-100%) red granulation within the wound bed. There is no necrotic tissue within the wound bed. Assessment Active Problems ICD-10 Unspecified open wound of left forearm, subsequent  encounter Unspecified open wound of left upper arm, subsequent encounter Essential (primary) hypertension Venous insufficiency (chronic) (peripheral) Type 2 diabetes mellitus without complications Unspecified atrial fibrillation Plan Follow-up Appointments: Return Appointment in 2 weeks. - Tuesday Dr. Heber  Bathing/ Shower/ Hygiene: May shower and wash wound with soap and water. WOUND #1: - Forearm Wound Laterality: Left, Proximal Cleanser: Soap and Water 1 x Per Day/15 Days Discharge Instructions: May shower and wash wound with dial antibacterial soap and water prior to dressing change. Prim Dressing: Hydrofera Blue Classic Foam, 2x2 in 1 x Per Day/15 Days ary Discharge Instructions: Moisten with saline prior to applying to wound bed Secondary Dressing: T Non-adherent Dressing, 2x3 in (Generic) 1 x Per Day/15 Days elfa Discharge Instructions: Apply over primary dressing as directed. Secured With: Child psychotherapist, Sterile 2x75 (in/in) (Generic) 1 x Per Day/15 Days Discharge Instructions: Secure with stretch gauze as directed. Secured With: 84M Medipore H Soft Cloth Surgical T 4 x 2 (in/yd) (Generic) 1 x Per Day/15 Days ape Discharge Instructions: Secure dressing with tape as directed. WOUND #4: - Upper Arm Wound Laterality: Left Cleanser: Soap and Water 1 x Per Day/15 Days Discharge Instructions: May shower and wash wound with dial antibacterial soap and water prior to dressing change. Prim Dressing: Hydrofera Blue Classic Foam, 2x2 in 1 x Per Day/15 Days ary Discharge Instructions: Moisten with saline prior to applying to wound bed Secondary Dressing: T  Non-adherent Dressing, 2x3 in (Generic) 1 x Per Day/15 Days elfa Discharge Instructions: Apply over primary dressing as directed. Secured With: Child psychotherapist, Sterile 2x75 (in/in) (Generic) 1 x Per Day/15 Days Discharge Instructions: Secure with stretch gauze as directed. Secured With: 84M Medipore H Soft Cloth Surgical T 4 x 2 (in/yd) (Generic) 1 x Per Day/15 Days ape Discharge Instructions: Secure dressing with tape as directed. 1. I did not change the primary dressing which is Hydrofera Blue/foam/kerlix 2. Patient and his wife are changing the dressing 3. No evidence of infection today 4. No debridement was necessary Electronic Signature(s) Signed: 06/15/2020 4:29:11 PM By: Linton Ham MD Entered By: Linton Ham on 06/15/2020 13:09:52 -------------------------------------------------------------------------------- SuperBill Details Patient Name: Date of Service: [place], [name] LD O. 06/15/2020 Medical Record Number: [id number] Patient Account Number: [id number] Date of Birth/Sex: Treating RN: May 02, 1931 (74 y.o. [name] Primary Care Provider: [name] Other Clinician: Referring Provider: Treating Provider/Extender: Alinda Sierras in Treatment: 6 Diagnosis Coding ICD-10 Codes Code Description [phone] Unspecified open wound of left forearm, subsequent encounter S41.102D Unspecified open wound of left upper arm, subsequent encounter I10 Essential (primary) hypertension I87.2 Venous insufficiency (chronic) (peripheral) E11.9 Type 2 diabetes mellitus without complications T03.54 Unspecified atrial fibrillation Facility Procedures CPT4 Code: [id number] Description: 99214 - WOUND CARE VISIT-LEV 4 EST PT Modifier: Quantity: 1 Physician Procedures : CPT4 Code Description Modifier [id number] - WC PHYS LEVEL 3 - EST PT ICD-10 Diagnosis Description S51.802D Unspecified open wound of left forearm, subsequent encounter  S41.102D Unspecified open wound of left upper arm, subsequent encounter Quantity: 1 Electronic Signature(s) Signed: 06/15/2020 4:29:11 PM By: Linton Ham MD Entered By: Linton Ham on 06/15/2020 13:10:13

## 2020-06-16 NOTE — Progress Notes (Signed)
[name], [name] ([id number]) Visit Report for 06/15/2020 Arrival Information Details Patient Name: Date of Service: [place], [place] LD O. 06/15/2020 12:30 PM Medical Record Number: [id number] Patient Account Number: [id number] Date of Birth/Sex: Treating RN: 04-25-1931 (87 y.o. [name] Primary Care [name]: [name] Other Clinician: Referring Senan Urey: Treating Turhan Chill/Extender: Alinda Sierras in Treatment: 6 Visit Information History Since Last Visit Added or deleted any medications: No Patient Arrived: Ambulatory Any new allergies or adverse reactions: No Arrival Time: 12:37 Had a fall or experienced change in No Accompanied By: self activities of daily living that may affect Transfer Assistance: None risk of falls: Patient Identification Verified: Yes Signs or symptoms of abuse/neglect since last visito No Secondary Verification Process Completed: Yes Hospitalized since last visit: No Patient Requires Transmission-Based Precautions: No Implantable device outside of the clinic excluding No Patient Has Alerts: Yes cellular tissue based products placed in the center Patient Alerts: Patient on Blood Thinner since last visit: Has Dressing in Place as Prescribed: Yes Pain Present Now: No Electronic Signature(s) Signed: 06/15/2020 6:10:01 PM By: Baruch Gouty RN, BSN Entered By: Baruch Gouty on 06/15/2020 12:44:08 -------------------------------------------------------------------------------- Clinic Level of Care Assessment Details Patient Name: Date of Service: [place], [place] LD O. 06/15/2020 12:30 PM Medical Record Number: [id number] Patient Account Number: [id number] Date of Birth/Sex: Treating RN: 1931/09/06 (87 y.o. [name], Meta.Reding Primary Care [name]: [name] Other Clinician: Referring Kaelin Holford: Treating Malaia Buchta/Extender: Alinda Sierras in Treatment: 6 Clinic Level of Care Assessment  Items TOOL 4 Quantity Score X- 1 0 Use when only an EandM is performed on FOLLOW-UP visit ASSESSMENTS - Nursing Assessment / Reassessment X- 1 10 Reassessment of Co-morbidities (includes updates in patient status) X- 1 5 Reassessment of Adherence to Treatment Plan ASSESSMENTS - Wound and Skin A ssessment / Reassessment _0  - 0 Simple Wound Assessment / Reassessment - one wound X- 2 5 Complex Wound Assessment / Reassessment - multiple wounds X- 1 10 Dermatologic / Skin Assessment (not related to wound area) ASSESSMENTS - Focused Assessment _1  - 0 Circumferential Edema Measurements - multi extremities X- 1 10 Nutritional Assessment / Counseling / Intervention _2  - 0 Lower Extremity Assessment (monofilament, tuning fork, pulses) _3  - 0 Peripheral Arterial Disease Assessment (using hand held doppler) ASSESSMENTS - Ostomy and/or Continence Assessment and Care _4  - 0 Incontinence Assessment and Management _5  - 0 Ostomy Care Assessment and Management (repouching, etc.) PROCESS - Coordination of Care _6  - 0 Simple Patient / Family Education for ongoing care X- 1 20 Complex (extensive) Patient / Family Education for ongoing care X- 1 10 Staff obtains Programmer, systems, Records, T Results / Process Orders est _7  - 0 Staff telephones HHA, Nursing Homes / Clarify orders / etc _8  - 0 Routine Transfer to another Facility (non-emergent condition) _9  - 0 Routine Hospital Admission (non-emergent condition) _10  - 0 New Admissions / Biomedical engineer / Ordering NPWT Apligraf, etc. , _11  - 0 Emergency Hospital Admission (emergent condition) _12  - 0 Simple Discharge Coordination X- 1 15 Complex (extensive) Discharge Coordination PROCESS - Special Needs _13  - 0 Pediatric / Minor Patient Management _14  - 0 Isolation Patient Management _15  - 0 Hearing / Language / Visual special needs _16  - 0 Assessment of Community assistance (transportation, D/C planning, etc.) _17  - 0 Additional  assistance / Altered mentation _18  - 0 Support Surface(s) Assessment (bed, cushion, seat, etc.) INTERVENTIONS - Wound Cleansing / Measurement _19  - 0 Simple Wound Cleansing - one wound X- 2 5 Complex  Wound Cleansing - multiple wounds X- 1 5 Wound Imaging (photographs - any number of wounds) _0  - 0 Wound Tracing (instead of photographs) _1  - 0 Simple Wound Measurement - one wound X- 2 5 Complex Wound Measurement - multiple wounds INTERVENTIONS - Wound Dressings X - Small Wound Dressing one or multiple wounds 2 10 _2  - 0 Medium Wound Dressing one or multiple wounds _3  - 0 Large Wound Dressing one or multiple wounds <UYQIHKVQQVZDGLOV>_5<\/IEPPIRJJOACZYSAY>_3  - 0 Application of Medications - topical <KZSWFUXNATFTDDUK>_0<\/URKYHCWCBJSEGBTD>_1  - 0 Application of Medications - injection INTERVENTIONS - Miscellaneous _6  - 0 External ear exam _7  - 0 Specimen Collection (cultures, biopsies, blood, body fluids, etc.) _8  - 0 Specimen(s) / Culture(s) sent or taken to Lab for analysis _9  - 0 Patient Transfer (multiple staff / Civil Service fast streamer / Similar devices) _10  - 0 Simple Staple / Suture removal (25 or less) _11  - 0 Complex Staple / Suture removal (26 or more) _12  - 0 Hypo / Hyperglycemic Management (close monitor of Blood Glucose) _13  - 0 Ankle / Brachial Index (ABI) - do not check if billed separately X- 1 5 Vital Signs Has the patient been seen at the hospital within the last three years: Yes Total Score: 140 Level Of Care: New/Established - Level 4 Electronic Signature(s) Signed: 06/15/2020 6:05:55 PM By: Deon Pilling Entered By: Deon Pilling on 06/15/2020 12:59:29 -------------------------------------------------------------------------------- Encounter Discharge Information Details Patient Name: Date of Service: [place], [name] LD O. 06/15/2020 12:30 PM Medical Record Number: [id number] Patient Account Number: [id number] Date of Birth/Sex: Treating RN: 10/05/1931 (87 y.o. Marcheta Grammes Primary Care Latese Dufault: [name] Other Clinician: Referring  Kloe Oates: Treating Kimari Lienhard/Extender: Alinda Sierras in Treatment: 6 Encounter Discharge Information Items Discharge Condition: Stable Ambulatory Status: Ambulatory Discharge Destination: Home Transportation: Private Auto Schedule Follow-up Appointment: Yes Clinical Summary of Care: Provided on 06/15/2020 Form Type Recipient Paper Patient Patient Electronic Signature(s) Signed: 06/15/2020 1:00:24 PM By: Lorrin Jackson Entered By: Lorrin Jackson on 06/15/2020 13:00:24 -------------------------------------------------------------------------------- Lower Extremity Assessment Details Patient Name: Date of Service: [place], [place] LD O. 06/15/2020 12:30 PM Medical Record Number: [id number] Patient Account Number: [id number] Date of Birth/Sex: Treating RN: 08-24-1931 (71 y.o. [name] Primary Care Pollie Poma: [name] Other Clinician: Referring [name]: Treating Lilith Solana/Extender: Alinda Sierras in Treatment: 6 Electronic Signature(s) Signed: 06/15/2020 6:10:01 PM By: Baruch Gouty RN, BSN Entered By: Baruch Gouty on 06/15/2020 12:44:49 -------------------------------------------------------------------------------- Multi Wound Chart Details Patient Name: Date of Service: [place], [place] LD O. 06/15/2020 12:30 PM Medical Record Number: [id number] Patient Account Number: [id number] Date of Birth/Sex: Treating RN: [birth year] (87 y.o. Burnadette Pop, [name] Primary Care Raylen Ken: [name] Other Clinician: Referring Lachlan Mckim: Treating Illyanna Petillo/Extender: Alinda Sierras in Treatment: 6 Vital Signs Height(in): 68 Pulse(bpm): 22 Weight(lbs): 213 Blood Pressure(mmHg): 167/62 Body Mass Index(BMI): 31 Temperature(F): 97.7 Respiratory Rate(breaths/min): 18 Photos: [1:No Photos Left, Proximal Forearm] [4:No Photos Left Upper Arm] [N/A:N/A N/A] Wound Location: [1:Trauma] [4:Trauma]  [N/A:N/A] Wounding Event: [1:Skin T ear] [4:Skin T ear] [N/A:N/A] Primary Etiology: [1:Anemia, Arrhythmia, Hypertension, Anemia, Arrhythmia, Hypertension, N/A] Comorbid History: [1:Peripheral Venous Disease, Type II Peripheral Venous Disease, Type II Diabetes, Gout, Confinement Anxiety Diabetes, Gout, Confinement Anxiety 04/02/2020] [4:06/04/2020] [N/A:N/A] Date Acquired: [1:6] [4:1] [N/A:N/A] Weeks of Treatment: [1:Open] [4:Open] [N/A:N/A] Wound Status: [1:0.8x0.8x0.1] [4:0.6x1x0.1] [N/A:N/A] Measurements L x W x D (cm) [1:0.503] [4:0.471] [N/A:N/A] A (cm) : rea [1:0.05] [4:0.047] [N/A:N/A] Volume (cm) : [1:93.30%] [4:59.20%] [N/A:N/A] % Reduction in Area: [1:93.30%] [4:59.10%] [N/A:N/A] % Reduction in Volume: [1:Full  Thickness Without Exposed] [4:Full Thickness Without Exposed] [N/A:N/A] Classification: [1:Support Structures Small] [4:Support Structures Small] [N/A:N/A] Exudate Amount: [1:Serosanguineous] [4:Serosanguineous] [N/A:N/A] Exudate Type: [1:red, brown] [4:red, brown] [N/A:N/A] Exudate Color: [1:Flat and Intact] [4:Flat and Intact] [N/A:N/A] Wound Margin: [1:Large (67-100%)] [4:Large (67-100%)] [N/A:N/A] Granulation Amount: [1:Red] [4:Red] [N/A:N/A] Granulation Quality: [1:None Present (0%)] [4:None Present (0%)] [N/A:N/A] Necrotic Amount: [1:Fat Layer (Subcutaneous Tissue): Yes Fat Layer (Subcutaneous Tissue): Yes N/A] Exposed Structures: [1:Fascia: No Tendon: No Muscle: No Joint: No Bone: No Small (1-33%)] [4:Fascia: No Tendon: No Muscle: No Joint: No Bone: No Medium (34-66%)] [N/A:N/A] Treatment Notes Wound #1 (Forearm) Wound Laterality: Left, Proximal Cleanser Soap and Water Discharge Instruction: May shower and wash wound with dial antibacterial soap and water prior to dressing change. Peri-Wound Care Topical Primary Dressing Hydrofera Blue Classic Foam, 2x2 in Discharge Instruction: Moisten with saline prior to applying to wound bed Secondary Dressing T Non-adherent  Dressing, 2x3 in elfa Discharge Instruction: Apply over primary dressing as directed. Secured With Conforming Stretch Gauze Bandage, Sterile 2x75 (in/in) Discharge Instruction: Secure with stretch gauze as directed. 79M Medipore H Soft Cloth Surgical T 4 x 2 (in/yd) ape Discharge Instruction: Secure dressing with tape as directed. Compression Wrap Compression Stockings Add-Ons Wound #4 (Upper Arm) Wound Laterality: Left Cleanser Soap and Water Discharge Instruction: May shower and wash wound with dial antibacterial soap and water prior to dressing change. Peri-Wound Care Topical Primary Dressing Hydrofera Blue Classic Foam, 2x2 in Discharge Instruction: Moisten with saline prior to applying to wound bed Secondary Dressing T Non-adherent Dressing, 2x3 in elfa Discharge Instruction: Apply over primary dressing as directed. Secured With Conforming Stretch Gauze Bandage, Sterile 2x75 (in/in) Discharge Instruction: Secure with stretch gauze as directed. 79M Medipore H Soft Cloth Surgical T 4 x 2 (in/yd) ape Discharge Instruction: Secure dressing with tape as directed. Compression Wrap Compression Stockings Add-Ons Electronic Signature(s) Signed: 06/15/2020 4:29:11 PM By: Linton Ham MD Signed: 06/16/2020 6:22:58 PM By: Rhae Hammock RN Entered By: Linton Ham on 06/15/2020 13:04:30 -------------------------------------------------------------------------------- Multi-Disciplinary Care Plan Details Patient Name: Date of Service: [place], [place] NA LD O. 06/15/2020 12:30 PM Medical Record Number: [id number] Patient Account Number: [id number] Date of Birth/Sex: Treating RN: 1931-05-28 (87 y.o. [name], Meta.Reding Primary Care Tiyonna Sardinha: [name] Other Clinician: Referring Mendell Bontempo: Treating Karena Kinker/Extender: Alinda Sierras in Treatment: 6 Active Inactive Abuse / Safety / Falls / Self Care Management Nursing Diagnoses: History of  Falls Potential for falls Goals: Patient/caregiver will verbalize understanding of skin care regimen Date Initiated: 05/04/2020 Target Resolution Date: 07/02/2020 Goal Status: Active Interventions: Assess self care needs on admission and as needed Provide education on fall prevention Notes: Pain, Acute or Chronic Nursing Diagnoses: Potential alteration in comfort, pain Goals: Patient will verbalize adequate pain control and receive pain control interventions during procedures as needed Date Initiated: 05/04/2020 Target Resolution Date: 07/03/2020 Goal Status: Active Patient/caregiver will verbalize comfort level met Date Initiated: 05/04/2020 Target Resolution Date: 07/02/2020 Goal Status: Active Interventions: Provide education on pain management Reposition patient for comfort Notes: Wound/Skin Impairment Nursing Diagnoses: Knowledge deficit related to ulceration/compromised skin integrity Goals: Patient/caregiver will verbalize understanding of skin care regimen Date Initiated: 05/04/2020 Target Resolution Date: 07/31/2020 Goal Status: Active Interventions: Assess patient/caregiver ability to obtain necessary supplies Assess patient/caregiver ability to perform ulcer/skin care regimen upon admission and as needed Provide education on ulcer and skin care Treatment Activities: Skin care regimen initiated : 05/04/2020 Topical wound management initiated : 05/04/2020 Notes: Electronic Signature(s) Signed: 06/15/2020 6:05:55 PM By: Deon Pilling Entered  By: Deon Pilling on 06/15/2020 12:57:53 -------------------------------------------------------------------------------- Pain Assessment Details Patient Name: Date of Service: [place], [place] LD O. 06/15/2020 12:30 PM Medical Record Number: [id number] Patient Account Number: [id number] Date of Birth/Sex: Treating RN: 1931-09-01 (65 y.o. [name] Primary Care Wendelyn Kiesling: [name] Other Clinician: Referring  Oline Belk: Treating Cailie Bosshart/Extender: Alinda Sierras in Treatment: 6 Active Problems Location of Pain Severity and Description of Pain Patient Has Paino No Site Locations Rate the pain. Rate the pain. Current Pain Level: 0 Pain Management and Medication Current Pain Management: Electronic Signature(s) Signed: 06/15/2020 6:10:01 PM By: Baruch Gouty RN, BSN Entered By: Baruch Gouty on 06/15/2020 12:44:43 -------------------------------------------------------------------------------- Patient/Caregiver Education Details Patient Name: Date of Service: [place] NA LD Jenetta Downer 6/14/2022andnbsp12:30 PM Medical Record Number: [id number] Patient Account Number: [id number] Date of Birth/Gender: Treating RN: Oct 17, 1931 (87 y.o. [name] Primary Care Physician: [name] Other Clinician: Referring Physician: Treating Physician/Extender: Alinda Sierras in Treatment: 6 Education Assessment Education Provided To: Patient Education Topics Provided Wound/Skin Impairment: Handouts: Skin Care Do's and Dont's Methods: Explain/Verbal Responses: Reinforcements needed Electronic Signature(s) Signed: 06/15/2020 6:05:55 PM By: Deon Pilling Entered By: Deon Pilling on 06/15/2020 12:58:04 -------------------------------------------------------------------------------- Wound Assessment Details Patient Name: Date of Service: [place], [place] NA LD O. 06/15/2020 12:30 PM Medical Record Number: [id number] Patient Account Number: [id number] Date of Birth/Sex: Treating RN: Apr 14, 1931 (31 y.o. [name] Primary Care Coretha Creswell: [name] Other Clinician: Referring Jamicia Haaland: Treating Juliani Laduke/Extender: Alinda Sierras in Treatment: 6 Wound Status Wound Number: 1 Primary Skin Tear Etiology: Wound Location: Left, Proximal Forearm Wound Open Wounding Event: Trauma Status: Date Acquired:  04/02/2020 Comorbid Anemia, Arrhythmia, Hypertension, Peripheral Venous Disease, Weeks Of Treatment: 6 History: Type II Diabetes, Gout, Confinement Anxiety Clustered Wound: No Photos Wound Measurements Length: (cm) 0.8 Width: (cm) 0.8 Depth: (cm) 0.1 Area: (cm) 0.503 Volume: (cm) 0.05 % Reduction in Area: 93.3% % Reduction in Volume: 93.3% Epithelialization: Small (1-33%) Tunneling: No Wound Description Classification: Full Thickness Without Exposed Support Structures Wound Margin: Flat and Intact Exudate Amount: Small Exudate Type: Serosanguineous Exudate Color: red, brown Foul Odor After Cleansing: No Slough/Fibrino No Wound Bed Granulation Amount: Large (67-100%) Exposed Structure Granulation Quality: Red Fascia Exposed: No Necrotic Amount: None Present (0%) Fat Layer (Subcutaneous Tissue) Exposed: Yes Tendon Exposed: No Muscle Exposed: No Joint Exposed: No Bone Exposed: No Treatment Notes Wound #1 (Forearm) Wound Laterality: Left, Proximal Cleanser Soap and Water Discharge Instruction: May shower and wash wound with dial antibacterial soap and water prior to dressing change. Peri-Wound Care Topical Primary Dressing Hydrofera Blue Classic Foam, 2x2 in Discharge Instruction: Moisten with saline prior to applying to wound bed Secondary Dressing T Non-adherent Dressing, 2x3 in elfa Discharge Instruction: Apply over primary dressing as directed. Secured With Conforming Stretch Gauze Bandage, Sterile 2x75 (in/in) Discharge Instruction: Secure with stretch gauze as directed. 47M Medipore H Soft Cloth Surgical T 4 x 2 (in/yd) ape Discharge Instruction: Secure dressing with tape as directed. Compression Wrap Compression Stockings Add-Ons Electronic Signature(s) Signed: 06/15/2020 4:42:14 PM By: Sandre Kitty Signed: 06/15/2020 6:10:01 PM By: Baruch Gouty RN, BSN Entered By: Sandre Kitty on 06/15/2020  16:23:35 -------------------------------------------------------------------------------- Wound Assessment Details Patient Name: Date of Service: [place], [place] NA LD O. 06/15/2020 12:30 PM Medical Record Number: [id number] Patient Account Number: [id number] Date of Birth/Sex: Treating RN: Nov 15, 1931 (89 y.o. [name] Primary Care Nayel Purdy: [name] Other Clinician: Referring Greogry Goodwyn: Treating Aneisha Skyles/Extender: Alinda Sierras in Treatment: 6 Wound Status Wound  Number: 4 Primary Skin Tear Etiology: Wound Location: Left Upper Arm Wound Open Wounding Event: Trauma Status: Date Acquired: 06/04/2020 Comorbid Anemia, Arrhythmia, Hypertension, Peripheral Venous Disease, Weeks Of Treatment: 1 History: Type II Diabetes, Gout, Confinement Anxiety Clustered Wound: No Photos Wound Measurements Length: (cm) 0.6 Width: (cm) 1 Depth: (cm) 0.1 Area: (cm) 0.471 Volume: (cm) 0.047 % Reduction in Area: 59.2% % Reduction in Volume: 59.1% Epithelialization: Medium (34-66%) Tunneling: No Undermining: No Wound Description Classification: Full Thickness Without Exposed Support Structures Wound Margin: Flat and Intact Exudate Amount: Small Exudate Type: Serosanguineous Exudate Color: red, brown Foul Odor After Cleansing: No Slough/Fibrino No Wound Bed Granulation Amount: Large (67-100%) Exposed Structure Granulation Quality: Red Fascia Exposed: No Necrotic Amount: None Present (0%) Fat Layer (Subcutaneous Tissue) Exposed: Yes Tendon Exposed: No Muscle Exposed: No Joint Exposed: No Bone Exposed: No Treatment Notes Wound #4 (Upper Arm) Wound Laterality: Left Cleanser Soap and Water Discharge Instruction: May shower and wash wound with dial antibacterial soap and water prior to dressing change. Peri-Wound Care Topical Primary Dressing Hydrofera Blue Classic Foam, 2x2 in Discharge Instruction: Moisten with saline prior to applying to wound  bed Secondary Dressing T Non-adherent Dressing, 2x3 in elfa Discharge Instruction: Apply over primary dressing as directed. Secured With Conforming Stretch Gauze Bandage, Sterile 2x75 (in/in) Discharge Instruction: Secure with stretch gauze as directed. 54M Medipore H Soft Cloth Surgical T 4 x 2 (in/yd) ape Discharge Instruction: Secure dressing with tape as directed. Compression Wrap Compression Stockings Add-Ons Electronic Signature(s) Signed: 06/15/2020 4:42:14 PM By: Sandre Kitty Signed: 06/15/2020 6:10:01 PM By: Baruch Gouty RN, BSN Entered By: Sandre Kitty on 06/15/2020 16:24:10 -------------------------------------------------------------------------------- Vitals Details Patient Name: Date of Service: [place], [name] LD O. 06/15/2020 12:30 PM Medical Record Number: [id number] Patient Account Number: [id number] Date of Birth/Sex: Treating RN: [birth year] (27 y.o. [name] Primary Care Ravneet Spilker: [name] Other Clinician: Referring Kazuma Elena: Treating Geneve Kimpel/Extender: Alinda Sierras in Treatment: 6 Vital Signs Time Taken: 12:44 Temperature (F): 97.7 Height (in): 69 Pulse (bpm): 76 Source: Stated Respiratory Rate (breaths/min): 18 Weight (lbs): 213 Blood Pressure (mmHg): 167/62 Source: Stated Reference Range: 80 - 120 mg / dl Body Mass Index (BMI): 31.5 Electronic Signature(s) Signed: 06/15/2020 6:10:01 PM By: Baruch Gouty RN, BSN Entered By: Baruch Gouty on 06/15/2020 12:44:35

## 2020-06-18 ENCOUNTER — Ambulatory Visit (INDEPENDENT_AMBULATORY_CARE_PROVIDER_SITE_OTHER): Payer: Medicare HMO | Admitting: Family Medicine

## 2020-06-18 ENCOUNTER — Other Ambulatory Visit: Payer: Self-pay

## 2020-06-18 ENCOUNTER — Encounter: Payer: Self-pay | Admitting: Family Medicine

## 2020-06-18 VITALS — BP 118/70 | HR 59 | Temp 98.0°F | Resp 20 | Ht 71.0 in | Wt 234.2 lb

## 2020-06-18 DIAGNOSIS — H259 Unspecified age-related cataract: Secondary | ICD-10-CM | POA: Insufficient documentation

## 2020-06-18 DIAGNOSIS — N5089 Other specified disorders of the male genital organs: Secondary | ICD-10-CM | POA: Diagnosis not present

## 2020-06-18 DIAGNOSIS — Z125 Encounter for screening for malignant neoplasm of prostate: Secondary | ICD-10-CM

## 2020-06-18 DIAGNOSIS — E119 Type 2 diabetes mellitus without complications: Secondary | ICD-10-CM | POA: Diagnosis not present

## 2020-06-18 DIAGNOSIS — Z0000 Encounter for general adult medical examination without abnormal findings: Secondary | ICD-10-CM

## 2020-06-18 DIAGNOSIS — I4891 Unspecified atrial fibrillation: Secondary | ICD-10-CM

## 2020-06-18 DIAGNOSIS — R634 Abnormal weight loss: Secondary | ICD-10-CM | POA: Insufficient documentation

## 2020-06-18 LAB — LIPID PANEL
Cholesterol: 96 mg/dL (ref 0–200)
HDL: 33.5 mg/dL — ABNORMAL LOW (ref >39.00)
LDL Cholesterol: 54 mg/dL (ref 0–99)
NonHDL: 62.91
Total CHOL/HDL Ratio: 3
Triglycerides: 46 mg/dL (ref 0.0–149.0)
VLDL: 9.2 mg/dL (ref 0.0–40.0)

## 2020-06-18 LAB — CBC WITH DIFFERENTIAL/PLATELET
Basophils Absolute: 0 10*3/uL (ref 0.0–0.1)
Basophils Relative: 0.9 % (ref 0.0–3.0)
Eosinophils Absolute: 0.1 10*3/uL (ref 0.0–0.7)
Eosinophils Relative: 2.9 % (ref 0.0–5.0)
HCT: 33.9 % — ABNORMAL LOW (ref 39.0–52.0)
Hemoglobin: 11 g/dL — ABNORMAL LOW (ref 13.0–17.0)
Lymphocytes Relative: 20.1 % (ref 12.0–46.0)
Lymphs Abs: 1 10*3/uL (ref 0.7–4.0)
MCHC: 32.5 g/dL (ref 30.0–36.0)
MCV: 94.2 fl (ref 78.0–100.0)
Monocytes Absolute: 0.8 10*3/uL (ref 0.1–1.0)
Monocytes Relative: 15.8 % — ABNORMAL HIGH (ref 3.0–12.0)
Neutro Abs: 3 10*3/uL (ref 1.4–7.7)
Neutrophils Relative %: 60.3 % (ref 43.0–77.0)
Platelets: 227 10*3/uL (ref 150.0–400.0)
RBC: 3.6 Mil/uL — ABNORMAL LOW (ref 4.22–5.81)
RDW: 17.2 % — ABNORMAL HIGH (ref 11.5–15.5)
WBC: 5 10*3/uL (ref 4.0–10.5)

## 2020-06-18 LAB — BASIC METABOLIC PANEL
BUN: 19 mg/dL (ref 6–23)
CO2: 30 mEq/L (ref 19–32)
Calcium: 8.8 mg/dL (ref 8.4–10.5)
Chloride: 104 mEq/L (ref 96–112)
Creatinine, Ser: 1.57 mg/dL — ABNORMAL HIGH (ref 0.40–1.50)
GFR: 39.46 mL/min — ABNORMAL LOW (ref >60.00)
Glucose, Bld: 87 mg/dL (ref 70–99)
Potassium: 4.1 mEq/L (ref 3.5–5.1)
Sodium: 139 mEq/L (ref 135–145)

## 2020-06-18 LAB — HEPATIC FUNCTION PANEL
ALT: 8 U/L (ref 0–53)
AST: 20 U/L (ref 0–37)
Albumin: 3.2 g/dL — ABNORMAL LOW (ref 3.5–5.2)
Alkaline Phosphatase: 105 U/L (ref 39–117)
Bilirubin, Direct: 0.6 mg/dL — ABNORMAL HIGH (ref 0.0–0.3)
Total Bilirubin: 1.5 mg/dL — ABNORMAL HIGH (ref 0.2–1.2)
Total Protein: 5.9 g/dL — ABNORMAL LOW (ref 6.0–8.3)

## 2020-06-18 LAB — TSH: TSH: 1.63 u[IU]/mL (ref 0.35–4.50)

## 2020-06-18 LAB — PSA, MEDICARE: PSA: 4.81 ng/ml — ABNORMAL HIGH (ref 0.10–4.00)

## 2020-06-18 LAB — HEMOGLOBIN A1C: Hgb A1c MFr Bld: 6 % (ref 4.6–6.5)

## 2020-06-18 MED ORDER — TORSEMIDE 20 MG PO TABS: 20.0000 mg | ORAL_TABLET | Freq: Every day | ORAL | 3 refills | Status: DC

## 2020-06-18 NOTE — Patient Instructions (Signed)
Follow up in 6 months to recheck diabetes, BP, and cholesterol We'll notify you of your lab results and make any changes if needed Continue to make healthy food choices STOP the Furosemide START the Torsemide once daily We'll call you with your urology appt Call with any questions or concerns Stay Safe!  Stay Healthy! Have a great summer!!

## 2020-06-18 NOTE — Assessment & Plan Note (Signed)
Pt is on coumadin and seeing the Coumadin clinic for dosing.  Currently asymptomatic w/ exception of leg swelling.

## 2020-06-18 NOTE — Assessment & Plan Note (Signed)
Chronic problem.  UTD on eye exam, foot exam done today.  On ACE for renal protection.  On Metformin w/o difficulty.  Check labs.  Adjust meds prn

## 2020-06-18 NOTE — Assessment & Plan Note (Signed)
Pt's PE unchanged from previous and WNL w/ exception of Afib and LE edema.  UTD on immunizations, colon cancer screen.  Check labs.  Anticipatory guidance provided.

## 2020-06-18 NOTE — Progress Notes (Signed)
   Subjective:    Patient ID: [name], male    DOB: [birth year], 87 y.o.   MRN: [id number]  HPI CPE- UTD on colon cancer screen, due for foot exam, eye exam.  UTD on pneumonia vaccines, COVID vaccines.  Reviewed past medical, surgical, family and social histories.   Patient Care Team    Relationship Specialty Notifications Start End  Midge Minium, MD PCP - General Family Medicine  03/31/15   Elza Rafter, MD Consulting Physician Family Medicine  10/14/13    Comment: Vein MD  Armbruster, Carlota Raspberry, MD Consulting Physician Gastroenterology  06/28/17   [facility]  Internal Medicine  06/28/17    Comment: Provider at [facility] Irena Reichmann, [facility] Pharmacist Pharmacist Admissions 04/16/19    Comment: PHONE NUMBER [phone]    Health Maintenance  Topic Date Due   Zoster Vaccines- Shingrix (1 of 2) Never done   OPHTHALMOLOGY EXAM  03/03/2018   HEMOGLOBIN A1C  02/01/2020   FOOT EXAM  02/24/2020   COVID-19 Vaccine (3 - Booster for Pfizer series) 07/04/2020 (Originally 09/22/2019)   TETANUS/TDAP  02/27/2024   PNA vac Low Risk Adult  Completed   HPV VACCINES  Aged Out      Review of Systems Patient reports no vision/hearing changes, anorexia, fever ,adenopathy, persistant/recurrent hoarseness, swallowing issues, chest pain, palpitations, edema, persistant/recurrent cough, hemoptysis, gastrointestinal  bleeding (melena, rectal bleeding), abdominal pain, excessive heart burn, GU symptoms (dysuria, hematuria, voiding/incontinence issues) syncope, focal weakness, memory loss, numbness & tingling, skin/hair/nail changes, depression, anxiety, musculoskeletal symptoms/signs.   LE edema- pt reports lasix is not effective in treating his swelling. Testicular swelling- 'they are the size of oranges', swelling for at least a year.  Denies pain, 'it's just a problem'. + SOB w/ minimal exertion + bruising due to coumadin  This visit occurred during the SARS-CoV-2 public health emergency.   Safety protocols were in place, including screening questions prior to the visit, additional usage of staff PPE, and extensive cleaning of exam room while observing appropriate contact time as indicated for disinfecting solutions.      Objective:   Physical Exam General Appearance:    Alert, cooperative, no distress, appears stated age, frail appearing  Head:    Normocephalic, without obvious abnormality, atraumatic  Eyes:    PERRL, conjunctiva/corneas clear, EOM's intact  Ears:    Normal TM's and external ear canals, both ears  Nose:   Deferred due to COVID  Throat:   Neck:   Supple, symmetrical, trachea midline, no adenopathy;       thyroid:  No enlargement/tenderness/nodules  Back:     Symmetric, no curvature, ROM normal, no CVA tenderness  Lungs:     Clear to auscultation bilaterally, respirations unlabored  Chest wall:    No tenderness or deformity  Heart:    Irregular S1 and S2   Abdomen:     Soft, non-tender, bowel sounds active all four quadrants,    no masses, no organomegaly  Genitalia:    Deferred to urology  Rectal:    Extremities:   Extremities normal, atraumatic, no cyanosis, 1-2+ LE edema bilaterally  Pulses:   2+ and symmetric all extremities  Skin:   Scattered bruising of arms  Lymph nodes:   Cervical, supraclavicular, and axillary nodes normal  Neurologic:   CNII-XII intact. Normal strength, sensation and reflexes      throughout          Assessment & Plan:

## 2020-06-26 ENCOUNTER — Other Ambulatory Visit: Payer: Self-pay | Admitting: Family Medicine

## 2020-06-29 ENCOUNTER — Other Ambulatory Visit: Payer: Self-pay

## 2020-06-29 ENCOUNTER — Encounter (HOSPITAL_BASED_OUTPATIENT_CLINIC_OR_DEPARTMENT_OTHER): Payer: Medicare HMO | Admitting: Internal Medicine

## 2020-06-29 DIAGNOSIS — E119 Type 2 diabetes mellitus without complications: Secondary | ICD-10-CM

## 2020-06-29 DIAGNOSIS — E1122 Type 2 diabetes mellitus with diabetic chronic kidney disease: Secondary | ICD-10-CM | POA: Diagnosis not present

## 2020-06-29 DIAGNOSIS — E11622 Type 2 diabetes mellitus with other skin ulcer: Secondary | ICD-10-CM | POA: Diagnosis not present

## 2020-06-29 DIAGNOSIS — Z7901 Long term (current) use of anticoagulants: Secondary | ICD-10-CM | POA: Diagnosis not present

## 2020-06-29 DIAGNOSIS — I872 Venous insufficiency (chronic) (peripheral): Secondary | ICD-10-CM | POA: Diagnosis not present

## 2020-06-29 DIAGNOSIS — S51802D Unspecified open wound of left forearm, subsequent encounter: Secondary | ICD-10-CM

## 2020-06-29 DIAGNOSIS — S41102D Unspecified open wound of left upper arm, subsequent encounter: Secondary | ICD-10-CM

## 2020-06-29 DIAGNOSIS — I129 Hypertensive chronic kidney disease with stage 1 through stage 4 chronic kidney disease, or unspecified chronic kidney disease: Secondary | ICD-10-CM | POA: Diagnosis not present

## 2020-06-29 DIAGNOSIS — N182 Chronic kidney disease, stage 2 (mild): Secondary | ICD-10-CM | POA: Diagnosis not present

## 2020-06-29 DIAGNOSIS — Z833 Family history of diabetes mellitus: Secondary | ICD-10-CM | POA: Diagnosis not present

## 2020-06-29 DIAGNOSIS — E1151 Type 2 diabetes mellitus with diabetic peripheral angiopathy without gangrene: Secondary | ICD-10-CM | POA: Diagnosis not present

## 2020-06-29 NOTE — Progress Notes (Signed)
ROBEY, MASSMANN ([id number]) Visit Report for 06/29/2020 Chief Complaint Document Details Patient Name: Date of Service: [name], [name] [place] LD O. 06/29/2020 12:30 PM Medical Record Number: [id number] Patient Account Number: [id number] Date of Birth/Sex: Treating RN: 1931-05-28 (87 y.o. [name] Primary Care Provider: [name] Other Clinician: Referring Provider: Treating Provider/Extender: Judeth Porch in Treatment: 8 Information Obtained from: Patient Chief Complaint Wounds to the left forearm Electronic Signature(s) Signed: 06/29/2020 1:34:43 PM By: Kalman Shan DO Entered By: Kalman Shan on 06/29/2020 13:29:06 -------------------------------------------------------------------------------- HPI Details Patient Name: Date of Service: Tamala Julian, [name] LD O. 06/29/2020 12:30 PM Medical Record Number: [id number] Patient Account Number: [id number] Date of Birth/Sex: Treating RN: 12-Oct-1931 (87 y.o. [name] Primary Care Provider: [name] Other Clinician: Referring Provider: Treating Provider/Extender: Judeth Porch in Treatment: 8 History of Present Illness HPI Description: Admission 5/3 [name] is an 87 year old male with a past medical history of essential hypertension, type 2 diabetes on oral agents, atrial fibrillation on warfarin, and chronic venous insufficiency That presents today for 3 wounds. 2 are located to his left forearm and one is on the right wrist. He developed the most distal wound to his left forearm 1 month ago when he was reaching behind the headboard and developed a skin tear. This has been healing well for the past month. The other 2 wounds were developed after removing a Band-Aid from the area. These are also limited to skin tears. These developed about 1 week ago. He has been using a T pad and Band-Aid to the wounds. He overall feels well denies any signs of  infection. elfa 5/10; patient presents for 1 week follow-up. He has been using collagen to the 3 wounds. 2 of the wounds have closed. He has no complaints or issues today. He denies acute signs of infection. 5/24; patient presents for 2-week follow-up. He has been doing dressing changes with collagen. He has no complaints today or issues. He denies signs of infection. 6/7; the patient arrives in clinic with nothing open on his right arm. He still has the open area on the left arm but has a new area on the left posterior humerus just above the olecranon. Again he relates this to I think making the bed and trauma on the headboard. He has been using silver collagen to his wounds 6/14; he still has small open areas on the anterior left forearm and the posterior left humerus we have been using Hydrofera Blue. The wounds measure smaller 6/28; patient presents for 2-week follow-up. He had open areas to the left forearm and posterior left humerus and has been using Hydrofera Blue. He reports that the wounds have healed. Electronic Signature(s) Signed: 06/29/2020 1:34:43 PM By: Kalman Shan DO Entered By: Kalman Shan on 06/29/2020 13:30:24 -------------------------------------------------------------------------------- Physical Exam Details Patient Name: Date of Service: [place], [name] LD O. 06/29/2020 12:30 PM Medical Record Number: [id number] Patient Account Number: [id number] Date of Birth/Sex: Treating RN: 06-18-1931 (87 y.o. [name] Primary Care Provider: [name] Other Clinician: Referring Provider: Treating Provider/Extender: Judeth Porch in Treatment: 8 Constitutional respirations regular, non-labored and within target range for patient.Marland Kitchen Psychiatric pleasant and cooperative. Notes Epithelialization to the Previous left upper extremity wounds. Surrounding skin intact. No signs of infection. Electronic Signature(s) Signed: 06/29/2020  1:34:43 PM By: Kalman Shan DO Entered By: Kalman Shan on 06/29/2020 13:31:49 -------------------------------------------------------------------------------- Physician Orders Details Patient Name: Date of Service: Tamala Julian, [name] LD O. 06/29/2020 12:30  PM Medical Record Number: [id number] Patient Account Number: [id number] Date of Birth/Sex: Treating RN: Jul 01, 1931 (87 y.o. [name] Primary Care Provider: [name] Other Clinician: Referring Provider: Treating Provider/Extender: Judeth Porch in Treatment: 8 Verbal / Phone Orders: No Diagnosis Coding ICD-10 Coding Code Description S51.802D Unspecified open wound of left forearm, subsequent encounter S41.102D Unspecified open wound of left upper arm, subsequent encounter I10 Essential (primary) hypertension I87.2 Venous insufficiency (chronic) (peripheral) E11.9 Type 2 diabetes mellitus without complications B51.02 Unspecified atrial fibrillation Discharge From [facility] Discharge from [place] - Call if any future wound care needs. Apply lotion to closed areas. Electronic Signature(s) Signed: 06/29/2020 1:34:43 PM By: Kalman Shan DO Entered By: Kalman Shan on 06/29/2020 13:32:27 -------------------------------------------------------------------------------- Problem List Details Patient Name: Date of Service: [place], [name] LD O. 06/29/2020 12:30 PM Medical Record Number: [id number] Patient Account Number: [id number] Date of Birth/Sex: Treating RN: October 12, 1931 (87 y.o. [name] Primary Care Provider: [name] Other Clinician: Referring Provider: Treating Provider/Extender: Judeth Porch in Treatment: 8 Active Problems ICD-10 Encounter Code Description Active Date MDM Diagnosis S51.802D Unspecified open wound of left forearm, subsequent encounter 05/11/2020 No Yes S41.102D Unspecified open wound of left upper arm,  subsequent encounter 06/08/2020 No Yes I10 Essential (primary) hypertension 05/04/2020 No Yes I87.2 Venous insufficiency (chronic) (peripheral) 05/04/2020 No Yes E11.9 Type 2 diabetes mellitus without complications 02/05/5359 No Yes I48.91 Unspecified atrial fibrillation 05/04/2020 No Yes Inactive Problems ICD-10 Code Description Active Date Inactive Date S51.802A Unspecified open wound of left forearm, initial encounter 05/04/2020 05/04/2020 S51.801A Unspecified open wound of right forearm, initial encounter 05/04/2020 05/04/2020 Resolved Problems ICD-10 Code Description Active Date Resolved Date S51.801D Unspecified open wound of right forearm, subsequent encounter 05/11/2020 05/11/2020 Electronic Signature(s) Signed: 06/29/2020 1:34:43 PM By: Kalman Shan DO Entered By: Kalman Shan on 06/29/2020 13:28:30 -------------------------------------------------------------------------------- Progress Note Details Patient Name: Date of Service: Tamala Julian, [name] LD O. 06/29/2020 12:30 PM Medical Record Number: [id number] Patient Account Number: [id number] Date of Birth/Sex: Treating RN: Jan 10, 1931 (87 y.o. [name] Primary Care Provider: [name] Other Clinician: Referring Provider: Treating Provider/Extender: Judeth Porch in Treatment: 8 Subjective Chief Complaint Information obtained from Patient Wounds to the left forearm History of Present Illness (HPI) Admission 5/3 [name] is an 87 year old male with a past medical history of essential hypertension, type 2 diabetes on oral agents, atrial fibrillation on warfarin, and chronic venous insufficiency That presents today for 3 wounds. 2 are located to his left forearm and one is on the right wrist. He developed the most distal wound to his left forearm 1 month ago when he was reaching behind the headboard and developed a skin tear. This has been healing well for the past month. The other 2 wounds were  developed after removing a Band-Aid from the area. These are also limited to skin tears. These developed about 1 week ago. He has been using a T pad and Band-Aid to the wounds. He overall feels well denies any signs of infection. elfa 5/10; patient presents for 1 week follow-up. He has been using collagen to the 3 wounds. 2 of the wounds have closed. He has no complaints or issues today. He denies acute signs of infection. 5/24; patient presents for 2-week follow-up. He has been doing dressing changes with collagen. He has no complaints today or issues. He denies signs of infection. 6/7; the patient arrives in clinic with nothing open on his right arm. He still  has the open area on the left arm but has a new area on the left posterior humerus just above the olecranon. Again he relates this to I think making the bed and trauma on the headboard. He has been using silver collagen to his wounds 6/14; he still has small open areas on the anterior left forearm and the posterior left humerus we have been using Hydrofera Blue. The wounds measure smaller 6/28; patient presents for 2-week follow-up. He had open areas to the left forearm and posterior left humerus and has been using Hydrofera Blue. He reports that the wounds have healed. Patient History Information obtained from Patient. Family History Diabetes - Father, Heart Disease - Father, Hypertension - Father, Stroke - Father, No family history of Cancer, Hereditary Spherocytosis, Kidney Disease, Lung Disease, Seizures, Thyroid Problems, Tuberculosis. Social History Former smoker - quit at 87 years old, Marital Status - Married, Alcohol Use - Never, Drug Use - No History, Caffeine Use - Rarely. Medical History Eyes Denies history of Cataracts, Glaucoma, Optic Neuritis Ear/Nose/Mouth/Throat Denies history of Chronic sinus problems/congestion, Middle ear problems Hematologic/Lymphatic Patient has history of Anemia Denies history of Hemophilia,  Human Immunodeficiency Virus, Lymphedema, Sickle Cell Disease Respiratory Denies history of Aspiration, Asthma, Chronic Obstructive Pulmonary Disease (COPD), Pneumothorax, Sleep Apnea, Tuberculosis Cardiovascular Patient has history of Arrhythmia - A.fib, Hypertension, Peripheral Venous Disease Gastrointestinal Denies history of Cirrhosis , Colitis, Crohnoos, Hepatitis A, Hepatitis B, Hepatitis C Endocrine Patient has history of Type II Diabetes Immunological Denies history of Lupus Erythematosus, Raynaudoos, Scleroderma Integumentary (Skin) Denies history of History of Burn Musculoskeletal Patient has history of Gout Denies history of Rheumatoid Arthritis, Osteoarthritis, Osteomyelitis Neurologic Denies history of Dementia, Neuropathy, Quadriplegia, Paraplegia, Seizure Disorder Oncologic Denies history of Received Chemotherapy, Received Radiation Psychiatric Patient has history of Confinement Anxiety Denies history of Anorexia/bulimia Hospitalization/Surgery History - 2017 GI bleed. Medical A Surgical History Notes nd Cardiovascular hyperlipidemia Gastrointestinal chronic constipation Genitourinary Stage II kidney Disease Musculoskeletal osteopenia Objective Constitutional respirations regular, non-labored and within target range for patient.. Vitals Time Taken: 12:46 PM, Height: 69 in, Source: Stated, Weight: 213 lbs, Source: Stated, BMI: 31.5, Temperature: 97.9 F, Pulse: 73 bpm, Respiratory Rate: 18 breaths/min, Blood Pressure: 137/63 mmHg. Psychiatric pleasant and cooperative. General Notes: Epithelialization to the Previous left upper extremity wounds. Surrounding skin intact. No signs of infection. Integumentary (Hair, Skin) Wound #1 status is Open. Original cause of wound was Trauma. The date acquired was: 04/02/2020. The wound has been in treatment 8 weeks. The wound is located on the Left,Proximal Forearm. The wound measures 0cm length x 0cm width x 0cm depth;  0cm^2 area and 0cm^3 volume. There is no tunneling or undermining noted. There is a none present amount of drainage noted. There is no granulation within the wound bed. There is no necrotic tissue within the wound bed. Wound #4 status is Healed - Epithelialized. Original cause of wound was Trauma. The date acquired was: 06/04/2020. The wound has been in treatment 3 weeks. The wound is located on the Left Upper Arm. The wound measures 0cm length x 0cm width x 0cm depth; 0cm^2 area and 0cm^3 volume. There is no tunneling or undermining noted. There is a none present amount of drainage noted. There is no granulation within the wound bed. There is no necrotic tissue within the wound bed. Assessment Active Problems ICD-10 Unspecified open wound of left forearm, subsequent encounter Unspecified open wound of left upper arm, subsequent encounter Essential (primary) hypertension Venous insufficiency (chronic) (peripheral) Type 2 diabetes mellitus  without complications Unspecified atrial fibrillation Patient has done well with Hydrofera Blue and all wounds are closed. I asked that he use lotion to the previous wound sites to keep the area from becoming to dry. He knows to call us with any questions or concerns and can follow-up as needed. Plan Discharge From [facility] Services: Discharge from [place] - Call if any future wound care needs. Apply lotion to closed areas. 1. Discharge from our clinic due to closed wounds 2. Follow-up as needed Electronic Signature(s) Signed: 06/29/2020 1:34:43 PM By: Kalman Shan DO Entered By: Kalman Shan on 06/29/2020 13:33:49 -------------------------------------------------------------------------------- HxROS Details Patient Name: Date of Service: Tamala Julian, [name] LD O. 06/29/2020 12:30 PM Medical Record Number: [id number] Patient Account Number: [id number] Date of Birth/Sex: Treating RN: [birth year] (87 y.o. [name] Primary Care Provider: Other  Clinician: [name] Referring Provider: Treating Provider/Extender: Judeth Porch in Treatment: 8 Information Obtained From Patient Eyes Medical History: Negative for: Cataracts; Glaucoma; Optic Neuritis Ear/Nose/Mouth/Throat Medical History: Negative for: Chronic sinus problems/congestion; Middle ear problems Hematologic/Lymphatic Medical History: Positive for: Anemia Negative for: Hemophilia; Human Immunodeficiency Virus; Lymphedema; Sickle Cell Disease Respiratory Medical History: Negative for: Aspiration; Asthma; Chronic Obstructive Pulmonary Disease (COPD); Pneumothorax; Sleep Apnea; Tuberculosis Cardiovascular Medical History: Positive for: Arrhythmia - A.fib; Hypertension; Peripheral Venous Disease Past Medical History Notes: hyperlipidemia Gastrointestinal Medical History: Negative for: Cirrhosis ; Colitis; Crohns; Hepatitis A; Hepatitis B; Hepatitis C Past Medical History Notes: chronic constipation Endocrine Medical History: Positive for: Type II Diabetes Time with diabetes: 15 years ago Treated with: Diet Blood sugar tested every day: No Genitourinary Medical History: Past Medical History Notes: Stage II kidney Disease Immunological Medical History: Negative for: Lupus Erythematosus; Raynauds; Scleroderma Integumentary (Skin) Medical History: Negative for: History of Burn Musculoskeletal Medical History: Positive for: Gout Negative for: Rheumatoid Arthritis; Osteoarthritis; Osteomyelitis Past Medical History Notes: osteopenia Neurologic Medical History: Negative for: Dementia; Neuropathy; Quadriplegia; Paraplegia; Seizure Disorder Oncologic Medical History: Negative for: Received Chemotherapy; Received Radiation Psychiatric Medical History: Positive for: Confinement Anxiety Negative for: Anorexia/bulimia Immunizations Pneumococcal Vaccine: Received Pneumococcal Vaccination: No Implantable Devices No  devices added Hospitalization / Surgery History Type of Hospitalization/Surgery 2017 GI bleed Family and Social History Cancer: No; Diabetes: Yes - Father; Heart Disease: Yes - Father; Hereditary Spherocytosis: No; Hypertension: Yes - Father; Kidney Disease: No; Lung Disease: No; Seizures: No; Stroke: Yes - Father; Thyroid Problems: No; Tuberculosis: No; Former smoker - quit at 87 years old; Marital Status - Married; Alcohol Use: Never; Drug Use: No History; Caffeine Use: Rarely; Financial Concerns: No; Food, Clothing or Shelter Needs: No; Support System Lacking: No; Transportation Concerns: No Electronic Signature(s) Signed: 06/29/2020 1:34:43 PM By: Kalman Shan DO Signed: 06/29/2020 4:38:16 PM By: Deon Pilling Entered By: Kalman Shan on 06/29/2020 13:30:38 -------------------------------------------------------------------------------- SuperBill Details Patient Name: Date of Service: [place], [place] NA LD O. 06/29/2020 Medical Record Number: [id number] Patient Account Number: [id number] Date of Birth/Sex: Treating RN: November 16, 1931 (87 y.o. [name] Primary Care Provider: [name] Other Clinician: Referring Provider: Treating Provider/Extender: Judeth Porch in Treatment: 8 Diagnosis Coding ICD-10 Codes Code Description [phone] Unspecified open wound of left forearm, subsequent encounter S41.102D Unspecified open wound of left upper arm, subsequent encounter I10 Essential (primary) hypertension I87.2 Venous insufficiency (chronic) (peripheral) E11.9 Type 2 diabetes mellitus without complications X73.53 Unspecified atrial fibrillation Facility Procedures CPT4 Code: [id number] Description: 99214 - WOUND CARE VISIT-LEV 4 EST PT Modifier: Quantity: 1 Physician Procedures : CPT4 Code Description Modifier [id number] -  WC PHYS LEVEL 3 - EST PT ICD-10 Diagnosis Description S51.802D Unspecified open wound of left forearm, subsequent  encounter S41.102D Unspecified open wound of left upper arm, subsequent encounter E11.9  Type 2 diabetes mellitus without complications Y72.1 Venous insufficiency (chronic) (peripheral) Quantity: 1 Electronic Signature(s) Signed: 06/29/2020 1:34:43 PM By: Kalman Shan DO Entered By: Kalman Shan on 06/29/2020 13:34:12

## 2020-06-29 NOTE — Progress Notes (Signed)
JARVIS, SAWA ([id number]) Visit Report for 06/29/2020 Arrival Information Details Patient Name: Date of Service: [place], [place] LD O. 06/29/2020 12:30 PM Medical Record Number: [id number] Patient Account Number: [id number] Date of Birth/Sex: Treating RN: [birth year] (87 y.o. [name] Primary Care Gilberte Gorley: [name] Other Clinician: Referring Allante Beane: Treating Draya Felker/Extender: Judeth Porch in Treatment: 8 Visit Information History Since Last Visit Added or deleted any medications: No Patient Arrived: Ambulatory Any new allergies or adverse reactions: No Arrival Time: 12:39 Had a fall or experienced change in No Accompanied By: self activities of daily living that may affect Transfer Assistance: None risk of falls: Patient Identification Verified: Yes Signs or symptoms of abuse/neglect since last visito No Secondary Verification Process Completed: Yes Hospitalized since last visit: No Patient Requires Transmission-Based Precautions: No Implantable device outside of the clinic excluding No Patient Has Alerts: Yes cellular tissue based products placed in the center Patient Alerts: Patient on Blood Thinner since last visit: Has Dressing in Place as Prescribed: No Pain Present Now: No Electronic Signature(s) Signed: 06/29/2020 4:32:22 PM By: Baruch Gouty RN, BSN Entered By: Baruch Gouty on 06/29/2020 12:46:11 -------------------------------------------------------------------------------- Clinic Level of Care Assessment Details Patient Name: Date of Service: [employer], [place] LD O. 06/29/2020 12:30 PM Medical Record Number: [id number] Patient Account Number: [id number] Date of Birth/Sex: Treating RN: 1931-03-10 (87 y.o. [name], Meta.Reding Primary Care Karson Reede: [name] Other Clinician: Referring Ahmari Duerson: Treating Jaleiyah Alas/Extender: Judeth Porch in Treatment: 8 Clinic Level of Care Assessment  Items TOOL 4 Quantity Score X- 1 0 Use when only an EandM is performed on FOLLOW-UP visit ASSESSMENTS - Nursing Assessment / Reassessment X- 1 10 Reassessment of Co-morbidities (includes updates in patient status) X- 1 5 Reassessment of Adherence to Treatment Plan ASSESSMENTS - Wound and Skin A ssessment / Reassessment []  - 0 Simple Wound Assessment / Reassessment - one wound X- 2 5 Complex Wound Assessment / Reassessment - multiple wounds X- 1 10 Dermatologic / Skin Assessment (not related to wound area) ASSESSMENTS - Focused Assessment X- 2 5 Circumferential Edema Measurements - multi extremities X- 1 10 Nutritional Assessment / Counseling / Intervention []  - 0 Lower Extremity Assessment (monofilament, tuning fork, pulses) []  - 0 Peripheral Arterial Disease Assessment (using hand held doppler) ASSESSMENTS - Ostomy and/or Continence Assessment and Care []  - 0 Incontinence Assessment and Management []  - 0 Ostomy Care Assessment and Management (repouching, etc.) PROCESS - Coordination of Care []  - 0 Simple Patient / Family Education for ongoing care X- 1 20 Complex (extensive) Patient / Family Education for ongoing care X- 1 10 Staff obtains Programmer, systems, Records, T Results / Process Orders est []  - 0 Staff telephones HHA, Nursing Homes / Clarify orders / etc []  - 0 Routine Transfer to another Facility (non-emergent condition) []  - 0 Routine Hospital Admission (non-emergent condition) []  - 0 New Admissions / Biomedical engineer / Ordering NPWT Apligraf, etc. , []  - 0 Emergency Hospital Admission (emergent condition) []  - 0 Simple Discharge Coordination X- 1 15 Complex (extensive) Discharge Coordination PROCESS - Special Needs []  - 0 Pediatric / Minor Patient Management []  - 0 Isolation Patient Management []  - 0 Hearing / Language / Visual special needs []  - 0 Assessment of Community assistance (transportation, D/C planning, etc.) []  - 0 Additional  assistance / Altered mentation []  - 0 Support Surface(s) Assessment (bed, cushion, seat, etc.) INTERVENTIONS - Wound Cleansing / Measurement []  - 0 Simple Wound Cleansing - one wound X- 2 5 Complex  Wound Cleansing - multiple wounds X- 1 5 Wound Imaging (photographs - any number of wounds) []  - 0 Wound Tracing (instead of photographs) []  - 0 Simple Wound Measurement - one wound X- 2 5 Complex Wound Measurement - multiple wounds INTERVENTIONS - Wound Dressings []  - 0 Small Wound Dressing one or multiple wounds []  - 0 Medium Wound Dressing one or multiple wounds []  - 0 Large Wound Dressing one or multiple wounds []  - 0 Application of Medications - topical []  - 0 Application of Medications - injection INTERVENTIONS - Miscellaneous []  - 0 External ear exam []  - 0 Specimen Collection (cultures, biopsies, blood, body fluids, etc.) []  - 0 Specimen(s) / Culture(s) sent or taken to Lab for analysis []  - 0 Patient Transfer (multiple staff / Civil Service fast streamer / Similar devices) []  - 0 Simple Staple / Suture removal (25 or less) []  - 0 Complex Staple / Suture removal (26 or more) []  - 0 Hypo / Hyperglycemic Management (close monitor of Blood Glucose) []  - 0 Ankle / Brachial Index (ABI) - do not check if billed separately X- 1 5 Vital Signs Has the patient been seen at the hospital within the last three years: Yes Total Score: 130 Level Of Care: New/Established - Level 4 Electronic Signature(s) Signed: 06/29/2020 4:38:16 PM By: Deon Pilling Entered By: Deon Pilling on 06/29/2020 12:57:53 -------------------------------------------------------------------------------- Encounter Discharge Information Details Patient Name: Date of Service: [place], [name] LD O. 06/29/2020 12:30 PM Medical Record Number: [id number] Patient Account Number: [id number] Date of Birth/Sex: Treating RN: 1931-08-10 (87 y.o. [name] Primary Care Lenyx Boody: [name] Other Clinician: Referring  Michell Giuliano: Treating Grizel Vesely/Extender: Judeth Porch in Treatment: 8 Encounter Discharge Information Items Discharge Condition: Stable Ambulatory Status: Ambulatory Discharge Destination: Home Transportation: Private Auto Accompanied By: self Schedule Follow-up Appointment: No Clinical Summary of Care: Electronic Signature(s) Signed: 06/29/2020 4:38:16 PM By: Deon Pilling Entered By: Deon Pilling on 06/29/2020 13:02:24 -------------------------------------------------------------------------------- Lower Extremity Assessment Details Patient Name: Date of Service: [place], [place] LD O. 06/29/2020 12:30 PM Medical Record Number: [id number] Patient Account Number: [id number] Date of Birth/Sex: Treating RN: [birth year] (87 y.o. [name] Primary Care Erich Kochan: [name] Other Clinician: Referring Avian Greenawalt: Treating Jocob Dambach/Extender: Judeth Porch in Treatment: 8 Electronic Signature(s) Signed: 06/29/2020 4:32:22 PM By: Baruch Gouty RN, BSN Entered By: Baruch Gouty on 06/29/2020 12:47:10 -------------------------------------------------------------------------------- Multi Wound Chart Details Patient Name: Date of Service: [place], [place] LD O. 06/29/2020 12:30 PM Medical Record Number: [id number] Patient Account Number: [id number] Date of Birth/Sex: Treating RN: 02-08-1931 (87 y.o. [name], Meta.Reding Primary Care Benjimin Hadden: [name] Other Clinician: Referring Alicja Everitt: Treating Camreigh Michie/Extender: Judeth Porch in Treatment: 8 Vital Signs Height(in): 69 Pulse(bpm): 73 Weight(lbs): 213 Blood Pressure(mmHg): 137/63 Body Mass Index(BMI): 31 Temperature(F): 97.9 Respiratory Rate(breaths/min): 18 Photos: [1:No Photos Left, Proximal Forearm] [4:No Photos Left Upper Arm] [N/A:N/A N/A] Wound Location: [1:Trauma] [4:Trauma] [N/A:N/A] Wounding Event: [1:Skin T ear] [4:Skin T ear]  [N/A:N/A] Primary Etiology: [1:Anemia, Arrhythmia, Hypertension,] [4:Anemia, Arrhythmia, Hypertension,] [N/A:N/A] Comorbid History: [1:Peripheral Venous Disease, Type II Diabetes, Gout, Confinement Anxiety 04/02/2020] [4:Peripheral Venous Disease, Type II Diabetes, Gout, Confinement Anxiety 06/04/2020] [N/A:N/A] Date Acquired: [1:8] [4:3] [N/A:N/A] Weeks of Treatment: [1:Open] [4:Healed - Epithelialized] [N/A:N/A] Wound Status: [1:0x0x0] [4:0x0x0] [N/A:N/A] Measurements L x W x D (cm) [1:0] [4:0] [N/A:N/A] A (cm) : rea [1:0] [4:0] [N/A:N/A] Volume (cm) : [1:100.00%] [4:100.00%] [N/A:N/A] % Reduction in Area: [1:100.00%] [4:100.00%] [N/A:N/A] % Reduction in Volume: [1:Full Thickness Without Exposed] [4:Full Thickness  Without Exposed] [N/A:N/A] Classification: [1:Support Structures None Present] [4:Support Structures None Present] [N/A:N/A] Exudate Amount: [1:None Present (0%)] [4:None Present (0%)] [N/A:N/A] Granulation Amount: [1:None Present (0%)] [4:None Present (0%)] [N/A:N/A] Necrotic Amount: [1:Fascia: No] [4:Fascia: No] [N/A:N/A] Exposed Structures: [1:Fat Layer (Subcutaneous Tissue): No Tendon: No Muscle: No Joint: No Bone: No Large (67-100%)] [4:Fat Layer (Subcutaneous Tissue): No Tendon: No Muscle: No Joint: No Bone: No Large (67-100%)] [N/A:N/A] Treatment Notes Electronic Signature(s) Signed: 06/29/2020 1:34:43 PM By: Kalman Shan DO Signed: 06/29/2020 4:38:16 PM By: Deon Pilling Entered By: Kalman Shan on 06/29/2020 13:28:43 -------------------------------------------------------------------------------- Multi-Disciplinary Care Plan Details Patient Name: Date of Service: [place], [place] NA LD O. 06/29/2020 12:30 PM Medical Record Number: [id number] Patient Account Number: [id number] Date of Birth/Sex: Treating RN: 1931-04-16 (87 y.o. [name] Primary Care Shun Pletz: [name] Other Clinician: Referring Niaomi Cartaya: Treating Jomel Whittlesey/Extender: Judeth Porch in Treatment: 8 Active Inactive Electronic Signature(s) Signed: 06/29/2020 4:38:16 PM By: Deon Pilling Entered By: Deon Pilling on 06/29/2020 12:54:50 -------------------------------------------------------------------------------- Pain Assessment Details Patient Name: Date of Service: [place], [place] NA LD O. 06/29/2020 12:30 PM Medical Record Number: [id number] Patient Account Number: [id number] Date of Birth/Sex: Treating RN: 1931/01/05 (87 y.o. [name] Primary Care Winton Offord: [name] Other Clinician: Referring Jarrett Albor: Treating Amilliana Hayworth/Extender: Judeth Porch in Treatment: 8 Active Problems Location of Pain Severity and Description of Pain Patient Has Paino No Site Locations Rate the pain. Current Pain Level: 0 Pain Management and Medication Current Pain Management: Electronic Signature(s) Signed: 06/29/2020 4:32:22 PM By: Baruch Gouty RN, BSN Entered By: Baruch Gouty on 06/29/2020 12:47:02 -------------------------------------------------------------------------------- Patient/Caregiver Education Details Patient Name: Date of Service: [place] NA LD Jenetta Downer 6/28/2022andnbsp12:30 PM Medical Record Number: [id number] Patient Account Number: [id number] Date of Birth/Gender: Treating RN: 25-Apr-1931 (87 y.o. [name] Primary Care Physician: [name] Other Clinician: Referring Physician: Treating Physician/Extender: Judeth Porch in Treatment: 8 Education Assessment Education Provided To: Patient Education Topics Provided Wound/Skin Impairment: Handouts: Skin Care Do's and Dont's Methods: Explain/Verbal Responses: Reinforcements needed Electronic Signature(s) Signed: 06/29/2020 4:38:16 PM By: Deon Pilling Entered By: Deon Pilling on 06/29/2020 12:55:01 -------------------------------------------------------------------------------- Wound  Assessment Details Patient Name: Date of Service: [place], [place] LD O. 06/29/2020 12:30 PM Medical Record Number: [id number] Patient Account Number: [id number] Date of Birth/Sex: Treating RN: 1931/11/19 (87 y.o. [name] Primary Care Sylina Henion: [name] Other Clinician: Referring Bellami Farrelly: Treating Edgardo Petrenko/Extender: Judeth Porch in Treatment: 8 Wound Status Wound Number: 1 Primary Skin Tear Etiology: Wound Location: Left, Proximal Forearm Wound Open Wounding Event: Trauma Status: Date Acquired: 04/02/2020 Comorbid Anemia, Arrhythmia, Hypertension, Peripheral Venous Disease, Weeks Of Treatment: 8 History: Type II Diabetes, Gout, Confinement Anxiety Clustered Wound: No Wound Measurements Length: (cm) Width: (cm) Depth: (cm) Area: (cm) Volume: (cm) 0 % Reduction in Area: 100% 0 % Reduction in Volume: 100% 0 Epithelialization: Large (67-100%) 0 Tunneling: No 0 Undermining: No Wound Description Classification: Full Thickness Without Exposed Support Structures Exudate Amount: None Present Foul Odor After Cleansing: No Slough/Fibrino No Wound Bed Granulation Amount: None Present (0%) Exposed Structure Necrotic Amount: None Present (0%) Fascia Exposed: No Fat Layer (Subcutaneous Tissue) Exposed: No Tendon Exposed: No Muscle Exposed: No Joint Exposed: No Bone Exposed: No Electronic Signature(s) Signed: 06/29/2020 4:32:22 PM By: Baruch Gouty RN, BSN Entered By: Baruch Gouty on 06/29/2020 12:47:39 -------------------------------------------------------------------------------- Wound Assessment Details Patient Name: Date of Service: [place], [name] LD O. 06/29/2020 12:30 PM Medical Record Number: [id number] Patient Account Number: [id number] Date  of Birth/Sex: Treating RN: 10-27-1931 (87 y.o. [name] Primary Care [facility]: [name] Other Clinician: Referring Royal Beirne: Treating Bradrick Kamau/Extender:  Judeth Porch in Treatment: 8 Wound Status Wound Number: 4 Primary Skin Tear Etiology: Wound Location: Left Upper Arm Wound Healed - Epithelialized Wounding Event: Trauma Status: Date Acquired: 06/04/2020 Comorbid Anemia, Arrhythmia, Hypertension, Peripheral Venous Disease, Weeks Of Treatment: 3 History: Type II Diabetes, Gout, Confinement Anxiety Clustered Wound: No Wound Measurements Length: (cm) Width: (cm) Depth: (cm) Area: (cm) Volume: (cm) 0 % Reduction in Area: 100% 0 % Reduction in Volume: 100% 0 Epithelialization: Large (67-100%) 0 Tunneling: No 0 Undermining: No Wound Description Classification: Full Thickness Without Exposed Support Structures Exudate Amount: None Present Foul Odor After Cleansing: No Slough/Fibrino No Wound Bed Granulation Amount: None Present (0%) Exposed Structure Necrotic Amount: None Present (0%) Fascia Exposed: No Fat Layer (Subcutaneous Tissue) Exposed: No Tendon Exposed: No Muscle Exposed: No Joint Exposed: No Bone Exposed: No Electronic Signature(s) Signed: 06/29/2020 4:32:22 PM By: Baruch Gouty RN, BSN Entered By: Baruch Gouty on 06/29/2020 12:48:00 -------------------------------------------------------------------------------- [place] Details Patient Name: Date of Service: [place], [name] LD O. 06/29/2020 12:30 PM Medical Record Number: [id number] Patient Account Number: [id number] Date of Birth/Sex: Treating RN: October 31, 1931 (87 y.o. [name] Primary Care Xylina Rhoads: [name] Other Clinician: Referring Keyonni Percival: Treating Anikah Hogge/Extender: Judeth Porch in Treatment: 8 Vital Signs Time Taken: 12:46 Temperature (F): 97.9 Height (in): 69 Pulse (bpm): 73 Source: Stated Respiratory Rate (breaths/min): 18 Weight (lbs): 213 Blood Pressure (mmHg): 137/63 Source: Stated Reference Range: 80 - 120 mg / dl Body Mass Index (BMI): 31.5 Electronic  Signature(s) Signed: 06/29/2020 4:32:22 PM By: Baruch Gouty RN, BSN Entered By: Baruch Gouty on 06/29/2020 12:46:39

## 2020-06-30 ENCOUNTER — Other Ambulatory Visit: Payer: Self-pay

## 2020-06-30 ENCOUNTER — Telehealth: Payer: Self-pay | Admitting: Family Medicine

## 2020-06-30 ENCOUNTER — Encounter: Payer: Self-pay | Admitting: *Deleted

## 2020-06-30 ENCOUNTER — Telehealth: Payer: Self-pay

## 2020-06-30 DIAGNOSIS — I5030 Unspecified diastolic (congestive) heart failure: Secondary | ICD-10-CM

## 2020-06-30 DIAGNOSIS — R601 Generalized edema: Secondary | ICD-10-CM | POA: Diagnosis not present

## 2020-06-30 DIAGNOSIS — E8770 Fluid overload, unspecified: Secondary | ICD-10-CM

## 2020-06-30 NOTE — Telephone Encounter (Signed)
Referral placed and patient notified.  

## 2020-06-30 NOTE — Telephone Encounter (Signed)
The urologist is not supposed to do anything with his leg swelling.  The referral was for his testicular swelling

## 2020-06-30 NOTE — Telephone Encounter (Signed)
Called patient back and left message on vm that his referral was for testicular only.

## 2020-06-30 NOTE — Telephone Encounter (Signed)
Pt saw Dr Tamala Julian (Cardiology) in January 2021.  Due to his increased swelling and volume overload that is not improving w/ increased Lasix, he needs to be re-evaluated by Cardiology

## 2020-06-30 NOTE — Telephone Encounter (Signed)
Took call from patient regarding swelling in his legs. States increased dose of lasix has not helped. Also states it is very painful. Patient states we were looking into finding a specialist to help with this. Please advise.

## 2020-06-30 NOTE — Telephone Encounter (Signed)
Please re-refer to Cards, dx volume overload, diastolic CHF so that way they will call him to schedule

## 2020-06-30 NOTE — Telephone Encounter (Signed)
Patient called and states that the urologist told him that they will be able to deal with the swelling in his testicles but nothing they do will have anything to do with the swelling in his feet.  Please advise.

## 2020-07-01 ENCOUNTER — Telehealth: Payer: Self-pay

## 2020-07-01 DIAGNOSIS — R601 Generalized edema: Secondary | ICD-10-CM | POA: Diagnosis not present

## 2020-07-01 NOTE — Telephone Encounter (Signed)
Pt seen [facility] in [place] today nothing could be done surgically but wanted Dr Birdie Riddle to possibly increase fluid pill that fluid is what needs to come off.   Pt call back [phone] or [phone]

## 2020-07-09 DIAGNOSIS — Z7901 Long term (current) use of anticoagulants: Secondary | ICD-10-CM | POA: Diagnosis not present

## 2020-07-09 DIAGNOSIS — N433 Hydrocele, unspecified: Secondary | ICD-10-CM | POA: Diagnosis not present

## 2020-07-09 DIAGNOSIS — Z5181 Encounter for therapeutic drug level monitoring: Secondary | ICD-10-CM | POA: Diagnosis not present

## 2020-07-11 ENCOUNTER — Emergency Department (HOSPITAL_COMMUNITY): Payer: Medicare HMO

## 2020-07-11 ENCOUNTER — Encounter (HOSPITAL_COMMUNITY): Payer: Self-pay | Admitting: Emergency Medicine

## 2020-07-11 ENCOUNTER — Other Ambulatory Visit: Payer: Self-pay

## 2020-07-11 ENCOUNTER — Inpatient Hospital Stay (HOSPITAL_COMMUNITY)
Admission: EM | Admit: 2020-07-11 | Discharge: 2020-07-21 | DRG: 291 | Disposition: A | Discharge status: Home Health | Payer: Medicare HMO | Attending: Internal Medicine | Admitting: Internal Medicine

## 2020-07-11 DIAGNOSIS — Z66 Do not resuscitate: Secondary | ICD-10-CM | POA: Diagnosis not present

## 2020-07-11 DIAGNOSIS — I130 Hypertensive heart and chronic kidney disease with heart failure and stage 1 through stage 4 chronic kidney disease, or unspecified chronic kidney disease: Principal | ICD-10-CM | POA: Diagnosis present

## 2020-07-11 DIAGNOSIS — Z6832 Body mass index (BMI) 32.0-32.9, adult: Secondary | ICD-10-CM | POA: Diagnosis not present

## 2020-07-11 DIAGNOSIS — I4821 Permanent atrial fibrillation: Secondary | ICD-10-CM | POA: Diagnosis present

## 2020-07-11 DIAGNOSIS — M7989 Other specified soft tissue disorders: Secondary | ICD-10-CM

## 2020-07-11 DIAGNOSIS — S7011XA Contusion of right thigh, initial encounter: Secondary | ICD-10-CM | POA: Diagnosis not present

## 2020-07-11 DIAGNOSIS — R791 Abnormal coagulation profile: Secondary | ICD-10-CM | POA: Diagnosis present

## 2020-07-11 DIAGNOSIS — Z8249 Family history of ischemic heart disease and other diseases of the circulatory system: Secondary | ICD-10-CM | POA: Diagnosis not present

## 2020-07-11 DIAGNOSIS — D500 Iron deficiency anemia secondary to blood loss (chronic): Secondary | ICD-10-CM | POA: Diagnosis present

## 2020-07-11 DIAGNOSIS — N179 Acute kidney failure, unspecified: Secondary | ICD-10-CM | POA: Diagnosis present

## 2020-07-11 DIAGNOSIS — Z20822 Contact with and (suspected) exposure to covid-19: Secondary | ICD-10-CM | POA: Diagnosis present

## 2020-07-11 DIAGNOSIS — Y92003 Bedroom of unspecified non-institutional (private) residence as the place of occurrence of the external cause: Secondary | ICD-10-CM

## 2020-07-11 DIAGNOSIS — I361 Nonrheumatic tricuspid (valve) insufficiency: Secondary | ICD-10-CM | POA: Diagnosis not present

## 2020-07-11 DIAGNOSIS — I351 Nonrheumatic aortic (valve) insufficiency: Secondary | ICD-10-CM | POA: Diagnosis not present

## 2020-07-11 DIAGNOSIS — E1122 Type 2 diabetes mellitus with diabetic chronic kidney disease: Secondary | ICD-10-CM | POA: Diagnosis not present

## 2020-07-11 DIAGNOSIS — E119 Type 2 diabetes mellitus without complications: Secondary | ICD-10-CM | POA: Diagnosis not present

## 2020-07-11 DIAGNOSIS — R2241 Localized swelling, mass and lump, right lower limb: Secondary | ICD-10-CM | POA: Diagnosis not present

## 2020-07-11 DIAGNOSIS — E785 Hyperlipidemia, unspecified: Secondary | ICD-10-CM | POA: Diagnosis present

## 2020-07-11 DIAGNOSIS — R609 Edema, unspecified: Secondary | ICD-10-CM

## 2020-07-11 DIAGNOSIS — R0602 Shortness of breath: Secondary | ICD-10-CM | POA: Diagnosis not present

## 2020-07-11 DIAGNOSIS — D649 Anemia, unspecified: Secondary | ICD-10-CM | POA: Diagnosis present

## 2020-07-11 DIAGNOSIS — I10 Essential (primary) hypertension: Secondary | ICD-10-CM

## 2020-07-11 DIAGNOSIS — R601 Generalized edema: Secondary | ICD-10-CM | POA: Diagnosis not present

## 2020-07-11 DIAGNOSIS — M79669 Pain in unspecified lower leg: Secondary | ICD-10-CM | POA: Diagnosis not present

## 2020-07-11 DIAGNOSIS — N170 Acute kidney failure with tubular necrosis: Secondary | ICD-10-CM | POA: Diagnosis not present

## 2020-07-11 DIAGNOSIS — N5089 Other specified disorders of the male genital organs: Secondary | ICD-10-CM | POA: Diagnosis present

## 2020-07-11 DIAGNOSIS — I4891 Unspecified atrial fibrillation: Secondary | ICD-10-CM | POA: Diagnosis not present

## 2020-07-11 DIAGNOSIS — I482 Chronic atrial fibrillation, unspecified: Secondary | ICD-10-CM | POA: Diagnosis present

## 2020-07-11 DIAGNOSIS — K761 Chronic passive congestion of liver: Secondary | ICD-10-CM | POA: Diagnosis present

## 2020-07-11 DIAGNOSIS — W010XXA Fall on same level from slipping, tripping and stumbling without subsequent striking against object, initial encounter: Secondary | ICD-10-CM | POA: Diagnosis present

## 2020-07-11 DIAGNOSIS — I872 Venous insufficiency (chronic) (peripheral): Secondary | ICD-10-CM | POA: Diagnosis not present

## 2020-07-11 DIAGNOSIS — E8809 Other disorders of plasma-protein metabolism, not elsewhere classified: Secondary | ICD-10-CM | POA: Diagnosis not present

## 2020-07-11 DIAGNOSIS — Z79899 Other long term (current) drug therapy: Secondary | ICD-10-CM | POA: Diagnosis not present

## 2020-07-11 DIAGNOSIS — E669 Obesity, unspecified: Secondary | ICD-10-CM

## 2020-07-11 DIAGNOSIS — R600 Localized edema: Secondary | ICD-10-CM | POA: Diagnosis not present

## 2020-07-11 DIAGNOSIS — Z7901 Long term (current) use of anticoagulants: Secondary | ICD-10-CM

## 2020-07-11 DIAGNOSIS — E538 Deficiency of other specified B group vitamins: Secondary | ICD-10-CM | POA: Diagnosis present

## 2020-07-11 DIAGNOSIS — E782 Mixed hyperlipidemia: Secondary | ICD-10-CM | POA: Diagnosis not present

## 2020-07-11 DIAGNOSIS — I509 Heart failure, unspecified: Secondary | ICD-10-CM | POA: Diagnosis not present

## 2020-07-11 DIAGNOSIS — J189 Pneumonia, unspecified organism: Secondary | ICD-10-CM | POA: Diagnosis not present

## 2020-07-11 DIAGNOSIS — Z87891 Personal history of nicotine dependence: Secondary | ICD-10-CM

## 2020-07-11 DIAGNOSIS — I340 Nonrheumatic mitral (valve) insufficiency: Secondary | ICD-10-CM | POA: Diagnosis not present

## 2020-07-11 DIAGNOSIS — R7303 Prediabetes: Secondary | ICD-10-CM

## 2020-07-11 DIAGNOSIS — Z833 Family history of diabetes mellitus: Secondary | ICD-10-CM | POA: Diagnosis not present

## 2020-07-11 DIAGNOSIS — K449 Diaphragmatic hernia without obstruction or gangrene: Secondary | ICD-10-CM | POA: Diagnosis not present

## 2020-07-11 DIAGNOSIS — R0609 Other forms of dyspnea: Secondary | ICD-10-CM | POA: Diagnosis not present

## 2020-07-11 DIAGNOSIS — I5033 Acute on chronic diastolic (congestive) heart failure: Secondary | ICD-10-CM

## 2020-07-11 DIAGNOSIS — N1832 Chronic kidney disease, stage 3b: Secondary | ICD-10-CM | POA: Diagnosis present

## 2020-07-11 DIAGNOSIS — J90 Pleural effusion, not elsewhere classified: Secondary | ICD-10-CM | POA: Diagnosis not present

## 2020-07-11 DIAGNOSIS — I5031 Acute diastolic (congestive) heart failure: Secondary | ICD-10-CM

## 2020-07-11 HISTORY — DX: Chronic diastolic (congestive) heart failure: I50.32

## 2020-07-11 HISTORY — DX: Chronic atrial fibrillation, unspecified: I48.20

## 2020-07-11 HISTORY — DX: Nonrheumatic aortic (valve) insufficiency: I35.1

## 2020-07-11 HISTORY — DX: Type 2 diabetes mellitus without complications: E11.9

## 2020-07-11 HISTORY — DX: Chronic kidney disease, stage 3 unspecified: N18.30

## 2020-07-11 HISTORY — DX: Abnormal findings on diagnostic imaging of liver and biliary tract: R93.2

## 2020-07-11 LAB — CBC WITH DIFFERENTIAL/PLATELET
Abs Immature Granulocytes: 0.02 10*3/uL (ref 0.00–0.07)
Basophils Absolute: 0.1 10*3/uL (ref 0.0–0.1)
Basophils Relative: 1 %
Eosinophils Absolute: 0.2 10*3/uL (ref 0.0–0.5)
Eosinophils Relative: 3 %
HCT: 37.1 % — ABNORMAL LOW (ref 39.0–52.0)
Hemoglobin: 11.9 g/dL — ABNORMAL LOW (ref 13.0–17.0)
Immature Granulocytes: 0 %
Lymphocytes Relative: 18 %
Lymphs Abs: 1.1 10*3/uL (ref 0.7–4.0)
MCH: 30.8 pg (ref 26.0–34.0)
MCHC: 32.1 g/dL (ref 30.0–36.0)
MCV: 96.1 fL (ref 80.0–100.0)
Monocytes Absolute: 0.7 10*3/uL (ref 0.1–1.0)
Monocytes Relative: 12 %
Neutro Abs: 3.9 10*3/uL (ref 1.7–7.7)
Neutrophils Relative %: 66 %
Platelets: 305 10*3/uL (ref 150–400)
RBC: 3.86 MIL/uL — ABNORMAL LOW (ref 4.22–5.81)
RDW: 17.4 % — ABNORMAL HIGH (ref 11.5–15.5)
WBC: 6 10*3/uL (ref 4.0–10.5)
nRBC: 0 % (ref 0.0–0.2)

## 2020-07-11 LAB — COMPREHENSIVE METABOLIC PANEL
ALT: 11 U/L (ref 0–44)
AST: 22 U/L (ref 15–41)
Albumin: 2.9 g/dL — ABNORMAL LOW (ref 3.5–5.0)
Alkaline Phosphatase: 93 U/L (ref 38–126)
Anion gap: 5 (ref 5–15)
BUN: 23 mg/dL (ref 8–23)
CO2: 29 mmol/L (ref 22–32)
Calcium: 8.6 mg/dL — ABNORMAL LOW (ref 8.9–10.3)
Chloride: 103 mmol/L (ref 98–111)
Creatinine, Ser: 1.74 mg/dL — ABNORMAL HIGH (ref 0.61–1.24)
GFR, Estimated: 37 mL/min — ABNORMAL LOW (ref >60)
Glucose, Bld: 97 mg/dL (ref 70–99)
Potassium: 4.4 mmol/L (ref 3.5–5.1)
Sodium: 137 mmol/L (ref 135–145)
Total Bilirubin: 2.4 mg/dL — ABNORMAL HIGH (ref 0.3–1.2)
Total Protein: 6.5 g/dL (ref 6.5–8.1)

## 2020-07-11 LAB — BRAIN NATRIURETIC PEPTIDE: B Natriuretic Peptide: 420.8 pg/mL — ABNORMAL HIGH (ref 0.0–100.0)

## 2020-07-11 LAB — RESP PANEL BY RT-PCR (FLU A&B, COVID) ARPGX2
Influenza A by PCR: NEGATIVE
Influenza B by PCR: NEGATIVE
SARS Coronavirus 2 by RT PCR: NEGATIVE

## 2020-07-11 LAB — TROPONIN I (HIGH SENSITIVITY)
Troponin I (High Sensitivity): 11 ng/L (ref <18)
Troponin I (High Sensitivity): 11 ng/L (ref <18)

## 2020-07-11 LAB — PROTIME-INR
INR: 3.8 — ABNORMAL HIGH (ref 0.8–1.2)
Prothrombin Time: 37.6 seconds — ABNORMAL HIGH (ref 11.4–15.2)

## 2020-07-11 MED ORDER — DOCUSATE SODIUM 100 MG PO CAPS
100.0000 mg | ORAL_CAPSULE | Freq: Two times a day (BID) | ORAL | Status: DC
  Administered 2020-07-11 – 2020-07-21 (×20): 100 mg via ORAL
  Filled 2020-07-11 (×20): qty 1

## 2020-07-11 MED ORDER — IOHEXOL 350 MG/ML SOLN
70.0000 mL | Freq: Once | INTRAVENOUS | Status: AC | PRN
  Administered 2020-07-11: 70 mL via INTRAVENOUS

## 2020-07-11 MED ORDER — INSULIN ASPART 100 UNIT/ML IJ SOLN
0.0000 [IU] | INTRAMUSCULAR | Status: DC
  Administered 2020-07-11: 1 [IU] via SUBCUTANEOUS
  Administered 2020-07-12: 0 [IU] via SUBCUTANEOUS
  Administered 2020-07-12: 1 [IU] via SUBCUTANEOUS
  Filled 2020-07-11: qty 0.09

## 2020-07-11 MED ORDER — SIMVASTATIN 20 MG PO TABS
20.0000 mg | ORAL_TABLET | Freq: Every day | ORAL | Status: DC
  Administered 2020-07-12 – 2020-07-21 (×10): 20 mg via ORAL
  Filled 2020-07-11 (×10): qty 1

## 2020-07-11 MED ORDER — ACETAMINOPHEN 325 MG PO TABS
650.0000 mg | ORAL_TABLET | Freq: Four times a day (QID) | ORAL | Status: DC | PRN
  Administered 2020-07-13: 650 mg via ORAL
  Filled 2020-07-11: qty 2

## 2020-07-11 MED ORDER — ACETAMINOPHEN 650 MG RE SUPP: 650.0000 mg | Freq: Four times a day (QID) | RECTAL | Status: DC | PRN

## 2020-07-11 MED ORDER — METOPROLOL SUCCINATE ER 50 MG PO TB24
50.0000 mg | ORAL_TABLET | Freq: Every day | ORAL | Status: DC
  Administered 2020-07-12 – 2020-07-21 (×10): 50 mg via ORAL
  Filled 2020-07-11 (×10): qty 1

## 2020-07-11 MED ORDER — SODIUM CHLORIDE 0.9% FLUSH: 3.0000 mL | INTRAVENOUS | Status: DC | PRN

## 2020-07-11 MED ORDER — FUROSEMIDE 10 MG/ML IJ SOLN
40.0000 mg | Freq: Two times a day (BID) | INTRAMUSCULAR | Status: DC
  Administered 2020-07-11 – 2020-07-12 (×2): 40 mg via INTRAVENOUS
  Filled 2020-07-11 (×2): qty 4

## 2020-07-11 MED ORDER — SODIUM CHLORIDE 0.9 % IV SOLN: 250.0000 mL | INTRAVENOUS | Status: DC | PRN

## 2020-07-11 MED ORDER — SODIUM CHLORIDE 0.9% FLUSH
3.0000 mL | Freq: Two times a day (BID) | INTRAVENOUS | Status: DC
  Administered 2020-07-12 – 2020-07-21 (×20): 3 mL via INTRAVENOUS

## 2020-07-11 MED ORDER — ALLOPURINOL 300 MG PO TABS
300.0000 mg | ORAL_TABLET | Freq: Every day | ORAL | Status: DC
  Administered 2020-07-12 – 2020-07-21 (×10): 300 mg via ORAL
  Filled 2020-07-11 (×10): qty 1

## 2020-07-11 MED ORDER — HYDROCODONE-ACETAMINOPHEN 5-325 MG PO TABS
1.0000 | ORAL_TABLET | ORAL | Status: DC | PRN
  Administered 2020-07-14: 1 via ORAL
  Administered 2020-07-15 – 2020-07-16 (×3): 2 via ORAL
  Filled 2020-07-11: qty 2
  Filled 2020-07-11: qty 1
  Filled 2020-07-11 (×2): qty 2

## 2020-07-11 NOTE — ED Notes (Addendum)
Pt ambulated around room oxygen saturation dropped to 90%. Pt gait was steady.

## 2020-07-11 NOTE — H&P (Signed)
[name] YPP:[id number] DOB: [birth year] DOA: 07/11/2020     PCP: Midge Minium, MD   Outpatient Specialists:   CARDS:  Dr.Macphee    Patient arrived to ER on 07/11/20 at 1453 Referred by Attending Tegeler, Gwenyth Allegra, *   Patient coming from: home Lives  With family     Chief Complaint:   Chief Complaint  Patient presents with   Leg Swelling    HPI: [name] is a 87 y.o. 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  There is no records of that noted. He denies having ultrasound done at that time.  Patient have had hemorrhoid surgery which damaged his sphincter and he reports that he needs to use manual force in order to defecate Has been vaccinated against COVID  and boosted   Initial COVID TEST  NEGATIVE   Lab Results  Component Value Date   [place] NEGATIVE 07/11/2020   [place] Not Detected 11/22/2018     Regarding pertinent Chronic problems:     Hyperlipidemia - on statins Zocor Lipid Panel     Component Value Date/Time   CHOL 96 06/18/2020 1315   TRIG 46.0 06/18/2020 1315   HDL 33.50 (L) 06/18/2020 1315   CHOLHDL 3 06/18/2020 1315   VLDL 9.2 06/18/2020 1315   LDLCALC 54 06/18/2020 1315   HTN on lisinopril Toprol   chronic CHF diastolic  - last echo 4580 showing diastolic dysfunction   DM 2 -  Lab Results  Component Value Date   HGBA1C 6.0 06/18/2020    on   PO meds only,     obesity-   BMI Readings from Last 1 Encounters:  07/11/20 32.59  kg/m   A. Fib -  - CHA2DS2 vas score   5    current  on anticoagulation with  Coumadin            -  Rate control:  Currently controlled with Toprolol,       CKD stage IIIb- baseline Cr 1.6 Estimated Creatinine Clearance: 37.1 mL/min (A) (by C-G formula based on SCr of 1.74 mg/dL (H)).  Lab Results  Component Value Date   CREATININE 1.74 (H) 07/11/2020   CREATININE 1.57 (H) 06/18/2020   CREATININE 1.9 (A) 08/01/2019     Chronic anemia - baseline hg Hemoglobin & Hematocrit  Recent Labs    08/01/19 0000 06/18/20 1315 07/11/20 1749  HGB 11.9* 11.0* 11.9*    While in ER: Dopplers negative for DVT Creatinine slightly above baseline.  BNP elevated 420 albumin down to 2.9. With ambulation patient oxygenation well down to the low 90s. CTA negative for PE but did show bilateral pleural effusions with questionable consolidation ED Triage Vitals  Enc Vitals Group     BP 07/11/20 1534 (!) 144/57     Pulse Rate 07/11/20 1534 67     Resp 07/11/20 1534 18     Temp 07/11/20  1534 97.6 F (36.4 C)     Temp Source 07/11/20 1534 Oral     SpO2 07/11/20 1534 100 %     Weight 07/11/20 1544 233 lb 11 oz (106 kg)     Height 07/11/20 1544 5\' 11"  (1.803 m)     Head Circumference --      Peak Flow --      Pain Score 07/11/20 1544 0     Pain Loc --      Pain Edu? --      Excl. in [place]? --   TMAX(24)@     _________________________________________ Significant initial  Findings: Abnormal Labs Reviewed  BRAIN NATRIURETIC PEPTIDE - Abnormal; Notable for the following components:      Result Value   B Natriuretic Peptide 420.8 (*)    All other components within normal limits  COMPREHENSIVE METABOLIC PANEL - Abnormal; Notable for the following components:   Creatinine, Ser 1.74 (*)    Calcium 8.6 (*)    Albumin 2.9 (*)    Total Bilirubin 2.4 (*)    GFR, Estimated 37 (*)    All other components within normal limits  PROTIME-INR - Abnormal; Notable for the following components:   Prothrombin  Time 37.6 (*)    INR 3.8 (*)    All other components within normal limits  CBC WITH DIFFERENTIAL/PLATELET - Abnormal; Notable for the following components:   RBC 3.86 (*)    Hemoglobin 11.9 (*)    HCT 37.1 (*)    RDW 17.4 (*)    All other components within normal limits   ____________________________________________ Ordered      CXR -bilateral pleural effusions with consolidation    CTA chest -  no PE,   evidence of infiltrate   _________________________ Troponin 11 - 11 ECG: Ordered Personally reviewed by me showing: HR : 63 Rhythm: A.fib.    no evidence of ischemic changes QTC 435 _______    The recent clinical data is shown below. Vitals:   07/11/20 1930 07/11/20 2000 07/11/20 2100 07/11/20 2200  BP: (!) 154/63 (!) 157/65 (!) 163/63 (!) 144/64  Pulse: 72 77 79 81  Resp: 19 20 18 19   Temp:      TempSrc:      SpO2: 100% 99% 99% 97%  Weight:      Height:         WBC     Component Value Date/Time   WBC 6.0 07/11/2020 1749   LYMPHSABS 1.1 07/11/2020 1749   MONOABS 0.7 07/11/2020 1749   EOSABS 0.2 07/11/2020 1749   BASOSABS 0.1 07/11/2020 1749      UA  ordered    Results for orders placed or performed during the hospital encounter of 07/11/20  Resp Panel by RT-PCR (Flu A&B, Covid) Nasopharyngeal Swab     Status: None   Collection Time: 07/11/20  6:10 PM   Specimen: Nasopharyngeal Swab; Nasopharyngeal(NP) swabs in vial transport medium  Result Value Ref Range Status   SARS Coronavirus 2 by RT PCR NEGATIVE NEGATIVE Final          Influenza A by PCR NEGATIVE NEGATIVE Final   Influenza B by PCR NEGATIVE NEGATIVE Final           _______________ Hospitalist was called for admission for anasarca  The following Work up has been ordered so far:  Orders Placed This Encounter  Procedures   Resp Panel by RT-PCR (Flu A&B, Covid) Nasopharyngeal Swab   DG Chest 2 View   CT  Angio Chest PE W and/or Wo Contrast   Brain natriuretic peptide   Comprehensive  metabolic panel   Protime-INR   CBC with Differential   Check Pulse Oximetry while ambulating   Consult to hospitalist   ED EKG   VAS [place] LOWER EXTREMITY VENOUS (DVT) ([facility] and [facility] 7a-7p)     Following Medications were ordered in ER: Medications  iohexol (OMNIPAQUE) 350 MG/ML injection 70 mL (70 mLs Intravenous Contrast Given 07/11/20 1903)        Consult Orders  (From admission, onward)           Start     Ordered   07/11/20 2250  Consult to hospitalist  Once       Provider:  (Not yet assigned)  Question Answer Comment  Place call to: [facility]   Reason for Consult Admit      07/11/20 2249             OTHER Significant initial  Findings:  labs showing:    Recent Labs  Lab 07/11/20 1556  NA 137  K 4.4  CO2 29  GLUCOSE 97  BUN 23  CREATININE 1.74*  CALCIUM 8.6*    Cr    Up from baseline see below Lab Results  Component Value Date   CREATININE 1.74 (H) 07/11/2020   CREATININE 1.57 (H) 06/18/2020   CREATININE 1.9 (A) 08/01/2019    Recent Labs  Lab 07/11/20 1556  AST 22  ALT 11  ALKPHOS 93  BILITOT 2.4*  PROT 6.5  ALBUMIN 2.9*   Lab Results  Component Value Date   CALCIUM 8.6 (L) 07/11/2020   PHOS 2.4 (L) 01/23/2016        Plt: Lab Results  Component Value Date   PLT 305 07/11/2020       Recent Labs  Lab 07/11/20 1749  WBC 6.0  NEUTROABS 3.9  HGB 11.9*  HCT 37.1*  MCV 96.1  PLT 305    HG/HCT  stable,      Component Value Date/Time   HGB 11.9 (L) 07/11/2020 1749   HCT 37.1 (L) 07/11/2020 1749   MCV 96.1 07/11/2020 1749     BNP (last 3 results) Recent Labs    07/11/20 1556  BNP 420.8*      DM  labs:  HbA1C: Recent Labs    08/01/19 0000 06/18/20 1315  HGBA1C 6.1 6.0          Cultures: No results found for: SDES, SPECREQUEST, CULT, REPTSTATUS   Radiological Exams on Admission: DG Chest 2 View  Result Date: 07/11/2020 CLINICAL DATA:  Four weeks of lower extremity swelling and edema. Shortness of  breath on exertion. EXAM: CHEST - 2 VIEW COMPARISON:  CT abdomen January 21, 2016. FINDINGS: The heart size and mediastinal contours are partially obscured. Aortic atherosclerosis. Moderate hiatal hernia. Moderate left pleural effusion with adjacent parenchymal consolidation versus atelectasis. Right lung is predominantly clear. Prior lower thoracic vertebral augmentation. IMPRESSION: Moderate left pleural effusion with adjacent parenchymal consolidation versus atelectasis. Electronically Signed   By: Dahlia Bailiff MD   On: 07/11/2020 16:16   CT Angio Chest PE W and/or Wo Contrast  Result Date: 07/11/2020 CLINICAL DATA:  Increasing shortness of breath EXAM: CT ANGIOGRAPHY CHEST WITH CONTRAST TECHNIQUE: Multidetector CT imaging of the chest was performed using the standard protocol during bolus administration of intravenous contrast. Multiplanar CT image reconstructions and MIPs were obtained to evaluate the vascular anatomy. CONTRAST:  45mL OMNIPAQUE IOHEXOL 350 MG/ML SOLN COMPARISON:  Chest x-ray from earlier in the same day. FINDINGS: Cardiovascular: Thoracic aorta demonstrates atherosclerotic calcifications without aneurysmal dilatation. Coronary calcifications are seen. Mild cardiac enlargement is noted. Pulmonary artery is well visualized bilaterally within normal branching pattern. No intraluminal filling defect is identified to suggest pulmonary embolism. Mediastinum/Nodes: Thoracic inlet is within normal limits. No sizable hilar or mediastinal adenopathy is noted. The esophagus as visualized is within normal limits with the exception of a sliding-type hiatal hernia. Lungs/Pleura: Bilateral pleural effusions are noted, left considerably greater than right. Right lung demonstrates basilar lower lobe atelectatic changes. More marked consolidation is noted in the left lower lobe and lingula. No discrete mass is noted on this exam. No sizable parenchymal nodule is seen. Upper Abdomen: Visualized upper  abdomen is within normal limits. Musculoskeletal: Degenerative changes of the thoracic spine are noted. No acute rib abnormality is noted. Compression deformity with evidence of prior vertebral augmentation is seen at T12. Mild upper compression deformities are noted which appear chronic in nature. Review of the MIP images confirms the above findings. IMPRESSION: Bilateral pleural effusions left greater than right with associated consolidation. Hiatal hernia. No evidence of pulmonary emboli Aortic Atherosclerosis (ICD10-I70.0). Electronically Signed   By: Inez Catalina M.D.   On: 07/11/2020 19:42   VAS [place] LOWER EXTREMITY VENOUS (DVT) ([facility] and [facility] 7a-7p)  Result Date: 07/11/2020  Lower Venous DVT Study Patient Name:  [name]  Date of Exam:   07/11/2020 Medical Rec #: [id number]       Accession #:    [id number] Date of Birth: November 06, 1931       Patient Gender: M Patient Age:   89Y Exam Location:  [facility] Procedure:      VAS [place] LOWER EXTREMITY VENOUS (DVT) Referring Phys: [id number] [place] --------------------------------------------------------------------------------  Indications: Swelling, Edema, and Pain.  Limitations: Poor ultrasound/tissue interface. Comparison Study: no prior Performing Technologist: Archie Patten RVS  Examination Guidelines: A complete evaluation includes B-mode imaging, spectral Doppler, color Doppler, and power Doppler as needed of all accessible portions of each vessel. Bilateral testing is considered an integral part of a complete examination. Limited examinations for reoccurring indications may be performed as noted. The reflux portion of the exam is performed with the patient in reverse Trendelenburg.  +---------+---------------+---------+-----------+----------+-------------------+ RIGHT    CompressibilityPhasicitySpontaneityPropertiesThrombus Aging      +---------+---------------+---------+-----------+----------+-------------------+ CFV      Full            Yes      Yes                                      +---------+---------------+---------+-----------+----------+-------------------+ SFJ      Full                                                             +---------+---------------+---------+-----------+----------+-------------------+ FV Prox  Full                                                             +---------+---------------+---------+-----------+----------+-------------------+ FV Mid  Yes      Yes                                      +---------+---------------+---------+-----------+----------+-------------------+ FV Distal               Yes      Yes                                      +---------+---------------+---------+-----------+----------+-------------------+ PFV      Full                                                             +---------+---------------+---------+-----------+----------+-------------------+ POP      Full           Yes      Yes                                      +---------+---------------+---------+-----------+----------+-------------------+ PTV      Full                                                             +---------+---------------+---------+-----------+----------+-------------------+ PERO                                                  Not well visualized +---------+---------------+---------+-----------+----------+-------------------+   +---------+---------------+---------+-----------+----------+-------------------+ LEFT     CompressibilityPhasicitySpontaneityPropertiesThrombus Aging      +---------+---------------+---------+-----------+----------+-------------------+ CFV      Full           Yes      Yes                                      +---------+---------------+---------+-----------+----------+-------------------+ SFJ      Full                                                              +---------+---------------+---------+-----------+----------+-------------------+ FV Prox  Full                                                             +---------+---------------+---------+-----------+----------+-------------------+ FV Mid   Full                                                             +---------+---------------+---------+-----------+----------+-------------------+  FV DistalFull                                                             +---------+---------------+---------+-----------+----------+-------------------+ PFV      Full                                                             +---------+---------------+---------+-----------+----------+-------------------+ POP      Full           Yes      Yes                                      +---------+---------------+---------+-----------+----------+-------------------+ PTV      Full                                                             +---------+---------------+---------+-----------+----------+-------------------+ PERO                                                  Not well visualized +---------+---------------+---------+-----------+----------+-------------------+     Summary: RIGHT: - There is no evidence of deep vein thrombosis in the lower extremity. However, portions of this examination were limited- see technologist comments above.  - No cystic structure found in the popliteal fossa.  LEFT: - There is no evidence of deep vein thrombosis in the lower extremity. However, portions of this examination were limited- see technologist comments above.  - No cystic structure found in the popliteal fossa.  *See table(s) above for measurements and observations.    Preliminary    _______________________________________________________________________________________________________ Latest  Blood pressure (!) 144/64, pulse 81, temperature 97.6 F (36.4 C), temperature source Oral, resp.  rate 19, height 5\' 11"  (1.803 m), weight 106 kg, SpO2 97 %.   Review of Systems:    Pertinent positives include:  fatigue,shortness of breath at rest.  dyspnea on exertion, Orthopnea, Bilateral lower extremity swelling   Constitutional:  No weight loss, night sweats, Fevers, chills,  weight loss  HEENT:  No headaches, Difficulty swallowing,Tooth/dental problems,Sore throat,  No sneezing, itching, ear ache, nasal congestion, post nasal drip,  Cardio-vascular:  No chest pain,  PND, anasarca, dizziness, palpitations.no  GI:  No heartburn, indigestion, abdominal pain, nausea, vomiting, diarrhea, change in bowel habits, loss of appetite, melena, blood in stool, hematemesis Resp:  no  No excess mucus, no productive cough, No non-productive cough, No coughing up of blood.No change in color of mucus.No wheezing. Skin:  no rash or lesions. No jaundice GU:  no dysuria, change in color of urine, no urgency or frequency. No straining to urinate.  No flank pain.  Musculoskeletal:  No joint pain or no joint swelling. No decreased range of motion.  No back pain.  Psych:  No change in mood or affect. No depression or anxiety. No memory loss.  Neuro: no localizing neurological complaints, no tingling, no weakness, no double vision, no gait abnormality, no slurred speech, no confusion  All systems reviewed and apart from [place] all are negative _______________________________________________________________________________________________ Past Medical History:   Past Medical History:  Diagnosis Date   Anemia    Atrial fibrillation ([place])    Blood transfusion without reported diagnosis    Chronic constipation    colace helps   Chronic renal insufficiency, stage II (mild)    CrCl @60 .   Chronic venous insufficiency    Diabetes mellitus without complication (HCC)    hx of good control   Gout    Hyperlipidemia    Hypertension    Osteopenia    Stercoral ulcer of rectum    Varicose veins of both  lower extremities with complications    Vitamin B12 deficiency     Past Surgical History:  Procedure Laterality Date   BUNIONECTOMY  2006   COLONOSCOPY N/A 01/22/2016   Procedure: COLONOSCOPY;  Surgeon: Manus Gunning, MD;  Location: [facility];  Service: Gastroenterology;  Laterality: N/A;   [place] AND ADENOIDECTOMY  1945    Social History:  Ambulatory  cane      reports that he has quit smoking. His smoking use included cigarettes. He has never used smokeless tobacco. He reports that he does not drink alcohol and does not use drugs.    Family History:   Family History  Problem Relation Age of Onset   Alcohol abuse Father    Diabetes Father    Obesity Brother    Obesity Daughter        S/P gastric bypass   ______________________________________________________________________________________________ Allergies: Allergies  Allergen Reactions   Influenza Vac Split [Influenza Virus Vaccine] Nausea And Vomiting     Prior to Admission medications   Medication Sig Start Date End Date Taking? Authorizing Provider  allopurinol (ZYLOPRIM) 300 MG tablet TAKE 1 TABLET (300 MG TOTAL) BY MOUTH DAILY. 02/02/20   Midge Minium, MD  Calcium Carbonate-Vit D-Min (CALCIUM 1200 PO) Take by mouth.    [provider]  lisinopril (ZESTRIL) 2.5 MG tablet TAKE 1 TABLET BY MOUTH DAILY. PLEASE MAKE APPT. THIS WILL BE THE LAST REFILL WITHOUT AN APPT 06/28/20   Midge Minium, MD  metFORMIN (GLUCOPHAGE) 500 MG tablet TAKE 1 TABLET BY MOUTH EVERY DAY 02/04/20   Midge Minium, MD  metoprolol succinate (TOPROL-XL) 50 MG 24 hr tablet TAKE 1 TABLET BY MOUTH EVERY DAY 02/23/20   Midge Minium, MD  simvastatin (ZOCOR) 20 MG tablet TAKE 1 TABLET BY MOUTH EVERY DAY 02/26/20   Midge Minium, MD  torsemide (DEMADEX) 20 MG tablet Take 1 tablet (20 mg total) by mouth daily. 06/18/20   Midge Minium, MD  vitamin B-12 (CYANOCOBALAMIN)  500 MCG tablet Take 500 mcg by mouth daily.    [provider]  warfarin (COUMADIN) 5 MG tablet TAKE AS DIRECTED BY ANTICOAGULATION CLINIC 05/17/20   Midge Minium, MD    ___________________________________________________________________________________________________ Physical Exam: Vitals with BMI 07/11/2020 07/11/2020 07/11/2020  Height - - -  Weight - - -  BMI - - -  Systolic 622 297 989  Diastolic 64 63 65  Pulse 81 79 77     1. General:  in No  Acute distress   Chronically ill -appearing 2. Psychological: Alert and  Oriented 3. Head/ENT:   Moist  Mucous Membranes                          Head Non traumatic, neck supple                          Poor Dentition 4. SKIN: normal  Skin turgor,  Skin clean Dry and intact no rash bruising of her inner thigh of the right lower extremity 5. Heart: Regular rate and rhythm no Murmur, no Rub or gallop 6. Lungs:   no wheezes or crackles  ,diminished at the bases 7. Abdomen: Soft,  non-tender, Non distended  obese  bowel sounds present 8. Lower extremities: no clubbing, cyanosis, bilateral 2+ edema 9. Neurologically Grossly intact, moving all 4 extremities equally   10. MSK: Normal range of motion    Chart has been reviewed  ______________________________________________________________________________________________  Assessment/Plan 87 y.o. male with medical history significant of HTN, DM2, A.fib on Coumadin, venous Insufficiency,    Admitted for CHF exacerbation possible PNA  Present on Admission:  Acute on chronic diastolic CHF (congestive heart failure) ([place]) -- admit on telemetry,  cycle cardiac enzymes, Troponin 11  obtain serial ECG  to evaluate for ischemia as a cause of heart failure  monitor daily weight:  Filed Weights   07/11/20 1544  Weight: 106 kg   Last BNP BNP (last 3 results) Recent Labs    07/11/20 1556  BNP 420.8*       diurese with IV lasix and monitor orthostatics and creatinine to  avoid over diuresis.  Order echogram to evaluate EF and valves  ACE/ARB Contraindicated    cardiology consulted    CAP (community acquired pneumonia) CT showing possible consolidations.  Will cover with antibiotics for tonight check procalcitonin Obtain.  Strep antigen MRSA serologies   Obesity (BMI 30-39.9) chronic stable will need nutritional assessment as an outpatient   Iron deficiency anemia due to chronic blood loss -chronic continue to follow CBC Given large bruising over right thigh and elevated INR will obtain CT of the thigh to rule out hematoma. Hold off on Coumadin for now and monitor INR. Will need to have long-term discussion of benefits versus risks of continued initiation of anticoagulation   Hyperlipidemia chronic stable continue home meds   DM2 -  - Order Sensitive  SSI     -  check TSH and HgA1C  - Hold by mouth medications    Essential hypertension continue Toprol    Chronic venous insufficiency -may be contributing to lower extremity edema   ATRIAL FIBRILLATION hold off on Coumadin given supratherapeutic INR and significant bruising.  Follow INR and restart anticoagulation if no contraindication.  And no evidence of acute bleeding.  Continue  CKD - -chronic avoid nephrotoxic medications such as NSAIDs, Vanco Zosyn combo,  avoid hypotension, continue to follow renal function    Other plan as per orders.  DVT prophylaxis:  therapeutic on coumadin     Code Status:    Code Status: Prior  DNR/DNI  e as per patient   I had personally discussed CODE STATUS with patient      Family Communication:   Family   at  Bedside  plan of care was discussed  with   Daughter   Disposition Plan:     To home once workup is complete and patient is stable   Following barriers for discharge:  Electrolytes corrected                               Anemia stable                                                           Will need to be able to  tolerate PO                            Will likely need home health, home O2, set up                           Will need consultants to evaluate patient prior to discharge    Would benefit from PT/OT eval prior to DC  Ordered                   Swallow eval - SLP ordered                                       Consults called: emailed cardiology   Admission status:  ED Disposition     ED Disposition  [place]: [place] [[id number]]  Level of Care: Telemetry [5]  Admit to tele based on following criteria: Other see comments  Comments: chf  May admit patient to [facility] or [facility] if equivalent level of care is available:: No  Covid Evaluation: Asymptomatic Screening Protocol (No Symptoms)  Diagnosis: CHF (congestive heart failure) [facility]) [[id number]]  Admitting Physician: Toy Baker [[id number]]  Attending Physician: Toy Baker [[id number]]  Estimated length of stay: past midnight tomorrow  Certification:: I certify this patient will need inpatient services for at least 2 midnights            inpatient     I Expect 2 midnight stay secondary to severity of patient's current illness need for inpatient interventions    Severe lab/radiological/exam abnormalities including:   Acute diastolic CHF exacerbation versus pneumonia and extensive comorbidities including:  DM2    CHF    Obesity  CKD  Chronic anticoagulation  That are currently affecting medical management.   I expect  patient to be hospitalized for 2 midnights requiring inpatient medical care.  Patient is at high risk for adverse outcome (such as loss of life or disability) if not treated.  Indication for inpatient stay as follows:     New or worsening hypoxia  Need for IV antibiotics, IV  diuretics    Level of care    tele  For  24H      Lab Results  Component Value Date   [place] NEGATIVE 07/11/2020     Precautions: admitted as   Covid Negative      PPE: Used by the provider:   N95  eye Goggles,  Gloves     [name] 07/12/2020, 2:13 AM    [facility]     after 2 AM please page floor coverage PA If 7AM-7PM, please contact the day team taking care of the patient using [email or web address]  Patient was evaluated in the context of the global COVID-19 pandemic, which necessitated consideration that the patient might be at risk for infection with the SARS-CoV-2 virus that causes COVID-19. Institutional protocols and algorithms that pertain to the evaluation of patients at risk for COVID-19 are in a state of rapid change based on information released by regulatory bodies including the CDC and federal and state organizations. These policies and algorithms were followed during the patient's care.

## 2020-07-11 NOTE — ED Provider Notes (Signed)
[place] DEPT Provider Note   CSN: [id number] Arrival date & time: 07/11/20  1453     History Chief Complaint  Patient presents with   Leg Swelling    [name] is a 87 y.o. male.  The history is provided by the patient, medical records and a relative. No language interpreter was used.  Illness Location:  Fatigue, exertional shortness of breath, worsening peripheral edema Severity:  Severe Onset quality:  Gradual Duration:  1 week Timing:  Constant Progression:  Worsening Chronicity:  New Associated symptoms: fatigue and shortness of breath   Associated symptoms: no abdominal pain, no chest pain, no congestion, no diarrhea, no fever, no headaches, no nausea, no rash and no vomiting       Past Medical History:  Diagnosis Date   Anemia    Atrial fibrillation (HCC)    Blood transfusion without reported diagnosis    Chronic constipation    colace helps   Chronic renal insufficiency, stage II (mild)    CrCl @60 .   Chronic venous insufficiency    Diabetes mellitus without complication (HCC)    hx of good control   Gout    Hyperlipidemia    Hypertension    Osteopenia    Stercoral ulcer of rectum    Varicose veins of both lower extremities with complications    Vitamin B12 deficiency     Patient Active Problem List   Diagnosis Date Noted   Weight loss 06/18/2020   Age-related cataract of right eye 02/63/7858   Diastolic dysfunction 85/02/7739   Obesity (BMI 30-39.9) 02/28/2018   B12 deficiency 02/28/2018   Long term (current) use of anticoagulants 09/25/2016   Chronic constipation 04/29/2015   Soft tissue mass 04/29/2015   Chronic venous insufficiency    Encounter for therapeutic drug monitoring 05/28/2014   Essential hypertension 03/07/2014   Preventative health care 03/07/2014   Varicose veins of lower extremities with other complications 28/78/6767   Diabetes mellitus without complication ([employer]) 20/94/7096   Iron  deficiency anemia due to chronic blood loss 08/26/2013   Hemorrhoid 02/19/2008   Motion sickness 01/16/2008   Swelling, mass, or lump in head and neck 11/12/2007   Hyperlipidemia 08/13/2006   GOUT 08/13/2006   ATRIAL FIBRILLATION 08/13/2006    Past Surgical History:  Procedure Laterality Date   BUNIONECTOMY  2006   COLONOSCOPY N/A 01/22/2016   Procedure: COLONOSCOPY;  Surgeon: Manus Gunning, MD;  Location: [facility];  Service: Gastroenterology;  Laterality: N/A;   HEMORRHOID SURGERY  1970   TONSILLECTOMY AND ADENOIDECTOMY  1945       Family History  Problem Relation Age of Onset   Alcohol abuse Father    Diabetes Father    Obesity Brother    Obesity Daughter        S/P gastric bypass    Social History   Tobacco Use   Smoking status: Former    Pack years: 0.00    Types: Cigarettes   Smokeless tobacco: Never  Vaping Use   Vaping Use: Never used  Substance Use Topics   Alcohol use: No   Drug use: No    Home Medications Prior to Admission medications   Medication Sig Start Date End Date Taking? Authorizing Provider  allopurinol (ZYLOPRIM) 300 MG tablet TAKE 1 TABLET (300 MG TOTAL) BY MOUTH DAILY. 02/02/20   Midge Minium, MD  Calcium Carbonate-Vit D-Min (CALCIUM 1200 PO) Take by mouth.    [provider]  lisinopril (ZESTRIL) 2.5  MG tablet TAKE 1 TABLET BY MOUTH DAILY. PLEASE MAKE APPT. THIS WILL BE THE LAST REFILL WITHOUT AN APPT 06/28/20   Midge Minium, MD  metFORMIN (GLUCOPHAGE) 500 MG tablet TAKE 1 TABLET BY MOUTH EVERY DAY 02/04/20   Midge Minium, MD  metoprolol succinate (TOPROL-XL) 50 MG 24 hr tablet TAKE 1 TABLET BY MOUTH EVERY DAY 02/23/20   Midge Minium, MD  simvastatin (ZOCOR) 20 MG tablet TAKE 1 TABLET BY MOUTH EVERY DAY 02/26/20   Midge Minium, MD  torsemide (DEMADEX) 20 MG tablet Take 1 tablet (20 mg total) by mouth daily. 06/18/20   Midge Minium, MD  vitamin B-12 (CYANOCOBALAMIN) 500 MCG tablet Take  500 mcg by mouth daily.    [provider]  warfarin (COUMADIN) 5 MG tablet TAKE AS DIRECTED BY ANTICOAGULATION CLINIC 05/17/20   Midge Minium, MD    Allergies    Influenza vac split [influenza virus vaccine]  Review of Systems   Review of Systems  Constitutional:  Positive for fatigue. Negative for chills, diaphoresis and fever.  HENT:  Negative for congestion.   Respiratory:  Positive for shortness of breath. Negative for choking and chest tightness.   Cardiovascular:  Positive for leg swelling. Negative for chest pain and palpitations.  Gastrointestinal:  Negative for abdominal pain, constipation, diarrhea, nausea and vomiting.  Genitourinary:  Positive for penile swelling and scrotal swelling. Negative for dysuria and frequency.  Musculoskeletal:  Negative for back pain, neck pain and neck stiffness.  Skin:  Negative for rash.  Neurological:  Negative for light-headedness and headaches.  Psychiatric/Behavioral:  Negative for agitation.    Physical Exam Updated Vital Signs BP (!) 144/57 (BP Location: Right Arm)   Pulse 67   Temp 97.6 F (36.4 C) (Oral)   Resp 18   Ht 5\' 11"  (1.803 m)   Wt 106 kg   SpO2 100%   BMI 32.59 kg/m   Physical Exam Vitals and nursing note reviewed. Exam conducted with a chaperone present.  Constitutional:      General: He is not in acute distress.    Appearance: He is well-developed. He is not ill-appearing, toxic-appearing or diaphoretic.  HENT:     Head: Normocephalic and atraumatic.     Nose: No congestion or rhinorrhea.  Eyes:     Conjunctiva/sclera: Conjunctivae normal.  Cardiovascular:     Rate and Rhythm: Normal rate and regular rhythm.     Heart sounds: No murmur heard. Pulmonary:     Effort: No respiratory distress.     Breath sounds: Rales present. No wheezing or rhonchi.  Chest:     Chest wall: No tenderness.  Abdominal:     General: There is distension.     Palpations: Abdomen is soft.     Tenderness: There  is no abdominal tenderness. There is no right CVA tenderness, guarding or rebound.  Genitourinary:    Comments: Edema but no tenderness or hernia palpated Musculoskeletal:        General: Swelling present.     Cervical back: Neck supple. No tenderness.     Right lower leg: Edema present.     Left lower leg: Edema present.  Skin:    General: Skin is warm and dry.     Capillary Refill: Capillary refill takes less than 2 seconds.     Findings: No erythema.  Neurological:     General: No focal deficit present.     Mental Status: He is alert.  Sensory: No sensory deficit.     Motor: No weakness.  Psychiatric:        Mood and Affect: Mood normal.    ED Results / Procedures / Treatments   Labs (all labs ordered are listed, but only abnormal results are displayed) Labs Reviewed  BRAIN NATRIURETIC PEPTIDE - Abnormal; Notable for the following components:      Result Value   B Natriuretic Peptide 420.8 (*)    All other components within normal limits  COMPREHENSIVE METABOLIC PANEL - Abnormal; Notable for the following components:   Creatinine, Ser 1.74 (*)    Calcium 8.6 (*)    Albumin 2.9 (*)    Total Bilirubin 2.4 (*)    GFR, Estimated 37 (*)    All other components within normal limits  PROTIME-INR - Abnormal; Notable for the following components:   Prothrombin Time 37.6 (*)    INR 3.8 (*)    All other components within normal limits  CBC WITH DIFFERENTIAL/PLATELET - Abnormal; Notable for the following components:   RBC 3.86 (*)    Hemoglobin 11.9 (*)    HCT 37.1 (*)    RDW 17.4 (*)    All other components within normal limits  RESP PANEL BY RT-PCR (FLU A&B, COVID) ARPGX2  HEMOGLOBIN A1C  PREALBUMIN  MAGNESIUM  PHOSPHORUS  CBC WITH DIFFERENTIAL/PLATELET  TSH  COMPREHENSIVE METABOLIC PANEL  VITAMIN W09  FOLATE  IRON AND TIBC  FERRITIN  RETICULOCYTES  TROPONIN I (HIGH SENSITIVITY)  TROPONIN I (HIGH SENSITIVITY)    EKG EKG Interpretation  Date/Time:  Sunday  July 11 2020 16:31:51 EDT Ventricular Rate:  63 PR Interval:    QRS Duration: 84 QT Interval:  426 QTC Calculation: 435 R Axis:   78 Text Interpretation: Atrial fibrillation Cannot rule out Anterior infarct , age undetermined Abnormal ECG No prior ECG for comparison. No STEMI Confirmed by Antony Blackbird [phone]) on 07/11/2020 4:56:22 PM  Radiology DG Chest 2 View  Result Date: 07/11/2020 CLINICAL DATA:  Four weeks of lower extremity swelling and edema. Shortness of breath on exertion. EXAM: CHEST - 2 VIEW COMPARISON:  CT abdomen January 21, 2016. FINDINGS: The heart size and mediastinal contours are partially obscured. Aortic atherosclerosis. Moderate hiatal hernia. Moderate left pleural effusion with adjacent parenchymal consolidation versus atelectasis. Right lung is predominantly clear. Prior lower thoracic vertebral augmentation. IMPRESSION: Moderate left pleural effusion with adjacent parenchymal consolidation versus atelectasis. Electronically Signed   By: Dahlia Bailiff MD   On: 07/11/2020 16:16   VAS [place] LOWER EXTREMITY VENOUS (DVT) ([facility] and [facility] 7a-7p)  Result Date: 07/11/2020  Lower Venous DVT Study Patient Name:  CANON GOLA  Date of Exam:   07/11/2020 Medical Rec #: [id number]       Accession #:    [id number] Date of Birth: 1931/11/25       Patient Gender: M Patient Age:   89Y Exam Location:  [facility] Procedure:      VAS [place] LOWER EXTREMITY VENOUS (DVT) Referring Phys: [id number] [place] --------------------------------------------------------------------------------  Indications: Swelling, Edema, and Pain.  Limitations: Poor ultrasound/tissue interface. Comparison Study: no prior Performing Technologist: Archie Patten RVS  Examination Guidelines: A complete evaluation includes B-mode imaging, spectral Doppler, color Doppler, and power Doppler as needed of all accessible portions of each vessel. Bilateral testing is considered an integral part of a complete examination.  Limited examinations for reoccurring indications may be performed as noted. The reflux portion of the exam is performed with the patient in  reverse Trendelenburg.  +---------+---------------+---------+-----------+----------+-------------------+ RIGHT    CompressibilityPhasicitySpontaneityPropertiesThrombus Aging      +---------+---------------+---------+-----------+----------+-------------------+ CFV      Full           Yes      Yes                                      +---------+---------------+---------+-----------+----------+-------------------+ SFJ      Full                                                             +---------+---------------+---------+-----------+----------+-------------------+ FV Prox  Full                                                             +---------+---------------+---------+-----------+----------+-------------------+ FV Mid                  Yes      Yes                                      +---------+---------------+---------+-----------+----------+-------------------+ FV Distal               Yes      Yes                                      +---------+---------------+---------+-----------+----------+-------------------+ PFV      Full                                                             +---------+---------------+---------+-----------+----------+-------------------+ POP      Full           Yes      Yes                                      +---------+---------------+---------+-----------+----------+-------------------+ PTV      Full                                                             +---------+---------------+---------+-----------+----------+-------------------+ PERO                                                  Not well visualized +---------+---------------+---------+-----------+----------+-------------------+   +---------+---------------+---------+-----------+----------+-------------------+  LEFT     CompressibilityPhasicitySpontaneityPropertiesThrombus Aging      +---------+---------------+---------+-----------+----------+-------------------+  CFV      Full           Yes      Yes                                      +---------+---------------+---------+-----------+----------+-------------------+ SFJ      Full                                                             +---------+---------------+---------+-----------+----------+-------------------+ FV Prox  Full                                                             +---------+---------------+---------+-----------+----------+-------------------+ FV Mid   Full                                                             +---------+---------------+---------+-----------+----------+-------------------+ FV DistalFull                                                             +---------+---------------+---------+-----------+----------+-------------------+ PFV      Full                                                             +---------+---------------+---------+-----------+----------+-------------------+ POP      Full           Yes      Yes                                      +---------+---------------+---------+-----------+----------+-------------------+ PTV      Full                                                             +---------+---------------+---------+-----------+----------+-------------------+ PERO                                                  Not well visualized +---------+---------------+---------+-----------+----------+-------------------+    Summary: RIGHT: - Findings suggest new clot progression as compared to previous examination. - There is no evidence of deep vein  thrombosis in the lower extremity. However, portions of this examination were limited- see technologist comments above.  - No cystic structure found in the popliteal fossa.  LEFT: - There is no  evidence of deep vein thrombosis in the lower extremity.  - No cystic structure found in the popliteal fossa.  *See table(s) above for measurements and observations.    Preliminary     Procedures Procedures   Medications Ordered in ED Medications  insulin aspart (novoLOG) injection 0-9 Units (1 Units Subcutaneous Given 07/11/20 2359)  allopurinol (ZYLOPRIM) tablet 300 mg (has no administration in time range)  metoprolol succinate (TOPROL-XL) 24 hr tablet 50 mg (has no administration in time range)  simvastatin (ZOCOR) tablet 20 mg (has no administration in time range)  acetaminophen (TYLENOL) tablet 650 mg (has no administration in time range)    Or  acetaminophen (TYLENOL) suppository 650 mg (has no administration in time range)  HYDROcodone-acetaminophen (NORCO/VICODIN) 5-325 MG per tablet 1-2 tablet (has no administration in time range)  furosemide (LASIX) injection 40 mg (40 mg Intravenous Given 07/11/20 2349)  sodium chloride flush (NS) 0.9 % injection 3 mL (3 mLs Intravenous Given 07/12/20 0003)  sodium chloride flush (NS) 0.9 % injection 3 mL (has no administration in time range)  0.9 %  sodium chloride infusion (has no administration in time range)  docusate sodium (COLACE) capsule 100 mg (100 mg Oral Given 07/11/20 2350)  iohexol (OMNIPAQUE) 350 MG/ML injection 70 mL (70 mLs Intravenous Contrast Given 07/11/20 1903)    ED Course  I have reviewed the triage vital signs and the nursing notes.  Pertinent labs & imaging results that were available during my care of the patient were reviewed by me and considered in my medical decision making (see chart for details).    MDM Rules/Calculators/A&P                          LEORY ALLINSON is a 87 y.o. male with a past medical history significant for hypertension, hyperlipidemia, diabetes, atrial fibrillation on Coumadin therapy, diastolic dysfunction on diuretic, obesity, and recent diagnosis of right-sided DVT who presents with worsening  peripheral edema, fatigue, and now exertional and positional shortness of breath.  Patient reports that he has not had shortness of breath in the past with this but over the last few days has had worsened breathing.  He reports he is getting winded doing anything.  He reports that his legs are having worsening edema and now he is very edematous in his lower abdomen and his scrotum.  He is denying any pain in his testicles or pelvis area.  He reports no chest pain but reports he cannot catch his breath at times.  He denies any fevers, chills, congestion or cough.  He denies any recent chest injury.  On exam, patient has near anasarca in his legs, scrotum with a chaperone, and abdomen.  Scrotum and testicles are nontender.  Did not appreciate any hernias on exam.  Legs are extremely edematous and patient has bruising in his right upper thigh.  No tenderness on his back.  Patient has rales in the bases bilaterally with some diminished breath sounds.  Chest was nontender.  Abdomen nontender.  Patient is not hypoxic at rest.  Clinically I am concerned about worsening heart failure despite needed and increased diuretic use.  I am also concerned for possible PE given his known DVT and the new shortness of breath symptoms.    Patient  had some labs and x-ray in triage which show some progression of his clot in the right leg.  X-ray also shows either some consolidation and pleural effusion.  Patient does have elevated BNP at 420 which is new.  Kidney function is worsening with his diuretic use.  Given this context, will get a CT PE study to further evaluate, will ambulate with pulse oximetry, and will check for COVID.  Anticipate reassessment to determine disposition.     Patient was ambulated and oxygen saturation dropped from upper 90s down to 90% and he was short of breath.  CT scan does not show evidence of pulmonary realism but does show pleural effusions and consolidation.  Given his lack of cough, I still  do not suspect pneumonia at this time but I am concerned about worsening fluid.  Given the increasing edema, exertional shortness of breath with near hypoxia, and his failure of outpatient diuresis with worsening kidney function, I am concerned about him going home.  Patient is uncomfortable with him going home as well.  Patient will be admitted for renal monitoring in the setting of diuresis and worsening heart failure causing exertional shortness of breath.   Final Clinical Impression(s) / ED Diagnoses Final diagnoses:  Peripheral edema  Exertional shortness of breath    Clinical Impression: 1. Peripheral edema   2. Exertional shortness of breath     Disposition: Admit  This note was prepared with assistance of Dragon voice recognition software. Occasional wrong-word or sound-a-like substitutions may have occurred due to the inherent limitations of voice recognition software.     Doristine Shehan, Gwenyth Allegra, MD 07/12/20 [phone]

## 2020-07-11 NOTE — ED Provider Notes (Signed)
Emergency Medicine Provider Triage Evaluation Note  [name] , a 87 y.o. male  was evaluated in triage.  Pt complains of shortness of breath, lower extremity swelling, fatigue has been ongoing for approximately 4 weeks.  He endorses shortness of breath with exertion denies chest pain.  He states that he has also been short of breath when he lays down at night Seems of been recently started on a diuretic.  Also was recently diagnosed with a DVT in his right leg.  Is on Coumadin for A. fib.  According to the ER  Review of Systems  Positive: Shortness of breath, leg swelling, orthopnea  Negative: CP  Physical Exam  BP (!) 144/57 (BP Location: Right Arm)   Pulse 67   Temp 97.6 F (36.4 C) (Oral)   Resp 18   Ht 5\' 11"  (1.803 m)   Wt 106 kg   SpO2 100%   BMI 32.59 kg/m  Gen:   Awake, no distress   Resp:  Normal effort  MSK:   Moves extremities without difficulty  Other:  Severe edema that is pitting to bilateral lower extremities roughly symmetric  Medical Decision Making  Medically screening exam initiated at 3:48 PM.  Appropriate orders placed.  [name] was informed that the remainder of the evaluation will be completed by another provider, this initial triage assessment does not replace that evaluation, and the importance of remaining in the ED until their evaluation is complete.  Patient appears significantly fluid overloaded.  Will obtain BNP, troponin, basic labs, INR since he is on Coumadin will obtain chest x-ray EKG and bilateral lower extremity ultrasounds given that he was recently diagnosed with DVT seems to be a very poor historian have poor memory and I am concerned that he is not taking his Coumadin as directed.  INR will be checked as well.   [name], [place] 07/11/20 1552    Stephen Bo, MD 07/11/20 2231

## 2020-07-11 NOTE — ED Triage Notes (Signed)
BIB daughter, complains of lower extremity swelling x4 weeks and edema, as well as testicles. Patient endorses SOB w/ exertion and denies CP. Fall recently.

## 2020-07-11 NOTE — Progress Notes (Signed)
Lower extremity venous has been completed.   Preliminary results in CV Proc.   Abram Sander 07/11/2020 4:21 PM

## 2020-07-12 ENCOUNTER — Encounter (HOSPITAL_COMMUNITY): Payer: Self-pay | Admitting: Internal Medicine

## 2020-07-12 ENCOUNTER — Inpatient Hospital Stay (HOSPITAL_COMMUNITY): Payer: Medicare HMO

## 2020-07-12 DIAGNOSIS — I340 Nonrheumatic mitral (valve) insufficiency: Secondary | ICD-10-CM

## 2020-07-12 DIAGNOSIS — I361 Nonrheumatic tricuspid (valve) insufficiency: Secondary | ICD-10-CM | POA: Diagnosis not present

## 2020-07-12 DIAGNOSIS — R0609 Other forms of dyspnea: Secondary | ICD-10-CM

## 2020-07-12 DIAGNOSIS — I5033 Acute on chronic diastolic (congestive) heart failure: Secondary | ICD-10-CM | POA: Diagnosis present

## 2020-07-12 DIAGNOSIS — I509 Heart failure, unspecified: Secondary | ICD-10-CM

## 2020-07-12 DIAGNOSIS — I351 Nonrheumatic aortic (valve) insufficiency: Secondary | ICD-10-CM

## 2020-07-12 DIAGNOSIS — N1832 Chronic kidney disease, stage 3b: Secondary | ICD-10-CM

## 2020-07-12 DIAGNOSIS — K761 Chronic passive congestion of liver: Secondary | ICD-10-CM

## 2020-07-12 DIAGNOSIS — J189 Pneumonia, unspecified organism: Secondary | ICD-10-CM | POA: Insufficient documentation

## 2020-07-12 DIAGNOSIS — S7011XA Contusion of right thigh, initial encounter: Secondary | ICD-10-CM

## 2020-07-12 DIAGNOSIS — N179 Acute kidney failure, unspecified: Secondary | ICD-10-CM

## 2020-07-12 LAB — CBC WITH DIFFERENTIAL/PLATELET
Abs Immature Granulocytes: 0.02 10*3/uL (ref 0.00–0.07)
Basophils Absolute: 0 10*3/uL (ref 0.0–0.1)
Basophils Relative: 1 %
Eosinophils Absolute: 0.2 10*3/uL (ref 0.0–0.5)
Eosinophils Relative: 3 %
HCT: 33.7 % — ABNORMAL LOW (ref 39.0–52.0)
Hemoglobin: 11 g/dL — ABNORMAL LOW (ref 13.0–17.0)
Immature Granulocytes: 0 %
Lymphocytes Relative: 16 %
Lymphs Abs: 0.9 10*3/uL (ref 0.7–4.0)
MCH: 31.2 pg (ref 26.0–34.0)
MCHC: 32.6 g/dL (ref 30.0–36.0)
MCV: 95.5 fL (ref 80.0–100.0)
Monocytes Absolute: 0.7 10*3/uL (ref 0.1–1.0)
Monocytes Relative: 13 %
Neutro Abs: 4 10*3/uL (ref 1.7–7.7)
Neutrophils Relative %: 67 %
Platelets: 300 10*3/uL (ref 150–400)
RBC: 3.53 MIL/uL — ABNORMAL LOW (ref 4.22–5.81)
RDW: 17.7 % — ABNORMAL HIGH (ref 11.5–15.5)
WBC: 5.8 10*3/uL (ref 4.0–10.5)
nRBC: 0 % (ref 0.0–0.2)

## 2020-07-12 LAB — ECHOCARDIOGRAM COMPLETE
AR max vel: 2.3 cm2
AV Area VTI: 2.49 cm2
AV Area mean vel: 2.2 cm2
AV Mean grad: 11.7 mmHg
AV Peak grad: 19.9 mmHg
Ao pk vel: 2.23 m/s
Height: 71 in
P 1/2 time: 351 msec
S' Lateral: 2.9 cm
Weight: 3739 oz

## 2020-07-12 LAB — COMPREHENSIVE METABOLIC PANEL
ALT: 11 U/L (ref 0–44)
AST: 23 U/L (ref 15–41)
Albumin: 3 g/dL — ABNORMAL LOW (ref 3.5–5.0)
Alkaline Phosphatase: 93 U/L (ref 38–126)
Anion gap: 8 (ref 5–15)
BUN: 26 mg/dL — ABNORMAL HIGH (ref 8–23)
CO2: 28 mmol/L (ref 22–32)
Calcium: 8.7 mg/dL — ABNORMAL LOW (ref 8.9–10.3)
Chloride: 104 mmol/L (ref 98–111)
Creatinine, Ser: 1.54 mg/dL — ABNORMAL HIGH (ref 0.61–1.24)
GFR, Estimated: 43 mL/min — ABNORMAL LOW (ref >60)
Glucose, Bld: 106 mg/dL — ABNORMAL HIGH (ref 70–99)
Potassium: 3.8 mmol/L (ref 3.5–5.1)
Sodium: 140 mmol/L (ref 135–145)
Total Bilirubin: 2.5 mg/dL — ABNORMAL HIGH (ref 0.3–1.2)
Total Protein: 6.5 g/dL (ref 6.5–8.1)

## 2020-07-12 LAB — TSH: TSH: 2.179 u[IU]/mL (ref 0.350–4.500)

## 2020-07-12 LAB — VITAMIN B12: Vitamin B-12: 1285 pg/mL — ABNORMAL HIGH (ref 180–914)

## 2020-07-12 LAB — CBG MONITORING, ED: Glucose-Capillary: 96 mg/dL (ref 70–99)

## 2020-07-12 LAB — GLUCOSE, CAPILLARY
Glucose-Capillary: 94 mg/dL (ref 70–99)
Glucose-Capillary: 138 mg/dL — ABNORMAL HIGH (ref 70–99)
Glucose-Capillary: 145 mg/dL — ABNORMAL HIGH (ref 70–99)
Glucose-Capillary: 110 mg/dL — ABNORMAL HIGH (ref 70–99)
Glucose-Capillary: 132 mg/dL — ABNORMAL HIGH (ref 70–99)

## 2020-07-12 LAB — FERRITIN: Ferritin: 72 ng/mL (ref 24–336)

## 2020-07-12 LAB — PROTIME-INR
INR: 3.6 — ABNORMAL HIGH (ref 0.8–1.2)
Prothrombin Time: 36.1 seconds — ABNORMAL HIGH (ref 11.4–15.2)

## 2020-07-12 LAB — URINALYSIS, ROUTINE W REFLEX MICROSCOPIC
Bacteria, UA: NONE SEEN
Bilirubin Urine: NEGATIVE
Glucose, UA: NEGATIVE mg/dL
Hgb urine dipstick: NEGATIVE
Ketones, ur: NEGATIVE mg/dL
Leukocytes,Ua: NEGATIVE
Nitrite: NEGATIVE
Protein, ur: NEGATIVE mg/dL
Specific Gravity, Urine: 1.012 (ref 1.005–1.030)
pH: 6 (ref 5.0–8.0)

## 2020-07-12 LAB — PROCALCITONIN: Procalcitonin: <0.1 ng/mL

## 2020-07-12 LAB — IRON AND TIBC
Iron: 95 ug/dL (ref 45–182)
Saturation Ratios: 35 % (ref 17.9–39.5)
TIBC: 268 ug/dL (ref 250–450)
UIBC: 173 ug/dL

## 2020-07-12 LAB — PHOSPHORUS: Phosphorus: 2.7 mg/dL (ref 2.5–4.6)

## 2020-07-12 LAB — MAGNESIUM: Magnesium: 2.2 mg/dL (ref 1.7–2.4)

## 2020-07-12 LAB — RETICULOCYTES
Immature Retic Fract: 13.9 % (ref 2.3–15.9)
RBC.: 3.59 MIL/uL — ABNORMAL LOW (ref 4.22–5.81)
Retic Count, Absolute: 66.8 10*3/uL (ref 19.0–186.0)
Retic Ct Pct: 1.9 % (ref 0.4–3.1)

## 2020-07-12 LAB — HEMOGLOBIN A1C
Hgb A1c MFr Bld: 5.6 % (ref 4.8–5.6)
Mean Plasma Glucose: 114.02 mg/dL

## 2020-07-12 LAB — PREALBUMIN: Prealbumin: 5.3 mg/dL — ABNORMAL LOW (ref 18–38)

## 2020-07-12 LAB — FOLATE: Folate: 6.3 ng/mL (ref >5.9)

## 2020-07-12 MED ORDER — INSULIN ASPART 100 UNIT/ML IJ SOLN: 0.0000 [IU] | Freq: Every day | INTRAMUSCULAR | Status: DC

## 2020-07-12 MED ORDER — FOLIC ACID 1 MG PO TABS
1.0000 mg | ORAL_TABLET | Freq: Every day | ORAL | Status: DC
  Administered 2020-07-12 – 2020-07-21 (×10): 1 mg via ORAL
  Filled 2020-07-12 (×10): qty 1

## 2020-07-12 MED ORDER — SODIUM CHLORIDE 0.9 % IV SOLN
500.0000 mg | INTRAVENOUS | Status: DC
  Administered 2020-07-12: 500 mg via INTRAVENOUS
  Filled 2020-07-12: qty 500

## 2020-07-12 MED ORDER — FUROSEMIDE 10 MG/ML IJ SOLN
80.0000 mg | Freq: Two times a day (BID) | INTRAMUSCULAR | Status: DC
  Administered 2020-07-12 – 2020-07-20 (×16): 80 mg via INTRAVENOUS
  Filled 2020-07-12 (×16): qty 8

## 2020-07-12 MED ORDER — INSULIN ASPART 100 UNIT/ML IJ SOLN
0.0000 [IU] | Freq: Three times a day (TID) | INTRAMUSCULAR | Status: DC
  Administered 2020-07-16: 5 [IU] via SUBCUTANEOUS

## 2020-07-12 MED ORDER — CEFTRIAXONE SODIUM 2 G IJ SOLR
2.0000 g | INTRAMUSCULAR | Status: DC
  Administered 2020-07-12: 2 g via INTRAVENOUS
  Filled 2020-07-12: qty 20

## 2020-07-12 MED ORDER — ALBUMIN HUMAN 25 % IV SOLN
25.0000 g | Freq: Once | INTRAVENOUS | Status: AC
  Administered 2020-07-12: 25 g via INTRAVENOUS
  Filled 2020-07-12: qty 100

## 2020-07-12 NOTE — Assessment & Plan Note (Signed)
-   suspect elevated Tbili consistent with congestive hepatopathy - increased mortality associated

## 2020-07-12 NOTE — Progress Notes (Signed)
Nutrition Brief Note  Consult received for assessment of nutrition requirements and status. MD note from the ED indicates nutrition consult as patient will need outpatient services d/t obesity. No acute inpatient needs.   Wt Readings from Last 15 Encounters:  07/11/20 106 kg  06/18/20 106.2 kg  07/08/19 97.5 kg  03/21/19 100.2 kg  03/14/19 99.8 kg  02/24/19 101.8 kg  01/28/19 105.2 kg  01/22/19 105.7 kg  11/14/18 103.9 kg  10/31/18 107.3 kg  06/25/18 104.8 kg  05/20/18 104.9 kg  02/28/18 102.7 kg  02/11/18 108.4 kg  09/28/17 106.3 kg    Body mass index is 32.59 kg/m. Patient meets criteria for obesity based on current BMI. Skin tear to L elbow.   Current diet order is Carb Modified and he ate 100% of breakfast this AM.  Labs (HgbA1c: 5.6%) and medications reviewed.   No nutrition interventions warranted at this time. If nutrition issues arise, please consult RD. If outpatient RD services are desired, please place referral for outpatient.      Jarome Matin, MS, RD, LDN, CNSC Inpatient Clinical Dietitian RD pager # available in [place]  After hours/weekend pager # available in [facility]

## 2020-07-12 NOTE — Assessment & Plan Note (Signed)
-   follows outpatient with wound center

## 2020-07-12 NOTE — Consult Note (Addendum)
Cardiology Consultation:   Patient ID: [name] MRN: [id number]; DOB: 08/29/1931  Admit date: 07/11/2020 Date of Consult: 07/12/2020  PCP:  Midge Minium, MD   [facility] Providers Cardiologist:  Sinclair Grooms, MD        Patient Profile:   [name] is a 87 y.o. male with a hx of chronic diastolic CHF, chronic atrial fibrillation, moderate AI by echo 11/2018, anemia, CKD stage III (borderline a-b, baseline Cr appears 1.5-1.9), varicose veins/chronic venous insufficiency followed by wound clinic, DM, HTN, HLD, stercoral ulcer of rectum, vitamin B 12 deficiency who is being seen 07/12/2020 for the evaluation of CHF at the request of Dr. Roel Cluck.  History of Present Illness:   [name] has primarily been managed in the primary care setting but did establish care with Dr. Tamala Julian for one visit in January 2021. At the time he was felt to be in chronic atrial fib. He has been on Coumadin through primary care. Last Coumadin check was in 04/2020. Recheck INR was scheduled in May but patient appears to have cancelled this visit. There were remote notes that he was looking into DOAC at the [place] several years ago, but he states at the time it was cost prohibitive. He remotely saw [facility] several years ago after having a GI bleed in 2018 with question of cirrhosis on CT. He had FFP to reverse Coumadin after his bleeding persisted and he underwent a colonoscopy which showed a few small polyps but large solitary rectal ulcer with visible vessel which was cauterized. Per GI note, he had an [place] with elastography on 01/2016 showing normal appearing liver and spleen, but elastography with F3-F4 fibrosis which raised the question of cirrhosis. F/u liver US 07/2016 did not show any obvious abnormality but did show mild enlargement of spleen. Ongoing follow-up was suggested later that year but do not see this occurred. Last 2D echo 11/2018 showed EF 60-65%, severely dilated LA, mildly dilated RA,  mild MR, moderate AI, mild aortic sclerosis without stenosis, mildly elevated PASP. He saw PCP in 06/2020 for increased lower extremity and scrotal edema at which time furosemide was changed to torsemide. He was referred to urology for his testicular swelling who did not advice any specific intervention but was recommended to consider diuretic increase. Cardiology follow-up was recommended. He also recently had a trip and fall about 2 weeks ago waking out of bed after a vivid dream having to go to the bathroom and his rug slipped.  His swelling continued to worsen and he was brought into the ER 07/11/2020 with increasing SOB, marked lower extremity edema, and testicular swelling. The patient states someone previously looked at his legs as an outpatient and pointed to a spot and told him he had a blood clot there but he denies ever having an [place] prior to admission or any formal diagnostic w/u for DVT. Here, LE venous duplex here was negative although portions of exam were limited. Labs reveal Cr 1.74 -> 1.54, hypoalbuminemia of 2.9, BNP 420, negative troponin x 2, elevated bilirubin (higher than prior values), negative procalcitonin, mild anemia with Hgb down to 11.0, presenting INR 3.8, TSH wnl, Covid neg. CXR showed moderate L pleural effusion with adjacent consolidation vs atx. CT angio showed L>R bilateral pleural effusions with associated consolidation, hiatal hernia, no evidence of PE, + aortic atherosclerosis. He has significant bruising to his right leg and CT of the lower right extremity showed marked anasarca and high density mass in  the medial compartment right thigh likely hematoma 8x3cm, nonspecific appearance, consider f/u in few months to ensure resolution and exclude mass. He has been started on IV Lasix. Warfarin on hold due to hematoma.   Past Medical History:  Diagnosis Date   Abnormal finding on imaging of liver    Anemia    Blood transfusion without reported diagnosis    Chronic atrial  fibrillation (HCC)    Chronic constipation    colace helps   Chronic diastolic CHF (congestive heart failure) (HCC)    Chronic venous insufficiency    CKD (chronic kidney disease), stage III (HCC)    Diabetes mellitus ([place])    Gout    Hyperlipidemia    Hypertension    Osteopenia    Stercoral ulcer of rectum    Varicose veins of both lower extremities with complications    Vitamin B12 deficiency     Past Surgical History:  Procedure Laterality Date   BUNIONECTOMY  2006   COLONOSCOPY N/A 01/22/2016   Procedure: COLONOSCOPY;  Surgeon: Manus Gunning, MD;  Location: [facility];  Service: Gastroenterology;  Laterality: N/A;   HEMORRHOID SURGERY  1970   TONSILLECTOMY AND ADENOIDECTOMY  1945     Home Medications:  Prior to Admission medications   Medication Sig Start Date End Date Taking? Authorizing Provider  allopurinol (ZYLOPRIM) 300 MG tablet TAKE 1 TABLET (300 MG TOTAL) BY MOUTH DAILY. 02/02/20  Yes Midge Minium, MD  Calcium Carbonate-Vit D-Min (CALCIUM 1200 PO) Take by mouth.   Yes [provider]  lisinopril (ZESTRIL) 2.5 MG tablet TAKE 1 TABLET BY MOUTH DAILY. PLEASE MAKE APPT. THIS WILL BE THE LAST REFILL WITHOUT AN APPT 06/28/20  Yes Midge Minium, MD  metFORMIN (GLUCOPHAGE) 500 MG tablet TAKE 1 TABLET BY MOUTH EVERY DAY 02/04/20  Yes Midge Minium, MD  metoprolol succinate (TOPROL-XL) 50 MG 24 hr tablet TAKE 1 TABLET BY MOUTH EVERY DAY 02/23/20  Yes Midge Minium, MD  simvastatin (ZOCOR) 20 MG tablet TAKE 1 TABLET BY MOUTH EVERY DAY 02/26/20  Yes Midge Minium, MD  torsemide (DEMADEX) 20 MG tablet Take 1 tablet (20 mg total) by mouth daily. 06/18/20  Yes Midge Minium, MD  vitamin B-12 (CYANOCOBALAMIN) 500 MCG tablet Take 500 mcg by mouth daily.   Yes [provider]  warfarin (COUMADIN) 5 MG tablet TAKE AS DIRECTED BY ANTICOAGULATION CLINIC 05/17/20  Yes Midge Minium, MD    Inpatient Medications: Scheduled  Meds:  allopurinol  300 mg Oral Daily   docusate sodium  100 mg Oral BID   furosemide  40 mg Intravenous Q12H   insulin aspart  0-9 Units Subcutaneous Q4H   metoprolol succinate  50 mg Oral Daily   simvastatin  20 mg Oral Daily   sodium chloride flush  3 mL Intravenous Q12H   Continuous Infusions:  sodium chloride     PRN Meds: sodium chloride, acetaminophen **OR** acetaminophen, HYDROcodone-acetaminophen, sodium chloride flush  Allergies:    Allergies  Allergen Reactions   Influenza Vac Split [Influenza Virus Vaccine] Nausea And Vomiting    Social History:   Social History   Socioeconomic History   Marital status: Married    Spouse name: Not on file   Number of children: 3   Years of education: Not on file   Highest education level: Not on file  Occupational History   Occupation: retired  Tobacco Use   Smoking status: Former    Pack years: 0.00  Types: Cigarettes   Smokeless tobacco: Never  Vaping Use   Vaping Use: Never used  Substance and Sexual Activity   Alcohol use: No   Drug use: No   Sexual activity: Not on file  Other Topics Concern   Not on file  Social History Narrative   Married, 3 daughters, 4 grandchildren.      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   Retired 2012.      Remote smoking hx: quit 55 yrs ago.  No alcohol in 55 yrs.      Social Determinants of Health   Financial Resource Strain: Not on file  Food Insecurity: Not on file  Transportation Needs: Not on file  Physical Activity: Not on file  Stress: Not on file  Social Connections: Not on file  Intimate Partner Violence: Not on file    Family History:   Family History  Problem Relation Age of Onset   Alcohol abuse Father    Diabetes Father    Heart disease Father    Obesity Brother    Obesity Daughter        S/P gastric bypass     ROS:  Please see the history of present illness.  All other ROS reviewed  and negative.     Physical Exam/Data:   Vitals:   07/11/20 2100 07/11/20 2200 07/12/20 0225 07/12/20 0521  BP: (!) 163/63 (!) 144/64 (!) 114/55 (!) 133/56  Pulse: 79 81 79 81  Resp: 18 19 20 20   Temp:    98.1 F (36.7 C)  TempSrc:    Oral  SpO2: 99% 97% 97% 100%  Weight:      Height:        Intake/Output Summary (Last 24 hours) at 07/12/2020 0839 Last data filed at 07/12/2020 0700 Gross per 24 hour  Intake 120 ml  Output --  Net 120 ml   Last 3 Weights 07/11/2020 06/18/2020 07/08/2019  Weight (lbs) 233 lb 11 oz 234 lb 3.2 oz 215 lb  Weight (kg) 106 kg 106.232 kg 97.523 kg     Body mass index is 32.59 kg/m.  General: Well developed, well nourished WM, in no acute distress. Head: Normocephalic, atraumatic, sclera non-icteric, no xanthomas, nares are without discharge. Neck: Negative for carotid bruits. JVP not elevated. Lungs: Decreased BS at bases L>R without wheezing, rales or rhonchi Heart: Irregularly irregular rhythm with controlled rate, S1 S2 without murmurs, rubs, or gallops.  Abdomen: Soft, rounded with normoactive bowel sounds. No rebound/guarding. Extremities: No clubbing or cyanosis. 3+ pitting BLE edema and scrotal edema Neuro: Alert and oriented X 3. Moves all extremities spontaneously. Psych:  Responds to questions appropriately with a normal affect.   EKG:  The EKG was personally reviewed and demonstrates:  atrial fib 63bpm, somewhat low voltage but otheriwse no acute STT changes Telemetry:  Telemetry was personally reviewed and demonstrates:  rate controlled afib  Relevant CV Studies: 2D echo 11/2018   1. Left ventricular ejection fraction, by visual estimation, is 60 to  65%. The left ventricle has normal function. There is mildly increased  left ventricular hypertrophy.   2. Global right ventricle has normal systolic function.The right  ventricular size is normal. No increase in right ventricular wall  thickness.   3. Left atrial size was severely  dilated.   4. Right atrial size was mildly dilated.   5. Mild mitral annular calcification.   6. The mitral valve is abnormal. Mild mitral  valve regurgitation.   7. The tricuspid valve is normal in structure. Tricuspid valve  regurgitation is mild.   8. Aortic valve regurgitation is moderate.   9. The aortic valve is tricuspid. Aortic valve regurgitation is moderate.  Mild aortic valve sclerosis without stenosis.  10. The pulmonic valve was normal in structure. Pulmonic valve  regurgitation is not visualized.  11. Mildly elevated pulmonary artery systolic pressure.   Laboratory Data:  High Sensitivity Troponin:   Recent Labs  Lab 07/11/20 1556 07/11/20 1749  TROPONINIHS 11 11     Chemistry Recent Labs  Lab 07/11/20 1556 07/12/20 0427  NA 137 140  K 4.4 3.8  CL 103 104  CO2 29 28  GLUCOSE 97 106*  BUN 23 26*  CREATININE 1.74* 1.54*  CALCIUM 8.6* 8.7*  GFRNONAA 37* 43*  ANIONGAP 5 8    Recent Labs  Lab 07/11/20 1556 07/12/20 0427  PROT 6.5 6.5  ALBUMIN 2.9* 3.0*  AST 22 23  ALT 11 11  ALKPHOS 93 93  BILITOT 2.4* 2.5*   Hematology Recent Labs  Lab 07/11/20 1749 07/12/20 0427  WBC 6.0 5.8  RBC 3.86* 3.53*  3.59*  HGB 11.9* 11.0*  HCT 37.1* 33.7*  MCV 96.1 95.5  MCH 30.8 31.2  MCHC 32.1 32.6  RDW 17.4* 17.7*  PLT 305 300   BNP Recent Labs  Lab 07/11/20 1556  BNP 420.8*    DDimer No results for input(s): DDIMER in the last 168 hours.   Radiology/Studies:  DG Chest 2 View  Result Date: 07/11/2020 CLINICAL DATA:  Four weeks of lower extremity swelling and edema. Shortness of breath on exertion. EXAM: CHEST - 2 VIEW COMPARISON:  CT abdomen January 21, 2016. FINDINGS: The heart size and mediastinal contours are partially obscured. Aortic atherosclerosis. Moderate hiatal hernia. Moderate left pleural effusion with adjacent parenchymal consolidation versus atelectasis. Right lung is predominantly clear. Prior lower thoracic vertebral augmentation.  IMPRESSION: Moderate left pleural effusion with adjacent parenchymal consolidation versus atelectasis. Electronically Signed   By: Dahlia Bailiff MD   On: 07/11/2020 16:16   CT Angio Chest PE W and/or Wo Contrast  Result Date: 07/11/2020 CLINICAL DATA:  Increasing shortness of breath EXAM: CT ANGIOGRAPHY CHEST WITH CONTRAST TECHNIQUE: Multidetector CT imaging of the chest was performed using the standard protocol during bolus administration of intravenous contrast. Multiplanar CT image reconstructions and MIPs were obtained to evaluate the vascular anatomy. CONTRAST:  68mL OMNIPAQUE IOHEXOL 350 MG/ML SOLN COMPARISON:  Chest x-ray from earlier in the same day. FINDINGS: Cardiovascular: Thoracic aorta demonstrates atherosclerotic calcifications without aneurysmal dilatation. Coronary calcifications are seen. Mild cardiac enlargement is noted. Pulmonary artery is well visualized bilaterally within normal branching pattern. No intraluminal filling defect is identified to suggest pulmonary embolism. Mediastinum/Nodes: Thoracic inlet is within normal limits. No sizable hilar or mediastinal adenopathy is noted. The esophagus as visualized is within normal limits with the exception of a sliding-type hiatal hernia. Lungs/Pleura: Bilateral pleural effusions are noted, left considerably greater than right. Right lung demonstrates basilar lower lobe atelectatic changes. More marked consolidation is noted in the left lower lobe and lingula. No discrete mass is noted on this exam. No sizable parenchymal nodule is seen. Upper Abdomen: Visualized upper abdomen is within normal limits. Musculoskeletal: Degenerative changes of the thoracic spine are noted. No acute rib abnormality is noted. Compression deformity with evidence of prior vertebral augmentation is seen at T12. Mild upper compression deformities are noted which appear chronic in nature. Review of the MIP images  confirms the above findings. IMPRESSION: Bilateral  pleural effusions left greater than right with associated consolidation. Hiatal hernia. No evidence of pulmonary emboli Aortic Atherosclerosis (ICD10-I70.0). Electronically Signed   By: Inez Catalina M.D.   On: 07/11/2020 19:42   CT EXTREMITY LOWER RIGHT WO CONTRAST  Result Date: 07/12/2020 CLINICAL DATA:  Soft tissue mass of thigh.  Spontaneous hemorrhage EXAM: CT OF THE LOWER RIGHT EXTREMITY WITHOUT CONTRAST TECHNIQUE: Multidetector CT imaging of the right lower extremity was performed according to the standard protocol. COMPARISON:  None. FINDINGS: Bones/Joint/Cartilage Negative for fracture, erosion, or bone lesion Ligaments Suboptimally assessed by CT. Muscles and Tendons Heterogeneously high-density mass in the medial compartment thigh musculature, elongated along muscle fibers and measuring 8 cm in length by approximately 3.3 cm in diameter. Soft tissues Marked generalized subcutaneous edema which is symmetric in the visible lower extremities and especially marked in the scrotum. No soft tissue gas or subcutaneous mass/hematoma. IMPRESSION: 1. High-density mass in the medial compartment right thigh that is likely hematoma, ~8 x 3 cm. The location is deep for visible bruising and the CT appearance is nonspecific, consider follow-up in few months to ensure resolution and exclude a mass. 2. Marked anasarca. Electronically Signed   By: Monte Fantasia M.D.   On: 07/12/2020 04:24   VAS [place] LOWER EXTREMITY VENOUS (DVT) ([facility] and [facility] 7a-7p)  Result Date: 07/11/2020  Lower Venous DVT Study Patient Name:  KENNIS WISSMANN  Date of Exam:   07/11/2020 Medical Rec #: [id number]       Accession #:    [id number] Date of Birth: [birth year]       Patient Gender: M Patient Age:   89Y Exam Location:  [facility] Procedure:      VAS [place] LOWER EXTREMITY VENOUS (DVT) Referring Phys: [id number] [place] --------------------------------------------------------------------------------  Indications: Swelling, Edema, and  Pain.  Limitations: Poor ultrasound/tissue interface. Comparison Study: no prior Performing Technologist: Archie Patten RVS  Examination Guidelines: A complete evaluation includes B-mode imaging, spectral Doppler, color Doppler, and power Doppler as needed of all accessible portions of each vessel. Bilateral testing is considered an integral part of a complete examination. Limited examinations for reoccurring indications may be performed as noted. The reflux portion of the exam is performed with the patient in reverse Trendelenburg.  +---------+---------------+---------+-----------+----------+-------------------+ RIGHT    CompressibilityPhasicitySpontaneityPropertiesThrombus Aging      +---------+---------------+---------+-----------+----------+-------------------+ CFV      Full           Yes      Yes                                      +---------+---------------+---------+-----------+----------+-------------------+ SFJ      Full                                                             +---------+---------------+---------+-----------+----------+-------------------+ FV Prox  Full                                                             +---------+---------------+---------+-----------+----------+-------------------+  FV Mid                  Yes      Yes                                      +---------+---------------+---------+-----------+----------+-------------------+ FV Distal               Yes      Yes                                      +---------+---------------+---------+-----------+----------+-------------------+ PFV      Full                                                             +---------+---------------+---------+-----------+----------+-------------------+ POP      Full           Yes      Yes                                      +---------+---------------+---------+-----------+----------+-------------------+ PTV      Full                                                              +---------+---------------+---------+-----------+----------+-------------------+ PERO                                                  Not well visualized +---------+---------------+---------+-----------+----------+-------------------+   +---------+---------------+---------+-----------+----------+-------------------+ LEFT     CompressibilityPhasicitySpontaneityPropertiesThrombus Aging      +---------+---------------+---------+-----------+----------+-------------------+ CFV      Full           Yes      Yes                                      +---------+---------------+---------+-----------+----------+-------------------+ SFJ      Full                                                             +---------+---------------+---------+-----------+----------+-------------------+ FV Prox  Full                                                             +---------+---------------+---------+-----------+----------+-------------------+ FV Mid   Full                                                             +---------+---------------+---------+-----------+----------+-------------------+  FV DistalFull                                                             +---------+---------------+---------+-----------+----------+-------------------+ PFV      Full                                                             +---------+---------------+---------+-----------+----------+-------------------+ POP      Full           Yes      Yes                                      +---------+---------------+---------+-----------+----------+-------------------+ PTV      Full                                                             +---------+---------------+---------+-----------+----------+-------------------+ PERO                                                  Not well visualized  +---------+---------------+---------+-----------+----------+-------------------+     Summary: RIGHT: - There is no evidence of deep vein thrombosis in the lower extremity. However, portions of this examination were limited- see technologist comments above.  - No cystic structure found in the popliteal fossa.  LEFT: - There is no evidence of deep vein thrombosis in the lower extremity. However, portions of this examination were limited- see technologist comments above.  - No cystic structure found in the popliteal fossa.  *See table(s) above for measurements and observations.    Preliminary      Assessment and Plan:   1. Lower extremity edema, scrotal edema, and pleural effusions suspected due to acute on chronic diastolic CHF - suspect testicular swelling is more of a sequelae of anasarca rather than intrinsic urologic process - I/O's not available yet for review - given degree of edema and CKD, may require higher dosing to 80mg  IV BID but follow response on 40mg  BID today - await 2D echocardiogram - consider GI f/u as well given prior question of cirrhosis, elevated bilirubin and decreased albumin level  2. Chronic atrial fibrillation - rate controlled on Toprol 50mg  daily - was on warfarin prior to admission, last INR check 04/2020, intermittently supratherapeutic - consideration should be given to changing to DOAC this admission - was previously cost prohibitive 5 years ago so will place care management order to assess cost - warfarin currently on hold due to thigh hematoma, await medical clearance on when this can be resumed  3. Moderate aortic insufficiency by echo 2020 - recheck echo pending  4. CKD stage IIIb, baseline Cr appears 1.5-1.9 range - stable, continue to follow in context of above -  holding ACEi given diuresis and last BP 116/51  5. Possible CAP - question of consolidation on CXR/CT, will defer abx to primary team  6. Recent fall - agree with PT/OT - ongoing outpatient  f/u will be needed to assess ongoing fall risk  Risk Assessment/Risk Scores:        New York Heart Association (NYHA) Functional Class NYHA Class III  CHA2DS2-VASc Score = 6  This indicates a 9.7% annual risk of stroke. The patient's score is based upon: CHF History: Yes HTN History: Yes Diabetes History: Yes Stroke History: No Vascular Disease History: Yes - aortic athero on CT Age Score: 2 Gender Score: 0        For questions or updates, please contact [place] Please consult [email or web address] for contact info under    Signed, Charlie Pitter, PA-C  07/12/2020 8:39 AM

## 2020-07-12 NOTE — Assessment & Plan Note (Addendum)
-   last echo 11/2018: EF 60-65%, dilated LA, RA concerning for DD - now with significant LE edema, scrotal edema, SOB - follow up repeat echo: EF 60-65%, severely dilated LA; mildly dilated RA -Cardiology following, appreciate assistance - Continue Lasix per cardiology recs - still appears a little dyspneic in bed; his RR's have been elevated even still today, as high as 33 but also noted 27-28 over past 24 hours

## 2020-07-12 NOTE — Progress Notes (Signed)
Pedal pulse on right foot difficult to palpate due to edema. Doppler used to verify pulse. Pulse strong bilaterally. Skin marker used to verify location.

## 2020-07-12 NOTE — Assessment & Plan Note (Signed)
-   Duplex negative for DVT - Legs are edematous bilaterally -Coumadin on hold - CT shows approximately 8 x 3 cm hematoma - continue neurovas checks qshift

## 2020-07-12 NOTE — Assessment & Plan Note (Addendum)
-  patient has history of CKD3b. Baseline creat ~ 1.4 - 1.5, eGFR 35 - 41 - patient presents with increase in creat >0.3 mg/dL above baseline presumed to have occurred within past 7 days PTA - creat 1.74 on admission - etiology presumed from CRS - continue lasix and monitor renal function on diuresis - strict I&O

## 2020-07-12 NOTE — Progress Notes (Signed)
Progress Note    MONTOYA BRANDEL   MQK:[id number]  DOB: May 12, 1931  DOA: 07/11/2020     1  PCP: Midge Minium, MD  CC: SOB, swelling  Hospital Course: Mr. Auguste is an 87 yo male with PMH chronic AF (on Coumadin), dCHF, CKDIIIb, HTN, DMII, chronic venous insufficiency (followed by wound clinic) who presented with SOB.  He has had progressively worsening swelling of his lower extremities and also started complaining of increased swelling in his scrotum. On admission he had described symptoms consistent with orthopnea. He underwent CTA chest which was negative for PE and showed bilateral pleural effusions, left greater than right.  There was also question of consolidation noted in the left lower lobe and lingula.  No discrete mass was appreciated.  Due to his right thigh bruising and swelling he underwent CT of the right lower extremity.  This revealed a hematoma measuring approximately 8 x 3 cm. Bilateral lower extremity duplex also performed on admission which was negative for DVT but had some limitations.  Notable labs on admission included: Troponin 11>>11. BNP 420. BUN 23, Creat 1.74. INR 3.8. Hgb 11.9 g/dL (baseline). Negative covid/flu swabs. Negative PCT.  He was initially started on antibiotics to cover for pneumonia on admission.  He was also started on IV Lasix for diuresis given concern for volume overload.  Interval History:  Seen in his room this morning.  He still endorses the scrotal edema and significant lower extremity swelling.  Slightly a poor historian but denied hitting his right thigh or falling however has a bandage on his left forearm that he says is from a fall and now has a skin tear there.  ROS: Constitutional: negative for chills and fevers, Respiratory: negative for cough, Cardiovascular: negative for chest pain, and Gastrointestinal: negative for abdominal pain  Assessment & Plan: Acute on chronic diastolic CHF (congestive heart failure) ([facility]) - last  echo 11/2018: EF 60-65%, dilated LA, RA concerning for DD - now with significant LE edema, scrotal edema, SOB - follow up repeat echo - have asked cardiology to also assist given significant amount of edema and probable change to regimen needed - continue IV lasix; defer adjustments to cardiology  Acute renal failure superimposed on stage 3b chronic kidney disease ([place]) - patient has history of CKD3b. Baseline creat ~ 1.4 - 1.5, eGFR 35 - 41 - patient presents with increase in creat >0.3 mg/dL above baseline presumed to have occurred within past 7 days PTA - creat 1.74 on admission - etiology presumed from CRS - continue lasix and monitor renal function on diuresis    Pleural effusion due to CHF (congestive heart failure) (HCC) - see CHF - continue diuresis - low suspicion for CAP at this time; left consolidation noted on CTA; follow up imaging may be needed after sufficient diuresis - PCT negative, afebrile, no leukocytosis, no cough/sputum - d/c abx and monitor clinically   Hepatic congestion - suspect elevated Tbili consistent with congestive hepatopathy - increased mortality associated  Atrial fibrillation, chronic (HCC) - coumadin on hold in setting of R thigh hematoma and supratherapeutic INR on admission - cardiology may recommend DOAC as well; previously was unaffordable  Thigh hematoma, right, initial encounter - Duplex negative for DVT - Legs are edematous bilaterally -Coumadin on hold - CT shows approximately 8 x 3 cm hematoma - continue neurovas checks qshift  Obesity (BMI 30-39.9) Body mass index is 32.59 kg/m.  Chronic venous insufficiency - follows outpatient with wound center   Essential hypertension -  Monitor blood pressure while on diuresis - Continue Toprol  Normocytic anemia - baseline Hgb 10-11 g/dL - currently at baseline   Diabetes mellitus without complication (HCC) - continue SSI and CBG monitoring  Hyperlipidemia - Continue  statin   Old records reviewed in assessment of this patient  Antimicrobials:   DVT prophylaxis: Place and maintain sequential compression device Start: 07/12/20 0139   Code Status:   Code Status: DNR Family Communication:   Disposition Plan: Status is: Inpatient  Remains inpatient appropriate because:Ongoing diagnostic testing needed not appropriate for outpatient work up, IV treatments appropriate due to intensity of illness or inability to take PO, and Inpatient level of care appropriate due to severity of illness  Dispo: The patient is from: Home              Anticipated d/c is to: Home              Patient currently is not medically stable to d/c.   Difficult to place patient No  Risk of unplanned readmission score: Unplanned Admission- Pilot do not use: 17.1   Objective: Blood pressure (!) 116/51, pulse 75, temperature 98 F (36.7 C), temperature source Oral, resp. rate 20, height $RemoveBe'5\' 11"'xARtPLpec$  (1.803 m), weight 106 kg, SpO2 95 %.  Examination: General appearance: alert, cooperative, and no distress Head: Normocephalic, without obvious abnormality, atraumatic Eyes:  EOMI Lungs:  Bilateral crackles, decreased left basilar lung sounds Heart: regular rate and rhythm, S1, S2 normal, and 3/6 HSM best heard in LLSB Abdomen:  obese, soft, BS present GU: edematous scrotum appox grapefruit sized  Extremities:  3+ LE edema bilaterally up to thighs; right thigh noted with large bruising ; unable to palpate DP pulses Skin:  frail skin in UE with skin tears Neurologic: no focal deficits  Consultants:  Cardiology  Procedures:    Data Reviewed: I have personally reviewed following labs and imaging studies Results for orders placed or performed during the hospital encounter of 07/11/20 (from the past 24 hour(s))  Troponin I (High Sensitivity)     Status: None   Collection Time: 07/11/20  3:56 PM  Result Value Ref Range   Troponin I (High Sensitivity) 11 <18 ng/L  Brain natriuretic  peptide     Status: Abnormal   Collection Time: 07/11/20  3:56 PM  Result Value Ref Range   B Natriuretic Peptide 420.8 (H) 0.0 - 100.0 pg/mL  Comprehensive metabolic panel     Status: Abnormal   Collection Time: 07/11/20  3:56 PM  Result Value Ref Range   Sodium 137 135 - 145 mmol/L   Potassium 4.4 3.5 - 5.1 mmol/L   Chloride 103 98 - 111 mmol/L   CO2 29 22 - 32 mmol/L   Glucose, Bld 97 70 - 99 mg/dL   BUN 23 8 - 23 mg/dL   Creatinine, Ser 1.74 (H) 0.61 - 1.24 mg/dL   Calcium 8.6 (L) 8.9 - 10.3 mg/dL   Total Protein 6.5 6.5 - 8.1 g/dL   Albumin 2.9 (L) 3.5 - 5.0 g/dL   AST 22 15 - 41 U/L   ALT 11 0 - 44 U/L   Alkaline Phosphatase 93 38 - 126 U/L   Total Bilirubin 2.4 (H) 0.3 - 1.2 mg/dL   GFR, Estimated 37 (L) >60 mL/min   Anion gap 5 5 - 15  Protime-INR     Status: Abnormal   Collection Time: 07/11/20  3:56 PM  Result Value Ref Range   Prothrombin Time 37.6 (H) 11.4 -  15.2 seconds   INR 3.8 (H) 0.8 - 1.2  Troponin I (High Sensitivity)     Status: None   Collection Time: 07/11/20  5:49 PM  Result Value Ref Range   Troponin I (High Sensitivity) 11 <18 ng/L  CBC with Differential     Status: Abnormal   Collection Time: 07/11/20  5:49 PM  Result Value Ref Range   WBC 6.0 4.0 - 10.5 K/uL   RBC 3.86 (L) 4.22 - 5.81 MIL/uL   Hemoglobin 11.9 (L) 13.0 - 17.0 g/dL   HCT 37.1 (L) 39.0 - 52.0 %   MCV 96.1 80.0 - 100.0 fL   MCH 30.8 26.0 - 34.0 pg   MCHC 32.1 30.0 - 36.0 g/dL   RDW 17.4 (H) 11.5 - 15.5 %   Platelets 305 150 - 400 K/uL   nRBC 0.0 0.0 - 0.2 %   Neutrophils Relative % 66 %   Neutro Abs 3.9 1.7 - 7.7 K/uL   Lymphocytes Relative 18 %   Lymphs Abs 1.1 0.7 - 4.0 K/uL   Monocytes Relative 12 %   Monocytes Absolute 0.7 0.1 - 1.0 K/uL   Eosinophils Relative 3 %   Eosinophils Absolute 0.2 0.0 - 0.5 K/uL   Basophils Relative 1 %   Basophils Absolute 0.1 0.0 - 0.1 K/uL   Immature Granulocytes 0 %   Abs Immature Granulocytes 0.02 0.00 - 0.07 K/uL  Hemoglobin A1c      Status: None   Collection Time: 07/11/20  5:49 PM  Result Value Ref Range   Hgb A1c MFr Bld 5.6 4.8 - 5.6 %   Mean Plasma Glucose 114.02 mg/dL  Resp Panel by RT-PCR (Flu A&B, Covid) Nasopharyngeal Swab     Status: None   Collection Time: 07/11/20  6:10 PM   Specimen: Nasopharyngeal Swab; Nasopharyngeal(NP) swabs in vial transport medium  Result Value Ref Range   SARS Coronavirus 2 by RT PCR NEGATIVE NEGATIVE   Influenza A by PCR NEGATIVE NEGATIVE   Influenza B by PCR NEGATIVE NEGATIVE  Glucose, capillary     Status: Abnormal   Collection Time: 07/11/20 11:47 PM  Result Value Ref Range   Glucose-Capillary 145 (H) 70 - 99 mg/dL   Comment 1 QC Due   CBG monitoring, ED     Status: None   Collection Time: 07/12/20  4:22 AM  Result Value Ref Range   Glucose-Capillary 96 70 - 99 mg/dL  Prealbumin     Status: Abnormal   Collection Time: 07/12/20  4:27 AM  Result Value Ref Range   Prealbumin 5.3 (L) 18 - 38 mg/dL  Magnesium     Status: None   Collection Time: 07/12/20  4:27 AM  Result Value Ref Range   Magnesium 2.2 1.7 - 2.4 mg/dL  Phosphorus     Status: None   Collection Time: 07/12/20  4:27 AM  Result Value Ref Range   Phosphorus 2.7 2.5 - 4.6 mg/dL  CBC WITH DIFFERENTIAL     Status: Abnormal   Collection Time: 07/12/20  4:27 AM  Result Value Ref Range   WBC 5.8 4.0 - 10.5 K/uL   RBC 3.53 (L) 4.22 - 5.81 MIL/uL   Hemoglobin 11.0 (L) 13.0 - 17.0 g/dL   HCT 33.7 (L) 39.0 - 52.0 %   MCV 95.5 80.0 - 100.0 fL   MCH 31.2 26.0 - 34.0 pg   MCHC 32.6 30.0 - 36.0 g/dL   RDW 17.7 (H) 11.5 - 15.5 %   Platelets 300  150 - 400 K/uL   nRBC 0.0 0.0 - 0.2 %   Neutrophils Relative % 67 %   Neutro Abs 4.0 1.7 - 7.7 K/uL   Lymphocytes Relative 16 %   Lymphs Abs 0.9 0.7 - 4.0 K/uL   Monocytes Relative 13 %   Monocytes Absolute 0.7 0.1 - 1.0 K/uL   Eosinophils Relative 3 %   Eosinophils Absolute 0.2 0.0 - 0.5 K/uL   Basophils Relative 1 %   Basophils Absolute 0.0 0.0 - 0.1 K/uL   Immature  Granulocytes 0 %   Abs Immature Granulocytes 0.02 0.00 - 0.07 K/uL  TSH     Status: None   Collection Time: 07/12/20  4:27 AM  Result Value Ref Range   TSH 2.179 0.350 - 4.500 uIU/mL  Comprehensive metabolic panel     Status: Abnormal   Collection Time: 07/12/20  4:27 AM  Result Value Ref Range   Sodium 140 135 - 145 mmol/L   Potassium 3.8 3.5 - 5.1 mmol/L   Chloride 104 98 - 111 mmol/L   CO2 28 22 - 32 mmol/L   Glucose, Bld 106 (H) 70 - 99 mg/dL   BUN 26 (H) 8 - 23 mg/dL   Creatinine, Ser 1.54 (H) 0.61 - 1.24 mg/dL   Calcium 8.7 (L) 8.9 - 10.3 mg/dL   Total Protein 6.5 6.5 - 8.1 g/dL   Albumin 3.0 (L) 3.5 - 5.0 g/dL   AST 23 15 - 41 U/L   ALT 11 0 - 44 U/L   Alkaline Phosphatase 93 38 - 126 U/L   Total Bilirubin 2.5 (H) 0.3 - 1.2 mg/dL   GFR, Estimated 43 (L) >60 mL/min   Anion gap 8 5 - 15  Vitamin B12     Status: Abnormal   Collection Time: 07/12/20  4:27 AM  Result Value Ref Range   Vitamin B-12 1,285 (H) 180 - 914 pg/mL  Folate     Status: None   Collection Time: 07/12/20  4:27 AM  Result Value Ref Range   Folate 6.3 >5.9 ng/mL  Iron and TIBC     Status: None   Collection Time: 07/12/20  4:27 AM  Result Value Ref Range   Iron 95 45 - 182 ug/dL   TIBC 268 250 - 450 ug/dL   Saturation Ratios 35 17.9 - 39.5 %   UIBC 173 ug/dL  Ferritin     Status: None   Collection Time: 07/12/20  4:27 AM  Result Value Ref Range   Ferritin 72 24 - 336 ng/mL  Reticulocytes     Status: Abnormal   Collection Time: 07/12/20  4:27 AM  Result Value Ref Range   Retic Ct Pct 1.9 0.4 - 3.1 %   RBC. 3.59 (L) 4.22 - 5.81 MIL/uL   Retic Count, Absolute 66.8 19.0 - 186.0 K/uL   Immature Retic Fract 13.9 2.3 - 15.9 %  Procalcitonin - Baseline     Status: None   Collection Time: 07/12/20  4:27 AM  Result Value Ref Range   Procalcitonin <0.10 ng/mL  Protime-INR     Status: Abnormal   Collection Time: 07/12/20  4:27 AM  Result Value Ref Range   Prothrombin Time 36.1 (H) 11.4 - 15.2 seconds    INR 3.6 (H) 0.8 - 1.2  Glucose, capillary     Status: None   Collection Time: 07/12/20  7:15 AM  Result Value Ref Range   Glucose-Capillary 94 70 - 99 mg/dL  Glucose, capillary  Status: Abnormal   Collection Time: 07/12/20 11:18 AM  Result Value Ref Range   Glucose-Capillary 138 (H) 70 - 99 mg/dL    Recent Results (from the past 240 hour(s))  Resp Panel by RT-PCR (Flu A&B, Covid) Nasopharyngeal Swab     Status: None   Collection Time: 07/11/20  6:10 PM   Specimen: Nasopharyngeal Swab; Nasopharyngeal(NP) swabs in vial transport medium  Result Value Ref Range Status   SARS Coronavirus 2 by RT PCR NEGATIVE NEGATIVE Final    Comment: (NOTE) SARS-CoV-2 target nucleic acids are NOT DETECTED.  The SARS-CoV-2 RNA is generally detectable in upper respiratory specimens during the acute phase of infection. The lowest concentration of SARS-CoV-2 viral copies this assay can detect is 138 copies/mL. A negative result does not preclude SARS-Cov-2 infection and should not be used as the sole basis for treatment or other patient management decisions. A negative result may occur with  improper specimen collection/handling, submission of specimen other than nasopharyngeal swab, presence of viral mutation(s) within the areas targeted by this assay, and inadequate number of viral copies(<138 copies/mL). A negative result must be combined with clinical observations, patient history, and epidemiological information. The expected result is Negative.  Fact Sheet for Patients:  EntrepreneurPulse.com.au  Fact Sheet for Healthcare Providers:  IncredibleEmployment.be  This test is no t yet approved or cleared by the [place] FDA and  has been authorized for detection and/or diagnosis of SARS-CoV-2 by FDA under an Emergency Use Authorization (EUA). This EUA will remain  in effect (meaning this test can be used) for the duration of the COVID-19 declaration  under Section 564(b)(1) of the Act, 21 U.S.C.section 360bbb-3(b)(1), unless the authorization is terminated  or revoked sooner.       Influenza A by PCR NEGATIVE NEGATIVE Final   Influenza B by PCR NEGATIVE NEGATIVE Final    Comment: (NOTE) The Xpert Xpress SARS-CoV-2/FLU/RSV plus assay is intended as an aid in the diagnosis of influenza from Nasopharyngeal swab specimens and should not be used as a sole basis for treatment. Nasal washings and aspirates are unacceptable for Xpert Xpress SARS-CoV-2/FLU/RSV testing.  Fact Sheet for Patients: EntrepreneurPulse.com.au  Fact Sheet for Healthcare Providers: IncredibleEmployment.be  This test is not yet approved or cleared by the [place] FDA and has been authorized for detection and/or diagnosis of SARS-CoV-2 by FDA under an Emergency Use Authorization (EUA). This EUA will remain in effect (meaning this test can be used) for the duration of the COVID-19 declaration under Section 564(b)(1) of the Act, 21 U.S.C. section 360bbb-3(b)(1), unless the authorization is terminated or revoked.  Performed at [facility], [place] [address], [place], [place] [address]      Radiology Studies: DG Chest 2 View  Result Date: 07/11/2020 CLINICAL DATA:  Four weeks of lower extremity swelling and edema. Shortness of breath on exertion. EXAM: CHEST - 2 VIEW COMPARISON:  CT abdomen January 21, 2016. FINDINGS: The heart size and mediastinal contours are partially obscured. Aortic atherosclerosis. Moderate hiatal hernia. Moderate left pleural effusion with adjacent parenchymal consolidation versus atelectasis. Right lung is predominantly clear. Prior lower thoracic vertebral augmentation. IMPRESSION: Moderate left pleural effusion with adjacent parenchymal consolidation versus atelectasis. Electronically Signed   By: Dahlia Bailiff MD   On: 07/11/2020 16:16   CT Angio Chest PE W and/or Wo  Contrast  Result Date: 07/11/2020 CLINICAL DATA:  Increasing shortness of breath EXAM: CT ANGIOGRAPHY CHEST WITH CONTRAST TECHNIQUE: Multidetector CT imaging of the chest was performed using the  standard protocol during bolus administration of intravenous contrast. Multiplanar CT image reconstructions and MIPs were obtained to evaluate the vascular anatomy. CONTRAST:  93mL OMNIPAQUE IOHEXOL 350 MG/ML SOLN COMPARISON:  Chest x-ray from earlier in the same day. FINDINGS: Cardiovascular: Thoracic aorta demonstrates atherosclerotic calcifications without aneurysmal dilatation. Coronary calcifications are seen. Mild cardiac enlargement is noted. Pulmonary artery is well visualized bilaterally within normal branching pattern. No intraluminal filling defect is identified to suggest pulmonary embolism. Mediastinum/Nodes: Thoracic inlet is within normal limits. No sizable hilar or mediastinal adenopathy is noted. The esophagus as visualized is within normal limits with the exception of a sliding-type hiatal hernia. Lungs/Pleura: Bilateral pleural effusions are noted, left considerably greater than right. Right lung demonstrates basilar lower lobe atelectatic changes. More marked consolidation is noted in the left lower lobe and lingula. No discrete mass is noted on this exam. No sizable parenchymal nodule is seen. Upper Abdomen: Visualized upper abdomen is within normal limits. Musculoskeletal: Degenerative changes of the thoracic spine are noted. No acute rib abnormality is noted. Compression deformity with evidence of prior vertebral augmentation is seen at T12. Mild upper compression deformities are noted which appear chronic in nature. Review of the MIP images confirms the above findings. IMPRESSION: Bilateral pleural effusions left greater than right with associated consolidation. Hiatal hernia. No evidence of pulmonary emboli Aortic Atherosclerosis (ICD10-I70.0). Electronically Signed   By: Inez Catalina M.D.   On:  07/11/2020 19:42   CT EXTREMITY LOWER RIGHT WO CONTRAST  Result Date: 07/12/2020 CLINICAL DATA:  Soft tissue mass of thigh.  Spontaneous hemorrhage EXAM: CT OF THE LOWER RIGHT EXTREMITY WITHOUT CONTRAST TECHNIQUE: Multidetector CT imaging of the right lower extremity was performed according to the standard protocol. COMPARISON:  None. FINDINGS: Bones/Joint/Cartilage Negative for fracture, erosion, or bone lesion Ligaments Suboptimally assessed by CT. Muscles and Tendons Heterogeneously high-density mass in the medial compartment thigh musculature, elongated along muscle fibers and measuring 8 cm in length by approximately 3.3 cm in diameter. Soft tissues Marked generalized subcutaneous edema which is symmetric in the visible lower extremities and especially marked in the scrotum. No soft tissue gas or subcutaneous mass/hematoma. IMPRESSION: 1. High-density mass in the medial compartment right thigh that is likely hematoma, ~8 x 3 cm. The location is deep for visible bruising and the CT appearance is nonspecific, consider follow-up in few months to ensure resolution and exclude a mass. 2. Marked anasarca. Electronically Signed   By: Monte Fantasia M.D.   On: 07/12/2020 04:24   VAS [place] LOWER EXTREMITY VENOUS (DVT) (MC and WL 7a-7p)  Result Date: 07/12/2020  Lower Venous DVT Study Patient Name:  CLANCEY WELTON  Date of Exam:   07/11/2020 Medical Rec #: [id number]       Accession #:    [id number] Date of Birth: 03-09-1931       Patient Gender: M Patient Age:   89Y Exam Location:  [facility] Procedure:      VAS [place] LOWER EXTREMITY VENOUS (DVT) Referring Phys: [id number] [place] --------------------------------------------------------------------------------  Indications: Swelling, Edema, and Pain.  Limitations: Poor ultrasound/tissue interface. Comparison Study: no prior Performing Technologist: Archie Patten RVS  Examination Guidelines: A complete evaluation includes B-mode imaging, spectral  Doppler, color Doppler, and power Doppler as needed of all accessible portions of each vessel. Bilateral testing is considered an integral part of a complete examination. Limited examinations for reoccurring indications may be performed as noted. The reflux portion of the exam is performed with the patient in reverse Trendelenburg.  +---------+---------------+---------+-----------+----------+-------------------+  RIGHT    CompressibilityPhasicitySpontaneityPropertiesThrombus Aging      +---------+---------------+---------+-----------+----------+-------------------+ CFV      Full           Yes      Yes                                      +---------+---------------+---------+-----------+----------+-------------------+ SFJ      Full                                                             +---------+---------------+---------+-----------+----------+-------------------+ FV Prox  Full                                                             +---------+---------------+---------+-----------+----------+-------------------+ FV Mid                  Yes      Yes                                      +---------+---------------+---------+-----------+----------+-------------------+ FV Distal               Yes      Yes                                      +---------+---------------+---------+-----------+----------+-------------------+ PFV      Full                                                             +---------+---------------+---------+-----------+----------+-------------------+ POP      Full           Yes      Yes                                      +---------+---------------+---------+-----------+----------+-------------------+ PTV      Full                                                             +---------+---------------+---------+-----------+----------+-------------------+ PERO                                                  Not well  visualized +---------+---------------+---------+-----------+----------+-------------------+   +---------+---------------+---------+-----------+----------+-------------------+ LEFT     CompressibilityPhasicitySpontaneityPropertiesThrombus Aging      +---------+---------------+---------+-----------+----------+-------------------+  CFV      Full           Yes      Yes                                      +---------+---------------+---------+-----------+----------+-------------------+ SFJ      Full                                                             +---------+---------------+---------+-----------+----------+-------------------+ FV Prox  Full                                                             +---------+---------------+---------+-----------+----------+-------------------+ FV Mid   Full                                                             +---------+---------------+---------+-----------+----------+-------------------+ FV DistalFull                                                             +---------+---------------+---------+-----------+----------+-------------------+ PFV      Full                                                             +---------+---------------+---------+-----------+----------+-------------------+ POP      Full           Yes      Yes                                      +---------+---------------+---------+-----------+----------+-------------------+ PTV      Full                                                             +---------+---------------+---------+-----------+----------+-------------------+ PERO                                                  Not well visualized +---------+---------------+---------+-----------+----------+-------------------+     Summary: RIGHT: - There is no evidence of deep vein thrombosis in the lower extremity. However, portions of this examination  were limited- see  technologist comments above.  - No cystic structure found in the popliteal fossa.  LEFT: - There is no evidence of deep vein thrombosis in the lower extremity. However, portions of this examination were limited- see technologist comments above.  - No cystic structure found in the popliteal fossa.  *See table(s) above for measurements and observations. Electronically signed by Monica Martinez MD on 07/12/2020 at 10:25:31 AM.    Final    CT EXTREMITY LOWER RIGHT WO CONTRAST  Final Result    CT Angio Chest PE W and/or Wo Contrast  Final Result    VAS [place] LOWER EXTREMITY VENOUS (DVT) (MC and [facility] 7a-7p)  Final Result    DG Chest 2 View  Final Result      Scheduled Meds:  allopurinol  300 mg Oral Daily   docusate sodium  100 mg Oral BID   folic acid  1 mg Oral Daily   furosemide  40 mg Intravenous Q12H   insulin aspart  0-9 Units Subcutaneous Q4H   metoprolol succinate  50 mg Oral Daily   simvastatin  20 mg Oral Daily   sodium chloride flush  3 mL Intravenous Q12H   PRN Meds: sodium chloride, acetaminophen **OR** acetaminophen, HYDROcodone-acetaminophen, sodium chloride flush Continuous Infusions:  sodium chloride       LOS: 1 day  Time spent: Greater than 50% of the 35 minute visit was spent in counseling/coordination of care for the patient as laid out in the A&P.   Dwyane Dee, MD [facility] 07/12/2020, 12:31 PM

## 2020-07-12 NOTE — Assessment & Plan Note (Signed)
-   Monitor blood pressure while on diuresis - Continue Toprol

## 2020-07-12 NOTE — Assessment & Plan Note (Signed)
-   see CHF - continue diuresis - low suspicion for CAP at this time; left consolidation noted on CTA; follow up imaging may be needed after sufficient diuresis - PCT negative, afebrile, no leukocytosis, no cough/sputum - d/c abx and monitor clinically

## 2020-07-12 NOTE — Evaluation (Addendum)
Occupational Therapy Evaluation Patient Details Name: [name] MRN: [id number] DOB: 1931/10/01 Today's Date: 07/12/2020    History of Present Illness Patient is a 87 year old male who  presented to the hospital on 7/10 with increased edema in BLE. patient was found to have osteopnea. Due to his right thigh bruising and swelling he underwent CT of the right lower extremity. the CT  revealed a hematoma measuring approximately 8 x 3 cm in RLE. patient's past medical history is significant for  diastolic CHF, CKDIIIb, HTN, DM type II, chronic venous insufficiency.   Clinical Impression    Patient is a 87 year old male who was previously independent in all ADL tasks while caring for his wife. Currently patient is min guard with education for safety with using cane. Patient made attempts to complete ADLs with no cane with min guard for standing to use urinal and toileting tasks for BM. Patient was noted to have decreased activity tolerance, increased edema and pain in BLE impacting patients ability to get back to PLOF. Patient could benefit from acute skilled OT services to increase safety and independence in ADL tasks to be able to transition back home to care for wife.     Follow Up Recommendations  No OT follow up    Equipment Recommendations  Tub/shower seat    Recommendations for Other Services       Precautions / Restrictions Precautions Precautions: Fall Precaution Comments: PICC line RUE, BLE edema Restrictions Weight Bearing Restrictions: No      Mobility Bed Mobility                    Transfers                      Balance Overall balance assessment: Needs assistance   Sitting balance-Leahy Scale: Good     Standing balance support: Single extremity supported Standing balance-Leahy Scale: Fair                             ADL either performed or assessed with clinical judgement   ADL Overall ADL's : Needs  assistance/impaired Eating/Feeding: Modified independent   Grooming: Oral care;Wash/dry face;Supervision/safety;Standing   Upper Body Bathing: Set up;Sitting   Lower Body Bathing: Minimal assistance;Sit to/from stand   Upper Body Dressing : Set up;Sitting   Lower Body Dressing: Sit to/from stand;Moderate assistance Lower Body Dressing Details (indicate cue type and reason): patient is unable to complete donning/doffing sockss at this time with increased pain. patient completed boxer management with sup standing in bathroom. Toilet Transfer: Min guard;Ambulation   Toileting- Clothing Manipulation and Hygiene: Min guard;Sit to/from stand       Functional mobility during ADLs: Min guard;Cane       Vision Baseline Vision/History: Wears glasses Wears Glasses: At all times       Perception     Praxis      Pertinent Vitals/Pain Pain Assessment: Faces Faces Pain Scale: Hurts little more Pain Location: bilateral legs with increased grimancing with hip flexion. Pain Descriptors / Indicators: Grimacing;Guarding Pain Intervention(s): Limited activity within patient's tolerance;Monitored during session     Hand Dominance Right   Extremity/Trunk Assessment Upper Extremity Assessment Upper Extremity Assessment: Overall WFL for tasks assessed   Lower Extremity Assessment Lower Extremity Assessment: Defer to PT evaluation   Cervical / Trunk Assessment Cervical / Trunk Assessment: Kyphotic   Communication Communication Communication: No difficulties  Cognition Arousal/Alertness: Awake/alert Behavior During Therapy: WFL for tasks assessed/performed Overall Cognitive Status: Within Functional Limits for tasks assessed                                     General Comments       Exercises     Shoulder Instructions      Home Living Family/patient expects to be discharged to:: Private residence Living Arrangements: Spouse/significant other (pt takes care  of wife. Wife uses a walker.) Available Help at Discharge: Family;Available PRN/intermittently Type of Home: House Home Access: Stairs to enter Entrance Stairs-Number of Steps: 2   Home Layout: Able to live on main level with bedroom/bathroom     Bathroom Shower/Tub: Tub/shower unit;Walk-in shower   Bathroom Toilet: Standard Bathroom Accessibility: Yes   Home Equipment: Cane - single point          Prior Functioning/Environment Level of Independence: Independent with assistive device(s)        Comments: uses cane PRN. Pt takes care of wife        OT Problem List: Decreased strength;Decreased activity tolerance;Decreased knowledge of use of DME or AE      OT Treatment/Interventions: Self-care/ADL training;Therapeutic activities;Balance training;Therapeutic exercise;Patient/family education    OT Goals(Current goals can be found in the care plan section) Acute Rehab OT Goals Patient Stated Goal: to get sewlling out of legs. OT Goal Formulation: With patient Time For Goal Achievement: 07/26/20 Potential to Achieve Goals: Good  OT Frequency: Min 2X/week   Barriers to D/C:    patient needs to be able to care for himself and wife at home.       Co-evaluation              AM-PAC OT "6 Clicks" Daily Activity     Outcome Measure Help from another person eating meals?: None Help from another person taking care of personal grooming?: None Help from another person toileting, which includes using toliet, bedpan, or urinal?: A Little Help from another person bathing (including washing, rinsing, drying)?: A Little Help from another person to put on and taking off regular upper body clothing?: A Little Help from another person to put on and taking off regular lower body clothing?: A Lot 6 Click Score: 19   End of Session Equipment Utilized During Treatment: Gait belt;Other (comment) (personal cane) Nurse Communication: Other (comment) (nurse cleared patient for  session.)  Activity Tolerance:   Patient left: in bed;with call bell/phone within reach;with bed alarm set  OT Visit Diagnosis: Unsteadiness on feet (R26.81);Muscle weakness (generalized) (M62.81)                Time: 5631-4970 OT Time Calculation (min): 33 min Charges:  OT General Charges $OT Visit: 1 Visit OT Evaluation $OT Eval Low Complexity: 1 Low OT Treatments $Self Care/Home Management : 8-22 mins  Jackelyn Poling OTR/L, MS Acute Rehabilitation Department Office# [phone] Pager# [phone]   [name] 07/12/2020, 3:58 PM

## 2020-07-12 NOTE — Evaluation (Signed)
Physical Therapy Evaluation Patient Details Name: [name] MRN: [id number] DOB: 1931-02-19 Today's Date: 07/12/2020   History of Present Illness  87 yo male admitted with CHF, bil LE edema, pleural effusion, fall at home recently. Hx of CHF, DM, A fib, CKD, obesity, venous insufficiency, falls.  Clinical Impression  On eval, pt required Min guard-Min A for mobility. He walked ~65 feet with a cane. LOB x 1 during session. Pt is also generally unsteady. Has fallen at home per chart. Daughter reports pt's wife falls a lot. Daughter also reports she is beginning to consider options for both pt and wife in the near future-encouraged her to speak with a  CM to see if they can offer any information. Recommend is for HHPT at this time. Will follow and progress activity as tolerated.     Follow Up Recommendations Home health PT;Supervision/Assistance - 24 hour (if pt is agreeable to f/u)    Equipment Recommendations  Rolling walker with 5" wheels (if pt doesn't have one, is open to using one)    Recommendations for Other Services       Precautions / Restrictions Precautions Precautions: Fall Precaution Comments: PICC line RUE, BLE edema Restrictions Weight Bearing Restrictions: No      Mobility  Bed Mobility Overal bed mobility: Modified Independent Bed Mobility: Supine to Sit;Sit to Supine     Supine to sit: Modified independent (Device/Increase time) Sit to supine: HOB elevated;Modified independent (Device/Increase time)        Transfers Overall transfer level: Needs assistance Equipment used: Straight cane;None Transfers: Sit to/from Stand Sit to Stand: Min assist         General transfer comment: Assist to steady. Cues for safety.  Ambulation/Gait Ambulation/Gait assistance: Min assist Gait Distance (Feet): 65 Feet Assistive device: Straight cane Gait Pattern/deviations: Step-through pattern;Wide base of support     General Gait Details: Unsteady with cane.  LOB x 1 with dual tasking-"you distracted me.". When walking without device. pt tends to furniture surf. Walked to/from bathroom without cane-unsteady.   Stairs            Wheelchair Mobility    Modified Rankin (Stroke Patients Only)       Balance Overall balance assessment: Needs assistance   Sitting balance-Leahy Scale: Good     Standing balance support: Bilateral upper extremity supported Standing balance-Leahy Scale: Fair                               Pertinent Vitals/Pain Pain Assessment: Faces Faces Pain Scale: Hurts a little bit Pain Location: bil LEs Pain Descriptors / Indicators: Discomfort Pain Intervention(s): Monitored during session;Limited activity within patient's tolerance;Repositioned    Home Living Family/patient expects to be discharged to:: Private residence Living Arrangements: Spouse/significant other (pt takes care of wife. Wife uses a walker.) Available Help at Discharge: Family;Available PRN/intermittently Type of Home: House Home Access: Stairs to enter   [employer] of Steps: 2 Home Layout: Able to live on main level with bedroom/bathroom Home Equipment: Cane - single point      Prior Function Level of Independence: Independent with assistive device(s)         Comments: uses cane PRN. Pt takes care of wife     Hand Dominance   Dominant Hand: Right    Extremity/Trunk Assessment   Upper Extremity Assessment Upper Extremity Assessment: Defer to OT evaluation    Lower Extremity Assessment Lower Extremity Assessment: Generalized weakness (bil  LE edema)    Cervical / Trunk Assessment Cervical / Trunk Assessment: Normal  Communication   Communication: No difficulties  Cognition Arousal/Alertness: Awake/alert Behavior During Therapy: WFL for tasks assessed/performed Overall Cognitive Status: Within Functional Limits for tasks assessed                                        General  Comments      Exercises     Assessment/Plan    PT Assessment Patient needs continued PT services  PT Problem List Decreased strength;Decreased mobility;Decreased activity tolerance;Decreased balance;Decreased knowledge of use of DME       PT Treatment Interventions DME instruction;Gait training;Therapeutic exercise;Balance training;Functional mobility training;Therapeutic activities;Patient/family education    PT Goals (Current goals can be found in the Care Plan section)  Acute Rehab PT Goals Patient Stated Goal: get better PT Goal Formulation: With patient/family Time For Goal Achievement: 07/26/20 Potential to Achieve Goals: Good    Frequency Min 3X/week   Barriers to discharge        Co-evaluation               AM-PAC PT "6 Clicks" Mobility  Outcome Measure Help needed turning from your back to your side while in a flat bed without using bedrails?: A Little Help needed moving from lying on your back to sitting on the side of a flat bed without using bedrails?: A Little Help needed moving to and from a bed to a chair (including a wheelchair)?: A Little Help needed standing up from a chair using your arms (e.g., wheelchair or bedside chair)?: A Little Help needed to walk in hospital room?: A Little Help needed climbing 3-5 steps with a railing? : A Little 6 Click Score: 18    End of Session Equipment Utilized During Treatment: Gait belt Activity Tolerance: Patient tolerated treatment well Patient left: in bed;with call bell/phone within reach   PT Visit Diagnosis: Muscle weakness (generalized) (M62.81);History of falling (Z91.81);Unsteadiness on feet (R26.81)    Time: 5170-0174 PT Time Calculation (min) (ACUTE ONLY): 27 min   Charges:   PT Evaluation $PT Eval Moderate Complexity: 1 Mod PT Treatments $Gait Training: 8-22 mins          Doreatha Massed, PT Acute Rehabilitation  Office: [phone] Pager: [phone]

## 2020-07-12 NOTE — Assessment & Plan Note (Signed)
Body mass index is 32.59 kg/m.

## 2020-07-12 NOTE — Assessment & Plan Note (Signed)
-   continue SSI and CBG monitoring  

## 2020-07-12 NOTE — Assessment & Plan Note (Signed)
-   baseline Hgb 10-11 g/dL - currently at baseline

## 2020-07-12 NOTE — Assessment & Plan Note (Signed)
Continue statin. 

## 2020-07-12 NOTE — Progress Notes (Signed)
  Echocardiogram 2D Echocardiogram has been performed.  [name] 07/12/2020, 2:16 PM

## 2020-07-12 NOTE — Assessment & Plan Note (Addendum)
-   coumadin on hold in setting of R thigh hematoma and supratherapeutic INR on admission - cardiology may recommend DOAC as well; previously was unaffordable - follow up further anticoag plan once hematoma further improves

## 2020-07-12 NOTE — Progress Notes (Signed)
OT Cancellation Note  Patient Details Name: [name] MRN: [id number] DOB: October 21, 1931   Cancelled Treatment:    Reason Eval/Treat Not Completed: Patient at procedure or test/ unavailable ultrasound in room will check back if able.  Jackelyn Poling OTR/L, [place] Acute Rehabilitation Department Office# [phone] Pager# [phone]   [place] 07/12/2020, 1:28 PM

## 2020-07-12 NOTE — Hospital Course (Addendum)
[name] is an 87 yo male with PMH chronic AF (on Coumadin), dCHF, CKDIIIb, HTN, DMII, chronic venous insufficiency (followed by wound clinic) who presented with SOB.  He has had progressively worsening swelling of his lower extremities and also started complaining of increased swelling in his scrotum. On admission he had described symptoms consistent with orthopnea. He underwent CTA chest which was negative for PE and showed bilateral pleural effusions, left greater than right.  There was also question of consolidation noted in the left lower lobe and lingula.  No discrete mass was appreciated.  Due to his right thigh bruising and swelling he underwent CT of the right lower extremity.  This revealed a hematoma measuring approximately 8 x 3 cm. Bilateral lower extremity duplex also performed on admission which was negative for DVT but had some limitations.  Notable labs on admission included: Troponin 11>>11. BNP 420. BUN 23, Creat 1.74. INR 3.8. Hgb 11.9 g/dL (baseline). Negative covid/flu swabs. Negative PCT.  He was initially started on antibiotics to cover for pneumonia on admission.  He was also started on IV Lasix for diuresis given concern for volume overload.

## 2020-07-12 NOTE — TOC Initial Note (Signed)
Transition of Care [facility]) - Initial/Assessment Note    Patient Details  Name: [name] MRN: [id number] Date of Birth: 10-06-1931  Transition of [facility]) CM/SW Contact:    Dessa Phi, RN Phone Number: 07/12/2020, 2:45 PM  Clinical Narrative: Spoke to patient about d/c plans-return home-he is the caregiver for his spouse. Referral for benefit check await outcome for eliquis vs xarelto.                  Expected Discharge Plan: Home/Self Care Barriers to Discharge: Continued Medical Work up   Patient Goals and CMS Choice Patient states their goals for this hospitalization and ongoing recovery are:: go home CMS Medicare.gov Compare Post Acute Care list provided to:: Patient Choice offered to / list presented to : Patient  Expected Discharge Plan and Services Expected Discharge Plan: Home/Self Care   Discharge Planning Services: CM Consult   Living arrangements for the past 2 months: Single Family Home                                      Prior Living Arrangements/Services Living arrangements for the past 2 months: Single Family Home Lives with:: Spouse Patient language and need for interpreter reviewed:: Yes Do you feel safe going back to the place where you live?: Yes      Need for Family Participation in Patient Care: No (Comment) Care giver support system in place?: Yes (comment) Current home services: DME (cane) Criminal Activity/Legal Involvement Pertinent to Current Situation/Hospitalization: No - Comment as needed  Activities of Daily Living Home Assistive Devices/Equipment: Eyeglasses, Dentures (specify type), Cane (specify quad or straight) (upper denture, single point cane) ADL Screening (condition at time of admission) Patient's cognitive ability adequate to safely complete daily activities?: No Is the patient deaf or have difficulty hearing?: No Does the patient have difficulty seeing, even when wearing glasses/contacts?: No Does the patient  have difficulty concentrating, remembering, or making decisions?: Yes Patient able to express need for assistance with ADLs?: Yes Does the patient have difficulty dressing or bathing?: Yes Independently performs ADLs?: No Communication: Independent Dressing (OT): Needs assistance Is this a change from baseline?: Change from baseline, expected to last >3 days Grooming: Independent Feeding: Independent Bathing: Needs assistance Is this a change from baseline?: Change from baseline, expected to last >3 days Toileting: Needs assistance Is this a change from baseline?: Change from baseline, expected to last >3days In/Out Bed: Needs assistance Is this a change from baseline?: Change from baseline, expected to last >3 days Walks in Home: Needs assistance Is this a change from baseline?: Change from baseline, expected to last >3 days Does the patient have difficulty walking or climbing stairs?: Yes (secondary to weakness) Weakness of Legs: Both Weakness of Arms/Hands: None  Permission Sought/Granted Permission sought to share information with : Case Manager Permission granted to share information with : Yes, Verbal Permission Granted  Share Information with NAME: Case manager           Emotional Assessment Appearance:: Appears stated age Attitude/Demeanor/Rapport: Gracious Affect (typically observed): Accepting Orientation: : Oriented to Self, Oriented to Place, Oriented to  Time, Oriented to Situation Alcohol / Substance Use: Not Applicable Psych Involvement: No (comment)  Admission diagnosis:  CHF (congestive heart failure) (HCC) [I50.9] Peripheral edema [R60.9] Exertional shortness of breath [R06.02] Patient Active Problem List   Diagnosis Date Noted   CAP (community acquired pneumonia) 07/12/2020  Acute on chronic diastolic CHF (congestive heart failure) ([place]) 07/12/2020   Pleural effusion due to CHF (congestive heart failure) ([place]) 07/12/2020   Hepatic congestion 07/12/2020    Acute renal failure superimposed on stage 3b chronic kidney disease ([place]) 07/12/2020   Thigh hematoma, right, initial encounter 07/12/2020   CHF (congestive heart failure) ([place]) 07/11/2020   Weight loss 06/18/2020   Age-related cataract of right eye 79/02/4095   Diastolic dysfunction 35/32/9924   Obesity (BMI 30-39.9) 02/28/2018   B12 deficiency 02/28/2018   Long term (current) use of anticoagulants 09/25/2016   Chronic constipation 04/29/2015   Soft tissue mass 04/29/2015   Chronic venous insufficiency    Encounter for therapeutic drug monitoring 05/28/2014   Essential hypertension 03/07/2014   Preventative health care 03/07/2014   Varicose veins of lower extremities with other complications 26/83/4196   Diabetes mellitus without complication ([place]) 22/29/7989   Normocytic anemia 08/26/2013   Hemorrhoid 02/19/2008   Motion sickness 01/16/2008   Swelling, mass, or lump in head and neck 11/12/2007   Hyperlipidemia 08/13/2006   GOUT 08/13/2006   Atrial fibrillation, chronic ([place]) 08/13/2006   PCP:  Midge Minium, MD Pharmacy:   [id number] - [place], [place] - [address] [place] [address] Phone: [phone] Fax: [phone]     Social Determinants of Health (SDOH) Interventions    Readmission Risk Interventions Readmission Risk Prevention Plan 07/12/2020  Transportation Screening Complete  PCP or Specialist Appt within 5-7 Days Complete  Home Care Screening Complete  Medication Review (RN CM) Complete  Some recent data might be hidden

## 2020-07-13 ENCOUNTER — Encounter (HOSPITAL_COMMUNITY): Payer: Self-pay | Admitting: Internal Medicine

## 2020-07-13 DIAGNOSIS — I482 Chronic atrial fibrillation, unspecified: Secondary | ICD-10-CM

## 2020-07-13 DIAGNOSIS — N170 Acute kidney failure with tubular necrosis: Secondary | ICD-10-CM

## 2020-07-13 DIAGNOSIS — R601 Generalized edema: Secondary | ICD-10-CM

## 2020-07-13 DIAGNOSIS — K761 Chronic passive congestion of liver: Secondary | ICD-10-CM

## 2020-07-13 LAB — BASIC METABOLIC PANEL
Anion gap: 7 (ref 5–15)
BUN: 26 mg/dL — ABNORMAL HIGH (ref 8–23)
CO2: 28 mmol/L (ref 22–32)
Calcium: 8.6 mg/dL — ABNORMAL LOW (ref 8.9–10.3)
Chloride: 104 mmol/L (ref 98–111)
Creatinine, Ser: 1.85 mg/dL — ABNORMAL HIGH (ref 0.61–1.24)
GFR, Estimated: 35 mL/min — ABNORMAL LOW (ref >60)
Glucose, Bld: 99 mg/dL (ref 70–99)
Potassium: 4 mmol/L (ref 3.5–5.1)
Sodium: 139 mmol/L (ref 135–145)

## 2020-07-13 LAB — CBC WITH DIFFERENTIAL/PLATELET
Abs Immature Granulocytes: 0.02 10*3/uL (ref 0.00–0.07)
Basophils Absolute: 0 10*3/uL (ref 0.0–0.1)
Basophils Relative: 1 %
Eosinophils Absolute: 0.2 10*3/uL (ref 0.0–0.5)
Eosinophils Relative: 4 %
HCT: 32.1 % — ABNORMAL LOW (ref 39.0–52.0)
Hemoglobin: 10.4 g/dL — ABNORMAL LOW (ref 13.0–17.0)
Immature Granulocytes: 0 %
Lymphocytes Relative: 16 %
Lymphs Abs: 1 10*3/uL (ref 0.7–4.0)
MCH: 30.7 pg (ref 26.0–34.0)
MCHC: 32.4 g/dL (ref 30.0–36.0)
MCV: 94.7 fL (ref 80.0–100.0)
Monocytes Absolute: 0.7 10*3/uL (ref 0.1–1.0)
Monocytes Relative: 12 %
Neutro Abs: 4.3 10*3/uL (ref 1.7–7.7)
Neutrophils Relative %: 67 %
Platelets: 271 10*3/uL (ref 150–400)
RBC: 3.39 MIL/uL — ABNORMAL LOW (ref 4.22–5.81)
RDW: 17.4 % — ABNORMAL HIGH (ref 11.5–15.5)
WBC: 6.4 10*3/uL (ref 4.0–10.5)
nRBC: 0 % (ref 0.0–0.2)

## 2020-07-13 LAB — GLUCOSE, CAPILLARY
Glucose-Capillary: 103 mg/dL — ABNORMAL HIGH (ref 70–99)
Glucose-Capillary: 91 mg/dL (ref 70–99)
Glucose-Capillary: 106 mg/dL — ABNORMAL HIGH (ref 70–99)
Glucose-Capillary: 120 mg/dL — ABNORMAL HIGH (ref 70–99)

## 2020-07-13 LAB — PROTIME-INR
INR: 3.2 — ABNORMAL HIGH (ref 0.8–1.2)
Prothrombin Time: 33.1 seconds — ABNORMAL HIGH (ref 11.4–15.2)

## 2020-07-13 LAB — MAGNESIUM: Magnesium: 1.9 mg/dL (ref 1.7–2.4)

## 2020-07-13 LAB — PROCALCITONIN: Procalcitonin: <0.1 ng/mL

## 2020-07-13 MED ORDER — GLUCERNA SHAKE PO LIQD
237.0000 mL | Freq: Two times a day (BID) | ORAL | Status: DC
  Administered 2020-07-13 – 2020-07-21 (×14): 237 mL via ORAL
  Filled 2020-07-13 (×17): qty 237

## 2020-07-13 MED ORDER — ALBUMIN HUMAN 25 % IV SOLN
25.0000 g | Freq: Once | INTRAVENOUS | Status: AC
  Administered 2020-07-13: 25 g via INTRAVENOUS
  Filled 2020-07-13: qty 100

## 2020-07-13 NOTE — Progress Notes (Addendum)
Progress Note  Patient Name: [name] Date of Encounter: 07/13/2020  Primary Cardiologist: Sinclair Grooms, MD  Subjective   He is feeling better - SOB improved and he is absolutely shocked that his testicular swelling feels much less tight. He thought this was an issue that could not be resolved. Legs still remain very edematous. Although I/O's are not markedly impressive, he reports excellent UOP.  Inpatient Medications    Scheduled Meds:  allopurinol  300 mg Oral Daily   docusate sodium  100 mg Oral BID   folic acid  1 mg Oral Daily   furosemide  80 mg Intravenous BID   insulin aspart  0-5 Units Subcutaneous QHS   insulin aspart  0-9 Units Subcutaneous TID WC   metoprolol succinate  50 mg Oral Daily   simvastatin  20 mg Oral Daily   sodium chloride flush  3 mL Intravenous Q12H   Continuous Infusions:  sodium chloride     PRN Meds: sodium chloride, acetaminophen **OR** acetaminophen, HYDROcodone-acetaminophen, sodium chloride flush   Vital Signs    Vitals:   07/12/20 1631 07/12/20 1720 07/12/20 2144 07/13/20 0502  BP: (!) 131/52 (!) 139/51 (!) 143/66 (!) 137/59  Pulse: 80 83 82 82  Resp: 18 19 18 18   Temp: 97.8 F (36.6 C) 97.7 F (36.5 C) 97.9 F (36.6 C) 98.2 F (36.8 C)  TempSrc: Oral Oral Oral Oral  SpO2: 100% 100% 97% 95%  Weight:      Height:        Intake/Output Summary (Last 24 hours) at 07/13/2020 0739 Last data filed at 07/13/2020 0155 Gross per 24 hour  Intake 701.99 ml  Output 1725 ml  Net -1023.01 ml   Last 3 Weights 07/11/2020 06/18/2020 07/08/2019  Weight (lbs) 233 lb 11 oz 234 lb 3.2 oz 215 lb  Weight (kg) 106 kg 106.232 kg 97.523 kg     Telemetry    Atrial fib rate controlled - Personally Reviewed  Physical Exam   GEN: No acute distress. Lying 15 degrees in bed sleeping when I walked in HEENT: Normocephalic, atraumatic, sclera non-icteric. Neck: No JVD or bruits. Cardiac: Irregularly irregular, rate controlled, soft SEM LSB,  no rubs or gallops.  Respiratory: Clear to auscultation bilaterally. Breathing is unlabored. GI: Soft, nontender, non-distended, BS +x 4. MS: no deformity. Extremities: No clubbing or cyanosis. 2+ marked BLE remains, pitting Neuro:  AAOx3. Follows commands. Psych:  Responds to questions appropriately with a normal affect.  Labs    High Sensitivity Troponin:   Recent Labs  Lab 07/11/20 1556 07/11/20 1749  TROPONINIHS 11 11      Cardiac EnzymesNo results for input(s): TROPONINI in the last 168 hours. No results for input(s): TROPIPOC in the last 168 hours.   Chemistry Recent Labs  Lab 07/11/20 1556 07/12/20 0427 07/13/20 0437  NA 137 140 139  K 4.4 3.8 4.0  CL 103 104 104  CO2 29 28 28   GLUCOSE 97 106* 99  BUN 23 26* 26*  CREATININE 1.74* 1.54* 1.85*  CALCIUM 8.6* 8.7* 8.6*  PROT 6.5 6.5  --   ALBUMIN 2.9* 3.0*  --   AST 22 23  --   ALT 11 11  --   ALKPHOS 93 93  --   BILITOT 2.4* 2.5*  --   GFRNONAA 37* 43* 35*  ANIONGAP 5 8 7      Hematology Recent Labs  Lab 07/11/20 1749 07/12/20 0427 07/13/20 0437  WBC 6.0 5.8 6.4  RBC 3.86* 3.53*  3.59* 3.39*  HGB 11.9* 11.0* 10.4*  HCT 37.1* 33.7* 32.1*  MCV 96.1 95.5 94.7  MCH 30.8 31.2 30.7  MCHC 32.1 32.6 32.4  RDW 17.4* 17.7* 17.4*  PLT 305 300 271    BNP Recent Labs  Lab 07/11/20 1556  BNP 420.8*     DDimer No results for input(s): DDIMER in the last 168 hours.   Radiology    DG Chest 2 View  Result Date: 07/11/2020 CLINICAL DATA:  Four weeks of lower extremity swelling and edema. Shortness of breath on exertion. EXAM: CHEST - 2 VIEW COMPARISON:  CT abdomen January 21, 2016. FINDINGS: The heart size and mediastinal contours are partially obscured. Aortic atherosclerosis. Moderate hiatal hernia. Moderate left pleural effusion with adjacent parenchymal consolidation versus atelectasis. Right lung is predominantly clear. Prior lower thoracic vertebral augmentation. IMPRESSION: Moderate left pleural  effusion with adjacent parenchymal consolidation versus atelectasis. Electronically Signed   By: Dahlia Bailiff MD   On: 07/11/2020 16:16   CT Angio Chest PE W and/or Wo Contrast  Result Date: 07/11/2020 CLINICAL DATA:  Increasing shortness of breath EXAM: CT ANGIOGRAPHY CHEST WITH CONTRAST TECHNIQUE: Multidetector CT imaging of the chest was performed using the standard protocol during bolus administration of intravenous contrast. Multiplanar CT image reconstructions and MIPs were obtained to evaluate the vascular anatomy. CONTRAST:  44mL OMNIPAQUE IOHEXOL 350 MG/ML SOLN COMPARISON:  Chest x-ray from earlier in the same day. FINDINGS: Cardiovascular: Thoracic aorta demonstrates atherosclerotic calcifications without aneurysmal dilatation. Coronary calcifications are seen. Mild cardiac enlargement is noted. Pulmonary artery is well visualized bilaterally within normal branching pattern. No intraluminal filling defect is identified to suggest pulmonary embolism. Mediastinum/Nodes: Thoracic inlet is within normal limits. No sizable hilar or mediastinal adenopathy is noted. The esophagus as visualized is within normal limits with the exception of a sliding-type hiatal hernia. Lungs/Pleura: Bilateral pleural effusions are noted, left considerably greater than right. Right lung demonstrates basilar lower lobe atelectatic changes. More marked consolidation is noted in the left lower lobe and lingula. No discrete mass is noted on this exam. No sizable parenchymal nodule is seen. Upper Abdomen: Visualized upper abdomen is within normal limits. Musculoskeletal: Degenerative changes of the thoracic spine are noted. No acute rib abnormality is noted. Compression deformity with evidence of prior vertebral augmentation is seen at T12. Mild upper compression deformities are noted which appear chronic in nature. Review of the MIP images confirms the above findings. IMPRESSION: Bilateral pleural effusions left greater than  right with associated consolidation. Hiatal hernia. No evidence of pulmonary emboli Aortic Atherosclerosis (ICD10-I70.0). Electronically Signed   By: Inez Catalina M.D.   On: 07/11/2020 19:42   ECHOCARDIOGRAM COMPLETE  Result Date: 07/12/2020    ECHOCARDIOGRAM REPORT   Patient Name:   KANEN MOTTOLA Date of Exam: 07/12/2020 Medical Rec #:  [id number]      Height:       71.0 in Accession #:    [id number]     Weight:       233.7 lb Date of Birth:  [birth year]      BSA:          2.253 m Patient Age:    89 years       BP:           133/56 mmHg Patient Gender: M              HR:           75 bpm. Exam Location:  Inpatient Procedure: 2D Echo, Cardiac Doppler and Color Doppler Indications:    Dyspnea R06.00  History:        Patient has prior history of Echocardiogram examinations, most                 recent 11/12/2018. Arrythmias:Atrial Fibrillation; Risk                 Factors:Diabetes, Dyslipidemia and Hypertension.  Sonographer:    Bernadene Person RDCS Referring Phys: [place]  1. Left ventricular ejection fraction, by estimation, is 60 to 65%. The left ventricle has normal function. The left ventricle has no regional wall motion abnormalities. Diastolic function indeterminant due to Afib.  2. Right ventricular systolic function is normal. The right ventricular size is normal. There is moderately elevated pulmonary artery systolic pressure. The estimated right ventricular systolic pressure is 69.6 mmHg.  3. Left atrial size was severely dilated.  4. Right atrial size was mildly dilated.  5. Moderate pleural effusion in the left lateral region.  6. The mitral valve is grossly normal. Mild mitral valve regurgitation.  7. The aortic valve is tricuspid. There is mild calcification of the aortic valve. There is moderate thickening of the aortic valve. Aortic valve regurgitation is mild to moderate. Mild to moderate aortic valve sclerosis/calcification is present, without any evidence of aortic  stenosis.  8. Aortic dilatation noted. There is borderline dilatation of the aortic root, measuring 37 mm. There is moderate dilatation of the ascending aorta, measuring 40 mm.  9. The inferior vena cava is dilated in size with <50% respiratory variability, suggesting right atrial pressure of 15 mmHg. Comparison(s): Compared to prior TTE in 11/2018, a left sided moderate pleural effusion is now present. FINDINGS  Left Ventricle: Left ventricular ejection fraction, by estimation, is 60 to 65%. The left ventricle has normal function. The left ventricle has no regional wall motion abnormalities. The left ventricular internal cavity size was normal in size. There is  no left ventricular hypertrophy. Diastolic function indeterminant due to Afib. Right Ventricle: The right ventricular size is normal. No increase in right ventricular wall thickness. Right ventricular systolic function is normal. There is moderately elevated pulmonary artery systolic pressure. The tricuspid regurgitant velocity is 2.92 m/s, and with an assumed right atrial pressure of 15 mmHg, the estimated right ventricular systolic pressure is 78.9 mmHg. Left Atrium: Left atrial size was severely dilated. Right Atrium: Right atrial size was mildly dilated. Pericardium: Trivial pericardial effusion is present. Mitral Valve: The mitral valve is grossly normal. There is mild thickening of the mitral valve leaflet(s). There is mild calcification of the mitral valve leaflet(s). Mild to moderate mitral annular calcification. Mild mitral valve regurgitation. Tricuspid Valve: The tricuspid valve is normal in structure. Tricuspid valve regurgitation is mild. Aortic Valve: The aortic valve is tricuspid. There is mild calcification of the aortic valve. There is moderate thickening of the aortic valve. Aortic valve regurgitation is mild to moderate. Aortic regurgitation PHT measures 351 msec. Mild to moderate aortic valve sclerosis/calcification is present, without  any evidence of aortic stenosis. Aortic valve mean gradient measures 11.7 mmHg. Aortic valve peak gradient measures 19.9 mmHg. Aortic valve area, by VTI measures 2.49 cm. Pulmonic Valve: The pulmonic valve was normal in structure. Pulmonic valve regurgitation is trivial. Aorta: Aortic dilatation noted. There is borderline dilatation of the aortic root, measuring 37 mm. There is moderate dilatation of the ascending aorta, measuring 40 mm. Venous: The inferior vena cava is dilated in size with  less than 50% respiratory variability, suggesting right atrial pressure of 15 mmHg. IAS/Shunts: No atrial level shunt detected by color flow Doppler. Additional Comments: There is a moderate pleural effusion in the left lateral region.  LEFT VENTRICLE PLAX 2D LVIDd:         4.60 cm LVIDs:         2.90 cm LV PW:         2.50 cm LV IVS:        1.00 cm LVOT diam:     2.10 cm LV SV:         120 LV SV Index:   53 LVOT Area:     3.46 cm  RIGHT VENTRICLE RV S prime:     12.30 cm/s TAPSE (M-mode): 2.1 cm LEFT ATRIUM              Index       RIGHT ATRIUM           Index LA diam:        6.20 cm  2.75 cm/m  RA Area:     26.40 cm LA Vol (A2C):   175.0 ml 77.68 ml/m RA Volume:   79.10 ml  35.11 ml/m LA Vol (A4C):   171.0 ml 75.91 ml/m LA Biplane Vol: 178.0 ml 79.01 ml/m  AORTIC VALVE AV Area (Vmax):    2.30 cm AV Area (Vmean):   2.20 cm AV Area (VTI):     2.49 cm AV Vmax:           223.00 cm/s AV Vmean:          158.667 cm/s AV VTI:            0.483 m AV Peak Grad:      19.9 mmHg AV Mean Grad:      11.7 mmHg LVOT Vmax:         148.00 cm/s LVOT Vmean:        101.000 cm/s LVOT VTI:          0.347 m LVOT/AV VTI ratio: 0.72 AI PHT:            351 msec  AORTA Ao Root diam: 3.70 cm TRICUSPID VALVE TR Peak grad:   34.1 mmHg TR Vmax:        292.00 cm/s  SHUNTS Systemic VTI:  0.35 m Systemic Diam: 2.10 cm Gwyndolyn Kaufman MD Electronically signed by Gwyndolyn Kaufman MD Signature Date/Time: 07/12/2020/4:03:37 PM    Final    CT EXTREMITY  LOWER RIGHT WO CONTRAST  Result Date: 07/12/2020 CLINICAL DATA:  Soft tissue mass of thigh.  Spontaneous hemorrhage EXAM: CT OF THE LOWER RIGHT EXTREMITY WITHOUT CONTRAST TECHNIQUE: Multidetector CT imaging of the right lower extremity was performed according to the standard protocol. COMPARISON:  None. FINDINGS: Bones/Joint/Cartilage Negative for fracture, erosion, or bone lesion Ligaments Suboptimally assessed by CT. Muscles and Tendons Heterogeneously high-density mass in the medial compartment thigh musculature, elongated along muscle fibers and measuring 8 cm in length by approximately 3.3 cm in diameter. Soft tissues Marked generalized subcutaneous edema which is symmetric in the visible lower extremities and especially marked in the scrotum. No soft tissue gas or subcutaneous mass/hematoma. IMPRESSION: 1. High-density mass in the medial compartment right thigh that is likely hematoma, ~8 x 3 cm. The location is deep for visible bruising and the CT appearance is nonspecific, consider follow-up in few months to ensure resolution and exclude a mass. 2. Marked anasarca. Electronically Signed   By: Angelica Chessman  Watts M.D.   On: 07/12/2020 04:24   VAS [place] LOWER EXTREMITY VENOUS (DVT) (MC and WL 7a-7p)  Result Date: 07/12/2020  Lower Venous DVT Study Patient Name:  JULIAS MOULD  Date of Exam:   07/11/2020 Medical Rec #: [id number]       Accession #:    [id number] Date of Birth: 1931-12-06       Patient Gender: M Patient Age:   89Y Exam Location:  [facility] Procedure:      VAS [place] LOWER EXTREMITY VENOUS (DVT) Referring Phys: [id number] [place] --------------------------------------------------------------------------------  Indications: Swelling, Edema, and Pain.  Limitations: Poor ultrasound/tissue interface. Comparison Study: no prior Performing Technologist: Archie Patten RVS  Examination Guidelines: A complete evaluation includes B-mode imaging, spectral Doppler, color Doppler, and power  Doppler as needed of all accessible portions of each vessel. Bilateral testing is considered an integral part of a complete examination. Limited examinations for reoccurring indications may be performed as noted. The reflux portion of the exam is performed with the patient in reverse Trendelenburg.  +---------+---------------+---------+-----------+----------+-------------------+ RIGHT    CompressibilityPhasicitySpontaneityPropertiesThrombus Aging      +---------+---------------+---------+-----------+----------+-------------------+ CFV      Full           Yes      Yes                                      +---------+---------------+---------+-----------+----------+-------------------+ SFJ      Full                                                             +---------+---------------+---------+-----------+----------+-------------------+ FV Prox  Full                                                             +---------+---------------+---------+-----------+----------+-------------------+ FV Mid                  Yes      Yes                                      +---------+---------------+---------+-----------+----------+-------------------+ FV Distal               Yes      Yes                                      +---------+---------------+---------+-----------+----------+-------------------+ PFV      Full                                                             +---------+---------------+---------+-----------+----------+-------------------+ POP      Full  Yes      Yes                                      +---------+---------------+---------+-----------+----------+-------------------+ PTV      Full                                                             +---------+---------------+---------+-----------+----------+-------------------+ PERO                                                  Not well visualized  +---------+---------------+---------+-----------+----------+-------------------+   +---------+---------------+---------+-----------+----------+-------------------+ LEFT     CompressibilityPhasicitySpontaneityPropertiesThrombus Aging      +---------+---------------+---------+-----------+----------+-------------------+ CFV      Full           Yes      Yes                                      +---------+---------------+---------+-----------+----------+-------------------+ SFJ      Full                                                             +---------+---------------+---------+-----------+----------+-------------------+ FV Prox  Full                                                             +---------+---------------+---------+-----------+----------+-------------------+ FV Mid   Full                                                             +---------+---------------+---------+-----------+----------+-------------------+ FV DistalFull                                                             +---------+---------------+---------+-----------+----------+-------------------+ PFV      Full                                                             +---------+---------------+---------+-----------+----------+-------------------+ POP      Full           Yes      Yes                                      +---------+---------------+---------+-----------+----------+-------------------+  PTV      Full                                                             +---------+---------------+---------+-----------+----------+-------------------+ PERO                                                  Not well visualized +---------+---------------+---------+-----------+----------+-------------------+     Summary: RIGHT: - There is no evidence of deep vein thrombosis in the lower extremity. However, portions of this examination were limited- see technologist  comments above.  - No cystic structure found in the popliteal fossa.  LEFT: - There is no evidence of deep vein thrombosis in the lower extremity. However, portions of this examination were limited- see technologist comments above.  - No cystic structure found in the popliteal fossa.  *See table(s) above for measurements and observations. Electronically signed by Monica Martinez MD on 07/12/2020 at 10:25:31 AM.    Final     Cardiac Studies   2D Echo 07/12/20  1. Left ventricular ejection fraction, by estimation, is 60 to 65%. The  left ventricle has normal function. The left ventricle has no regional  wall motion abnormalities. Diastolic function indeterminant due to Afib.   2. Right ventricular systolic function is normal. The right ventricular  size is normal. There is moderately elevated pulmonary artery systolic  pressure. The estimated right ventricular systolic pressure is 78.2 mmHg.   3. Left atrial size was severely dilated.   4. Right atrial size was mildly dilated.   5. Moderate pleural effusion in the left lateral region.   6. The mitral valve is grossly normal. Mild mitral valve regurgitation.   7. The aortic valve is tricuspid. There is mild calcification of the  aortic valve. There is moderate thickening of the aortic valve. Aortic  valve regurgitation is mild to moderate. Mild to moderate aortic valve  sclerosis/calcification is present,  without any evidence of aortic stenosis.   8. Aortic dilatation noted. There is borderline dilatation of the aortic  root, measuring 37 mm. There is moderate dilatation of the ascending  aorta, measuring 40 mm.   9. The inferior vena cava is dilated in size with <50% respiratory  variability, suggesting right atrial pressure of 15 mmHg.   Comparison(s): Compared to prior TTE in 11/2018, a left sided moderate  pleural effusion is now present.   Patient Profile     87 y.o. male  with chronic diastolic CHF, chronic atrial fibrillation,  moderate AI by echo 11/2018, prior liver abnormalities on imaging, anemia, CKD stage III (borderline a-b, baseline Cr appears 1.5-1.9), varicose veins/chronic venous insufficiency followed by wound clinic, DM, HTN, HLD, GIB/stercoral ulcer of rectum 2018, vitamin B 12 deficiency who presented to the hospital with several week history of increasing SOB, marked LEE and scrotal edema. (Of note: pt recalls someone pointing to his leg and saying he had a blood clot a few weeks ago, but no imaging at that time and LE venous duplex negative for DVT here.) He did sustain a fall several weeks ago as well and imaging this admission shows a thigh hematoma.  Assessment & Plan  1. Lower extremity edema, scrotal edema, and pleural effusions suspected due to acute on chronic diastolic CHF - suspect testicular swelling was more of a sequelae of anasarca rather than intrinsic urologic process - 2D echo EF 60-65%, no RWMA, moderate pulmonary HTN, severe LAE, mild RAE, mild MR, mild-moderate AI, borderline dilation of aortic root and moderate dilation of ascending aorta - modest output by I/O's since they began tracking, but weight does appear to be decreasing from 233lb on 7/10 to 227.7 today - pending review of diuretic plan with MD, but continue this AM - consider GI eval as well given prior question of cirrhosis, elevated bilirubin and hypoalbuminemia - albumin level may be driving some of his fluid shifts   2. Chronic atrial fibrillation - managed with rate control strategy, remains rate controlled on Toprol 50mg  daily - was on warfarin prior to admission, last INR check 04/2020, intermittently supratherapeutic - consideration should be given to changing to DOAC this admission - was previously cost prohibitive 5 years ago so care management order is pending to assess cost options - warfarin currently on hold due to thigh hematoma, await medical clearance on when this can be resumed (INR remains 3.2)   3. Aortic  insufficiency by echo - 2D echo this admission shows mild-moderate AI and aortic sclerosis without stenosis   4. CKD stage IIIb, baseline Cr appears 1.5-1.9 range - admit value 1.74 -> 1.57 -> 1.85 - slight bump compared to yesterday but still within range from prior baseline, so will not make any acute changes - hold ACEi for diuresis in context of CKD  5. Possible CAP - question of consolidation on CXR/CT, will defer abx to primary team   6. Recent fall with thigh hematoma - agree with PT/OT, currently recommended for HHPT and 24-hour supervision - ongoing outpatient f/u will be needed to assess ongoing fall risk - anticoag on hold as above - per CT, recommend f/u scan in several months to ensure resolution and exclude mass  7. Normocytic anemia - prior baseline in the 10-11 range, today 10.4, need to continue to follow   8. Aortic dilatation - borderline dilatation of the aortic root, measuring 37 mm, and moderate dilatation of ascending aorta measuring 26mm - OP monitoring if clinically appropriate at that time  For questions or updates, please contact [place] Please consult [email or web address] for contact info under Cardiology/STEMI.  Signed, Charlie Pitter, PA-C 07/13/2020, 7:39 AM

## 2020-07-13 NOTE — Consult Note (Signed)
[facility] Inpatient Consult   07/13/2020  RONEN BROMWELL 10-Dec-1931 [id number]  [employer] [facility]] Patient: [employer]   Patient evaluated for community based chronic complex disease management services with [place] [facility] as a benefit of patient's [employer]. Per review, patient primary provider office is Therapist, music at [employer]. PCP office offers embedded chronic case management services (CCM).  Went to bedside to speak with patient and explain outpatient CCM services and assess for needs. Patient was eating lunch, so I requested to return to speak with him a later time.  Plan: Will continue to follow for progression.  Of note, [facility] Care Management services does not replace or interfere with any services that are arranged by inpatient case management or social work.   Netta Cedars, MSN, [facility] Liaison Nurse Mobile Phone [phone]  Toll free office [phone]

## 2020-07-13 NOTE — TOC Progression Note (Addendum)
Transition of Care [facility]) - Progression Note    Patient Details  Name: [name] MRN: [id number] Date of Birth: 1931/11/26  Transition of Care [facility]) CM/SW Contact  Kaizen Ibsen, Juliann Pulse, RN Phone Number: 07/13/2020, 1:01 PM  Clinical Narrative: Damaris Schooner to dtr Debbie-agree to DME recc-rw,tub bench-[employer] rep aware to deliver to rm prior d/c.Will await [place] agency to accept-dtr had no preference.  1:26p-[facility] agency able to provide HHPT-rep Ronalee Belts aware.     Expected Discharge Plan: [place] Barriers to Discharge: Continued Medical Work up  Expected Discharge Plan and Services Expected Discharge Plan: [place]   Discharge Planning Services: CM Consult   Living arrangements for the past 2 months: Single Family Home                 DME Arranged: Tub bench, [place] rolling DME Agency: [employer] Date DME Agency Contacted: 07/13/20 Time DME Agency Contacted: [phone] Representative spoke with at DME Agency: [place] ([place]) Interventions    Readmission Risk Interventions Readmission Risk Prevention Plan 07/12/2020  Transportation Screening Complete  PCP or Specialist Appt within 5-7 Days Complete  Home Care Screening Complete  Medication Review (RN CM) Complete  Some recent data might be hidden

## 2020-07-13 NOTE — Progress Notes (Addendum)
Progress Note    SHONTA PHILLIS   FVC:[id number]  DOB: [birth year]  DOA: 07/11/2020     2  PCP: Midge Minium, MD  CC: SOB, swelling  Hospital Course: Mr. Marro is an 87 yo male with PMH chronic AF (on Coumadin), dCHF, CKDIIIb, HTN, DMII, chronic venous insufficiency (followed by wound clinic) who presented with SOB.  He has had progressively worsening swelling of his lower extremities and also started complaining of increased swelling in his scrotum. On admission he had described symptoms consistent with orthopnea. He underwent CTA chest which was negative for PE and showed bilateral pleural effusions, left greater than right.  There was also question of consolidation noted in the left lower lobe and lingula.  No discrete mass was appreciated.  Due to his right thigh bruising and swelling he underwent CT of the right lower extremity.  This revealed a hematoma measuring approximately 8 x 3 cm. Bilateral lower extremity duplex also performed on admission which was negative for DVT but had some limitations.  Notable labs on admission included: Troponin 11>>11. BNP 420. BUN 23, Creat 1.74. INR 3.8. Hgb 11.9 g/dL (baseline). Negative covid/flu swabs. Negative PCT.  He was initially started on antibiotics to cover for pneumonia on admission.  He was also started on IV Lasix for diuresis given concern for volume overload.  Interval History:  No events overnight.  States that he is voiding well with the Lasix but urine output not as high as expected on charting. Understands ongoing plan for further IV diuresis while in the hospital. He is still short of breath some in bed and noted to have some ongoing tachypnea since yesterday evening.  ROS: Constitutional: negative for chills and fevers, Respiratory: positive for dyspnea on exertion and SOB when flat, negative for sputum and wheezing, Cardiovascular: negative for chest pain, and Gastrointestinal: negative for abdominal  pain  Assessment & Plan: Acute on chronic diastolic CHF (congestive heart failure) ([place]) - last echo 11/2018: EF 60-65%, dilated LA, RA concerning for DD - now with significant LE edema, scrotal edema, SOB - follow up repeat echo: EF 60-65%, severely dilated LA; mildly dilated RA -Cardiology following, appreciate assistance - Continue Lasix per cardiology recs - still appears a little dyspneic in bed; his RR's have been elevated even still today, as high as 33 but also noted 27-28 over past 24 hours  Acute renal failure superimposed on stage 3b chronic kidney disease ([place]) - patient has history of CKD3b. Baseline creat ~ 1.4 - 1.5, eGFR 35 - 41 - patient presents with increase in creat >0.3 mg/dL above baseline presumed to have occurred within past 7 days PTA - creat 1.74 on admission - etiology presumed from CRS - continue lasix and monitor renal function on diuresis - strict I&O   Pleural effusion due to CHF (congestive heart failure) (HCC) - see CHF - continue diuresis - low suspicion for CAP at this time; left consolidation noted on CTA; follow up imaging may be needed after sufficient diuresis - PCT negative, afebrile, no leukocytosis, no cough/sputum - d/c abx and monitor clinically   Hepatic congestion - suspect elevated Tbili consistent with congestive hepatopathy - increased mortality associated  Atrial fibrillation, chronic (HCC) - coumadin on hold in setting of R thigh hematoma and supratherapeutic INR on admission - cardiology may recommend DOAC as well; previously was unaffordable - follow up further anticoag plan once hematoma further improves  Thigh hematoma, right, initial encounter - Duplex negative for DVT - Legs are  edematous bilaterally -Coumadin on hold - CT shows approximately 8 x 3 cm hematoma - continue neurovas checks qshift  Obesity (BMI 30-39.9) Body mass index is 32.59 kg/m.  Chronic venous insufficiency - follows outpatient with wound  center   Essential hypertension - Monitor blood pressure while on diuresis - Continue Toprol  Normocytic anemia - baseline Hgb 10-11 g/dL - currently at baseline   Diabetes mellitus without complication (HCC) - continue SSI and CBG monitoring  Hyperlipidemia - Continue statin   Old records reviewed in assessment of this patient  Antimicrobials:   DVT prophylaxis: Place and maintain sequential compression device Start: 07/12/20 0139   Code Status:   Code Status: DNR Family Communication:   Disposition Plan: Status is: Inpatient  Remains inpatient appropriate because:Ongoing diagnostic testing needed not appropriate for outpatient work up, IV treatments appropriate due to intensity of illness or inability to take PO, and Inpatient level of care appropriate due to severity of illness  Dispo: The patient is from: Home              Anticipated d/c is to: Home              Patient currently is not medically stable to d/c.   Difficult to place patient No  Risk of unplanned readmission score: Unplanned Admission- Pilot do not use: 18.94   Objective: Blood pressure (!) 137/59, pulse 82, temperature 98.2 F (36.8 C), temperature source Oral, resp. rate 18, height _0  (1.803 m), weight 103.3 kg, SpO2 95 %.  Examination: General appearance: alert, cooperative, and no distress Head: Normocephalic, without obvious abnormality, atraumatic Eyes:  EOMI Lungs:  Bilateral crackles, decreased left basilar lung sounds Heart: regular rate and rhythm, S1, S2 normal, and 3/6 HSM best heard in LLSB Abdomen:  obese, soft, BS present GU: edematous scrotum appox grapefruit sized  Extremities:  3+ LE edema bilaterally up to thighs; right thigh noted with large bruising ; unable to palpate DP pulses Skin:  frail skin in UE with skin tears Neurologic: no focal deficits  Consultants:  Cardiology  Procedures:    Data Reviewed: I have personally reviewed following labs and imaging  studies Results for orders placed or performed during the hospital encounter of 07/11/20 (from the past 24 hour(s))  Urinalysis, Routine w reflex microscopic Urine, Clean Catch     Status: None   Collection Time: 07/12/20  4:44 PM  Result Value Ref Range   Color, Urine YELLOW YELLOW   APPearance CLEAR CLEAR   Specific Gravity, Urine 1.012 1.005 - 1.030   pH 6.0 5.0 - 8.0   Glucose, UA NEGATIVE NEGATIVE mg/dL   Hgb urine dipstick NEGATIVE NEGATIVE   Bilirubin Urine NEGATIVE NEGATIVE   Ketones, ur NEGATIVE NEGATIVE mg/dL   Protein, ur NEGATIVE NEGATIVE mg/dL   Nitrite NEGATIVE NEGATIVE   Leukocytes,Ua NEGATIVE NEGATIVE   WBC, UA 0-5 0 - 5 WBC/hpf   Bacteria, UA NONE SEEN NONE SEEN   Squamous Epithelial / LPF 0-5 0 - 5   Mucus PRESENT    Hyaline Casts, UA PRESENT   Glucose, capillary     Status: Abnormal   Collection Time: 07/12/20  6:10 PM  Result Value Ref Range   Glucose-Capillary 110 (H) 70 - 99 mg/dL  Glucose, capillary     Status: Abnormal   Collection Time: 07/12/20 10:29 PM  Result Value Ref Range   Glucose-Capillary 132 (H) 70 - 99 mg/dL  Procalcitonin     Status: None   Collection Time:  07/13/20  4:37 AM  Result Value Ref Range   Procalcitonin <0.10 ng/mL  Protime-INR     Status: Abnormal   Collection Time: 07/13/20  4:37 AM  Result Value Ref Range   Prothrombin Time 33.1 (H) 11.4 - 15.2 seconds   INR 3.2 (H) 0.8 - 1.2  Basic metabolic panel     Status: Abnormal   Collection Time: 07/13/20  4:37 AM  Result Value Ref Range   Sodium 139 135 - 145 mmol/L   Potassium 4.0 3.5 - 5.1 mmol/L   Chloride 104 98 - 111 mmol/L   CO2 28 22 - 32 mmol/L   Glucose, Bld 99 70 - 99 mg/dL   BUN 26 (H) 8 - 23 mg/dL   Creatinine, Ser 1.85 (H) 0.61 - 1.24 mg/dL   Calcium 8.6 (L) 8.9 - 10.3 mg/dL   GFR, Estimated 35 (L) >60 mL/min   Anion gap 7 5 - 15  CBC with Differential/Platelet     Status: Abnormal   Collection Time: 07/13/20  4:37 AM  Result Value Ref Range   WBC 6.4 4.0 -  10.5 K/uL   RBC 3.39 (L) 4.22 - 5.81 MIL/uL   Hemoglobin 10.4 (L) 13.0 - 17.0 g/dL   HCT 32.1 (L) 39.0 - 52.0 %   MCV 94.7 80.0 - 100.0 fL   MCH 30.7 26.0 - 34.0 pg   MCHC 32.4 30.0 - 36.0 g/dL   RDW 17.4 (H) 11.5 - 15.5 %   Platelets 271 150 - 400 K/uL   nRBC 0.0 0.0 - 0.2 %   Neutrophils Relative % 67 %   Neutro Abs 4.3 1.7 - 7.7 K/uL   Lymphocytes Relative 16 %   Lymphs Abs 1.0 0.7 - 4.0 K/uL   Monocytes Relative 12 %   Monocytes Absolute 0.7 0.1 - 1.0 K/uL   Eosinophils Relative 4 %   Eosinophils Absolute 0.2 0.0 - 0.5 K/uL   Basophils Relative 1 %   Basophils Absolute 0.0 0.0 - 0.1 K/uL   Immature Granulocytes 0 %   Abs Immature Granulocytes 0.02 0.00 - 0.07 K/uL  Magnesium     Status: None   Collection Time: 07/13/20  4:37 AM  Result Value Ref Range   Magnesium 1.9 1.7 - 2.4 mg/dL  Glucose, capillary     Status: Abnormal   Collection Time: 07/13/20  7:35 AM  Result Value Ref Range   Glucose-Capillary 103 (H) 70 - 99 mg/dL  Glucose, capillary     Status: None   Collection Time: 07/13/20 11:05 AM  Result Value Ref Range   Glucose-Capillary 91 70 - 99 mg/dL    Recent Results (from the past 240 hour(s))  Resp Panel by RT-PCR (Flu A&B, Covid) Nasopharyngeal Swab     Status: None   Collection Time: 07/11/20  6:10 PM   Specimen: Nasopharyngeal Swab; Nasopharyngeal(NP) swabs in vial transport medium  Result Value Ref Range Status   SARS Coronavirus 2 by RT PCR NEGATIVE NEGATIVE Final    Comment: (NOTE) SARS-CoV-2 target nucleic acids are NOT DETECTED.  The SARS-CoV-2 RNA is generally detectable in upper respiratory specimens during the acute phase of infection. The lowest concentration of SARS-CoV-2 viral copies this assay can detect is 138 copies/mL. A negative result does not preclude SARS-Cov-2 infection and should not be used as the sole basis for treatment or other patient management decisions. A negative result may occur with  improper specimen  collection/handling, submission of specimen other than nasopharyngeal swab, presence of viral  mutation(s) within the areas targeted by this assay, and inadequate number of viral copies(<138 copies/mL). A negative result must be combined with clinical observations, patient history, and epidemiological information. The expected result is Negative.  Fact Sheet for Patients:  EntrepreneurPulse.com.au  Fact Sheet for Healthcare Providers:  IncredibleEmployment.be  This test is no t yet approved or cleared by the [place] FDA and  has been authorized for detection and/or diagnosis of SARS-CoV-2 by FDA under an Emergency Use Authorization (EUA). This EUA will remain  in effect (meaning this test can be used) for the duration of the COVID-19 declaration under Section 564(b)(1) of the Act, 21 U.S.C.section 360bbb-3(b)(1), unless the authorization is terminated  or revoked sooner.       Influenza A by PCR NEGATIVE NEGATIVE Final   Influenza B by PCR NEGATIVE NEGATIVE Final    Comment: (NOTE) The Xpert Xpress SARS-CoV-2/FLU/RSV plus assay is intended as an aid in the diagnosis of influenza from Nasopharyngeal swab specimens and should not be used as a sole basis for treatment. Nasal washings and aspirates are unacceptable for Xpert Xpress SARS-CoV-2/FLU/RSV testing.  Fact Sheet for Patients: EntrepreneurPulse.com.au  Fact Sheet for Healthcare Providers: IncredibleEmployment.be  This test is not yet approved or cleared by the [place] FDA and has been authorized for detection and/or diagnosis of SARS-CoV-2 by FDA under an Emergency Use Authorization (EUA). This EUA will remain in effect (meaning this test can be used) for the duration of the COVID-19 declaration under Section 564(b)(1) of the Act, 21 U.S.C. section 360bbb-3(b)(1), unless the authorization is terminated or revoked.  Performed at [facility], [place] [address], [place], [place] [address]      Radiology Studies: DG Chest 2 View  Result Date: 07/11/2020 CLINICAL DATA:  Four weeks of lower extremity swelling and edema. Shortness of breath on exertion. EXAM: CHEST - 2 VIEW COMPARISON:  CT abdomen January 21, 2016. FINDINGS: The heart size and mediastinal contours are partially obscured. Aortic atherosclerosis. Moderate hiatal hernia. Moderate left pleural effusion with adjacent parenchymal consolidation versus atelectasis. Right lung is predominantly clear. Prior lower thoracic vertebral augmentation. IMPRESSION: Moderate left pleural effusion with adjacent parenchymal consolidation versus atelectasis. Electronically Signed   By: Dahlia Bailiff MD   On: 07/11/2020 16:16   CT Angio Chest PE W and/or Wo Contrast  Result Date: 07/11/2020 CLINICAL DATA:  Increasing shortness of breath EXAM: CT ANGIOGRAPHY CHEST WITH CONTRAST TECHNIQUE: Multidetector CT imaging of the chest was performed using the standard protocol during bolus administration of intravenous contrast. Multiplanar CT image reconstructions and MIPs were obtained to evaluate the vascular anatomy. CONTRAST:  26m OMNIPAQUE IOHEXOL 350 MG/ML SOLN COMPARISON:  Chest x-ray from earlier in the same day. FINDINGS: Cardiovascular: Thoracic aorta demonstrates atherosclerotic calcifications without aneurysmal dilatation. Coronary calcifications are seen. Mild cardiac enlargement is noted. Pulmonary artery is well visualized bilaterally within normal branching pattern. No intraluminal filling defect is identified to suggest pulmonary embolism. Mediastinum/Nodes: Thoracic inlet is within normal limits. No sizable hilar or mediastinal adenopathy is noted. The esophagus as visualized is within normal limits with the exception of a sliding-type hiatal hernia. Lungs/Pleura: Bilateral pleural effusions are noted, left considerably greater than right. Right lung demonstrates basilar  lower lobe atelectatic changes. More marked consolidation is noted in the left lower lobe and lingula. No discrete mass is noted on this exam. No sizable parenchymal nodule is seen. Upper Abdomen: Visualized upper abdomen is within normal limits. Musculoskeletal: Degenerative changes of the thoracic spine are noted.  No acute rib abnormality is noted. Compression deformity with evidence of prior vertebral augmentation is seen at T12. Mild upper compression deformities are noted which appear chronic in nature. Review of the MIP images confirms the above findings. IMPRESSION: Bilateral pleural effusions left greater than right with associated consolidation. Hiatal hernia. No evidence of pulmonary emboli Aortic Atherosclerosis (ICD10-I70.0). Electronically Signed   By: Inez Catalina M.D.   On: 07/11/2020 19:42   ECHOCARDIOGRAM COMPLETE  Result Date: 07/12/2020    ECHOCARDIOGRAM REPORT   Patient Name:   LANGFORD CARIAS Date of Exam: 07/12/2020 Medical Rec #:  [id number]      Height:       71.0 in Accession #:    [id number]     Weight:       233.7 lb Date of Birth:  [birth year]      BSA:          2.253 m Patient Age:    58 years       BP:           133/56 mmHg Patient Gender: M              HR:           75 bpm. Exam Location:  Inpatient Procedure: 2D Echo, Cardiac Doppler and Color Doppler Indications:    Dyspnea R06.00  History:        Patient has prior history of Echocardiogram examinations, most                 recent 11/12/2018. Arrythmias:Atrial Fibrillation; Risk                 Factors:Diabetes, Dyslipidemia and Hypertension.  Sonographer:    Bernadene Person RDCS Referring Phys: [place]  1. Left ventricular ejection fraction, by estimation, is 60 to 65%. The left ventricle has normal function. The left ventricle has no regional wall motion abnormalities. Diastolic function indeterminant due to Afib.  2. Right ventricular systolic function is normal. The right ventricular size is normal.  There is moderately elevated pulmonary artery systolic pressure. The estimated right ventricular systolic pressure is 82.9 mmHg.  3. Left atrial size was severely dilated.  4. Right atrial size was mildly dilated.  5. Moderate pleural effusion in the left lateral region.  6. The mitral valve is grossly normal. Mild mitral valve regurgitation.  7. The aortic valve is tricuspid. There is mild calcification of the aortic valve. There is moderate thickening of the aortic valve. Aortic valve regurgitation is mild to moderate. Mild to moderate aortic valve sclerosis/calcification is present, without any evidence of aortic stenosis.  8. Aortic dilatation noted. There is borderline dilatation of the aortic root, measuring 37 mm. There is moderate dilatation of the ascending aorta, measuring 40 mm.  9. The inferior vena cava is dilated in size with <50% respiratory variability, suggesting right atrial pressure of 15 mmHg. Comparison(s): Compared to prior TTE in 11/2018, a left sided moderate pleural effusion is now present. FINDINGS  Left Ventricle: Left ventricular ejection fraction, by estimation, is 60 to 65%. The left ventricle has normal function. The left ventricle has no regional wall motion abnormalities. The left ventricular internal cavity size was normal in size. There is  no left ventricular hypertrophy. Diastolic function indeterminant due to Afib. Right Ventricle: The right ventricular size is normal. No increase in right ventricular wall thickness. Right ventricular systolic function is normal. There is moderately elevated pulmonary artery systolic pressure. The tricuspid regurgitant velocity  is 2.92 m/s, and with an assumed right atrial pressure of 15 mmHg, the estimated right ventricular systolic pressure is 31.5 mmHg. Left Atrium: Left atrial size was severely dilated. Right Atrium: Right atrial size was mildly dilated. Pericardium: Trivial pericardial effusion is present. Mitral Valve: The mitral valve is  grossly normal. There is mild thickening of the mitral valve leaflet(s). There is mild calcification of the mitral valve leaflet(s). Mild to moderate mitral annular calcification. Mild mitral valve regurgitation. Tricuspid Valve: The tricuspid valve is normal in structure. Tricuspid valve regurgitation is mild. Aortic Valve: The aortic valve is tricuspid. There is mild calcification of the aortic valve. There is moderate thickening of the aortic valve. Aortic valve regurgitation is mild to moderate. Aortic regurgitation PHT measures 351 msec. Mild to moderate aortic valve sclerosis/calcification is present, without any evidence of aortic stenosis. Aortic valve mean gradient measures 11.7 mmHg. Aortic valve peak gradient measures 19.9 mmHg. Aortic valve area, by VTI measures 2.49 cm. Pulmonic Valve: The pulmonic valve was normal in structure. Pulmonic valve regurgitation is trivial. Aorta: Aortic dilatation noted. There is borderline dilatation of the aortic root, measuring 37 mm. There is moderate dilatation of the ascending aorta, measuring 40 mm. Venous: The inferior vena cava is dilated in size with less than 50% respiratory variability, suggesting right atrial pressure of 15 mmHg. IAS/Shunts: No atrial level shunt detected by color flow Doppler. Additional Comments: There is a moderate pleural effusion in the left lateral region.  LEFT VENTRICLE PLAX 2D LVIDd:         4.60 cm LVIDs:         2.90 cm LV PW:         2.50 cm LV IVS:        1.00 cm LVOT diam:     2.10 cm LV SV:         120 LV SV Index:   53 LVOT Area:     3.46 cm  RIGHT VENTRICLE RV S prime:     12.30 cm/s TAPSE (M-mode): 2.1 cm LEFT ATRIUM              Index       RIGHT ATRIUM           Index LA diam:        6.20 cm  2.75 cm/m  RA Area:     26.40 cm LA Vol (A2C):   175.0 ml 77.68 ml/m RA Volume:   79.10 ml  35.11 ml/m LA Vol (A4C):   171.0 ml 75.91 ml/m LA Biplane Vol: 178.0 ml 79.01 ml/m  AORTIC VALVE AV Area (Vmax):    2.30 cm AV Area  (Vmean):   2.20 cm AV Area (VTI):     2.49 cm AV Vmax:           223.00 cm/s AV Vmean:          158.667 cm/s AV VTI:            0.483 m AV Peak Grad:      19.9 mmHg AV Mean Grad:      11.7 mmHg LVOT Vmax:         148.00 cm/s LVOT Vmean:        101.000 cm/s LVOT VTI:          0.347 m LVOT/AV VTI ratio: 0.72 AI PHT:            351 msec  AORTA Ao Root diam: 3.70 cm TRICUSPID VALVE TR Peak grad:  34.1 mmHg TR Vmax:        292.00 cm/s  SHUNTS Systemic VTI:  0.35 m Systemic Diam: 2.10 cm Gwyndolyn Kaufman MD Electronically signed by Gwyndolyn Kaufman MD Signature Date/Time: 07/12/2020/4:03:37 PM    Final    CT EXTREMITY LOWER RIGHT WO CONTRAST  Result Date: 07/12/2020 CLINICAL DATA:  Soft tissue mass of thigh.  Spontaneous hemorrhage EXAM: CT OF THE LOWER RIGHT EXTREMITY WITHOUT CONTRAST TECHNIQUE: Multidetector CT imaging of the right lower extremity was performed according to the standard protocol. COMPARISON:  None. FINDINGS: Bones/Joint/Cartilage Negative for fracture, erosion, or bone lesion Ligaments Suboptimally assessed by CT. Muscles and Tendons Heterogeneously high-density mass in the medial compartment thigh musculature, elongated along muscle fibers and measuring 8 cm in length by approximately 3.3 cm in diameter. Soft tissues Marked generalized subcutaneous edema which is symmetric in the visible lower extremities and especially marked in the scrotum. No soft tissue gas or subcutaneous mass/hematoma. IMPRESSION: 1. High-density mass in the medial compartment right thigh that is likely hematoma, ~8 x 3 cm. The location is deep for visible bruising and the CT appearance is nonspecific, consider follow-up in few months to ensure resolution and exclude a mass. 2. Marked anasarca. Electronically Signed   By: Monte Fantasia M.D.   On: 07/12/2020 04:24   VAS [place] LOWER EXTREMITY VENOUS (DVT) ([facility] and [facility] 7a-7p)  Result Date: 07/12/2020  Lower Venous DVT Study Patient Name:  KYRIAN STAGE  Date of Exam:    07/11/2020 Medical Rec #: [id number]       Accession #:    [id number] Date of Birth: 01-Nov-1931       Patient Gender: M Patient Age:   89Y Exam Location:  [facility] Procedure:      VAS [place] LOWER EXTREMITY VENOUS (DVT) Referring Phys: [id number] [place] --------------------------------------------------------------------------------  Indications: Swelling, Edema, and Pain.  Limitations: Poor ultrasound/tissue interface. Comparison Study: no prior Performing Technologist: Archie Patten RVS  Examination Guidelines: A complete evaluation includes B-mode imaging, spectral Doppler, color Doppler, and power Doppler as needed of all accessible portions of each vessel. Bilateral testing is considered an integral part of a complete examination. Limited examinations for reoccurring indications may be performed as noted. The reflux portion of the exam is performed with the patient in reverse Trendelenburg.  +---------+---------------+---------+-----------+----------+-------------------+ RIGHT    CompressibilityPhasicitySpontaneityPropertiesThrombus Aging      +---------+---------------+---------+-----------+----------+-------------------+ CFV      Full           Yes      Yes                                      +---------+---------------+---------+-----------+----------+-------------------+ SFJ      Full                                                             +---------+---------------+---------+-----------+----------+-------------------+ FV Prox  Full                                                             +---------+---------------+---------+-----------+----------+-------------------+  FV Mid                  Yes      Yes                                      +---------+---------------+---------+-----------+----------+-------------------+ FV Distal               Yes      Yes                                       +---------+---------------+---------+-----------+----------+-------------------+ PFV      Full                                                             +---------+---------------+---------+-----------+----------+-------------------+ POP      Full           Yes      Yes                                      +---------+---------------+---------+-----------+----------+-------------------+ PTV      Full                                                             +---------+---------------+---------+-----------+----------+-------------------+ PERO                                                  Not well visualized +---------+---------------+---------+-----------+----------+-------------------+   +---------+---------------+---------+-----------+----------+-------------------+ LEFT     CompressibilityPhasicitySpontaneityPropertiesThrombus Aging      +---------+---------------+---------+-----------+----------+-------------------+ CFV      Full           Yes      Yes                                      +---------+---------------+---------+-----------+----------+-------------------+ SFJ      Full                                                             +---------+---------------+---------+-----------+----------+-------------------+ FV Prox  Full                                                             +---------+---------------+---------+-----------+----------+-------------------+ FV Mid   Full                                                             +---------+---------------+---------+-----------+----------+-------------------+  FV DistalFull                                                             +---------+---------------+---------+-----------+----------+-------------------+ PFV      Full                                                             +---------+---------------+---------+-----------+----------+-------------------+  POP      Full           Yes      Yes                                      +---------+---------------+---------+-----------+----------+-------------------+ PTV      Full                                                             +---------+---------------+---------+-----------+----------+-------------------+ PERO                                                  Not well visualized +---------+---------------+---------+-----------+----------+-------------------+     Summary: RIGHT: - There is no evidence of deep vein thrombosis in the lower extremity. However, portions of this examination were limited- see technologist comments above.  - No cystic structure found in the popliteal fossa.  LEFT: - There is no evidence of deep vein thrombosis in the lower extremity. However, portions of this examination were limited- see technologist comments above.  - No cystic structure found in the popliteal fossa.  *See table(s) above for measurements and observations. Electronically signed by Monica Martinez MD on 07/12/2020 at 10:25:31 AM.    Final    CT EXTREMITY LOWER RIGHT WO CONTRAST  Final Result    CT Angio Chest PE W and/or Wo Contrast  Final Result    VAS [place] LOWER EXTREMITY VENOUS (DVT) (MC and WL 7a-7p)  Final Result    DG Chest 2 View  Final Result      Scheduled Meds:  allopurinol  300 mg Oral Daily   docusate sodium  100 mg Oral BID   folic acid  1 mg Oral Daily   furosemide  80 mg Intravenous BID   insulin aspart  0-5 Units Subcutaneous QHS   insulin aspart  0-9 Units Subcutaneous TID WC   metoprolol succinate  50 mg Oral Daily   simvastatin  20 mg Oral Daily   sodium chloride flush  3 mL Intravenous Q12H   PRN Meds: sodium chloride, acetaminophen **OR** acetaminophen, HYDROcodone-acetaminophen, sodium chloride flush Continuous Infusions:  sodium chloride       LOS: 2 days  Time spent: Greater than 50% of the 35 minute visit was spent in counseling/coordination of  care for the  patient as laid out in the A&P.   Dwyane Dee, MD [facility] 07/13/2020, 1:17 PM

## 2020-07-13 NOTE — Progress Notes (Signed)
Physical Therapy Treatment Patient Details Name: [name] MRN: [id number] DOB: April 18, 1931 Today's Date: 07/13/2020    History of Present Illness 87 yo male admitted with CHF, bil LE edema, pleural effusion, fall at home recently. Hx of CHF, DM, A fib, CKD, obesity, venous insufficiency, falls.    PT Comments    Pt ambulated in hallway and very unsteady without assistive device.  Pt occasionally using cane and states he feels his unsteadiness is due to not wearing shoes.  Pt strongly encouraged to use RW at home for safety.    Follow Up Recommendations  Home health PT;Supervision/Assistance - 24 hour     Equipment Recommendations  Rolling walker with 5" wheels    Recommendations for Other Services       Precautions / Restrictions Precautions Precautions: Fall Precaution Comments: PICC line RUE, BLE edema    Mobility  Bed Mobility Overal bed mobility: Modified Independent Bed Mobility: Supine to Sit;Sit to Supine                Transfers Overall transfer level: Needs assistance Equipment used: Straight cane;None Transfers: Sit to/from Stand Sit to Stand: Min guard         General transfer comment: min/guard for safety  Ambulation/Gait Ambulation/Gait assistance: Min assist Gait Distance (Feet): 350 Feet Assistive device: Straight cane Gait Pattern/deviations: Step-through pattern;Wide base of support     General Gait Details: unsteady with cane however pt blames slippery socks and floor (wearing hospital grip socks), pt, at times, holding cane off floor; pt encouraged to use RW upon d/c for safety, one instance of LOB requiring min assist to correct   Stairs             Wheelchair Mobility    Modified Rankin (Stroke Patients Only)       Balance                                            Cognition Arousal/Alertness: Awake/alert Behavior During Therapy: WFL for tasks assessed/performed Overall Cognitive Status:  Within Functional Limits for tasks assessed                                        Exercises      General Comments        Pertinent Vitals/Pain Pain Assessment: Faces Faces Pain Scale: Hurts a little bit Pain Location: bil LEs Pain Descriptors / Indicators: Discomfort Pain Intervention(s): Repositioned;Monitored during session    Home Living                      Prior Function            PT Goals (current goals can now be found in the care plan section) Progress towards PT goals: Progressing toward goals    Frequency    Min 3X/week      PT Plan Current plan remains appropriate    Co-evaluation              AM-PAC PT "6 Clicks" Mobility   Outcome Measure  Help needed turning from your back to your side while in a flat bed without using bedrails?: A Little Help needed moving from lying on your back to sitting on the side of a flat bed without using  bedrails?: A Little Help needed moving to and from a bed to a chair (including a wheelchair)?: A Little Help needed standing up from a chair using your arms (Edwards.g., wheelchair or bedside chair)?: A Little Help needed to walk in hospital room?: A Little Help needed climbing 3-5 steps with a railing? : A Little 6 Click Score: 18    End of Session Equipment Utilized During Treatment: Gait belt Activity Tolerance: Patient tolerated treatment well Patient left: in bed;with call bell/phone within reach;with bed alarm set Nurse Communication: Mobility status (nurse tech aware pt requesting to use bathroom) PT Visit Diagnosis: Muscle weakness (generalized) (M62.81);History of falling (Z91.81);Unsteadiness on feet (R26.81)     Time: 2671-2458 PT Time Calculation (min) (ACUTE ONLY): 14 min  Charges:  $Gait Training: 8-22 mins                     [name] PT, DPT Acute Rehabilitation Services Pager: [phone] Office: [phone]    [name] 07/13/2020, 3:45 PM

## 2020-07-14 LAB — BASIC METABOLIC PANEL
Anion gap: 8 (ref 5–15)
BUN: 29 mg/dL — ABNORMAL HIGH (ref 8–23)
CO2: 27 mmol/L (ref 22–32)
Calcium: 8.7 mg/dL — ABNORMAL LOW (ref 8.9–10.3)
Chloride: 102 mmol/L (ref 98–111)
Creatinine, Ser: 1.9 mg/dL — ABNORMAL HIGH (ref 0.61–1.24)
GFR, Estimated: 34 mL/min — ABNORMAL LOW (ref >60)
Glucose, Bld: 96 mg/dL (ref 70–99)
Potassium: 4.1 mmol/L (ref 3.5–5.1)
Sodium: 137 mmol/L (ref 135–145)

## 2020-07-14 LAB — GLUCOSE, CAPILLARY
Glucose-Capillary: 91 mg/dL (ref 70–99)
Glucose-Capillary: 117 mg/dL — ABNORMAL HIGH (ref 70–99)
Glucose-Capillary: 120 mg/dL — ABNORMAL HIGH (ref 70–99)
Glucose-Capillary: 112 mg/dL — ABNORMAL HIGH (ref 70–99)

## 2020-07-14 LAB — CBC WITH DIFFERENTIAL/PLATELET
Abs Immature Granulocytes: 0.03 10*3/uL (ref 0.00–0.07)
Basophils Absolute: 0 10*3/uL (ref 0.0–0.1)
Basophils Relative: 1 %
Eosinophils Absolute: 0.2 10*3/uL (ref 0.0–0.5)
Eosinophils Relative: 4 %
HCT: 31 % — ABNORMAL LOW (ref 39.0–52.0)
Hemoglobin: 9.8 g/dL — ABNORMAL LOW (ref 13.0–17.0)
Immature Granulocytes: 1 %
Lymphocytes Relative: 15 %
Lymphs Abs: 0.8 10*3/uL (ref 0.7–4.0)
MCH: 30.9 pg (ref 26.0–34.0)
MCHC: 31.6 g/dL (ref 30.0–36.0)
MCV: 97.8 fL (ref 80.0–100.0)
Monocytes Absolute: 0.9 10*3/uL (ref 0.1–1.0)
Monocytes Relative: 15 %
Neutro Abs: 3.7 10*3/uL (ref 1.7–7.7)
Neutrophils Relative %: 64 %
Platelets: 229 10*3/uL (ref 150–400)
RBC: 3.17 MIL/uL — ABNORMAL LOW (ref 4.22–5.81)
RDW: 17.6 % — ABNORMAL HIGH (ref 11.5–15.5)
WBC: 5.7 10*3/uL (ref 4.0–10.5)
nRBC: 0 % (ref 0.0–0.2)

## 2020-07-14 LAB — OCCULT BLOOD X 1 CARD TO LAB, STOOL: Fecal Occult Bld: NEGATIVE

## 2020-07-14 LAB — PROTIME-INR
INR: 2.8 — ABNORMAL HIGH (ref 0.8–1.2)
Prothrombin Time: 29.9 seconds — ABNORMAL HIGH (ref 11.4–15.2)

## 2020-07-14 LAB — MAGNESIUM: Magnesium: 2.1 mg/dL (ref 1.7–2.4)

## 2020-07-14 NOTE — Progress Notes (Signed)
PROGRESS NOTE    [name]  OZD:[id number] DOB: January 17, 1931 DOA: 07/11/2020 PCP: Midge Minium, MD   Chief Complaint  Patient presents with   Leg Swelling    Brief Narrative: 87 year old male with A. fib on Coumadin, diastolic CHF, CKD stage IIIb, HTN, T2DM chronic venous insufficiency followed at wound clinic admitted with shortness of breath progressively worsening swelling of lower extremities including scrotum, orthopnea. Patient seen in the ED and CTA chest negative for PE but bilateral pleural effusion left greater than right, also right thigh bruising CT showed hematoma 8 X3 centimeter, lower extremity duplex negative for DVT BNP 420 INR 3.8 hemoglobin 11.9 g. Penicillin antibiotics to cover for pneumonia subsequently on IV Lasix for volume overload, cardio consulted Coumadin held   Subjective: Seen examined Overnight afebrile Had his meal, leg swelling much better he says  Assessment & Plan:  Acute on chronic diastolic CHF exacerbation Bilateral pleural effusion due to #1 LVEF ~ 60% dilated LA with lower extremity edema scrotal edema shortness of breath pleural effusion.  Continue aggressive diuresis Lasix 80 mg iv BID-per cardiolog, leg edema improvingm wt monitor intake output Daily and weight daily as below.  Wt down 233> 227>231 lb ( some up). Continue to fluid and salt restriction.  Pro-Cal is negative antibiotic were discontinued, no pneumonia Intake/Output Summary (Last 24 hours) at 07/14/2020 0953 Last data filed at 07/14/2020 0600 Gross per 24 hour  Intake 289.8 ml  Output 3300 ml  Net -3010.2 ml    Wt Readings from Last 3 Encounters:  07/14/20 104.8 kg  06/18/20 106.2 kg  07/08/19 97.5 kg   AKI on CKD stage IIIb Baseline creatinine 1.4-1.5 Monitor closely while on diuresis may need to allow creatinine slightly on higher side to allow diuresis.?CRS. Recent Labs  Lab 07/11/20 1556 07/12/20 0427 07/13/20 0437 07/14/20 0435  BUN 23 26* 26* 29*   CREATININE 1.74* 1.54* 1.85* 1.90*   Right thigh hematoma, in the setting of supported INR, Coumadin on hold negative for duplex CT lower extremity reviewed.  Monitor neurovascular status closely  A. fib chronic on Coumadin currently on hold due to hematoma may need DOAC on discharge defer to cardiology  Morbid obesity with BMI 32 would benefit with weight loss  Chronic venous insufficiency followed up at wound care  Essential hypertension on Toprol and diuresis  Normocytic anemia monitor  Type 2 diabetes mellitus blood sugar stable continue SSI Recent Labs  Lab 07/13/20 0735 07/13/20 1105 07/13/20 1642 07/13/20 2200 07/14/20 0720  GLUCAP 103* 91 106* 120* 91    Lab Results  Component Value Date   HGBA1C 5.6 07/11/2020    Hyperlipidemia: On Statin    Diet Order             Diet Carb Modified Fluid consistency: Thin; Room service appropriate? Yes  Diet effective now                   Patient's Body mass index is 32.22 kg/m.  DVT prophylaxis: Place and maintain sequential compression device Start: 07/12/20 0139 Code Status:   Code Status: DNR  Family Communication: plan of care discussed with patient at bedside. Self updating his wife. [place] to call. Status is: Inpatient Remains inpatient appropriate because:IV treatments appropriate due to intensity of illness or inability to take PO and Inpatient level of care appropriate due to severity of illness Dispo: The patient is from: Home              Anticipated  d/c is to: Home w/ HH hopefully in 2 days once cleared by cardio              Patient currently is not medically stable to d/c.   Difficult to place patient No Unresulted Labs (From admission, onward)     Start     Ordered   07/13/20 0500  Protime-INR  Daily,   R      07/12/20 1124   07/13/20 5885  Basic metabolic panel  Daily,   R      07/12/20 1516   07/13/20 0500  CBC with Differential/Platelet  Daily,   R      07/12/20 1517   07/13/20 0500   Magnesium  Daily,   R      07/12/20 1517   Unscheduled  Occult blood card to lab, stool RN will collect  As needed,   R     Question:  Specimen to be collected by:  Answer:  RN will collect   07/14/20 0828           Medications reviewed: Scheduled Meds:  allopurinol  300 mg Oral Daily   docusate sodium  100 mg Oral BID   feeding supplement (GLUCERNA SHAKE)  237 mL Oral BID BM   folic acid  1 mg Oral Daily   furosemide  80 mg Intravenous BID   insulin aspart  0-5 Units Subcutaneous QHS   insulin aspart  0-9 Units Subcutaneous TID WC   metoprolol succinate  50 mg Oral Daily   simvastatin  20 mg Oral Daily   sodium chloride flush  3 mL Intravenous Q12H   Continuous Infusions:  sodium chloride     Consultants:see note  Procedures:see note Antimicrobials: Anti-infectives (From admission, onward)    Start     Dose/Rate Route Frequency Ordered Stop   07/12/20 0200  cefTRIAXone (ROCEPHIN) 2 g in sodium chloride 0.9 % 100 mL IVPB  Status:  Discontinued        2 g 200 mL/hr over 30 Minutes Intravenous Every 24 hours 07/12/20 0132 07/12/20 0750   07/12/20 0200  azithromycin (ZITHROMAX) 500 mg in sodium chloride 0.9 % 250 mL IVPB  Status:  Discontinued        500 mg 250 mL/hr over 60 Minutes Intravenous Every 24 hours 07/12/20 0132 07/12/20 0750      Culture/Microbiology No results found for: SDES, SPECREQUEST, CULT, REPTSTATUS  Other culture-see note  Objective: Vitals: Today's Vitals   07/13/20 2235 07/13/20 2324 07/14/20 0500 07/14/20 0641  BP:    (!) 123/55  Pulse:    81  Resp:    20  Temp:    97.8 F (36.6 C)  TempSrc:    Oral  SpO2:    96%  Weight:   104.8 kg   Height:      PainSc: 5  3       Intake/Output Summary (Last 24 hours) at 07/14/2020 0953 Last data filed at 07/14/2020 0600 Gross per 24 hour  Intake 289.8 ml  Output 3300 ml  Net -3010.2 ml   Filed Weights   07/11/20 1544 07/13/20 0758 07/14/20 0500  Weight: 106 kg 103.3 kg 104.8 kg   Weight  change:   Intake/Output from previous day: 07/12 0701 - 07/13 0700 In: 289.8 [P.O.:240; IV Piggyback:49.8] Out: 3300 [Urine:3300] Intake/Output this shift: No intake/output data recorded. Filed Weights   07/11/20 1544 07/13/20 0758 07/14/20 0500  Weight: 106 kg 103.3 kg 104.8 kg   Examination: General exam:  AAO x3, elderly, pleasant HEENT:Oral mucosa moist, Ear/Nose WNL grossly,dentition normal. Respiratory system: bilaterally diminished, mild basal crackles, no use of accessory muscle, non tender. Cardiovascular system: S1 & S2 +,No JVD. Gastrointestinal system: Abdomen soft, NT,ND, BS+. Nervous System:Alert, awake, moving extremities Extremities: LE edema, Rt thigh erythema mildly tender Skin: No rashes,no icterus. MSK: Normal muscle bulk,tone, power Data Reviewed: I have personally reviewed following labs and imaging studies CBC: Recent Labs  Lab 07/11/20 1749 07/12/20 0427 07/13/20 0437 07/14/20 0435  WBC 6.0 5.8 6.4 5.7  NEUTROABS 3.9 4.0 4.3 3.7  HGB 11.9* 11.0* 10.4* 9.8*  HCT 37.1* 33.7* 32.1* 31.0*  MCV 96.1 95.5 94.7 97.8  PLT 305 300 271 364   Basic Metabolic Panel: Recent Labs  Lab 07/11/20 1556 07/12/20 0427 07/13/20 0437 07/14/20 0435  NA 137 140 139 137  K 4.4 3.8 4.0 4.1  CL 103 104 104 102  CO2 29 28 28 27   GLUCOSE 97 106* 99 96  BUN 23 26* 26* 29*  CREATININE 1.74* 1.54* 1.85* 1.90*  CALCIUM 8.6* 8.7* 8.6* 8.7*  MG  --  2.2 1.9 2.1  PHOS  --  2.7  --   --    GFR: Estimated Creatinine Clearance: 33.7 mL/min (A) (by C-G formula based on SCr of 1.9 mg/dL (H)). Liver Function Tests: Recent Labs  Lab 07/11/20 1556 07/12/20 0427  AST 22 23  ALT 11 11  ALKPHOS 93 93  BILITOT 2.4* 2.5*  PROT 6.5 6.5  ALBUMIN 2.9* 3.0*   No results for input(s): LIPASE, AMYLASE in the last 168 hours. No results for input(s): AMMONIA in the last 168 hours. Coagulation Profile: Recent Labs  Lab 07/11/20 1556 07/12/20 0427 07/13/20 0437 07/14/20 0435   INR 3.8* 3.6* 3.2* 2.8*   Cardiac Enzymes: No results for input(s): CKTOTAL, CKMB, CKMBINDEX, TROPONINI in the last 168 hours. BNP (last 3 results) No results for input(s): PROBNP in the last 8760 hours. HbA1C: Recent Labs    07/11/20 1749  HGBA1C 5.6   CBG: Recent Labs  Lab 07/13/20 0735 07/13/20 1105 07/13/20 1642 07/13/20 2200 07/14/20 0720  GLUCAP 103* 91 106* 120* 91   Lipid Profile: No results for input(s): CHOL, HDL, LDLCALC, TRIG, CHOLHDL, LDLDIRECT in the last 72 hours. Thyroid Function Tests: Recent Labs    07/12/20 0427  TSH 2.179   Anemia Panel: Recent Labs    07/12/20 0427  VITAMINB12 1,285*  FOLATE 6.3  FERRITIN 72  TIBC 268  IRON 95  RETICCTPCT 1.9   Sepsis Labs: Recent Labs  Lab 07/12/20 0427 07/13/20 0437  PROCALCITON <0.10 <0.10    Recent Results (from the past 240 hour(s))  Resp Panel by RT-PCR (Flu A&B, Covid) Nasopharyngeal Swab     Status: None   Collection Time: 07/11/20  6:10 PM   Specimen: Nasopharyngeal Swab; Nasopharyngeal(NP) swabs in vial transport medium  Result Value Ref Range Status   SARS Coronavirus 2 by RT PCR NEGATIVE NEGATIVE Final    Comment: (NOTE) SARS-CoV-2 target nucleic acids are NOT DETECTED.  The SARS-CoV-2 RNA is generally detectable in upper respiratory specimens during the acute phase of infection. The lowest concentration of SARS-CoV-2 viral copies this assay can detect is 138 copies/mL. A negative result does not preclude SARS-Cov-2 infection and should not be used as the sole basis for treatment or other patient management decisions. A negative result may occur with  improper specimen collection/handling, submission of specimen other than nasopharyngeal swab, presence of viral mutation(s) within the areas targeted  by this assay, and inadequate number of viral copies(<138 copies/mL). A negative result must be combined with clinical observations, patient history, and epidemiological information.  The expected result is Negative.  Fact Sheet for Patients:  EntrepreneurPulse.com.au  Fact Sheet for Healthcare Providers:  IncredibleEmployment.be  This test is no t yet approved or cleared by the [place] FDA and  has been authorized for detection and/or diagnosis of SARS-CoV-2 by FDA under an Emergency Use Authorization (EUA). This EUA will remain  in effect (meaning this test can be used) for the duration of the COVID-19 declaration under Section 564(b)(1) of the Act, 21 U.S.C.section 360bbb-3(b)(1), unless the authorization is terminated  or revoked sooner.       Influenza A by PCR NEGATIVE NEGATIVE Final   Influenza B by PCR NEGATIVE NEGATIVE Final    Comment: (NOTE) The Xpert Xpress SARS-CoV-2/FLU/RSV plus assay is intended as an aid in the diagnosis of influenza from Nasopharyngeal swab specimens and should not be used as a sole basis for treatment. Nasal washings and aspirates are unacceptable for Xpert Xpress SARS-CoV-2/FLU/RSV testing.  Fact Sheet for Patients: EntrepreneurPulse.com.au  Fact Sheet for Healthcare Providers: IncredibleEmployment.be  This test is not yet approved or cleared by the [place] FDA and has been authorized for detection and/or diagnosis of SARS-CoV-2 by FDA under an Emergency Use Authorization (EUA). This EUA will remain in effect (meaning this test can be used) for the duration of the COVID-19 declaration under Section 564(b)(1) of the Act, 21 U.S.C. section 360bbb-3(b)(1), unless the authorization is terminated or revoked.  Performed at [facility], [place] [address], [place], [place] [address]      Radiology Studies: ECHOCARDIOGRAM COMPLETE  Result Date: 07/12/2020    ECHOCARDIOGRAM REPORT   Patient Name:   TAVI HOOGENDOORN Date of Exam: 07/12/2020 Medical Rec #:  [id number]      Height:       71.0 in Accession #:    [id number]      Weight:       233.7 lb Date of Birth:  Jan 11, 1931      BSA:          2.253 m Patient Age:    69 years       BP:           133/56 mmHg Patient Gender: M              HR:           75 bpm. Exam Location:  Inpatient Procedure: 2D Echo, Cardiac Doppler and Color Doppler Indications:    Dyspnea R06.00  History:        Patient has prior history of Echocardiogram examinations, most                 recent 11/12/2018. Arrythmias:Atrial Fibrillation; Risk                 Factors:Diabetes, Dyslipidemia and Hypertension.  Sonographer:    Bernadene Person RDCS Referring Phys: [place]  1. Left ventricular ejection fraction, by estimation, is 60 to 65%. The left ventricle has normal function. The left ventricle has no regional wall motion abnormalities. Diastolic function indeterminant due to Afib.  2. Right ventricular systolic function is normal. The right ventricular size is normal. There is moderately elevated pulmonary artery systolic pressure. The estimated right ventricular systolic pressure is 85.2 mmHg.  3. Left atrial size was severely dilated.  4. Right atrial size was mildly dilated.  5.  Moderate pleural effusion in the left lateral region.  6. The mitral valve is grossly normal. Mild mitral valve regurgitation.  7. The aortic valve is tricuspid. There is mild calcification of the aortic valve. There is moderate thickening of the aortic valve. Aortic valve regurgitation is mild to moderate. Mild to moderate aortic valve sclerosis/calcification is present, without any evidence of aortic stenosis.  8. Aortic dilatation noted. There is borderline dilatation of the aortic root, measuring 37 mm. There is moderate dilatation of the ascending aorta, measuring 40 mm.  9. The inferior vena cava is dilated in size with <50% respiratory variability, suggesting right atrial pressure of 15 mmHg. Comparison(s): Compared to prior TTE in 11/2018, a left sided moderate pleural effusion is now present. FINDINGS   Left Ventricle: Left ventricular ejection fraction, by estimation, is 60 to 65%. The left ventricle has normal function. The left ventricle has no regional wall motion abnormalities. The left ventricular internal cavity size was normal in size. There is  no left ventricular hypertrophy. Diastolic function indeterminant due to Afib. Right Ventricle: The right ventricular size is normal. No increase in right ventricular wall thickness. Right ventricular systolic function is normal. There is moderately elevated pulmonary artery systolic pressure. The tricuspid regurgitant velocity is 2.92 m/s, and with an assumed right atrial pressure of 15 mmHg, the estimated right ventricular systolic pressure is 47.0 mmHg. Left Atrium: Left atrial size was severely dilated. Right Atrium: Right atrial size was mildly dilated. Pericardium: Trivial pericardial effusion is present. Mitral Valve: The mitral valve is grossly normal. There is mild thickening of the mitral valve leaflet(s). There is mild calcification of the mitral valve leaflet(s). Mild to moderate mitral annular calcification. Mild mitral valve regurgitation. Tricuspid Valve: The tricuspid valve is normal in structure. Tricuspid valve regurgitation is mild. Aortic Valve: The aortic valve is tricuspid. There is mild calcification of the aortic valve. There is moderate thickening of the aortic valve. Aortic valve regurgitation is mild to moderate. Aortic regurgitation PHT measures 351 msec. Mild to moderate aortic valve sclerosis/calcification is present, without any evidence of aortic stenosis. Aortic valve mean gradient measures 11.7 mmHg. Aortic valve peak gradient measures 19.9 mmHg. Aortic valve area, by VTI measures 2.49 cm. Pulmonic Valve: The pulmonic valve was normal in structure. Pulmonic valve regurgitation is trivial. Aorta: Aortic dilatation noted. There is borderline dilatation of the aortic root, measuring 37 mm. There is moderate dilatation of the  ascending aorta, measuring 40 mm. Venous: The inferior vena cava is dilated in size with less than 50% respiratory variability, suggesting right atrial pressure of 15 mmHg. IAS/Shunts: No atrial level shunt detected by color flow Doppler. Additional Comments: There is a moderate pleural effusion in the left lateral region.  LEFT VENTRICLE PLAX 2D LVIDd:         4.60 cm LVIDs:         2.90 cm LV PW:         2.50 cm LV IVS:        1.00 cm LVOT diam:     2.10 cm LV SV:         120 LV SV Index:   53 LVOT Area:     3.46 cm  RIGHT VENTRICLE RV S prime:     12.30 cm/s TAPSE (M-mode): 2.1 cm LEFT ATRIUM              Index       RIGHT ATRIUM           Index  LA diam:        6.20 cm  2.75 cm/m  RA Area:     26.40 cm LA Vol (A2C):   175.0 ml 77.68 ml/m RA Volume:   79.10 ml  35.11 ml/m LA Vol (A4C):   171.0 ml 75.91 ml/m LA Biplane Vol: 178.0 ml 79.01 ml/m  AORTIC VALVE AV Area (Vmax):    2.30 cm AV Area (Vmean):   2.20 cm AV Area (VTI):     2.49 cm AV Vmax:           223.00 cm/s AV Vmean:          158.667 cm/s AV VTI:            0.483 m AV Peak Grad:      19.9 mmHg AV Mean Grad:      11.7 mmHg LVOT Vmax:         148.00 cm/s LVOT Vmean:        101.000 cm/s LVOT VTI:          0.347 m LVOT/AV VTI ratio: 0.72 AI PHT:            351 msec  AORTA Ao Root diam: 3.70 cm TRICUSPID VALVE TR Peak grad:   34.1 mmHg TR Vmax:        292.00 cm/s  SHUNTS Systemic VTI:  0.35 m Systemic Diam: 2.10 cm Gwyndolyn Kaufman MD Electronically signed by Gwyndolyn Kaufman MD Signature Date/Time: 07/12/2020/4:03:37 PM    Final      LOS: 3 days   Antonieta Pert, MD [facility]  07/14/2020, 9:53 AM

## 2020-07-14 NOTE — TOC Benefit Eligibility Note (Signed)
Transition of Care [facility]) Benefit Eligibility Note    Patient Details  Name: [name] MRN: [id number] Date of Birth: 08-27-1931   Medication/Dose: Xarelto 20 mg po x 30days and Eliquis 5 mg po x 30 days  Covered?: Yes  Tier: 3 Drug  Prescription Coverage Preferred Pharmacy: local pharmacy  Spoke with Person/Company/Phone Number:: Mia [phone]  Co-Pay: $45.00  Prior Approval: No  Deductible: Met       Kerin Salen Phone Number: 07/14/2020, 8:49 AM

## 2020-07-14 NOTE — Plan of Care (Signed)
  Problem: Clinical Measurements: Goal: Ability to maintain clinical measurements within normal limits will improve Outcome: Progressing   Problem: Activity: Goal: Risk for activity intolerance will decrease Outcome: Progressing   

## 2020-07-14 NOTE — Progress Notes (Signed)
Occupational Therapy Treatment Patient Details Name: [name] MRN: [id number] DOB: 01-13-1931 Today's Date: 07/14/2020    History of present illness 87 yo male admitted with CHF, bil LE edema, pleural effusion, fall at home recently. Hx of CHF, DM, A fib, CKD, obesity, venous insufficiency, falls.   OT comments  Treatment focused on particpation in ADL. Patient able to ambulate to bathroom holding on to furniture and wall - at his request. Able to perform toileting, partial lower body bathing at sink and standing grooming tasks with only supervision from therapist. Patient did not want therapist to assist or be in bathroom. Patient exhibited functional balance with ADL tasks without overt loss of balance. Decreased OT Poc to 1 x a week as patient doing well.   Follow Up Recommendations  No OT follow up    Equipment Recommendations  Tub/shower seat    Recommendations for Other Services      Precautions / Restrictions Precautions Precautions: Fall Precaution Comments: PICC line RUE, BLE edema Restrictions Weight Bearing Restrictions: No       Mobility Bed Mobility Overal bed mobility: Modified Independent                  Transfers Overall transfer level: Needs assistance Equipment used: None Transfers: Sit to/from Stand           General transfer comment: Patient reported he did not need to use a device. Held onto furinture and wall to walk into bathroom and get around in room. Able to stand and perform ADLs without overt loss of balance - including bending over to bath bathing and toileting tasks.    Balance Overall balance assessment: Mild deficits observed, not formally tested                                         ADL either performed or assessed with clinical judgement   ADL       Grooming: Independent Grooming Details (indicate cue type and reason): independent at sink to perform oral care and wash hands.     Lower Body  Bathing: Supervison/ safety Lower Body Bathing Details (indicate cue type and reason): performed LB bathing standing at sink with supervision only         Toilet Transfer: Supervision/safety   Toileting- Clothing Manipulation and Hygiene: Supervision/safety               Vision Patient Visual Report: No change from baseline     Perception     Praxis      Cognition Arousal/Alertness: Awake/alert Behavior During Therapy: WFL for tasks assessed/performed Overall Cognitive Status: Within Functional Limits for tasks assessed                                          Exercises     Shoulder Instructions       General Comments      Pertinent Vitals/ Pain       Pain Assessment: No/denies pain  Home Living                                          Prior Functioning/Environment  Frequency  Min 1X/week        Progress Toward Goals  OT Goals(current goals can now be found in the care plan section)  Progress towards OT goals: Progressing toward goals  Acute Rehab OT Goals Patient Stated Goal: get better OT Goal Formulation: With patient Time For Goal Achievement: 07/26/20 Potential to Achieve Goals: Good  Plan Discharge plan remains appropriate    Co-evaluation                 AM-PAC OT "6 Clicks" Daily Activity     Outcome Measure   Help from another person eating meals?: None Help from another person taking care of personal grooming?: None Help from another person toileting, which includes using toliet, bedpan, or urinal?: A Little Help from another person bathing (including washing, rinsing, drying)?: A Little Help from another person to put on and taking off regular upper body clothing?: A Little Help from another person to put on and taking off regular lower body clothing?: A Little 6 Click Score: 20    End of Session    OT Visit Diagnosis: Unsteadiness on feet (R26.81);Muscle weakness  (generalized) (M62.81)   Activity Tolerance Patient tolerated treatment well   Patient Left in chair;with call bell/phone within reach;with chair alarm set   Nurse Communication  (okay to see)        Time: 5320-2334 OT Time Calculation (min): 12 min  Charges: OT General Charges $OT Visit: 1 Visit OT Treatments $Self Care/Home Management : 8-22 mins  Derl Barrow, OTR/L [place]  Office [phone] Pager: [place] 07/14/2020, 5:18 PM

## 2020-07-14 NOTE — Progress Notes (Addendum)
Progress Note  Patient Name: [name] Date of Encounter: 07/14/2020  Primary Cardiologist: Sinclair Grooms, MD  Subjective   Patient feels much better, swelling continues to improve. No CP or SOB.  Inpatient Medications    Scheduled Meds:  allopurinol  300 mg Oral Daily   docusate sodium  100 mg Oral BID   feeding supplement (GLUCERNA SHAKE)  237 mL Oral BID BM   folic acid  1 mg Oral Daily   furosemide  80 mg Intravenous BID   insulin aspart  0-5 Units Subcutaneous QHS   insulin aspart  0-9 Units Subcutaneous TID WC   metoprolol succinate  50 mg Oral Daily   simvastatin  20 mg Oral Daily   sodium chloride flush  3 mL Intravenous Q12H   Continuous Infusions:  sodium chloride     PRN Meds: sodium chloride, acetaminophen **OR** acetaminophen, HYDROcodone-acetaminophen, sodium chloride flush   Vital Signs    Vitals:   07/13/20 1500 07/13/20 2204 07/14/20 0500 07/14/20 0641  BP:  (!) 137/54  (!) 123/55  Pulse:  78  81  Resp: 19 18  20   Temp:  97.9 F (36.6 C)  97.8 F (36.6 C)  TempSrc:  Oral  Oral  SpO2:  97%  96%  Weight:   104.8 kg   Height:        Intake/Output Summary (Last 24 hours) at 07/14/2020 0758 Last data filed at 07/14/2020 0600 Gross per 24 hour  Intake 289.8 ml  Output 3300 ml  Net -3010.2 ml   Last 3 Weights 07/14/2020 07/13/2020 07/11/2020  Weight (lbs) 231 lb 0.7 oz 227 lb 11.8 oz 233 lb 11 oz  Weight (kg) 104.8 kg 103.3 kg 106 kg     Telemetry    Per MD as below - Personally Reviewed  Physical Exam   Per MD  Labs    High Sensitivity Troponin:   Recent Labs  Lab 07/11/20 1556 07/11/20 1749  TROPONINIHS 11 11      Cardiac EnzymesNo results for input(s): TROPONINI in the last 168 hours. No results for input(s): TROPIPOC in the last 168 hours.   Chemistry Recent Labs  Lab 07/11/20 1556 07/12/20 0427 07/13/20 0437 07/14/20 0435  NA 137 140 139 137  K 4.4 3.8 4.0 4.1  CL 103 104 104 102  CO2 29 28 28 27   GLUCOSE  97 106* 99 96  BUN 23 26* 26* 29*  CREATININE 1.74* 1.54* 1.85* 1.90*  CALCIUM 8.6* 8.7* 8.6* 8.7*  PROT 6.5 6.5  --   --   ALBUMIN 2.9* 3.0*  --   --   AST 22 23  --   --   ALT 11 11  --   --   ALKPHOS 93 93  --   --   BILITOT 2.4* 2.5*  --   --   GFRNONAA 37* 43* 35* 34*  ANIONGAP 5 8 7 8      Hematology Recent Labs  Lab 07/12/20 0427 07/13/20 0437 07/14/20 0435  WBC 5.8 6.4 5.7  RBC 3.53*  3.59* 3.39* 3.17*  HGB 11.0* 10.4* 9.8*  HCT 33.7* 32.1* 31.0*  MCV 95.5 94.7 97.8  MCH 31.2 30.7 30.9  MCHC 32.6 32.4 31.6  RDW 17.7* 17.4* 17.6*  PLT 300 271 229    BNP Recent Labs  Lab 07/11/20 1556  BNP 420.8*     DDimer No results for input(s): DDIMER in the last 168 hours.   Radiology  ECHOCARDIOGRAM COMPLETE  Result Date: 07/12/2020    ECHOCARDIOGRAM REPORT   Patient Name:   [name] Date of Exam: 07/12/2020 Medical Rec #:  [id number]      Height:       71.0 in Accession #:    [id number]     Weight:       233.7 lb Date of Birth:  Apr 09, 1931      BSA:          2.253 m Patient Age:    5 years       BP:           133/56 mmHg Patient Gender: M              HR:           75 bpm. Exam Location:  Inpatient Procedure: 2D Echo, Cardiac Doppler and Color Doppler Indications:    Dyspnea R06.00  History:        Patient has prior history of Echocardiogram examinations, most                 recent 11/12/2018. Arrythmias:Atrial Fibrillation; Risk                 Factors:Diabetes, Dyslipidemia and Hypertension.  Sonographer:    Bernadene Person RDCS Referring Phys: Short  1. Left ventricular ejection fraction, by estimation, is 60 to 65%. The left ventricle has normal function. The left ventricle has no regional wall motion abnormalities. Diastolic function indeterminant due to Afib.  2. Right ventricular systolic function is normal. The right ventricular size is normal. There is moderately elevated pulmonary artery systolic pressure. The estimated right  ventricular systolic pressure is 78.5 mmHg.  3. Left atrial size was severely dilated.  4. Right atrial size was mildly dilated.  5. Moderate pleural effusion in the left lateral region.  6. The mitral valve is grossly normal. Mild mitral valve regurgitation.  7. The aortic valve is tricuspid. There is mild calcification of the aortic valve. There is moderate thickening of the aortic valve. Aortic valve regurgitation is mild to moderate. Mild to moderate aortic valve sclerosis/calcification is present, without any evidence of aortic stenosis.  8. Aortic dilatation noted. There is borderline dilatation of the aortic root, measuring 37 mm. There is moderate dilatation of the ascending aorta, measuring 40 mm.  9. The inferior vena cava is dilated in size with <50% respiratory variability, suggesting right atrial pressure of 15 mmHg. Comparison(s): Compared to prior TTE in 11/2018, a left sided moderate pleural effusion is now present. FINDINGS  Left Ventricle: Left ventricular ejection fraction, by estimation, is 60 to 65%. The left ventricle has normal function. The left ventricle has no regional wall motion abnormalities. The left ventricular internal cavity size was normal in size. There is  no left ventricular hypertrophy. Diastolic function indeterminant due to Afib. Right Ventricle: The right ventricular size is normal. No increase in right ventricular wall thickness. Right ventricular systolic function is normal. There is moderately elevated pulmonary artery systolic pressure. The tricuspid regurgitant velocity is 2.92 m/s, and with an assumed right atrial pressure of 15 mmHg, the estimated right ventricular systolic pressure is 88.5 mmHg. Left Atrium: Left atrial size was severely dilated. Right Atrium: Right atrial size was mildly dilated. Pericardium: Trivial pericardial effusion is present. Mitral Valve: The mitral valve is grossly normal. There is mild thickening of the mitral valve leaflet(s). There is  mild calcification of the mitral valve leaflet(s). Mild to moderate mitral  annular calcification. Mild mitral valve regurgitation. Tricuspid Valve: The tricuspid valve is normal in structure. Tricuspid valve regurgitation is mild. Aortic Valve: The aortic valve is tricuspid. There is mild calcification of the aortic valve. There is moderate thickening of the aortic valve. Aortic valve regurgitation is mild to moderate. Aortic regurgitation PHT measures 351 msec. Mild to moderate aortic valve sclerosis/calcification is present, without any evidence of aortic stenosis. Aortic valve mean gradient measures 11.7 mmHg. Aortic valve peak gradient measures 19.9 mmHg. Aortic valve area, by VTI measures 2.49 cm. Pulmonic Valve: The pulmonic valve was normal in structure. Pulmonic valve regurgitation is trivial. Aorta: Aortic dilatation noted. There is borderline dilatation of the aortic root, measuring 37 mm. There is moderate dilatation of the ascending aorta, measuring 40 mm. Venous: The inferior vena cava is dilated in size with less than 50% respiratory variability, suggesting right atrial pressure of 15 mmHg. IAS/Shunts: No atrial level shunt detected by color flow Doppler. Additional Comments: There is a moderate pleural effusion in the left lateral region.  LEFT VENTRICLE PLAX 2D LVIDd:         4.60 cm LVIDs:         2.90 cm LV PW:         2.50 cm LV IVS:        1.00 cm LVOT diam:     2.10 cm LV SV:         120 LV SV Index:   53 LVOT Area:     3.46 cm  RIGHT VENTRICLE RV S prime:     12.30 cm/s TAPSE (M-mode): 2.1 cm LEFT ATRIUM              Index       RIGHT ATRIUM           Index LA diam:        6.20 cm  2.75 cm/m  RA Area:     26.40 cm LA Vol (A2C):   175.0 ml 77.68 ml/m RA Volume:   79.10 ml  35.11 ml/m LA Vol (A4C):   171.0 ml 75.91 ml/m LA Biplane Vol: 178.0 ml 79.01 ml/m  AORTIC VALVE AV Area (Vmax):    2.30 cm AV Area (Vmean):   2.20 cm AV Area (VTI):     2.49 cm AV Vmax:           223.00 cm/s AV  Vmean:          158.667 cm/s AV VTI:            0.483 m AV Peak Grad:      19.9 mmHg AV Mean Grad:      11.7 mmHg LVOT Vmax:         148.00 cm/s LVOT Vmean:        101.000 cm/s LVOT VTI:          0.347 m LVOT/AV VTI ratio: 0.72 AI PHT:            351 msec  AORTA Ao Root diam: 3.70 cm TRICUSPID VALVE TR Peak grad:   34.1 mmHg TR Vmax:        292.00 cm/s  SHUNTS Systemic VTI:  0.35 m Systemic Diam: 2.10 cm Gwyndolyn Kaufman MD Electronically signed by Gwyndolyn Kaufman MD Signature Date/Time: 07/12/2020/4:03:37 PM    Final     Cardiac Studies   2D Echo 07/12/20  1. Left ventricular ejection fraction, by estimation, is 60 to 65%. The  left ventricle has normal function. The left  ventricle has no regional  wall motion abnormalities. Diastolic function indeterminant due to Afib.   2. Right ventricular systolic function is normal. The right ventricular  size is normal. There is moderately elevated pulmonary artery systolic  pressure. The estimated right ventricular systolic pressure is 61.9 mmHg.   3. Left atrial size was severely dilated.   4. Right atrial size was mildly dilated.   5. Moderate pleural effusion in the left lateral region.   6. The mitral valve is grossly normal. Mild mitral valve regurgitation.   7. The aortic valve is tricuspid. There is mild calcification of the  aortic valve. There is moderate thickening of the aortic valve. Aortic  valve regurgitation is mild to moderate. Mild to moderate aortic valve  sclerosis/calcification is present,  without any evidence of aortic stenosis.   8. Aortic dilatation noted. There is borderline dilatation of the aortic  root, measuring 37 mm. There is moderate dilatation of the ascending  aorta, measuring 40 mm.   9. The inferior vena cava is dilated in size with <50% respiratory  variability, suggesting right atrial pressure of 15 mmHg.   Comparison(s): Compared to prior TTE in 11/2018, a left sided moderate  pleural effusion is now  present.  Patient Profile     87 y.o. male with chronic diastolic CHF, chronic atrial fibrillation, moderate AI by echo 11/2018, prior liver abnormalities on imaging, anemia, CKD stage III (borderline a-b, baseline Cr appears 1.5-1.9), varicose veins/chronic venous insufficiency followed by wound clinic, DM, HTN, HLD, GIB/stercoral ulcer of rectum 2018, vitamin B 12 deficiency who presented to the hospital with several week history of increasing SOB, marked LEE and scrotal edema. (Of note: pt recalls someone pointing to his leg and saying he had a blood clot a few weeks ago, but no imaging at that time and LE venous duplex negative for DVT here.) He did sustain a fall several weeks ago as well and imaging this admission shows a thigh hematoma.  Assessment & Plan    1. Lower extremity edema, scrotal edema, and pleural effusions suspected due to acute on chronic diastolic CHF - suspect testicular swelling was more of a sequelae of anasarca rather than intrinsic urologic process - 2D echo EF 60-65%, no RWMA, moderate pulmonary HTN, severe LAE, mild RAE, mild MR, mild-moderate AI, borderline dilation of aortic root and moderate dilation of ascending aorta - not clear that I/Os and daily weights are accurate this admission - weight up ~3lb from yesterday but yesterday had excellent UOP of -3.3L (prior to yesterday, I/O's were not well tracked) - pending review of diuretic plan with MD; continue Lasix this AM - consider GI eval as well given prior question of cirrhosis, elevated bilirubin and hypoalbuminemia - albumin level may be driving some of his fluid shifts   2. Chronic atrial fibrillation - managed with rate control strategy, remains rate controlled on Toprol 50mg  daily - was on warfarin prior to admission, last INR check 04/2020, intermittently supratherapeutic - warfarin currently on hold due to thigh hematoma, await medical clearance on when this can be resumed (INR 2.8 today) - consideration  should be given to changing to DOAC this admission - was previously cost prohibitive 5 years ago so care management benefits check is pending to assess cost options, message sent to CM to please update chart with cost result when available   3. Aortic insufficiency by echo - 2D echo this admission shows mild-moderate AI and aortic sclerosis without stenosis - continue to follow as  OP   4. CKD stage IIIb, baseline Cr appears 1.5-1.9 range - admit value 1.74 then 1.57 -> 1.85 -> 1.9  - slight rise noted since nadir value, but still reasonably within prior baseline so will not make any acute changes acutely today - hold ACEi for diuresis in context of CKD   5. Possible CAP - question of consolidation on CXR/CT, will defer abx to primary team   6. Recent fall with thigh hematoma - agree with PT/OT, currently recommended for HHPT and 24-hour supervision - ongoing outpatient f/u will be needed to assess ongoing fall risk - anticoag on hold as above - per CT, recommend f/u scan in several months to ensure resolution and exclude mass   7. Normocytic anemia - prior baseline in the 10-11 range, today 9.8, need to continue to follow, question due to thigh hematoma - no bleeding reported by patient - order screening FOBT - further management per primary team    8. Aortic dilatation - borderline dilatation of the aortic root, measuring 37 mm, and moderate dilatation of ascending aorta measuring 98mm - OP monitoring if clinically appropriate at that time  Telemetry review and physical exam per MD.  For questions or updates, please contact [facility] Please consult [email or web address] for contact info under Cardiology/STEMI.  Signed, Charlie Pitter, PA-C (note prepped remotely) 07/14/2020, 7:58 AM

## 2020-07-14 NOTE — Care Management Important Message (Signed)
Important Message  Patient Details IM Letter given to the Patient. Name: [name] MRN: [id number] Date of Birth: 02/14/1931   Medicare Important Message Given:  Yes     Kerin Salen 07/14/2020, 11:57 AM

## 2020-07-15 ENCOUNTER — Telehealth: Payer: Self-pay | Admitting: Pharmacist

## 2020-07-15 LAB — CBC WITH DIFFERENTIAL/PLATELET
Abs Immature Granulocytes: 0.04 10*3/uL (ref 0.00–0.07)
Basophils Absolute: 0 10*3/uL (ref 0.0–0.1)
Basophils Relative: 1 %
Eosinophils Absolute: 0.2 10*3/uL (ref 0.0–0.5)
Eosinophils Relative: 3 %
HCT: 29.4 % — ABNORMAL LOW (ref 39.0–52.0)
Hemoglobin: 9.3 g/dL — ABNORMAL LOW (ref 13.0–17.0)
Immature Granulocytes: 1 %
Lymphocytes Relative: 17 %
Lymphs Abs: 1.1 10*3/uL (ref 0.7–4.0)
MCH: 30.6 pg (ref 26.0–34.0)
MCHC: 31.6 g/dL (ref 30.0–36.0)
MCV: 96.7 fL (ref 80.0–100.0)
Monocytes Absolute: 0.8 10*3/uL (ref 0.1–1.0)
Monocytes Relative: 13 %
Neutro Abs: 4.1 10*3/uL (ref 1.7–7.7)
Neutrophils Relative %: 65 %
Platelets: 245 10*3/uL (ref 150–400)
RBC: 3.04 MIL/uL — ABNORMAL LOW (ref 4.22–5.81)
RDW: 17.5 % — ABNORMAL HIGH (ref 11.5–15.5)
WBC: 6.2 10*3/uL (ref 4.0–10.5)
nRBC: 0 % (ref 0.0–0.2)

## 2020-07-15 LAB — PROTIME-INR
INR: 2.2 — ABNORMAL HIGH (ref 0.8–1.2)
Prothrombin Time: 24.4 seconds — ABNORMAL HIGH (ref 11.4–15.2)

## 2020-07-15 LAB — GLUCOSE, CAPILLARY
Glucose-Capillary: 92 mg/dL (ref 70–99)
Glucose-Capillary: 94 mg/dL (ref 70–99)
Glucose-Capillary: 106 mg/dL — ABNORMAL HIGH (ref 70–99)
Glucose-Capillary: 114 mg/dL — ABNORMAL HIGH (ref 70–99)

## 2020-07-15 LAB — BASIC METABOLIC PANEL
Anion gap: 7 (ref 5–15)
BUN: 33 mg/dL — ABNORMAL HIGH (ref 8–23)
CO2: 29 mmol/L (ref 22–32)
Calcium: 8.7 mg/dL — ABNORMAL LOW (ref 8.9–10.3)
Chloride: 101 mmol/L (ref 98–111)
Creatinine, Ser: 1.91 mg/dL — ABNORMAL HIGH (ref 0.61–1.24)
GFR, Estimated: 34 mL/min — ABNORMAL LOW (ref >60)
Glucose, Bld: 91 mg/dL (ref 70–99)
Potassium: 3.9 mmol/L (ref 3.5–5.1)
Sodium: 137 mmol/L (ref 135–145)

## 2020-07-15 LAB — MAGNESIUM: Magnesium: 2 mg/dL (ref 1.7–2.4)

## 2020-07-15 NOTE — Progress Notes (Signed)
Progress Note  Patient Name: [name] Date of Encounter: 07/15/2020  Primary Cardiologist: Sinclair Grooms, MD  Subjective   Diuresed another 1.3L negative overnight- now 5L negative. Edema much improved. Creatinine stable around 1.9, BUN up slightly.    Inpatient Medications    Scheduled Meds:  allopurinol  300 mg Oral Daily   docusate sodium  100 mg Oral BID   feeding supplement (GLUCERNA SHAKE)  237 mL Oral BID BM   folic acid  1 mg Oral Daily   furosemide  80 mg Intravenous BID   insulin aspart  0-5 Units Subcutaneous QHS   insulin aspart  0-9 Units Subcutaneous TID WC   metoprolol succinate  50 mg Oral Daily   simvastatin  20 mg Oral Daily   sodium chloride flush  3 mL Intravenous Q12H   Continuous Infusions:  sodium chloride     PRN Meds: sodium chloride, acetaminophen **OR** acetaminophen, HYDROcodone-acetaminophen, sodium chloride flush   Vital Signs    Vitals:   07/14/20 0641 07/14/20 1240 07/14/20 2011 07/15/20 0501  BP: (!) 123/55 121/61 (!) 130/55 (!) 119/55  Pulse: 81 74 72 67  Resp: 20 20 18 18   Temp: 97.8 F (36.6 C) 97.7 F (36.5 C) 97.9 F (36.6 C) 97.7 F (36.5 C)  TempSrc: Oral  Oral Oral  SpO2: 96% 100% 97% 96%  Weight:      Height:        Intake/Output Summary (Last 24 hours) at 07/15/2020 1031 Last data filed at 07/15/2020 1021 Gross per 24 hour  Intake 840 ml  Output 1950 ml  Net -1110 ml   Last 3 Weights 07/14/2020 07/13/2020 07/11/2020  Weight (lbs) 231 lb 0.7 oz 227 lb 11.8 oz 233 lb 11 oz  Weight (kg) 104.8 kg 103.3 kg 106 kg     Telemetry    Afib in the 70's-  Personally Reviewed  Physical Exam   General appearance: alert and no distress Neck: no carotid bruit, no JVD, and thyroid not enlarged, symmetric, no tenderness/mass/nodules Lungs: clear to auscultation bilaterally Heart: irregularly irregular rhythm Abdomen: soft, non-tender; bowel sounds normal; no masses,  no organomegaly Extremities: edema  1-2+ Pulses: 2+ and symmetric Skin: Skin color, texture, turgor normal. No rashes or lesions Neurologic: Grossly normal Psych: Pleasant   Labs    High Sensitivity Troponin:   Recent Labs  Lab 07/11/20 1556 07/11/20 1749  TROPONINIHS 11 11      Cardiac EnzymesNo results for input(s): TROPONINI in the last 168 hours. No results for input(s): TROPIPOC in the last 168 hours.   Chemistry Recent Labs  Lab 07/11/20 1556 07/12/20 0427 07/13/20 0437 07/14/20 0435 07/15/20 0437  NA 137 140 139 137 137  K 4.4 3.8 4.0 4.1 3.9  CL 103 104 104 102 101  CO2 29 28 28 27 29   GLUCOSE 97 106* 99 96 91  BUN 23 26* 26* 29* 33*  CREATININE 1.74* 1.54* 1.85* 1.90* 1.91*  CALCIUM 8.6* 8.7* 8.6* 8.7* 8.7*  PROT 6.5 6.5  --   --   --   ALBUMIN 2.9* 3.0*  --   --   --   AST 22 23  --   --   --   ALT 11 11  --   --   --   ALKPHOS 93 93  --   --   --   BILITOT 2.4* 2.5*  --   --   --   GFRNONAA 37* 43* 35* 34*  34*  ANIONGAP 5 8 7 8 7      Hematology Recent Labs  Lab 07/13/20 0437 07/14/20 0435 07/15/20 0437  WBC 6.4 5.7 6.2  RBC 3.39* 3.17* 3.04*  HGB 10.4* 9.8* 9.3*  HCT 32.1* 31.0* 29.4*  MCV 94.7 97.8 96.7  MCH 30.7 30.9 30.6  MCHC 32.4 31.6 31.6  RDW 17.4* 17.6* 17.5*  PLT 271 229 245    BNP Recent Labs  Lab 07/11/20 1556  BNP 420.8*     DDimer No results for input(s): DDIMER in the last 168 hours.   Radiology    No results found.  Cardiac Studies   2D Echo 07/12/20  1. Left ventricular ejection fraction, by estimation, is 60 to 65%. The  left ventricle has normal function. The left ventricle has no regional  wall motion abnormalities. Diastolic function indeterminant due to Afib.   2. Right ventricular systolic function is normal. The right ventricular  size is normal. There is moderately elevated pulmonary artery systolic  pressure. The estimated right ventricular systolic pressure is 40.0 mmHg.   3. Left atrial size was severely dilated.   4. Right atrial  size was mildly dilated.   5. Moderate pleural effusion in the left lateral region.   6. The mitral valve is grossly normal. Mild mitral valve regurgitation.   7. The aortic valve is tricuspid. There is mild calcification of the  aortic valve. There is moderate thickening of the aortic valve. Aortic  valve regurgitation is mild to moderate. Mild to moderate aortic valve  sclerosis/calcification is present,  without any evidence of aortic stenosis.   8. Aortic dilatation noted. There is borderline dilatation of the aortic  root, measuring 37 mm. There is moderate dilatation of the ascending  aorta, measuring 40 mm.   9. The inferior vena cava is dilated in size with <50% respiratory  variability, suggesting right atrial pressure of 15 mmHg.   Comparison(s): Compared to prior TTE in 11/2018, a left sided moderate  pleural effusion is now present.  Patient Profile     87 y.o. male with chronic diastolic CHF, chronic atrial fibrillation, moderate AI by echo 11/2018, prior liver abnormalities on imaging, anemia, CKD stage III (borderline a-b, baseline Cr appears 1.5-1.9), varicose veins/chronic venous insufficiency followed by wound clinic, DM, HTN, HLD, GIB/stercoral ulcer of rectum 2018, vitamin B 12 deficiency who presented to the hospital with several week history of increasing SOB, marked LEE and scrotal edema. (Of note: pt recalls someone pointing to his leg and saying he had a blood clot a few weeks ago, but no imaging at that time and LE venous duplex negative for DVT here.) He did sustain a fall several weeks ago as well and imaging this admission shows a thigh hematoma.  Assessment & Plan    1. Lower extremity edema, scrotal edema, and pleural effusions suspected due to acute on chronic diastolic CHF - suspect testicular swelling was more of a sequelae of anasarca rather than intrinsic urologic process - 2D echo EF 60-65%, no RWMA, moderate pulmonary HTN, severe LAE, mild RAE, mild MR,  mild-moderate AI, borderline dilation of aortic root and moderate dilation of ascending aorta - not clear that I/Os and daily weights are accurate this admission - weight up ~3lb from yesterday but yesterday had excellent UOP of -3.3L (prior to yesterday, I/O's were not well tracked) - continue IV lasix - creatinine stable, BUN up - may be close to transition to po lasix   2. Chronic atrial fibrillation -  managed with rate control strategy, remains rate controlled on Toprol 50mg  daily - was on warfarin prior to admission, last INR check 04/2020, intermittently supratherapeutic - warfarin currently on hold due to thigh hematoma, await medical clearance on when this can be resumed (INR 2.2 today) - consideration should be given to changing to DOAC this admission - was previously cost prohibitive 5 years ago so care management benefits check is pending to assess cost options, message sent to CM to please update chart with cost result when available   3. Aortic insufficiency by echo - 2D echo this admission shows mild-moderate AI and aortic sclerosis without stenosis - continue to follow as OP   4. CKD stage IIIb, baseline Cr appears 1.5-1.9 range - admit value 1.74 then 1.57 -> 1.85 -> 1.9  - slight rise noted since nadir value, but still reasonably within prior baseline so will not make any acute changes acutely today - hold ACEi for diuresis in context of CKD   5. Possible CAP - question of consolidation on CXR/CT, will defer abx to primary team   6. Recent fall with thigh hematoma - agree with PT/OT, currently recommended for HHPT and 24-hour supervision - ongoing outpatient f/u will be needed to assess ongoing fall risk - anticoag on hold as above - per CT, recommend f/u scan in several months to ensure resolution and exclude mass   7. Normocytic anemia - prior baseline in the 10-11 range, today 9.8, need to continue to follow, question due to thigh hematoma - no bleeding reported by  patient - order screening FOBT - further management per primary team    8. Aortic dilatation - borderline dilatation of the aortic root, measuring 37 mm, and moderate dilatation of ascending aorta measuring 78mm - OP monitoring if clinically appropriate at that time   For questions or updates, please contact [place] Please consult [email or web address] for contact info under Cardiology/STEMI.  Pixie Casino, MD, [facility], [place] Director of the [facility]  Card[facility] Diplomate of the American Board of Clinical Lipidology Attending Cardiologist  Direct Dial: [phone]  Fax: [phone]  Website:  [email or web address]  Pixie Casino, MD  07/15/2020, 10:31 AM

## 2020-07-15 NOTE — Progress Notes (Signed)
PROGRESS NOTE    [name]  PYP:[id number] DOB: [birth year] DOA: 07/11/2020 PCP: Midge Minium, MD   Chief Complaint  Patient presents with   Leg Swelling   Brief Narrative: 87 year old male with A. fib on Coumadin, diastolic CHF, CKD stage IIIb, HTN, T2DM chronic venous insufficiency followed at wound clinic admitted with shortness of breath progressively worsening swelling of lower extremities including scrotum, orthopnea. Patient seen in the ED and CTA chest negative for PE but bilateral pleural effusion left greater than right, also right thigh bruising CT showed hematoma 8 X3 centimeter, lower extremity duplex negative for DVT BNP 420 INR 3.8 hemoglobin 11.9 g. Penicillin antibiotics to cover for pneumonia subsequently on IV Lasix for volume overload, cardio consulted Coumadin held.  Subjective:  Seen/examined Leg edematous No new complaints  Assessment & Plan:  Acute on chronic diastolic CHF exacerbation Bilateral pleural effusion due to #1 LVEF ~ 60% dilated LA with LE edema/scrotal edema/shortness of breath/pleural effusion.  Continue aggressive diuresis,cont Lasix 80 mg iv BID- cont per cardiology. Hopefully transition to po soon for home. Continue to monitor intake output, daily weight.Wt down 233> 227>231 lb ( pending today). Continue fluid and salt restrictions.Pro-Cal is negative,antibiotic were discontinued, no pneumonia Intake/Output Summary (Last 24 hours) at 07/15/2020 1009 Last data filed at 07/15/2020 0941 Gross per 24 hour  Intake 840 ml  Output 1925 ml  Net -1085 ml     Wt Readings from Last 3 Encounters:  07/14/20 104.8 kg  06/18/20 106.2 kg  07/08/19 97.5 kg   AKI on CKD stage IIIb Baseline creatinine 1.4-1.5. creat at 1.9.  Monitor closely while on diuresis still edematous needs diuresis unfortunately.?CRS. Recent Labs  Lab 07/11/20 1556 07/12/20 0427 07/13/20 0437 07/14/20 0435 07/15/20 0437  BUN 23 26* 26* 29* 33*  CREATININE 1.74*  1.54* 1.85* 1.90* 1.91*   Right thigh hematoma, in the setting of supported INR, Coumadin on hold negative for duplex CT lower extremity reviewed.  Planning for DOAC upon discharge   A. fib chronic on Coumadin currently on hold due to hematoma may need DOAC on discharge defer to cardiology  Morbid obesity with BMI 32 would benefit with weight loss  Chronic venous insufficiency followed up at wound care  Essential hypertension on Toprol and diuresis  Normocytic anemia monitor  Type 2 diabetes mellitus blood sugar stable continue SSI Recent Labs  Lab 07/14/20 0720 07/14/20 1123 07/14/20 1651 07/14/20 2158 07/15/20 0720  GLUCAP 91 117* 120* 112* 92     Lab Results  Component Value Date   HGBA1C 5.6 07/11/2020    Hyperlipidemia: On Statin    Diet Order             Diet Carb Modified Fluid consistency: Thin; Room service appropriate? Yes  Diet effective now                   Patient's Body mass index is 32.22 kg/m.  DVT prophylaxis: Place and maintain sequential compression device Start: 07/12/20 0139 Code Status:   Code Status: DNR  Family Communication: plan of care discussed with patient at bedside. Self updating his wife. [place] to call. Status is: Inpatient Remains inpatient appropriate because:IV treatments appropriate due to intensity of illness or inability to take PO and Inpatient level of care appropriate due to severity of illness Dispo: The patient is from: Home              Anticipated d/c is to: Home w/ HH hopefully in 1-2 days  once on po lasix and cleared by cardio              Patient currently is not medically stable to d/c.   Difficult to place patient No Unresulted Labs (From admission, onward)     Start     Ordered   07/13/20 0500  Protime-INR  Daily,   R      07/12/20 1124   07/13/20 0093  Basic metabolic panel  Daily,   R      07/12/20 1516   07/13/20 0500  CBC with Differential/Platelet  Daily,   R      07/12/20 1517   07/13/20 0500   Magnesium  Daily,   R      07/12/20 1517   Unscheduled  Occult blood card to lab, stool RN will collect  As needed,   R     Question:  Specimen to be collected by:  Answer:  RN will collect   07/14/20 8182   Unscheduled  Occult blood card to lab, stool RN will collect  As needed,   R     Question:  Specimen to be collected by:  Answer:  RN will collect   07/14/20 2121           Medications reviewed: Scheduled Meds:  allopurinol  300 mg Oral Daily   docusate sodium  100 mg Oral BID   feeding supplement (GLUCERNA SHAKE)  237 mL Oral BID BM   folic acid  1 mg Oral Daily   furosemide  80 mg Intravenous BID   insulin aspart  0-5 Units Subcutaneous QHS   insulin aspart  0-9 Units Subcutaneous TID WC   metoprolol succinate  50 mg Oral Daily   simvastatin  20 mg Oral Daily   sodium chloride flush  3 mL Intravenous Q12H   Continuous Infusions:  sodium chloride     Consultants:see note  Procedures:see note Antimicrobials: Anti-infectives (From admission, onward)    Start     Dose/Rate Route Frequency Ordered Stop   07/12/20 0200  cefTRIAXone (ROCEPHIN) 2 g in sodium chloride 0.9 % 100 mL IVPB  Status:  Discontinued        2 g 200 mL/hr over 30 Minutes Intravenous Every 24 hours 07/12/20 0132 07/12/20 0750   07/12/20 0200  azithromycin (ZITHROMAX) 500 mg in sodium chloride 0.9 % 250 mL IVPB  Status:  Discontinued        500 mg 250 mL/hr over 60 Minutes Intravenous Every 24 hours 07/12/20 0132 07/12/20 0750      Culture/Microbiology No results found for: SDES, SPECREQUEST, CULT, REPTSTATUS  Other culture-see note  Objective: Vitals: Today's Vitals   07/14/20 2145 07/14/20 2216 07/15/20 0501 07/15/20 0800  BP:   (!) 119/55   Pulse:   67   Resp:   18   Temp:   97.7 F (36.5 C)   TempSrc:   Oral   SpO2:   96%   Weight:      Height:      PainSc: 6  3   0-No pain    Intake/Output Summary (Last 24 hours) at 07/15/2020 1009 Last data filed at 07/15/2020 0941 Gross per 24  hour  Intake 840 ml  Output 1925 ml  Net -1085 ml    Filed Weights   07/11/20 1544 07/13/20 0758 07/14/20 0500  Weight: 106 kg 103.3 kg 104.8 kg   Weight change:   Intake/Output from previous day: 07/13 0701 - 07/14 0700 In: 480 [P.O.:480]  Out: 1775 [WFUXN:2355] Intake/Output this shift: Total I/O In: 360 [P.O.:360] Out: 150 [Urine:150] Filed Weights   07/11/20 1544 07/13/20 0758 07/14/20 0500  Weight: 106 kg 103.3 kg 104.8 kg   Examination: General exam: AAOx 3, pleasant. HEENT:Oral mucosa moist, Ear/Nose WNL grossly, dentition normal. Respiratory system: bilaterally clear breath sounds, no use of accessory muscle Cardiovascular system: S1 & S2 +, No JVD,. Gastrointestinal system: Abdomen soft, NT,ND, BS+ Nervous System:Alert, awake, moving extremities and grossly nonfocal Extremities: Pitting edema lower extremities, distal peripheral pulses palpable.  Skin: No rashes,no icterus. MSK: Normal muscle bulk,tone, power   Data Reviewed: I have personally reviewed following labs and imaging studies CBC: Recent Labs  Lab 07/11/20 1749 07/12/20 0427 07/13/20 0437 07/14/20 0435 07/15/20 0437  WBC 6.0 5.8 6.4 5.7 6.2  NEUTROABS 3.9 4.0 4.3 3.7 4.1  HGB 11.9* 11.0* 10.4* 9.8* 9.3*  HCT 37.1* 33.7* 32.1* 31.0* 29.4*  MCV 96.1 95.5 94.7 97.8 96.7  PLT 305 300 271 229 732    Basic Metabolic Panel: Recent Labs  Lab 07/11/20 1556 07/12/20 0427 07/13/20 0437 07/14/20 0435 07/15/20 0437  NA 137 140 139 137 137  K 4.4 3.8 4.0 4.1 3.9  CL 103 104 104 102 101  CO2 29 28 28 27 29   GLUCOSE 97 106* 99 96 91  BUN 23 26* 26* 29* 33*  CREATININE 1.74* 1.54* 1.85* 1.90* 1.91*  CALCIUM 8.6* 8.7* 8.6* 8.7* 8.7*  MG  --  2.2 1.9 2.1 2.0  PHOS  --  2.7  --   --   --     GFR: Estimated Creatinine Clearance: 33.6 mL/min (A) (by C-G formula based on SCr of 1.91 mg/dL (H)). Liver Function Tests: Recent Labs  Lab 07/11/20 1556 07/12/20 0427  AST 22 23  ALT 11 11  ALKPHOS  93 93  BILITOT 2.4* 2.5*  PROT 6.5 6.5  ALBUMIN 2.9* 3.0*    No results for input(s): LIPASE, AMYLASE in the last 168 hours. No results for input(s): AMMONIA in the last 168 hours. Coagulation Profile: Recent Labs  Lab 07/11/20 1556 07/12/20 0427 07/13/20 0437 07/14/20 0435 07/15/20 0437  INR 3.8* 3.6* 3.2* 2.8* 2.2*    Cardiac Enzymes: No results for input(s): CKTOTAL, CKMB, CKMBINDEX, TROPONINI in the last 168 hours. BNP (last 3 results) No results for input(s): PROBNP in the last 8760 hours. HbA1C: No results for input(s): HGBA1C in the last 72 hours.  CBG: Recent Labs  Lab 07/14/20 0720 07/14/20 1123 07/14/20 1651 07/14/20 2158 07/15/20 0720  GLUCAP 91 117* 120* 112* 92    Lipid Profile: No results for input(s): CHOL, HDL, LDLCALC, TRIG, CHOLHDL, LDLDIRECT in the last 72 hours. Thyroid Function Tests: No results for input(s): TSH, T4TOTAL, FREET4, T3FREE, THYROIDAB in the last 72 hours.  Anemia Panel: No results for input(s): VITAMINB12, FOLATE, FERRITIN, TIBC, IRON, RETICCTPCT in the last 72 hours.  Sepsis Labs: Recent Labs  Lab 07/12/20 0427 07/13/20 0437  PROCALCITON <0.10 <0.10     Recent Results (from the past 240 hour(s))  Resp Panel by RT-PCR (Flu A&B, Covid) Nasopharyngeal Swab     Status: None   Collection Time: 07/11/20  6:10 PM   Specimen: Nasopharyngeal Swab; Nasopharyngeal(NP) swabs in vial transport medium  Result Value Ref Range Status   SARS Coronavirus 2 by RT PCR NEGATIVE NEGATIVE Final    Comment: (NOTE) SARS-CoV-2 target nucleic acids are NOT DETECTED.  The SARS-CoV-2 RNA is generally detectable in upper respiratory specimens during the acute phase of  infection. The lowest concentration of SARS-CoV-2 viral copies this assay can detect is 138 copies/mL. A negative result does not preclude SARS-Cov-2 infection and should not be used as the sole basis for treatment or other patient management decisions. A negative result may  occur with  improper specimen collection/handling, submission of specimen other than nasopharyngeal swab, presence of viral mutation(s) within the areas targeted by this assay, and inadequate number of viral copies(<138 copies/mL). A negative result must be combined with clinical observations, patient history, and epidemiological information. The expected result is Negative.  Fact Sheet for Patients:  EntrepreneurPulse.com.au  Fact Sheet for Healthcare Providers:  IncredibleEmployment.be  This test is no t yet approved or cleared by the [place] FDA and  has been authorized for detection and/or diagnosis of SARS-CoV-2 by FDA under an Emergency Use Authorization (EUA). This EUA will remain  in effect (meaning this test can be used) for the duration of the COVID-19 declaration under Section 564(b)(1) of the Act, 21 U.S.C.section 360bbb-3(b)(1), unless the authorization is terminated  or revoked sooner.       Influenza A by PCR NEGATIVE NEGATIVE Final   Influenza B by PCR NEGATIVE NEGATIVE Final    Comment: (NOTE) The Xpert Xpress SARS-CoV-2/FLU/RSV plus assay is intended as an aid in the diagnosis of influenza from Nasopharyngeal swab specimens and should not be used as a sole basis for treatment. Nasal washings and aspirates are unacceptable for Xpert Xpress SARS-CoV-2/FLU/RSV testing.  Fact Sheet for Patients: EntrepreneurPulse.com.au  Fact Sheet for Healthcare Providers: IncredibleEmployment.be  This test is not yet approved or cleared by the [place] FDA and has been authorized for detection and/or diagnosis of SARS-CoV-2 by FDA under an Emergency Use Authorization (EUA). This EUA will remain in effect (meaning this test can be used) for the duration of the COVID-19 declaration under Section 564(b)(1) of the Act, 21 U.S.C. section 360bbb-3(b)(1), unless the authorization is terminated  or revoked.  Performed at [facility], [place] [address] [place], [place] [address]       Radiology Studies: No results found.   LOS: 4 days   Antonieta Pert, MD [facility]  07/15/2020, 10:09 AM

## 2020-07-15 NOTE — Progress Notes (Signed)
Physical Therapy Treatment Patient Details Name: [name] MRN: [id number] DOB: 1931/04/09 Today's Date: 07/15/2020    History of Present Illness 87 yo male admitted with CHF, bil LE edema, pleural effusion, fall at home recently. Hx of CHF, DM, A fib, CKD, obesity, venous insufficiency, falls.    PT Comments    Pt in bed on RA sats 97% and HR 74.  Feeling "okay".  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  Assisted OOB to amb in hallway.  General Gait Details: First amb with his "walking stick" approx 250 feet present with mild gait instability with slow spped and short shuffled steps.  Second, amb with RW present with improved gait stability with increased gait speed, increased stride length and increased upright posture.  Advised pt to use walker for "outside"of home community use and he agreed.  He plans to use "furniture" in home. Then assisted to bathroom.  General transfer comment: close Supervision and good use of B UE's to steady self.  Admits to using furniture/walls and doorways at home.  Self able to rise and lower from toilet with use of grab bar.  Fair static standing balance. Assisted back to bed per pt request to rest. Pt plans to return home with spouse.   Follow Up Recommendations  Home health PT;Supervision/Assistance - 24 hour     Equipment Recommendations  Rolling walker with 5" wheels    Recommendations for Other Services       Precautions / Restrictions Precautions Precautions: Fall Precaution Comments: B LE edema Restrictions Weight Bearing Restrictions: No    Mobility  Bed Mobility Overal bed mobility: Modified Independent             General bed mobility comments: self able with increased time and use of rail    Transfers Overall transfer level: Needs assistance Equipment used: None Transfers: Sit to/from Stand;Stand Pivot Transfers Sit to Stand: Supervision Stand pivot transfers:  Supervision;Min guard       General transfer comment: close Supervision and good use of B UE's to steady self.  Admits to using furniture/walls and doorways at home.  Self able to rise and lower from toilet with use of grab bar.  Fair static standing balance.  Ambulation/Gait Ambulation/Gait assistance: Supervision;Min guard Gait Distance (Feet): 500 Feet Assistive device: Straight cane;Rolling walker (2 wheeled)       General Gait Details: First amb with his "walking stick" approx 250 feet present with mild gait instability with slow spped and short shuffled steps.  Second, amb with RW present with improved gait stability with increased gait speed, increased stride length and increased upright posture.  Advised pt to use walker for "outside"of home community use and he agreed.  He plans to use "furniture" in home.   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      Exercises      General Comments        Pertinent Vitals/Pain Pain Assessment: No/denies pain    Home Living                      Prior Function            PT Goals (current goals can now be found in the care plan section)      Frequency    Min 3X/week      PT Plan Current plan remains appropriate    Co-evaluation              AM-PAC PT "6 Clicks" Mobility   Outcome Measure  Help needed turning from your back to your side while in a flat bed without using bedrails?: None Help needed moving from lying on your back to sitting on the side of a flat  bed without using bedrails?: A Little Help needed moving to and from a bed to a chair (including a wheelchair)?: A Little Help needed standing up from a chair using your arms (e.g., wheelchair or bedside chair)?: A Little Help needed to walk in hospital room?: A Little Help needed climbing 3-5 steps with a railing? : A Little 6 Click Score: 19    End of Session Equipment Utilized During Treatment: Gait belt Activity Tolerance: Patient tolerated treatment well Patient left: in bed;with call bell/phone within reach;with bed alarm set   PT Visit Diagnosis: Muscle weakness (generalized) (M62.81);History of falling (Z91.81);Unsteadiness on feet (R26.81)     Time: 7062-3762 PT Time Calculation (min) (ACUTE ONLY): 35 min  Charges:  $Gait Training: 8-22 mins $Therapeutic Activity: 8-22 mins                     Rica Koyanagi  PTA Acute  Rehabilitation Services Pager      [phone] Office      [phone]

## 2020-07-15 NOTE — Chronic Care Management (AMB) (Signed)
Chronic Care Management Pharmacy Assistant   Name: [name]  MRN: [id number] DOB: 1931-12-15  Reason for Encounter: Disease State - Hypertension Adherence Call   Recent office visits:  06/18/2020 OV PCP Midge Minium, MD; STOP Furosemide, START Torsemide once daily  Recent consult visits:  None  Hospital visits:  Medication Reconciliation was completed by comparing discharge summary, patient's EMR and Pharmacy list, and upon discussion with patient.  Admitted to the hospital on 07/11/2020 due to CHF exacerbation possible PNA. Discharged from [facility] on 07/21/2020.   -Discontinued Warfarin, started Eliquis during his hospital visit.  -Increase Torsemide to 40 mg two times daily.  Medications: Facility-Administered Encounter Medications as of 07/15/2020  Medication   0.9 %  sodium chloride infusion   acetaminophen (TYLENOL) tablet 650 mg   Or   acetaminophen (TYLENOL) suppository 650 mg   allopurinol (ZYLOPRIM) tablet 300 mg   docusate sodium (COLACE) capsule 100 mg   feeding supplement (GLUCERNA SHAKE) (GLUCERNA SHAKE) liquid 124 mL   folic acid (FOLVITE) tablet 1 mg   furosemide (LASIX) injection 80 mg   HYDROcodone-acetaminophen (NORCO/VICODIN) 5-325 MG per tablet 1-2 tablet   insulin aspart (novoLOG) injection 0-5 Units   insulin aspart (novoLOG) injection 0-9 Units   metoprolol succinate (TOPROL-XL) 24 hr tablet 50 mg   simvastatin (ZOCOR) tablet 20 mg   sodium chloride flush (NS) 0.9 % injection 3 mL   sodium chloride flush (NS) 0.9 % injection 3 mL   Outpatient Encounter Medications as of 07/15/2020  Medication Sig Note   allopurinol (ZYLOPRIM) 300 MG tablet TAKE 1 TABLET (300 MG TOTAL) BY MOUTH DAILY.    Calcium Carbonate-Vit D-Min (CALCIUM 1200 PO) Take by mouth.    lisinopril (ZESTRIL) 2.5 MG tablet TAKE 1 TABLET BY MOUTH DAILY. PLEASE MAKE APPT. THIS WILL BE THE LAST REFILL WITHOUT AN APPT    metFORMIN (GLUCOPHAGE) 500 MG  tablet TAKE 1 TABLET BY MOUTH EVERY DAY    metoprolol succinate (TOPROL-XL) 50 MG 24 hr tablet TAKE 1 TABLET BY MOUTH EVERY DAY    simvastatin (ZOCOR) 20 MG tablet TAKE 1 TABLET BY MOUTH EVERY DAY    torsemide (DEMADEX) 20 MG tablet Take 1 tablet (20 mg total) by mouth daily.    vitamin B-12 (CYANOCOBALAMIN) 500 MCG tablet Take 500 mcg by mouth daily.    warfarin (COUMADIN) 5 MG tablet TAKE AS DIRECTED BY ANTICOAGULATION CLINIC 07/12/2020: 5mg  qd except Monday 2.5mg  on Monday   Recent Relevant Labs: Lab Results  Component Value Date/Time   HGBA1C 5.6 07/11/2020 05:49 PM   HGBA1C 6.0 06/18/2020 01:15 PM   HGBA1C 6.1 08/01/2019 12:00 AM   HGBA1C 6.6 02/20/2017 12:00 AM   MICROALBUR 0.8 02/26/2014 11:28 AM   MICROALBUR 0.7 02/06/2006 11:39 AM    Kidney Function Lab Results  Component Value Date/Time   CREATININE 1.91 (H) 07/15/2020 04:37 AM   CREATININE 1.90 (H) 07/14/2020 04:35 AM   CREATININE 1.71 (H) 06/21/2018 02:36 PM   CREATININE 1.06 08/07/2016 10:29 AM   GFR 39.46 (L) 06/18/2020 01:15 PM   GFRNONAA 34 (L) 07/15/2020 04:37 AM   GFRAA 58 (L) 01/23/2016 05:15 AM    I called and spoke with the patient's daughter. She states the patient is currently recovering from COVID-19. We spoke in great length of the patient's current medication list as she put his medications together. The patient has had some medication changes during his most recent hospital stay. He was started on Eliquis in  place of warfarin. His Torsemide was increased to 40 mg twice a day. She states he is getting better little by little each day. She states the patient is not currently checking his blood sugars at home and never has done so. She denies having any problems with his medications or  any problems getting them.  Current antihyperglycemic regimen:  Metformin 500 mg daily  What recent interventions/DTPs have been made to improve glycemic control:  None  Patient denies hypoglycemic symptoms.  Patient  denies hyperglycemic symptoms.  How often are you checking your blood sugar? None  What are your blood sugars ranging?  Fasting: none Before meals: none After meals: none Bedtime: none  During the week, how often does your blood glucose drop below 70?  Unknown, doubftul due to patients lack of hypoglycemic symptoms  Are you checking your feet daily/regularly? Yes  Adherence Review: Is the patient currently on a STATIN medication? Yes Is the patient currently on ACE/ARB medication? Yes Does the patient have >5 day gap between last estimated fill dates? No    Future Appointments  Date Time Provider [place]  10/15/2020  2:00 PM Belva Crome, MD [facility]  12/14/2020 12:30 PM Midge Minium, MD [facility]    Star Rating Drugs: Lisinopril 2.5 mg last filled 06/28/2020 90 DS Simvastatin 20 mg last filled 05/24/2020 90 DS Metformin 500 mg last filled 04/29/2020 90 DS  April D Calhoun, [place] Pharmacist Assistant [phone]

## 2020-07-16 LAB — BASIC METABOLIC PANEL
Anion gap: 8 (ref 5–15)
BUN: 36 mg/dL — ABNORMAL HIGH (ref 8–23)
CO2: 28 mmol/L (ref 22–32)
Calcium: 8.7 mg/dL — ABNORMAL LOW (ref 8.9–10.3)
Chloride: 99 mmol/L (ref 98–111)
Creatinine, Ser: 1.85 mg/dL — ABNORMAL HIGH (ref 0.61–1.24)
GFR, Estimated: 35 mL/min — ABNORMAL LOW (ref >60)
Glucose, Bld: 92 mg/dL (ref 70–99)
Potassium: 3.8 mmol/L (ref 3.5–5.1)
Sodium: 135 mmol/L (ref 135–145)

## 2020-07-16 LAB — CBC WITH DIFFERENTIAL/PLATELET
Abs Immature Granulocytes: 0.01 10*3/uL (ref 0.00–0.07)
Basophils Absolute: 0.1 10*3/uL (ref 0.0–0.1)
Basophils Relative: 1 %
Eosinophils Absolute: 0.3 10*3/uL (ref 0.0–0.5)
Eosinophils Relative: 5 %
HCT: 31.3 % — ABNORMAL LOW (ref 39.0–52.0)
Hemoglobin: 10 g/dL — ABNORMAL LOW (ref 13.0–17.0)
Immature Granulocytes: 0 %
Lymphocytes Relative: 17 %
Lymphs Abs: 0.8 10*3/uL (ref 0.7–4.0)
MCH: 31.1 pg (ref 26.0–34.0)
MCHC: 31.9 g/dL (ref 30.0–36.0)
MCV: 97.2 fL (ref 80.0–100.0)
Monocytes Absolute: 0.8 10*3/uL (ref 0.1–1.0)
Monocytes Relative: 16 %
Neutro Abs: 2.9 10*3/uL (ref 1.7–7.7)
Neutrophils Relative %: 61 %
Platelets: 244 10*3/uL (ref 150–400)
RBC: 3.22 MIL/uL — ABNORMAL LOW (ref 4.22–5.81)
RDW: 17.4 % — ABNORMAL HIGH (ref 11.5–15.5)
WBC: 4.8 10*3/uL (ref 4.0–10.5)
nRBC: 0 % (ref 0.0–0.2)

## 2020-07-16 LAB — GLUCOSE, CAPILLARY
Glucose-Capillary: 87 mg/dL (ref 70–99)
Glucose-Capillary: 276 mg/dL — ABNORMAL HIGH (ref 70–99)
Glucose-Capillary: 90 mg/dL (ref 70–99)
Glucose-Capillary: 110 mg/dL — ABNORMAL HIGH (ref 70–99)

## 2020-07-16 LAB — MAGNESIUM: Magnesium: 2.2 mg/dL (ref 1.7–2.4)

## 2020-07-16 LAB — PROTIME-INR
INR: 1.7 — ABNORMAL HIGH (ref 0.8–1.2)
Prothrombin Time: 20.3 seconds — ABNORMAL HIGH (ref 11.4–15.2)

## 2020-07-16 NOTE — TOC Progression Note (Signed)
Transition of Care [facility]) - Progression Note    Patient Details  Name: [name] MRN: [id number] Date of Birth: 20-Jul-1931  [facility] of [facility]) CM/SW Contact  Channell Quattrone, Juliann Pulse, RN Phone Number: 07/16/2020, 3:48 PM  Clinical Narrative: Confirmed [facility] has already delivered rw to rm;patient & dtr declined tub bench.[place] rep aware.      Expected Discharge Plan: [place] Barriers to Discharge: Continued Medical Work up  Expected Discharge Plan and Services Expected Discharge Plan: [place]   Discharge Planning Services: CM Consult   Living arrangements for the past 2 months: Single Family Home                 DME Arranged: Tub bench, Walker rolling DME Agency: [employer] Date DME Agency Contacted: 07/13/20 Time DME Agency Contacted: [phone] Representative spoke with at DME Agency: [place] ([place]) Interventions    Readmission Risk Interventions Readmission Risk Prevention Plan 07/12/2020  Transportation Screening Complete  PCP or Specialist Appt within 5-7 Days Complete  Home Care Screening Complete  Medication Review (RN CM) Complete  Some recent data might be hidden

## 2020-07-16 NOTE — Progress Notes (Signed)
Physical Therapy Treatment Patient Details Name: [name] MRN: [id number] DOB: Aug 09, 1931 Today's Date: 07/16/2020    History of Present Illness 87 yo male admitted with CHF, bil LE edema, pleural effusion, fall at home recently. Hx of CHF, DM, A fib, CKD, obesity, venous insufficiency, falls.    PT Comments    Pt semi-supine in bed RA sats 96% and HR 80. Assisted OOB to amb in hallway. Good carryover from last session with RW. Improved upright posture, increased gait speed and improved overall stability. No overt LOB, no c/o of dyspnea during ambulation. Continued to reinforce advice to pt to use RW in community, he continues to agree. After 400 feet ambulation assisted pt back to bed at pt request. RN present at end of session.    Follow Up Recommendations  Home health PT;Supervision/Assistance - 24 hour     Equipment Recommendations  Rolling walker with 5" wheels    Recommendations for Other Services       Precautions / Restrictions Precautions Precautions: Fall Precaution Comments: B LE edema Restrictions Weight Bearing Restrictions: No    Mobility  Bed Mobility Overal bed mobility: Modified Independent Bed Mobility: Supine to Sit;Sit to Supine     Supine to sit: Modified independent (Device/Increase time);HOB elevated Sit to supine: Modified independent (Device/Increase time)   General bed mobility comments: self able with increased time and use of rail    Transfers Overall transfer level: Needs assistance Equipment used: None Transfers: Sit to/from [facility] Sit to Stand: Supervision Stand pivot transfers: Supervision;Min guard       General transfer comment: close Supervision and good use of B UE's to steady self.  Ambulation/Gait Ambulation/Gait assistance: Supervision Gait Distance (Feet): 400 Feet Assistive device: Rolling walker (2 wheeled) Gait Pattern/deviations: Step-through pattern;Wide base of support     General Gait  Details: good carryover from last session with RW, posture more upright and increased gait speed.   Stairs             Wheelchair Mobility    Modified Rankin (Stroke Patients Only)       Balance Overall balance assessment: Mild deficits observed, not formally tested   Sitting balance-Leahy Scale: Good     Standing balance support: Bilateral upper extremity supported Standing balance-Leahy Scale: Fair                              Cognition Arousal/Alertness: Awake/alert Behavior During Therapy: WFL for tasks assessed/performed Overall Cognitive Status: Within Functional Limits for tasks assessed                                        Exercises      General Comments        Pertinent Vitals/Pain Pain Assessment: Faces Faces Pain Scale: Hurts a little bit Pain Location: bil LEs Pain Descriptors / Indicators: Discomfort;Guarding Pain Intervention(s): Limited activity within patient's tolerance;Monitored during session    Home Living                      Prior Function            PT Goals (current goals can now be found in the care plan section) Acute Rehab PT Goals Patient Stated Goal: get better PT Goal Formulation: With patient/family Time For Goal Achievement: 07/26/20 Potential to Achieve Goals:  Good Progress towards PT goals: Progressing toward goals    Frequency    Min 3X/week      PT Plan Current plan remains appropriate    Co-evaluation              AM-PAC PT "6 Clicks" Mobility   Outcome Measure  Help needed turning from your back to your side while in a flat bed without using bedrails?: None Help needed moving from lying on your back to sitting on the side of a flat bed without using bedrails?: A Little Help needed moving to and from a bed to a chair (including a wheelchair)?: A Little Help needed standing up from a chair using your arms (e.g., wheelchair or bedside chair)?: A Little Help  needed to walk in hospital room?: A Little Help needed climbing 3-5 steps with a railing? : A Little 6 Click Score: 19    End of Session Equipment Utilized During Treatment: Gait belt Activity Tolerance: Patient tolerated treatment well Patient left: in bed;with call bell/phone within reach;with bed alarm set;with nursing/sitter in room Nurse Communication: Mobility status PT Visit Diagnosis: Muscle weakness (generalized) (M62.81);History of falling (Z91.81);Unsteadiness on feet (R26.81)     Time: 1530-1550 PT Time Calculation (min) (ACUTE ONLY): 20 min  Charges:  $Gait Training: 8-22 mins                     Ernst Spell, PTA Student  Acute Rehabilitation Services Pager : [phone] Office : [place] 07/16/2020, 4:00 PM

## 2020-07-16 NOTE — Progress Notes (Signed)
Progress Note  Patient Name: [name] Date of Encounter: 07/16/2020  Primary Cardiologist: Sinclair Grooms, MD  Subjective   Net negative another 900 cc. Creatinine improved further to 1.85.  Inpatient Medications    Scheduled Meds:  allopurinol  300 mg Oral Daily   docusate sodium  100 mg Oral BID   feeding supplement (GLUCERNA SHAKE)  237 mL Oral BID BM   folic acid  1 mg Oral Daily   furosemide  80 mg Intravenous BID   insulin aspart  0-5 Units Subcutaneous QHS   insulin aspart  0-9 Units Subcutaneous TID WC   metoprolol succinate  50 mg Oral Daily   simvastatin  20 mg Oral Daily   sodium chloride flush  3 mL Intravenous Q12H   Continuous Infusions:  sodium chloride     PRN Meds: sodium chloride, acetaminophen **OR** acetaminophen, HYDROcodone-acetaminophen, sodium chloride flush   Vital Signs    Vitals:   07/15/20 0501 07/15/20 1304 07/15/20 2104 07/16/20 0558  BP: (!) 119/55 (!) 120/48 (!) 114/46 (!) 107/53  Pulse: 67 60 68 64  Resp: 18 16 14 16   Temp: 97.7 F (36.5 C) 97.7 F (36.5 C) (!) 97.5 F (36.4 C) (!) 97.4 F (36.3 C)  TempSrc: Oral  Oral Oral  SpO2: 96% 99% 98% 95%  Weight:    100.3 kg  Height:        Intake/Output Summary (Last 24 hours) at 07/16/2020 1152 Last data filed at 07/16/2020 6384 Gross per 24 hour  Intake 840 ml  Output 2475 ml  Net -1635 ml   Last 3 Weights 07/16/2020 07/14/2020 07/13/2020  Weight (lbs) 221 lb 1.9 oz 231 lb 0.7 oz 227 lb 11.8 oz  Weight (kg) 100.3 kg 104.8 kg 103.3 kg     Telemetry    Afib in the 70's-  Personally Reviewed  Physical Exam   General appearance: alert and no distress Neck: no carotid bruit, no JVD, and thyroid not enlarged, symmetric, no tenderness/mass/nodules Lungs: clear to auscultation bilaterally Heart: irregularly irregular rhythm Abdomen: soft, non-tender; bowel sounds normal; no masses,  no organomegaly Extremities: edema 1-2+ Pulses: 2+ and symmetric Skin: Skin color,  texture, turgor normal. No rashes or lesions Neurologic: Grossly normal Psych: Pleasant   Labs    High Sensitivity Troponin:   Recent Labs  Lab 07/11/20 1556 07/11/20 1749  TROPONINIHS 11 11      Cardiac EnzymesNo results for input(s): TROPONINI in the last 168 hours. No results for input(s): TROPIPOC in the last 168 hours.   Chemistry Recent Labs  Lab 07/11/20 1556 07/12/20 0427 07/13/20 0437 07/14/20 0435 07/15/20 0437 07/16/20 0422  NA 137 140   < > 137 137 135  K 4.4 3.8   < > 4.1 3.9 3.8  CL 103 104   < > 102 101 99  CO2 29 28   < > 27 29 28   GLUCOSE 97 106*   < > 96 91 92  BUN 23 26*   < > 29* 33* 36*  CREATININE 1.74* 1.54*   < > 1.90* 1.91* 1.85*  CALCIUM 8.6* 8.7*   < > 8.7* 8.7* 8.7*  PROT 6.5 6.5  --   --   --   --   ALBUMIN 2.9* 3.0*  --   --   --   --   AST 22 23  --   --   --   --   ALT 11 11  --   --   --   --  ALKPHOS 93 93  --   --   --   --   BILITOT 2.4* 2.5*  --   --   --   --   GFRNONAA 37* 43*   < > 34* 34* 35*  ANIONGAP 5 8   < > 8 7 8    < > = values in this interval not displayed.     Hematology Recent Labs  Lab 07/14/20 0435 07/15/20 0437 07/16/20 0422  WBC 5.7 6.2 4.8  RBC 3.17* 3.04* 3.22*  HGB 9.8* 9.3* 10.0*  HCT 31.0* 29.4* 31.3*  MCV 97.8 96.7 97.2  MCH 30.9 30.6 31.1  MCHC 31.6 31.6 31.9  RDW 17.6* 17.5* 17.4*  PLT 229 245 244    BNP Recent Labs  Lab 07/11/20 1556  BNP 420.8*     DDimer No results for input(s): DDIMER in the last 168 hours.   Radiology    No results found.  Cardiac Studies   2D Echo 07/12/20  1. Left ventricular ejection fraction, by estimation, is 60 to 65%. The  left ventricle has normal function. The left ventricle has no regional  wall motion abnormalities. Diastolic function indeterminant due to Afib.   2. Right ventricular systolic function is normal. The right ventricular  size is normal. There is moderately elevated pulmonary artery systolic  pressure. The estimated right  ventricular systolic pressure is 07.6 mmHg.   3. Left atrial size was severely dilated.   4. Right atrial size was mildly dilated.   5. Moderate pleural effusion in the left lateral region.   6. The mitral valve is grossly normal. Mild mitral valve regurgitation.   7. The aortic valve is tricuspid. There is mild calcification of the  aortic valve. There is moderate thickening of the aortic valve. Aortic  valve regurgitation is mild to moderate. Mild to moderate aortic valve  sclerosis/calcification is present,  without any evidence of aortic stenosis.   8. Aortic dilatation noted. There is borderline dilatation of the aortic  root, measuring 37 mm. There is moderate dilatation of the ascending  aorta, measuring 40 mm.   9. The inferior vena cava is dilated in size with <50% respiratory  variability, suggesting right atrial pressure of 15 mmHg.   Comparison(s): Compared to prior TTE in 11/2018, a left sided moderate  pleural effusion is now present.  Patient Profile     87 y.o. male with chronic diastolic CHF, chronic atrial fibrillation, moderate AI by echo 11/2018, prior liver abnormalities on imaging, anemia, CKD stage III (borderline a-b, baseline Cr appears 1.5-1.9), varicose veins/chronic venous insufficiency followed by wound clinic, DM, HTN, HLD, GIB/stercoral ulcer of rectum 2018, vitamin B 12 deficiency who presented to the hospital with several week history of increasing SOB, marked LEE and scrotal edema. (Of note: pt recalls someone pointing to his leg and saying he had a blood clot a few weeks ago, but no imaging at that time and LE venous duplex negative for DVT here.) He did sustain a fall several weeks ago as well and imaging this admission shows a thigh hematoma.  Assessment & Plan    1. Lower extremity edema, scrotal edema, and pleural effusions suspected due to acute on chronic diastolic CHF - suspect testicular swelling was more of a sequelae of anasarca rather than  intrinsic urologic process - 2D echo EF 60-65%, no RWMA, moderate pulmonary HTN, severe LAE, mild RAE, mild MR, mild-moderate AI, borderline dilation of aortic root and moderate dilation of ascending aorta - not  clear that I/Os and daily weights are accurate this admission - weight up ~3lb from yesterday but yesterday had excellent UOP of -3.3L (prior to yesterday, I/O's were not well tracked) - continue IV lasix - creatinine improved -possible transition to oral lasix tomorrow   2. Chronic atrial fibrillation - managed with rate control strategy, remains rate controlled on Toprol 50mg  daily - was on warfarin prior to admission, last INR check 04/2020, intermittently supratherapeutic - warfarin currently on hold due to thigh hematoma, await medical clearance on when this can be resumed (INR 2.2 today) - consideration should be given to changing to [place] this admission - cost is a concern, could provide a 30 day free card for Eliquis and would need to do patient assistance.   3. Aortic insufficiency by echo - 2D echo this admission shows mild-moderate AI and aortic sclerosis without stenosis - continue to follow as OP   4. CKD stage IIIb, baseline Cr appears 1.5-1.9 range - admit value 1.74 then 1.57 -> 1.85 -> 1.9  - slight rise noted since nadir value, but still reasonably within prior baseline so will not make any acute changes acutely today - hold ACEi for diuresis in context of CKD   5. Possible CAP - question of consolidation on CXR/CT, will defer abx to primary team   6. Recent fall with thigh hematoma - agree with PT/OT, currently recommended for HHPT and 24-hour supervision - ongoing outpatient f/u will be needed to assess ongoing fall risk - anticoag on hold as above - per CT, recommend f/u scan in several months to ensure resolution and exclude mass   7. Normocytic anemia - prior baseline in the 10-11 range, today 9.8, need to continue to follow, question due to thigh  hematoma - no bleeding reported by patient - order screening FOBT - further management per primary team    8. Aortic dilatation - borderline dilatation of the aortic root, measuring 37 mm, and moderate dilatation of ascending aorta measuring 73mm - OP monitoring if clinically appropriate at that time   For questions or updates, please contact [place] Please consult [email or web address] for contact info under Cardiology/STEMI.  Pixie Casino, MD, [facility], [place] Director of the Advanced Lipid Disorders &  Cardiovascular Risk Reduction Clinic Diplomate of the American Board of Clinical Lipidology Attending Cardiologist  Direct Dial: [phone]  Fax: [phone]  Website:  [email or web address]  Pixie Casino, MD  07/16/2020, 11:52 AM

## 2020-07-17 LAB — CBC WITH DIFFERENTIAL/PLATELET
Abs Immature Granulocytes: 0.02 10*3/uL (ref 0.00–0.07)
Basophils Absolute: 0 10*3/uL (ref 0.0–0.1)
Basophils Relative: 1 %
Eosinophils Absolute: 0.2 10*3/uL (ref 0.0–0.5)
Eosinophils Relative: 4 %
HCT: 33.4 % — ABNORMAL LOW (ref 39.0–52.0)
Hemoglobin: 10.6 g/dL — ABNORMAL LOW (ref 13.0–17.0)
Immature Granulocytes: 0 %
Lymphocytes Relative: 16 %
Lymphs Abs: 0.9 10*3/uL (ref 0.7–4.0)
MCH: 31 pg (ref 26.0–34.0)
MCHC: 31.7 g/dL (ref 30.0–36.0)
MCV: 97.7 fL (ref 80.0–100.0)
Monocytes Absolute: 0.9 10*3/uL (ref 0.1–1.0)
Monocytes Relative: 16 %
Neutro Abs: 3.5 10*3/uL (ref 1.7–7.7)
Neutrophils Relative %: 63 %
Platelets: 265 10*3/uL (ref 150–400)
RBC: 3.42 MIL/uL — ABNORMAL LOW (ref 4.22–5.81)
RDW: 17.5 % — ABNORMAL HIGH (ref 11.5–15.5)
WBC: 5.5 10*3/uL (ref 4.0–10.5)
nRBC: 0 % (ref 0.0–0.2)

## 2020-07-17 LAB — PROTIME-INR
INR: 1.5 — ABNORMAL HIGH (ref 0.8–1.2)
Prothrombin Time: 18.1 seconds — ABNORMAL HIGH (ref 11.4–15.2)

## 2020-07-17 LAB — BASIC METABOLIC PANEL
Anion gap: 5 (ref 5–15)
BUN: 40 mg/dL — ABNORMAL HIGH (ref 8–23)
CO2: 33 mmol/L — ABNORMAL HIGH (ref 22–32)
Calcium: 9.2 mg/dL (ref 8.9–10.3)
Chloride: 100 mmol/L (ref 98–111)
Creatinine, Ser: 2.02 mg/dL — ABNORMAL HIGH (ref 0.61–1.24)
GFR, Estimated: 31 mL/min — ABNORMAL LOW (ref >60)
Glucose, Bld: 100 mg/dL — ABNORMAL HIGH (ref 70–99)
Potassium: 4.4 mmol/L (ref 3.5–5.1)
Sodium: 138 mmol/L (ref 135–145)

## 2020-07-17 LAB — GLUCOSE, CAPILLARY
Glucose-Capillary: 109 mg/dL — ABNORMAL HIGH (ref 70–99)
Glucose-Capillary: 86 mg/dL (ref 70–99)
Glucose-Capillary: 110 mg/dL — ABNORMAL HIGH (ref 70–99)
Glucose-Capillary: 105 mg/dL — ABNORMAL HIGH (ref 70–99)

## 2020-07-17 LAB — MAGNESIUM: Magnesium: 2.2 mg/dL (ref 1.7–2.4)

## 2020-07-17 NOTE — Progress Notes (Addendum)
PROGRESS NOTE    [name]  SWF:[id number] DOB: [birth year] DOA: 07/11/2020 PCP: Midge Minium, MD   Chief Complaint  Patient presents with   Leg Swelling   Brief Narrative: 87 year old male with A. fib on Coumadin, diastolic CHF, CKD stage IIIb, HTN, T2DM chronic venous insufficiency followed at wound clinic admitted with shortness of breath progressively worsening swelling of lower extremities including scrotum, orthopnea. Patient seen in the ED and CTA chest negative for PE but bilateral pleural effusion left greater than right, also right thigh bruising CT showed hematoma 8 X3 centimeter, lower extremity duplex negative for DVT BNP 420 INR 3.8 hemoglobin 11.9 g. Penicillin antibiotics to cover for pneumonia subsequently on IV Lasix for volume overload, cardio consulted Coumadin held.  Subjective: Patient was seen in the morning.  Still looks swollen otherwise denies nausea vomiting chest pain.  Assessment & Plan:  Acute on chronic diastolic CHF exacerbation Bilateral pleural effusion due to #1 LVEF~60% dilated LA with LE edema/scrotal edema/shortness of breath/pleural effusion.  Rest of the symptoms improved except for leg swelling which is improving significantly.  We will continue on current Lasix IV at current dose.Continue fluid and salt restrictions.Pro-Cal is negative,antibiotic were discontinued, no pneumonia  AKI on CKD stage IIIb Baseline creatinine 1.4-1.5. creat at 1.8 today. Monitor  Right thigh hematoma, in the setting of supported INR, Coumadin on hold negative for duplex CT lower extremity reviewed.  Planning for DOAC upon discharge once okay with cardiology  A. fib chronic on Coumadin on hold due to hematoma cardiology planning for DOAC upon discharge.   Morbid obesity with BMI 32:Will benefit with weight loss and healthy lifestyle.  Chronic venous insufficiency:Follow up at wound care.  Essential hypertension:Cont Toprol and cont  diuresis.  Normocytic anemia:Monitor.  Type 2 diabetes mellitus: Blood sugars well controlled hemoglobin a1c normal 5.6 cont ssi  Hyperlipidemia: cont statin.    Diet Order             Diet Carb Modified Fluid consistency: Thin; Room service appropriate? Yes  Diet effective now                 Patient's Body mass index is 30.53 kg/m. DVT prophylaxis: Place and maintain sequential compression device Start: 07/12/20 0139 Code Status:   Code Status: DNR  Family Communication: plan of care discussed with patient at bedside. Self updating his wife. [place] to call. Status is: Inpatient Remains inpatient appropriate because:IV treatments appropriate due to intensity of illness or inability to take PO and Inpatient level of care appropriate due to severity of illness Dispo: The patient is from: Home              Anticipated d/c is to: Home w/ HH once transitioned to po diuretics.                Patient currently is not medically stable to d/c.   Difficult to place patient No Unresulted Labs (From admission, onward)     Start     Ordered   07/13/20 0500  Protime-INR  Daily,   R      07/12/20 1124   Unscheduled  Occult blood card to lab, stool RN will collect  As needed,   R     Question:  Specimen to be collected by:  Answer:  RN will collect   07/14/20 2202   Unscheduled  Occult blood card to lab, stool RN will collect  As needed,   R     Question:  Specimen to be collected by:  Answer:  RN will collect   07/14/20 2121           Medications reviewed: Scheduled Meds:  allopurinol  300 mg Oral Daily   docusate sodium  100 mg Oral BID   feeding supplement (GLUCERNA SHAKE)  237 mL Oral BID BM   folic acid  1 mg Oral Daily   furosemide  80 mg Intravenous BID   insulin aspart  0-5 Units Subcutaneous QHS   insulin aspart  0-9 Units Subcutaneous TID WC   metoprolol succinate  50 mg Oral Daily   simvastatin  20 mg Oral Daily   sodium chloride flush  3 mL Intravenous Q12H    Continuous Infusions:  sodium chloride     Consultants:see note  Procedures:see note Antimicrobials: Anti-infectives (From admission, onward)    Start     Dose/Rate Route Frequency Ordered Stop   07/12/20 0200  cefTRIAXone (ROCEPHIN) 2 g in sodium chloride 0.9 % 100 mL IVPB  Status:  Discontinued        2 g 200 mL/hr over 30 Minutes Intravenous Every 24 hours 07/12/20 0132 07/12/20 0750   07/12/20 0200  azithromycin (ZITHROMAX) 500 mg in sodium chloride 0.9 % 250 mL IVPB  Status:  Discontinued        500 mg 250 mL/hr over 60 Minutes Intravenous Every 24 hours 07/12/20 0132 07/12/20 0750      Culture/Microbiology No results found for: SDES, SPECREQUEST, CULT, REPTSTATUS  Other culture-see note  Objective: Vitals: Today's Vitals   07/16/20 2209 07/16/20 2300 07/17/20 0358 07/17/20 0859  BP:   136/60   Pulse:   69   Resp:   16   Temp:   (!) 97.4 F (36.3 C)   TempSrc:   Oral   SpO2:   95%   Weight:   99.3 kg   Height:      PainSc: 8  3   0-No pain    Intake/Output Summary (Last 24 hours) at 07/17/2020 0936 Last data filed at 07/17/2020 0906 Gross per 24 hour  Intake 240 ml  Output 2275 ml  Net -2035 ml    Filed Weights   07/14/20 0500 07/16/20 0558 07/17/20 0358  Weight: 104.8 kg 100.3 kg 99.3 kg   Weight change: -1 kg  Intake/Output from previous day: 07/15 0701 - 07/16 0700 In: 240 [P.O.:240] Out: 2075 [Urine:2075] Intake/Output this shift: Total I/O In: -  Out: 600 [Urine:600] Filed Weights   07/14/20 0500 07/16/20 0558 07/17/20 0358  Weight: 104.8 kg 100.3 kg 99.3 kg   Examination: General exam: AAOx 3 older than stated age, weak appearing. HEENT:Oral mucosa moist, Ear/Nose WNL grossly, dentition normal. Respiratory system: bilaterally clear breath sounds, no use of accessory muscle Cardiovascular system: S1 & S2 +, No JVD,. Gastrointestinal system: Abdomen soft, NT,ND, BS+ Nervous System:Alert, awake, moving extremities and grossly  nonfocal Extremities: lower leg edema still present,distal peripheral pulses palpable.  Skin: No rashes,no icterus. MSK: Normal muscle bulk,tone, power.   Data Reviewed: I have personally reviewed following labs and imaging studies CBC: Recent Labs  Lab 07/13/20 0437 07/14/20 0435 07/15/20 0437 07/16/20 0422 07/17/20 0504  WBC 6.4 5.7 6.2 4.8 5.5  NEUTROABS 4.3 3.7 4.1 2.9 3.5  HGB 10.4* 9.8* 9.3* 10.0* 10.6*  HCT 32.1* 31.0* 29.4* 31.3* 33.4*  MCV 94.7 97.8 96.7 97.2 97.7  PLT 271 229 245 244 884    Basic Metabolic Panel: Recent Labs  Lab 07/12/20 0427 07/13/20 0437 07/14/20  7322 07/15/20 0437 07/16/20 0422 07/17/20 0504  NA 140 139 137 137 135 138  K 3.8 4.0 4.1 3.9 3.8 4.4  CL 104 104 102 101 99 100  CO2 28 28 27 29 28  33*  GLUCOSE 106* 99 96 91 92 100*  BUN 26* 26* 29* 33* 36* 40*  CREATININE 1.54* 1.85* 1.90* 1.91* 1.85* 2.02*  CALCIUM 8.7* 8.6* 8.7* 8.7* 8.7* 9.2  MG 2.2 1.9 2.1 2.0 2.2 2.2  PHOS 2.7  --   --   --   --   --     GFR: Estimated Creatinine Clearance: 30.9 mL/min (A) (by C-G formula based on SCr of 2.02 mg/dL (H)). Liver Function Tests: Recent Labs  Lab 07/11/20 1556 07/12/20 0427  AST 22 23  ALT 11 11  ALKPHOS 93 93  BILITOT 2.4* 2.5*  PROT 6.5 6.5  ALBUMIN 2.9* 3.0*    No results for input(s): LIPASE, AMYLASE in the last 168 hours. No results for input(s): AMMONIA in the last 168 hours. Coagulation Profile: Recent Labs  Lab 07/13/20 0437 07/14/20 0435 07/15/20 0437 07/16/20 0422 07/17/20 0504  INR 3.2* 2.8* 2.2* 1.7* 1.5*    Cardiac Enzymes: No results for input(s): CKTOTAL, CKMB, CKMBINDEX, TROPONINI in the last 168 hours. BNP (last 3 results) No results for input(s): PROBNP in the last 8760 hours. HbA1C: No results for input(s): HGBA1C in the last 72 hours.  CBG: Recent Labs  Lab 07/16/20 0741 07/16/20 1148 07/16/20 1640 07/16/20 2134 07/17/20 0812  GLUCAP 87 276* 90 110* 109*    Lipid Profile: No results  for input(s): CHOL, HDL, LDLCALC, TRIG, CHOLHDL, LDLDIRECT in the last 72 hours. Thyroid Function Tests: No results for input(s): TSH, T4TOTAL, FREET4, T3FREE, THYROIDAB in the last 72 hours.  Anemia Panel: No results for input(s): VITAMINB12, FOLATE, FERRITIN, TIBC, IRON, RETICCTPCT in the last 72 hours.  Sepsis Labs: Recent Labs  Lab 07/12/20 0427 07/13/20 0437  PROCALCITON <0.10 <0.10     Recent Results (from the past 240 hour(s))  Resp Panel by RT-PCR (Flu A&B, Covid) Nasopharyngeal Swab     Status: None   Collection Time: 07/11/20  6:10 PM   Specimen: Nasopharyngeal Swab; Nasopharyngeal(NP) swabs in vial transport medium  Result Value Ref Range Status   SARS Coronavirus 2 by RT PCR NEGATIVE NEGATIVE Final    Comment: (NOTE) SARS-CoV-2 target nucleic acids are NOT DETECTED.  The SARS-CoV-2 RNA is generally detectable in upper respiratory specimens during the acute phase of infection. The lowest concentration of SARS-CoV-2 viral copies this assay can detect is 138 copies/mL. A negative result does not preclude SARS-Cov-2 infection and should not be used as the sole basis for treatment or other patient management decisions. A negative result may occur with  improper specimen collection/handling, submission of specimen other than nasopharyngeal swab, presence of viral mutation(s) within the areas targeted by this assay, and inadequate number of viral copies(<138 copies/mL). A negative result must be combined with clinical observations, patient history, and epidemiological information. The expected result is Negative.  Fact Sheet for Patients:  EntrepreneurPulse.com.au  Fact Sheet for Healthcare Providers:  IncredibleEmployment.be  This test is no t yet approved or cleared by the [place] FDA and  has been authorized for detection and/or diagnosis of SARS-CoV-2 by FDA under an Emergency Use Authorization (EUA). This EUA will remain   in effect (meaning this test can be used) for the duration of the COVID-19 declaration under Section 564(b)(1) of the Act, 21 U.S.C.section 360bbb-3(b)(1), unless  the authorization is terminated  or revoked sooner.       Influenza A by PCR NEGATIVE NEGATIVE Final   Influenza B by PCR NEGATIVE NEGATIVE Final    Comment: (NOTE) The Xpert Xpress SARS-CoV-2/FLU/RSV plus assay is intended as an aid in the diagnosis of influenza from Nasopharyngeal swab specimens and should not be used as a sole basis for treatment. Nasal washings and aspirates are unacceptable for Xpert Xpress SARS-CoV-2/FLU/RSV testing.  Fact Sheet for Patients: EntrepreneurPulse.com.au  Fact Sheet for Healthcare Providers: IncredibleEmployment.be  This test is not yet approved or cleared by the [place] FDA and has been authorized for detection and/or diagnosis of SARS-CoV-2 by FDA under an Emergency Use Authorization (EUA). This EUA will remain in effect (meaning this test can be used) for the duration of the COVID-19 declaration under Section 564(b)(1) of the Act, 21 U.S.C. section 360bbb-3(b)(1), unless the authorization is terminated or revoked.  Performed at [facility], [place] [address] [place], [place] [address]       Radiology Studies: No results found.   LOS: 6 days   Antonieta Pert, MD [facility]  07/17/2020, 9:36 AM

## 2020-07-17 NOTE — Progress Notes (Signed)
Progress Note  Patient Name: [name] Date of Encounter: 07/17/2020  [facility] Cardiologist: Sinclair Grooms, MD   Subjective   Laying fairly comfortably in bed.  Minimal shortness of breath with activity.  Improving lower extremity edema but still quite overloaded.  Inpatient Medications    Scheduled Meds:  allopurinol  300 mg Oral Daily   docusate sodium  100 mg Oral BID   feeding supplement (GLUCERNA SHAKE)  237 mL Oral BID BM   folic acid  1 mg Oral Daily   furosemide  80 mg Intravenous BID   insulin aspart  0-5 Units Subcutaneous QHS   insulin aspart  0-9 Units Subcutaneous TID WC   metoprolol succinate  50 mg Oral Daily   simvastatin  20 mg Oral Daily   sodium chloride flush  3 mL Intravenous Q12H   Continuous Infusions:  sodium chloride     PRN Meds: sodium chloride, acetaminophen **OR** acetaminophen, HYDROcodone-acetaminophen, sodium chloride flush   Vital Signs    Vitals:   07/16/20 0558 07/16/20 1330 07/16/20 2136 07/17/20 0358  BP: (!) 107/53 (!) 120/40 (!) 143/58 136/60  Pulse: 64 76 66 69  Resp: 16 18 16 16   Temp: (!) 97.4 F (36.3 C) 97.8 F (36.6 C) 97.9 F (36.6 C) (!) 97.4 F (36.3 C)  TempSrc: Oral Oral Oral Oral  SpO2: 95% 98% 100% 95%  Weight: 100.3 kg   99.3 kg  Height:        Intake/Output Summary (Last 24 hours) at 07/17/2020 0830 Last data filed at 07/16/2020 2302 Gross per 24 hour  Intake 240 ml  Output 2075 ml  Net -1835 ml   Last 3 Weights 07/17/2020 07/16/2020 07/14/2020  Weight (lbs) 218 lb 14.7 oz 221 lb 1.9 oz 231 lb 0.7 oz  Weight (kg) 99.3 kg 100.3 kg 104.8 kg      Telemetry    Atrial fibrillation with rate control in the 60s and 70s- Personally Reviewed  ECG    No new- Personally Reviewed  Physical Exam   GEN: No acute distress.   Neck: No JVD Cardiac: Irregularly irregular, no murmurs, rubs, or gallops.  Respiratory: Clear to auscultation bilaterally. GI: Soft, nontender, non-distended  MS:  Marked 4+ bilateral lower extremity edema; No deformity. Neuro:  Nonfocal  Psych: Normal affect   Labs    High Sensitivity Troponin:   Recent Labs  Lab 07/11/20 1556 07/11/20 1749  TROPONINIHS 11 11      Chemistry Recent Labs  Lab 07/11/20 1556 07/12/20 0427 07/13/20 0437 07/15/20 0437 07/16/20 0422 07/17/20 0504  NA 137 140   < > 137 135 138  K 4.4 3.8   < > 3.9 3.8 4.4  CL 103 104   < > 101 99 100  CO2 29 28   < > 29 28 33*  GLUCOSE 97 106*   < > 91 92 100*  BUN 23 26*   < > 33* 36* 40*  CREATININE 1.74* 1.54*   < > 1.91* 1.85* 2.02*  CALCIUM 8.6* 8.7*   < > 8.7* 8.7* 9.2  PROT 6.5 6.5  --   --   --   --   ALBUMIN 2.9* 3.0*  --   --   --   --   AST 22 23  --   --   --   --   ALT 11 11  --   --   --   --   ALKPHOS 93 93  --   --   --   --  BILITOT 2.4* 2.5*  --   --   --   --   GFRNONAA 37* 43*   < > 34* 35* 31*  ANIONGAP 5 8   < > 7 8 5    < > = values in this interval not displayed.     Hematology Recent Labs  Lab 07/15/20 0437 07/16/20 0422 07/17/20 0504  WBC 6.2 4.8 5.5  RBC 3.04* 3.22* 3.42*  HGB 9.3* 10.0* 10.6*  HCT 29.4* 31.3* 33.4*  MCV 96.7 97.2 97.7  MCH 30.6 31.1 31.0  MCHC 31.6 31.9 31.7  RDW 17.5* 17.4* 17.5*  PLT 245 244 265    BNP Recent Labs  Lab 07/11/20 1556  BNP 420.8*     DDimer No results for input(s): DDIMER in the last 168 hours.   Radiology    No results found.  Cardiac Studies   Echocardiogram this admission: - EF 65% no wall motion abnormalities moderate pulmonary hypertension severe left atrial enlargement mild right atrial enlargement mild MR mild to moderate aortic insufficiency and borderline dilatation of aortic root and moderate dilatation of ascending aorta.  Patient Profile     87 y.o. male here with acute on chronic diastolic heart failure/anasarca scrotal edema, permanent atrial fibrillation chronic kidney disease.  Assessment & Plan    Acute on chronic diastolic heart failure with lower extremity  edema scrotal edema and pleural effusions - Testicular swelling possibly more a sequela of anasarca than intrinsic urologic process - EF 65% -On IV Lasix 80 BID.  I think is reasonable to continue with this given his gross fluid overload in his lower extremities.  Continue with leg elevation.  Once able, transition to p.o. Torsemide 40mg  PO QD (at home was on Torsemide 20 mg PO QD).  He was out 2 L yesterday.  We will tolerate increase in creatinine.  Permanent atrial fibrillation - On rate control strategy.  Taking Toprol 50 mg daily. - Stable.  Chronic anticoagulation - Currently on Coumadin intermittently supratherapeutic. - Consider transition to [place] such as Eliquis.  Aortic regurgitation - Mild to moderate aortic regurgitation with aortic sclerosis no stenosis.  Follow-up as outpatient.  At this point should be of no clinical significance.  Chronic kidney disease stage IIIb - Creatinine usually in the 1.9 range.  Creatinine now 2.02 up from 1.9. - He is now off of ACE inhibitor this admission.  Aortic dilatation - Ascending aorta mildly dilated 40 mm.  Continue with outpatient monitoring.  Discussed with primary team.    For questions or updates, please contact [place] Please consult [email or web address] for contact info under        Signed, Candee Furbish, MD  07/17/2020, 8:30 AM

## 2020-07-17 NOTE — Progress Notes (Signed)
PROGRESS NOTE    [name]  HBZ:[id number] DOB: 09/12/1931 DOA: 07/11/2020 PCP: Midge Minium, MD   Chief Complaint  Patient presents with   Leg Swelling   Brief Narrative: 87 year old male with A. fib on Coumadin, diastolic CHF, CKD stage IIIb, HTN, T2DM chronic venous insufficiency followed at wound clinic admitted with shortness of breath progressively worsening swelling of lower extremities including scrotum, orthopnea. Patient seen in the ED and CTA chest negative for PE but bilateral pleural effusion left greater than right, also right thigh bruising CT showed hematoma 8 X3 centimeter, lower extremity duplex negative for DVT BNP 420 INR 3.8 hemoglobin 11.9 g. Penicillin antibiotics to cover for pneumonia subsequently on IV Lasix for volume overload, cardio consulted Coumadin held.  Subjective: Seen and examined this morning.  Legs are still swollen and he would like to continue  on IV Lasix for 1 more day.  Pa Denies shortness of breath. Assessment & Plan:  Acute on chronic diastolic CHF exacerbation Bilateral pleural effusion due to #1 LVEF preserved  dilated LA with LE edema/scrotal edema/shortness of breath/pleural effusion.  Respiratory symptoms are stable.  Legs still edematous but improving, patient wants to stay 1 more day.  Keep on IV Lasix 24 hours.  Cussed with cardiologist.  Monitor intake output Daily weight.   Pro-Cal was negative,antibiotic were discontinued, ruled out pneumonia  AKI on CKD stage IIIb Baseline creatinine 1.4-1.5. creat at 2.0 today. monitor closely while on diuretics.  Will need to allow creatinine on higher side for diuresis given ongoing leg edema . Recent Labs  Lab 07/13/20 0437 07/14/20 0435 07/15/20 0437 07/16/20 0422 07/17/20 0504  BUN 26* 29* 33* 36* 40*  CREATININE 1.85* 1.90* 1.91* 1.85* 2.02*    Right thigh hematoma, in the setting of supported INR, Coumadin on hold negative for duplex CT lower extremity reviewed.   Planning for DOAC on discharge.  Hemoglobin has been stable. Recent Labs  Lab 07/13/20 0437 07/14/20 0435 07/15/20 0437 07/16/20 0422 07/17/20 0504  HGB 10.4* 9.8* 9.3* 10.0* 10.6*  HCT 32.1* 31.0* 29.4* 31.3* 33.4*    A. fib chronic on Coumadin on hold due to hematoma cardiology planning for DOAC upon discharge.   Morbid obesity with BMI 32:Will benefit with weight loss and healthy lifestyle.  Chronic venous insufficiency:Follow up at wound care.  Essential hypertension: BP controlled, cont Toprol and cont diuresis.  Normocytic anemia:Monitor.  Hemoglobin has been stable  Type 2 diabetes mellitus: Blood sugars well controlled hemoglobin a1c normal 5.6 cont ssi  Hyperlipidemia: cont statin.    Diet Order             Diet Carb Modified Fluid consistency: Thin; Room service appropriate? Yes  Diet effective now                 Patient's Body mass index is 30.53 kg/m. DVT prophylaxis: Place and maintain sequential compression device Start: 07/12/20 0139 Code Status:   Code Status: DNR  Family Communication: plan of care discussed with patient at bedside. Self updating his wife. [place] to call. Status is: Inpatient Remains inpatient appropriate because:IV treatments appropriate due to intensity of illness or inability to take PO and Inpatient level of care appropriate due to severity of illness Dispo: The patient is from: Home              Anticipated d/c is to: Home w/ HH likely tomorrow once transitioned to po diuretics.  Patient currently is not medically stable to d/c.   Difficult to place patient No Unresulted Labs (From admission, onward)     Start     Ordered   07/18/20 4540  Basic metabolic panel  Tomorrow morning,   R        07/17/20 0948   07/13/20 0500  Protime-INR  Daily,   R      07/12/20 1124   Unscheduled  Occult blood card to lab, stool RN will collect  As needed,   R     Question:  Specimen to be collected by:  Answer:  RN will collect    07/14/20 9811   Unscheduled  Occult blood card to lab, stool RN will collect  As needed,   R     Question:  Specimen to be collected by:  Answer:  RN will collect   07/14/20 2121           Medications reviewed: Scheduled Meds:  allopurinol  300 mg Oral Daily   docusate sodium  100 mg Oral BID   feeding supplement (GLUCERNA SHAKE)  237 mL Oral BID BM   folic acid  1 mg Oral Daily   furosemide  80 mg Intravenous BID   insulin aspart  0-5 Units Subcutaneous QHS   insulin aspart  0-9 Units Subcutaneous TID WC   metoprolol succinate  50 mg Oral Daily   simvastatin  20 mg Oral Daily   sodium chloride flush  3 mL Intravenous Q12H   Continuous Infusions:  sodium chloride     Consultants:see note  Procedures:see note Antimicrobials: Anti-infectives (From admission, onward)    Start     Dose/Rate Route Frequency Ordered Stop   07/12/20 0200  cefTRIAXone (ROCEPHIN) 2 g in sodium chloride 0.9 % 100 mL IVPB  Status:  Discontinued        2 g 200 mL/hr over 30 Minutes Intravenous Every 24 hours 07/12/20 0132 07/12/20 0750   07/12/20 0200  azithromycin (ZITHROMAX) 500 mg in sodium chloride 0.9 % 250 mL IVPB  Status:  Discontinued        500 mg 250 mL/hr over 60 Minutes Intravenous Every 24 hours 07/12/20 0132 07/12/20 0750      Culture/Microbiology No results found for: SDES, SPECREQUEST, CULT, REPTSTATUS  Other culture-see note  Objective: Vitals: Today's Vitals   07/16/20 2209 07/16/20 2300 07/17/20 0358 07/17/20 0859  BP:   136/60   Pulse:   69   Resp:   16   Temp:   (!) 97.4 F (36.3 C)   TempSrc:   Oral   SpO2:   95%   Weight:   99.3 kg   Height:      PainSc: 8  3   0-No pain    Intake/Output Summary (Last 24 hours) at 07/17/2020 0951 Last data filed at 07/17/2020 0906 Gross per 24 hour  Intake 240 ml  Output 2125 ml  Net -1885 ml    Filed Weights   07/14/20 0500 07/16/20 0558 07/17/20 0358  Weight: 104.8 kg 100.3 kg 99.3 kg   Weight change: -1 kg   Intake/Output from previous day: 07/15 0701 - 07/16 0700 In: 240 [P.O.:240] Out: 2075 [Urine:2075] Intake/Output this shift: Total I/O In: -  Out: 600 [Urine:600] Filed Weights   07/14/20 0500 07/16/20 0558 07/17/20 0358  Weight: 104.8 kg 100.3 kg 99.3 kg   Examination: General exam: AAOx 3 older than stated age, weak appearing. HEENT:Oral mucosa moist, Ear/Nose WNL grossly, dentition normal. Respiratory  system: bilaterally clear breath sounds, no use of accessory muscle Cardiovascular system: S1 & S2 +, No JVD,. Gastrointestinal system: Abdomen soft, NT,ND, BS+ Nervous System:Alert, awake, moving extremities and grossly nonfocal Extremities: lower leg edema still present,distal peripheral pulses palpable.  Skin: No rashes,no icterus. MSK: Normal muscle bulk,tone, power.   Data Reviewed: I have personally reviewed following labs and imaging studies CBC: Recent Labs  Lab 07/13/20 0437 07/14/20 0435 07/15/20 0437 07/16/20 0422 07/17/20 0504  WBC 6.4 5.7 6.2 4.8 5.5  NEUTROABS 4.3 3.7 4.1 2.9 3.5  HGB 10.4* 9.8* 9.3* 10.0* 10.6*  HCT 32.1* 31.0* 29.4* 31.3* 33.4*  MCV 94.7 97.8 96.7 97.2 97.7  PLT 271 229 245 244 419    Basic Metabolic Panel: Recent Labs  Lab 07/12/20 0427 07/13/20 0437 07/14/20 0435 07/15/20 0437 07/16/20 0422 07/17/20 0504  NA 140 139 137 137 135 138  K 3.8 4.0 4.1 3.9 3.8 4.4  CL 104 104 102 101 99 100  CO2 28 28 27 29 28  33*  GLUCOSE 106* 99 96 91 92 100*  BUN 26* 26* 29* 33* 36* 40*  CREATININE 1.54* 1.85* 1.90* 1.91* 1.85* 2.02*  CALCIUM 8.7* 8.6* 8.7* 8.7* 8.7* 9.2  MG 2.2 1.9 2.1 2.0 2.2 2.2  PHOS 2.7  --   --   --   --   --     GFR: Estimated Creatinine Clearance: 30.9 mL/min (A) (by C-G formula based on SCr of 2.02 mg/dL (H)). Liver Function Tests: Recent Labs  Lab 07/11/20 1556 07/12/20 0427  AST 22 23  ALT 11 11  ALKPHOS 93 93  BILITOT 2.4* 2.5*  PROT 6.5 6.5  ALBUMIN 2.9* 3.0*    No results for input(s): LIPASE,  AMYLASE in the last 168 hours. No results for input(s): AMMONIA in the last 168 hours. Coagulation Profile: Recent Labs  Lab 07/13/20 0437 07/14/20 0435 07/15/20 0437 07/16/20 0422 07/17/20 0504  INR 3.2* 2.8* 2.2* 1.7* 1.5*    Cardiac Enzymes: No results for input(s): CKTOTAL, CKMB, CKMBINDEX, TROPONINI in the last 168 hours. BNP (last 3 results) No results for input(s): PROBNP in the last 8760 hours. HbA1C: No results for input(s): HGBA1C in the last 72 hours.  CBG: Recent Labs  Lab 07/16/20 0741 07/16/20 1148 07/16/20 1640 07/16/20 2134 07/17/20 0812  GLUCAP 87 276* 90 110* 109*    Lipid Profile: No results for input(s): CHOL, HDL, LDLCALC, TRIG, CHOLHDL, LDLDIRECT in the last 72 hours. Thyroid Function Tests: No results for input(s): TSH, T4TOTAL, FREET4, T3FREE, THYROIDAB in the last 72 hours.  Anemia Panel: No results for input(s): VITAMINB12, FOLATE, FERRITIN, TIBC, IRON, RETICCTPCT in the last 72 hours.  Sepsis Labs: Recent Labs  Lab 07/12/20 0427 07/13/20 0437  PROCALCITON <0.10 <0.10     Recent Results (from the past 240 hour(s))  Resp Panel by RT-PCR (Flu A&B, Covid) Nasopharyngeal Swab     Status: None   Collection Time: 07/11/20  6:10 PM   Specimen: Nasopharyngeal Swab; Nasopharyngeal(NP) swabs in vial transport medium  Result Value Ref Range Status   SARS Coronavirus 2 by RT PCR NEGATIVE NEGATIVE Final    Comment: (NOTE) SARS-CoV-2 target nucleic acids are NOT DETECTED.  The SARS-CoV-2 RNA is generally detectable in upper respiratory specimens during the acute phase of infection. The lowest concentration of SARS-CoV-2 viral copies this assay can detect is 138 copies/mL. A negative result does not preclude SARS-Cov-2 infection and should not be used as the sole basis for treatment or other patient  management decisions. A negative result may occur with  improper specimen collection/handling, submission of specimen other than nasopharyngeal  swab, presence of viral mutation(s) within the areas targeted by this assay, and inadequate number of viral copies(<138 copies/mL). A negative result must be combined with clinical observations, patient history, and epidemiological information. The expected result is Negative.  Fact Sheet for Patients:  EntrepreneurPulse.com.au  Fact Sheet for Healthcare Providers:  IncredibleEmployment.be  This test is no t yet approved or cleared by the [place] FDA and  has been authorized for detection and/or diagnosis of SARS-CoV-2 by FDA under an Emergency Use Authorization (EUA). This EUA will remain  in effect (meaning this test can be used) for the duration of the COVID-19 declaration under Section 564(b)(1) of the Act, 21 U.S.C.section 360bbb-3(b)(1), unless the authorization is terminated  or revoked sooner.       Influenza A by PCR NEGATIVE NEGATIVE Final   Influenza B by PCR NEGATIVE NEGATIVE Final    Comment: (NOTE) The Xpert Xpress SARS-CoV-2/FLU/RSV plus assay is intended as an aid in the diagnosis of influenza from Nasopharyngeal swab specimens and should not be used as a sole basis for treatment. Nasal washings and aspirates are unacceptable for Xpert Xpress SARS-CoV-2/FLU/RSV testing.  Fact Sheet for Patients: EntrepreneurPulse.com.au  Fact Sheet for Healthcare Providers: IncredibleEmployment.be  This test is not yet approved or cleared by the [place] FDA and has been authorized for detection and/or diagnosis of SARS-CoV-2 by FDA under an Emergency Use Authorization (EUA). This EUA will remain in effect (meaning this test can be used) for the duration of the COVID-19 declaration under Section 564(b)(1) of the Act, 21 U.S.C. section 360bbb-3(b)(1), unless the authorization is terminated or revoked.  Performed at [facility], [place] [address], [place], [place] [address]     Radiology Studies: No results found.   LOS: 6 days   Antonieta Pert, MD [facility]  07/17/2020, 9:51 AM

## 2020-07-18 LAB — BASIC METABOLIC PANEL
Anion gap: 10 (ref 5–15)
BUN: 42 mg/dL — ABNORMAL HIGH (ref 8–23)
CO2: 32 mmol/L (ref 22–32)
Calcium: 9.6 mg/dL (ref 8.9–10.3)
Chloride: 97 mmol/L — ABNORMAL LOW (ref 98–111)
Creatinine, Ser: 1.72 mg/dL — ABNORMAL HIGH (ref 0.61–1.24)
GFR, Estimated: 38 mL/min — ABNORMAL LOW (ref >60)
Glucose, Bld: 101 mg/dL — ABNORMAL HIGH (ref 70–99)
Potassium: 4.1 mmol/L (ref 3.5–5.1)
Sodium: 139 mmol/L (ref 135–145)

## 2020-07-18 LAB — GLUCOSE, CAPILLARY
Glucose-Capillary: 98 mg/dL (ref 70–99)
Glucose-Capillary: 118 mg/dL — ABNORMAL HIGH (ref 70–99)
Glucose-Capillary: 103 mg/dL — ABNORMAL HIGH (ref 70–99)
Glucose-Capillary: 117 mg/dL — ABNORMAL HIGH (ref 70–99)

## 2020-07-18 LAB — PROTIME-INR
INR: 1.4 — ABNORMAL HIGH (ref 0.8–1.2)
Prothrombin Time: 16.9 seconds — ABNORMAL HIGH (ref 11.4–15.2)

## 2020-07-18 MED ORDER — HYOSCYAMINE SULFATE 0.125 MG/ML PO SOLN
0.2500 mg | Freq: Four times a day (QID) | ORAL | Status: DC | PRN
  Administered 2020-07-20: 0.25 mg via ORAL
  Filled 2020-07-18 (×2): qty 2

## 2020-07-18 MED ORDER — GUAIFENESIN-DM 100-10 MG/5ML PO SYRP
5.0000 mL | ORAL_SOLUTION | ORAL | Status: DC | PRN
  Administered 2020-07-18 – 2020-07-20 (×3): 5 mL via ORAL
  Filled 2020-07-18 (×3): qty 10

## 2020-07-18 MED ORDER — TRAZODONE HCL 50 MG PO TABS
50.0000 mg | ORAL_TABLET | Freq: Once | ORAL | Status: AC
  Administered 2020-07-18: 50 mg via ORAL
  Filled 2020-07-18: qty 1

## 2020-07-18 NOTE — Progress Notes (Signed)
Progress Note  Patient Name: [name] Date of Encounter: 07/18/2020  [facility] Cardiologist: Sinclair Grooms, MD   Subjective   Sleeping but easily arousable.  He still feels like he is got more fluid to go.  Clearly quite edematous in his lower extremities but improved.  Inpatient Medications    Scheduled Meds:  allopurinol  300 mg Oral Daily   docusate sodium  100 mg Oral BID   feeding supplement (GLUCERNA SHAKE)  237 mL Oral BID BM   folic acid  1 mg Oral Daily   furosemide  80 mg Intravenous BID   insulin aspart  0-5 Units Subcutaneous QHS   insulin aspart  0-9 Units Subcutaneous TID WC   metoprolol succinate  50 mg Oral Daily   simvastatin  20 mg Oral Daily   sodium chloride flush  3 mL Intravenous Q12H   Continuous Infusions:  sodium chloride     PRN Meds: sodium chloride, acetaminophen **OR** acetaminophen, guaiFENesin-dextromethorphan, HYDROcodone-acetaminophen, sodium chloride flush   Vital Signs    Vitals:   07/17/20 1341 07/17/20 2020 07/18/20 0500 07/18/20 0539  BP: (!) 134/50 132/62  (!) 128/51  Pulse: 65 66  75  Resp: 15 20  20   Temp: (!) 97.5 F (36.4 C) 98 F (36.7 C)  98.1 F (36.7 C)  TempSrc: Oral Oral  Oral  SpO2: 98% (!) 89%  95%  Weight:   99.3 kg   Height:        Intake/Output Summary (Last 24 hours) at 07/18/2020 0906 Last data filed at 07/18/2020 0819 Gross per 24 hour  Intake 840 ml  Output 2325 ml  Net -1485 ml   Last 3 Weights 07/18/2020 07/17/2020 07/16/2020  Weight (lbs) 218 lb 14.7 oz 218 lb 14.7 oz 221 lb 1.9 oz  Weight (kg) 99.3 kg 99.3 kg 100.3 kg      Telemetry    Atrial fibrillation with rate control in the 60s and 70s- Personally Reviewed  ECG    No new- Personally Reviewed  Physical Exam   GEN: No acute distress.   Neck: No JVD Cardiac: Normal rate irregularly irregular, no murmurs, rubs, or gallops.  Respiratory: Clear to auscultation bilaterally. GI: Soft, nontender, non-distended  MS: 3+  bilateral lower extremity edema; No deformity. Neuro:  Nonfocal  Psych: Normal affect   Labs    High Sensitivity Troponin:   Recent Labs  Lab 07/11/20 1556 07/11/20 1749  TROPONINIHS 11 11      Chemistry Recent Labs  Lab 07/11/20 1556 07/12/20 0427 07/13/20 0437 07/16/20 0422 07/17/20 0504 07/18/20 0516  NA 137 140   < > 135 138 139  K 4.4 3.8   < > 3.8 4.4 4.1  CL 103 104   < > 99 100 97*  CO2 29 28   < > 28 33* 32  GLUCOSE 97 106*   < > 92 100* 101*  BUN 23 26*   < > 36* 40* 42*  CREATININE 1.74* 1.54*   < > 1.85* 2.02* 1.72*  CALCIUM 8.6* 8.7*   < > 8.7* 9.2 9.6  PROT 6.5 6.5  --   --   --   --   ALBUMIN 2.9* 3.0*  --   --   --   --   AST 22 23  --   --   --   --   ALT 11 11  --   --   --   --   [facility]  93 93  --   --   --   --   BILITOT 2.4* 2.5*  --   --   --   --   GFRNONAA 37* 43*   < > 35* 31* 38*  ANIONGAP 5 8   < > 8 5 10    < > = values in this interval not displayed.     Hematology Recent Labs  Lab 07/15/20 0437 07/16/20 0422 07/17/20 0504  WBC 6.2 4.8 5.5  RBC 3.04* 3.22* 3.42*  HGB 9.3* 10.0* 10.6*  HCT 29.4* 31.3* 33.4*  MCV 96.7 97.2 97.7  MCH 30.6 31.1 31.0  MCHC 31.6 31.9 31.7  RDW 17.5* 17.4* 17.5*  PLT 245 244 265    BNP Recent Labs  Lab 07/11/20 1556  BNP 420.8*     DDimer No results for input(s): DDIMER in the last 168 hours.   Radiology    No results found.  Cardiac Studies   Echocardiogram this admission: - EF 65% no wall motion abnormalities moderate pulmonary hypertension severe left atrial enlargement mild right atrial enlargement mild MR mild to moderate aortic insufficiency and borderline dilatation of aortic root and moderate dilatation of ascending aorta.  Patient Profile     87 y.o. male here with acute on chronic diastolic heart failure/anasarca scrotal edema, permanent atrial fibrillation chronic kidney disease.  Assessment & Plan    Acute on chronic diastolic heart failure with lower extremity edema  scrotal edema and pleural effusions - Testicular swelling possibly more a sequela of anasarca than intrinsic urologic process - EF 65% -On IV Lasix 80 BID.  I think is reasonable to continue with this given his gross fluid overload in his lower extremities.  No changes made today.  Continue with leg elevation.  Once able, transition to p.o. Torsemide 40mg  PO QD (at home was on Torsemide 20 mg PO QD).  He was out 2.7 L yesterday.  We will tolerate increase in creatinine.  Today his creatinine decreased from 2.02-1.72 after diuresis.  BUN is increased to 42.  Permanent atrial fibrillation - On rate control strategy.  Taking Toprol 50 mg daily. - No changes made with Toprol.  Chronic anticoagulation - Delighted with transfer to Eliquis.  Working with case management.  Aortic regurgitation - Mild to moderate aortic regurgitation with aortic sclerosis no stenosis.  Follow-up as outpatient.  At this point should be of no clinical significance.  No changes made.  Chronic kidney disease stage IIIb - Creatinine usually in the 1.9 range.  Slightly decreased today from 2 down to 1.7.  He is off ACE inhibitor's on this admission.  Aortic dilatation - Ascending aorta mildly dilated 40 mm.  Continue with outpatient monitoring.  No changes.   For questions or updates, please contact [place] Please consult [email or web address] for contact info under        Signed, Candee Furbish, MD  07/18/2020, 9:06 AM

## 2020-07-18 NOTE — Progress Notes (Signed)
PROGRESS NOTE    [name]  TFT:[id number] DOB: 1931-10-13 DOA: 07/11/2020 PCP: Midge Minium, MD   Chief Complaint  Patient presents with   Leg Swelling   Brief Narrative: 87 year old male with A. fib on Coumadin, diastolic CHF, CKD stage IIIb, HTN, T2DM chronic venous insufficiency followed at wound clinic admitted with shortness of breath progressively worsening swelling of lower extremities including scrotum, orthopnea. Patient seen in the ED and CTA chest negative for PE but bilateral pleural effusion left greater than right, also right thigh bruising CT showed hematoma 8 X3 centimeter, lower extremity duplex negative for DVT BNP 420 INR 3.8 hemoglobin 11.9 g. Penicillin antibiotics to cover for pneumonia subsequently on IV Lasix for volume overload, cardio consulted Coumadin held. Patient continued on IV diuresis with lower leg edema significantly improving.  Subjective: Has no complaint but still leg are edematous No chest pain nausea vomiting shortness of breath.  Assessment & Plan:  Acute on chronic diastolic CHF exacerbation Bilateral pleural effusion due to #1 LVEF preserved  dilated LA with LE edema/scrotal edema/shortness of breath/pleural effusion.  Respiratory status is stable however still has significant lower leg edema cardiology following closely and will continue on IV diuresis.  Had good urine output 2800 yesterday so far -9.8 L ,weight at 218. On admission 233 lb.  Pro-Cal was negative,antibiotic were discontinued, ruled out pneumonia  AKI on CKD stage IIIb Baseline creatinine 1.4-1.5. creat was at 2.0> 1.7, responding with improvement on diuresis.  Monitor closely. Recent Labs  Lab 07/14/20 0435 07/15/20 0437 07/16/20 0422 07/17/20 0504 07/18/20 0516  BUN 29* 33* 36* 40* 42*  CREATININE 1.90* 1.91* 1.85* 2.02* 1.72*   Right thigh hematoma, in the setting of supra-therapeutic INR, Coumadin on hold, negative for duplex CT lower extremity  reviewed.  Planning for DOAC on discharge.  Hemoglobin has been stable. Recent Labs  Lab 07/13/20 0437 07/14/20 0435 07/15/20 0437 07/16/20 0422 07/17/20 0504  HGB 10.4* 9.8* 9.3* 10.0* 10.6*  HCT 32.1* 31.0* 29.4* 31.3* 33.4*     A. fib chronic on Coumadin on hold due to hematoma.  Cardiology planning to start DOAC/Eliquis upon discharge. CM checked price.   Morbid obesity with BMI 32:will  benefit with weight loss and healthy lifestyle.  Chronic venous insufficiency:Follow up at wound care.  Continue diuresis as above  Essential hypertension:  BP control continue Toprol and cont diuresis.  Normocytic anemia:Monitor.  Hemoglobin has been stable  Type 2 diabetes mellitus: Blood sugars well controlled hemoglobin a1c normal 5.6 cont ssi  Hyperlipidemia: cont statin.    Diet Order             Diet Carb Modified Fluid consistency: Thin; Room service appropriate? Yes  Diet effective now                 Patient's Body mass index is 30.53 kg/m. DVT prophylaxis: Place and maintain sequential compression device Start: 07/12/20 0139 Code Status:   Code Status: DNR  Family Communication: plan of care discussed with patient at bedside. Self updating his wife. [place] to call. Status is: Inpatient Remains inpatient appropriate because:IV treatments appropriate due to intensity of illness or inability to take PO and Inpatient level of care appropriate due to severity of illness Dispo: The patient is from: Home              Anticipated d/c is to: Home w/ HH likely tomorrow once transitioned to po diuretics.  Patient currently is not medically stable to d/c.   Difficult to place patient No Unresulted Labs (From admission, onward)     Start     Ordered   07/13/20 0500  Protime-INR  Daily,   R      07/12/20 1124   Unscheduled  Occult blood card to lab, stool RN will collect  As needed,   R     Question:  Specimen to be collected by:  Answer:  RN will collect    07/14/20 9390   Unscheduled  Occult blood card to lab, stool RN will collect  As needed,   R     Question:  Specimen to be collected by:  Answer:  RN will collect   07/14/20 2121           Medications reviewed: Scheduled Meds:  allopurinol  300 mg Oral Daily   docusate sodium  100 mg Oral BID   feeding supplement (GLUCERNA SHAKE)  237 mL Oral BID BM   folic acid  1 mg Oral Daily   furosemide  80 mg Intravenous BID   insulin aspart  0-5 Units Subcutaneous QHS   insulin aspart  0-9 Units Subcutaneous TID WC   metoprolol succinate  50 mg Oral Daily   simvastatin  20 mg Oral Daily   sodium chloride flush  3 mL Intravenous Q12H   Continuous Infusions:  sodium chloride     Consultants:see note  Procedures:see note Antimicrobials: Anti-infectives (From admission, onward)    Start     Dose/Rate Route Frequency Ordered Stop   07/12/20 0200  cefTRIAXone (ROCEPHIN) 2 g in sodium chloride 0.9 % 100 mL IVPB  Status:  Discontinued        2 g 200 mL/hr over 30 Minutes Intravenous Every 24 hours 07/12/20 0132 07/12/20 0750   07/12/20 0200  azithromycin (ZITHROMAX) 500 mg in sodium chloride 0.9 % 250 mL IVPB  Status:  Discontinued        500 mg 250 mL/hr over 60 Minutes Intravenous Every 24 hours 07/12/20 0132 07/12/20 0750      Culture/Microbiology No results found for: SDES, SPECREQUEST, CULT, REPTSTATUS  Other culture-see note  Objective: Vitals: Today's Vitals   07/18/20 0500 07/18/20 0539 07/18/20 0901 07/18/20 0952  BP:  (!) 128/51  (!) 121/53  Pulse:  75  73  Resp:  20    Temp:  98.1 F (36.7 C)    TempSrc:  Oral    SpO2:  95%    Weight: 99.3 kg     Height:      PainSc:   0-No pain     Intake/Output Summary (Last 24 hours) at 07/18/2020 1036 Last data filed at 07/18/2020 0930 Gross per 24 hour  Intake 840 ml  Output 2200 ml  Net -1360 ml    Filed Weights   07/16/20 0558 07/17/20 0358 07/18/20 0500  Weight: 100.3 kg 99.3 kg 99.3 kg   Weight change: 0 kg   Intake/Output from previous day: 07/16 0701 - 07/17 0700 In: 720 [P.O.:720] Out: 2800 [Urine:2800] Intake/Output this shift: Total I/O In: 360 [P.O.:360] Out: 300 [Urine:300] Filed Weights   07/16/20 0558 07/17/20 0358 07/18/20 0500  Weight: 100.3 kg 99.3 kg 99.3 kg   Examination: General exam: AAOx 3, obese,elderly, pleasant HEENT:Oral mucosa moist, Ear/Nose WNL grossly, dentition normal. Respiratory system: bilaterally diminished, no use of accessory muscle Cardiovascular system: S1 & S2 +, No JVD,. Gastrointestinal system: Abdomen soft,NT,ND, BS+ Nervous System:Alert, awake, moving extremities and grossly  nonfocal Extremities:  LE edematous pitting++ Skin: No rashes,no icterus. MSK: Normal muscle bulk,tone, power   Data Reviewed: I have personally reviewed following labs and imaging studies CBC: Recent Labs  Lab 07/13/20 0437 07/14/20 0435 07/15/20 0437 07/16/20 0422 07/17/20 0504  WBC 6.4 5.7 6.2 4.8 5.5  NEUTROABS 4.3 3.7 4.1 2.9 3.5  HGB 10.4* 9.8* 9.3* 10.0* 10.6*  HCT 32.1* 31.0* 29.4* 31.3* 33.4*  MCV 94.7 97.8 96.7 97.2 97.7  PLT 271 229 245 244 500    Basic Metabolic Panel: Recent Labs  Lab 07/12/20 0427 07/13/20 0437 07/14/20 0435 07/15/20 0437 07/16/20 0422 07/17/20 0504 07/18/20 0516  NA 140 139 137 137 135 138 139  K 3.8 4.0 4.1 3.9 3.8 4.4 4.1  CL 104 104 102 101 99 100 97*  CO2 28 28 27 29 28  33* 32  GLUCOSE 106* 99 96 91 92 100* 101*  BUN 26* 26* 29* 33* 36* 40* 42*  CREATININE 1.54* 1.85* 1.90* 1.91* 1.85* 2.02* 1.72*  CALCIUM 8.7* 8.6* 8.7* 8.7* 8.7* 9.2 9.6  MG 2.2 1.9 2.1 2.0 2.2 2.2  --   PHOS 2.7  --   --   --   --   --   --     GFR: Estimated Creatinine Clearance: 36.3 mL/min (A) (by C-G formula based on SCr of 1.72 mg/dL (H)). Liver Function Tests: Recent Labs  Lab 07/11/20 1556 07/12/20 0427  AST 22 23  ALT 11 11  ALKPHOS 93 93  BILITOT 2.4* 2.5*  PROT 6.5 6.5  ALBUMIN 2.9* 3.0*    No results for input(s):  LIPASE, AMYLASE in the last 168 hours. No results for input(s): AMMONIA in the last 168 hours. Coagulation Profile: Recent Labs  Lab 07/14/20 0435 07/15/20 0437 07/16/20 0422 07/17/20 0504 07/18/20 0516  INR 2.8* 2.2* 1.7* 1.5* 1.4*    Cardiac Enzymes: No results for input(s): CKTOTAL, CKMB, CKMBINDEX, TROPONINI in the last 168 hours. BNP (last 3 results) No results for input(s): PROBNP in the last 8760 hours. HbA1C: No results for input(s): HGBA1C in the last 72 hours.  CBG: Recent Labs  Lab 07/17/20 0812 07/17/20 1133 07/17/20 1700 07/17/20 2103 07/18/20 0730  GLUCAP 109* 86 110* 105* 98    Lipid Profile: No results for input(s): CHOL, HDL, LDLCALC, TRIG, CHOLHDL, LDLDIRECT in the last 72 hours. Thyroid Function Tests: No results for input(s): TSH, T4TOTAL, FREET4, T3FREE, THYROIDAB in the last 72 hours.  Anemia Panel: No results for input(s): VITAMINB12, FOLATE, FERRITIN, TIBC, IRON, RETICCTPCT in the last 72 hours.  Sepsis Labs: Recent Labs  Lab 07/12/20 0427 07/13/20 0437  PROCALCITON <0.10 <0.10     Recent Results (from the past 240 hour(s))  Resp Panel by RT-PCR (Flu A&B, Covid) Nasopharyngeal Swab     Status: None   Collection Time: 07/11/20  6:10 PM   Specimen: Nasopharyngeal Swab; Nasopharyngeal(NP) swabs in vial transport medium  Result Value Ref Range Status   SARS Coronavirus 2 by RT PCR NEGATIVE NEGATIVE Final    Comment: (NOTE) SARS-CoV-2 target nucleic acids are NOT DETECTED.  The SARS-CoV-2 RNA is generally detectable in upper respiratory specimens during the acute phase of infection. The lowest concentration of SARS-CoV-2 viral copies this assay can detect is 138 copies/mL. A negative result does not preclude SARS-Cov-2 infection and should not be used as the sole basis for treatment or other patient management decisions. A negative result may occur with  improper specimen collection/handling, submission of specimen other than  nasopharyngeal swab,  presence of viral mutation(s) within the areas targeted by this assay, and inadequate number of viral copies(<138 copies/mL). A negative result must be combined with clinical observations, patient history, and epidemiological information. The expected result is Negative.  Fact Sheet for Patients:  EntrepreneurPulse.com.au  Fact Sheet for Healthcare Providers:  IncredibleEmployment.be  This test is no t yet approved or cleared by the [place] FDA and  has been authorized for detection and/or diagnosis of SARS-CoV-2 by FDA under an Emergency Use Authorization (EUA). This EUA will remain  in effect (meaning this test can be used) for the duration of the COVID-19 declaration under Section 564(b)(1) of the Act, 21 U.S.C.section 360bbb-3(b)(1), unless the authorization is terminated  or revoked sooner.       Influenza A by PCR NEGATIVE NEGATIVE Final   Influenza B by PCR NEGATIVE NEGATIVE Final    Comment: (NOTE) The Xpert Xpress SARS-CoV-2/FLU/RSV plus assay is intended as an aid in the diagnosis of influenza from Nasopharyngeal swab specimens and should not be used as a sole basis for treatment. Nasal washings and aspirates are unacceptable for Xpert Xpress SARS-CoV-2/FLU/RSV testing.  Fact Sheet for Patients: EntrepreneurPulse.com.au  Fact Sheet for Healthcare Providers: IncredibleEmployment.be  This test is not yet approved or cleared by the [place] FDA and has been authorized for detection and/or diagnosis of SARS-CoV-2 by FDA under an Emergency Use Authorization (EUA). This EUA will remain in effect (meaning this test can be used) for the duration of the COVID-19 declaration under Section 564(b)(1) of the Act, 21 U.S.C. section 360bbb-3(b)(1), unless the authorization is terminated or revoked.  Performed at [facility], [place] [address] [place], [place] [address]    Radiology Studies: No results found.   LOS: 7 days   Antonieta Pert, MD [facility]  07/18/2020, 10:36 AM

## 2020-07-18 NOTE — Plan of Care (Signed)
  Problem: Clinical Measurements: Goal: Cardiovascular complication will be avoided Outcome: Progressing   Problem: Activity: Goal: Risk for activity intolerance will decrease Outcome: Progressing   Problem: Safety: Goal: Ability to remain free from injury will improve Outcome: Progressing   Problem: Activity: Goal: Capacity to carry out activities will improve Outcome: Progressing   Problem: Education: Goal: Ability to demonstrate management of disease process will improve Outcome: Adequate for Discharge

## 2020-07-19 LAB — GLUCOSE, CAPILLARY
Glucose-Capillary: 90 mg/dL (ref 70–99)
Glucose-Capillary: 101 mg/dL — ABNORMAL HIGH (ref 70–99)
Glucose-Capillary: 92 mg/dL (ref 70–99)
Glucose-Capillary: 117 mg/dL — ABNORMAL HIGH (ref 70–99)

## 2020-07-19 LAB — BASIC METABOLIC PANEL
Anion gap: 7 (ref 5–15)
BUN: 40 mg/dL — ABNORMAL HIGH (ref 8–23)
CO2: 30 mmol/L (ref 22–32)
Calcium: 9 mg/dL (ref 8.9–10.3)
Chloride: 99 mmol/L (ref 98–111)
Creatinine, Ser: 1.89 mg/dL — ABNORMAL HIGH (ref 0.61–1.24)
GFR, Estimated: 34 mL/min — ABNORMAL LOW (ref >60)
Glucose, Bld: 93 mg/dL (ref 70–99)
Potassium: 3.7 mmol/L (ref 3.5–5.1)
Sodium: 136 mmol/L (ref 135–145)

## 2020-07-19 LAB — PROTIME-INR
INR: 1.4 — ABNORMAL HIGH (ref 0.8–1.2)
Prothrombin Time: 16.7 seconds — ABNORMAL HIGH (ref 11.4–15.2)

## 2020-07-19 NOTE — Progress Notes (Signed)
Physical Therapy Treatment Patient Details Name: [name] MRN: [id number] DOB: 1931-01-11 Today's Date: 07/19/2020    History of Present Illness 87 yo male admitted with CHF, bil LE edema, pleural effusion, fall at home recently. Hx of CHF, DM, A fib, CKD, obesity, venous insufficiency, falls.    PT Comments    Assisted OOB to bathroom then amb in hallway all at Supervision level.  Pt has met his Acute PT goals and would benefit from "Mobility Team" during his stay here.  Pt plans to return home with his spouse.    Follow Up Recommendations  Home health PT;Supervision/Assistance - 24 hour     Equipment Recommendations  Rolling walker with 5" wheels    Recommendations for Other Services       Precautions / Restrictions Precautions Precautions: Fall Precaution Comments: B LE edema Restrictions Weight Bearing Restrictions: No    Mobility  Bed Mobility Overal bed mobility: Modified Independent       Supine to sit: Modified independent (Device/Increase time);HOB elevated     General bed mobility comments: self able with increased time and use of rail    Transfers Overall transfer level: Needs assistance Equipment used: None Transfers: Sit to/from [facility] Sit to Stand: Supervision Stand pivot transfers: Supervision       General transfer comment: close Supervision and good use of B UE's to steady self.  Ambulation/Gait Ambulation/Gait assistance: Supervision Gait Distance (Feet): 180 Feet Assistive device: Rolling walker (2 wheeled) Gait Pattern/deviations: Step-through pattern;Wide base of support Gait velocity: decreased   General Gait Details: did have x 1 LOB with turning while using walker.  VC's to "stay within walker" all time.  Pt has agreed to use walker in comminity and his walking stick/furniture in home.   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      Exercises      General Comments        Pertinent Vitals/Pain Pain Assessment: No/denies pain    Home Living                      Prior Function            PT Goals (current goals can now be found in the care plan section) Progress towards PT goals:  (Goals Met - refere to mobility team)    Frequency    Min 3X/week      PT Plan Current plan remains appropriate    Co-evaluation              AM-PAC PT "6 Clicks" Mobility   Outcome Measure  Help needed turning from your back to your side while in a flat bed without using bedrails?: A Little  Help needed moving from lying on your back to sitting on the side of a flat bed without using bedrails?: A Little Help needed moving to and from a bed to a chair (including a wheelchair)?: A Little Help needed standing up from a chair using your arms (e.g., wheelchair or bedside chair)?: A Little Help needed to walk in hospital room?: A Little Help needed climbing 3-5 steps with a railing? : A Little 6 Click Score: 18    End of Session Equipment Utilized During Treatment: Gait belt Activity Tolerance: Patient tolerated treatment well Patient left: in chair;with call bell/phone within reach;with chair alarm set Nurse Communication: Mobility status PT Visit Diagnosis: Muscle weakness (generalized) (M62.81);History of falling (Z91.81);Unsteadiness on feet (R26.81)     Time: 0375-4360 PT Time Calculation (min) (ACUTE ONLY): 24 min  Charges:  $Gait Training: 8-22 mins $Therapeutic Activity: 8-22 mins                     Rica Koyanagi  PTA Acute  Rehabilitation Services Pager       [phone] Office      [phone]

## 2020-07-19 NOTE — Progress Notes (Signed)
PROGRESS NOTE    [name]  VZD:[id number] DOB: 28-Aug-1931 DOA: 07/11/2020 PCP: Midge Minium, MD   Chief Complaint  Patient presents with   Leg Swelling   Brief Narrative: 87 year old male with A. fib on Coumadin, diastolic CHF, CKD stage IIIb, HTN, T2DM chronic venous insufficiency followed at wound clinic admitted with shortness of breath progressively worsening swelling of lower extremities including scrotum, orthopnea. Patient seen in the ED and CTA chest negative for PE but bilateral pleural effusion left greater than right, also right thigh bruising CT showed hematoma 8 X3 centimeter, lower extremity duplex negative for DVT BNP 420 INR 3.8 hemoglobin 11.9 g. Penicillin antibiotics to cover for pneumonia subsequently on IV Lasix for volume overload, cardio consulted Coumadin held. Patient continued on IV diuresis with lower leg edema significantly improving.  Subjective: Seen and examined this morning.  Resting comfortably still has edematous leg  Denies respiratory issues.  Assessment & Plan:  Acute on chronic diastolic CHF exacerbation Bilateral pleural effusion due to #1 LVEF preserved  dilated LA with LE edema/scrotal edema/shortness of breath/pleural effusion.  No shortness of breath.  Has extensive lower leg edema and improving nicely, will benefit with ongoing IV Lasix .  Hopefully we can transition to oral torsemide next day, await for cardiology recommendation.,weight down 233 >> 218> 214.   Pro-Cal was negative,antibiotic were discontinued, ruled out pneumonia  AKI on CKD stage IIIb Baseline creatinine 1.4-1.5.  Creatinine has been stable 1.7- 2, tolerating lasix Recent Labs  Lab 07/15/20 0437 07/16/20 0422 07/17/20 0504 07/18/20 0516 07/19/20 0704  BUN 33* 36* 40* 42* 40*  CREATININE 1.91* 1.85* 2.02* 1.72* 1.89*   Right thigh hematoma, in the setting of supra-therapeutic INR, Coumadin on hold, negative for duplex CT lower extremity reviewed.   Planning for DOAC on discharge.  Hemoglobin has been stable. Recent Labs  Lab 07/13/20 0437 07/14/20 0435 07/15/20 0437 07/16/20 0422 07/17/20 0504  HGB 10.4* 9.8* 9.3* 10.0* 10.6*  HCT 32.1* 31.0* 29.4* 31.3* 33.4*   A. fib chronic on Coumadin on hold due to hematoma.  Cardiology planning to start DOAC/Eliquis upon discharge. Coupon arranged.   Morbid obesity with BMI 32:he will benefit with weight loss and healthy lifestyle.  Chronic venous insufficiency:Follow up at wound care.  He has compression stocking at home, will order here too  Essential hypertension:  BP is well controlled on Toprol.  On Lasix.   Normocytic anemia:Monitor.  Hemoglobin has been stable  Type 2 diabetes mellitus: sugar is controlled, hemoglobin a1c normal 5.6 cont ssi  Hyperlipidemia: cont statin.    Diet Order             Diet Carb Modified Fluid consistency: Thin; Room service appropriate? Yes  Diet effective now                 Patient's Body mass index is 29.89 kg/m. DVT prophylaxis: Place and maintain sequential compression device Start: 07/12/20 0139 Code Status:   Code Status: DNR  Family Communication: plan of care discussed with patient at bedside. Self updating his wife. [place] to call. Status is: Inpatient Remains inpatient appropriate because:IV treatments appropriate due to intensity of illness or inability to take PO and Inpatient level of care appropriate due to severity of illness Dispo: The patient is from: Home              Anticipated d/c is to: Home w/ HH likely tomorrow  Patient currently is not medically stable to d/c.   Difficult to place patient No Unresulted Labs (From admission, onward)     Start     Ordered   07/20/20 4132  Basic metabolic panel  Daily,   R     Question:  Specimen collection method  Answer:  Lab=Lab collect   07/19/20 0650   07/13/20 0500  Protime-INR  Daily,   R      07/12/20 1124   Unscheduled  Occult blood card to lab, stool  RN will collect  As needed,   R     Question:  Specimen to be collected by:  Answer:  RN will collect   07/14/20 4401   Unscheduled  Occult blood card to lab, stool RN will collect  As needed,   R     Question:  Specimen to be collected by:  Answer:  RN will collect   07/14/20 2121           Medications reviewed: Scheduled Meds:  allopurinol  300 mg Oral Daily   docusate sodium  100 mg Oral BID   feeding supplement (GLUCERNA SHAKE)  237 mL Oral BID BM   folic acid  1 mg Oral Daily   furosemide  80 mg Intravenous BID   insulin aspart  0-5 Units Subcutaneous QHS   insulin aspart  0-9 Units Subcutaneous TID WC   metoprolol succinate  50 mg Oral Daily   simvastatin  20 mg Oral Daily   sodium chloride flush  3 mL Intravenous Q12H   Continuous Infusions:  sodium chloride     Consultants:see note  Procedures:see note Antimicrobials: Anti-infectives (From admission, onward)    Start     Dose/Rate Route Frequency Ordered Stop   07/12/20 0200  cefTRIAXone (ROCEPHIN) 2 g in sodium chloride 0.9 % 100 mL IVPB  Status:  Discontinued        2 g 200 mL/hr over 30 Minutes Intravenous Every 24 hours 07/12/20 0132 07/12/20 0750   07/12/20 0200  azithromycin (ZITHROMAX) 500 mg in sodium chloride 0.9 % 250 mL IVPB  Status:  Discontinued        500 mg 250 mL/hr over 60 Minutes Intravenous Every 24 hours 07/12/20 0132 07/12/20 0750      Culture/Microbiology No results found for: SDES, SPECREQUEST, CULT, REPTSTATUS  Other culture-see note  Objective: Vitals: Today's Vitals   07/19/20 0500 07/19/20 0522 07/19/20 1028 07/19/20 1049  BP:  (!) 122/55 (!) 107/46   Pulse:  76 70   Resp:  20    Temp:  98.7 F (37.1 C)    TempSrc:  Oral    SpO2:  94%    Weight: 97.2 kg     Height:      PainSc:    0-No pain    Intake/Output Summary (Last 24 hours) at 07/19/2020 1317 Last data filed at 07/19/2020 0900 Gross per 24 hour  Intake 180 ml  Output 800 ml  Net -620 ml    Filed Weights    07/17/20 0358 07/18/20 0500 07/19/20 0500  Weight: 99.3 kg 99.3 kg 97.2 kg   Weight change: -2.1 kg  Intake/Output from previous day: 07/17 0701 - 07/18 0700 In: 420 [P.O.:420] Out: 1525 [Urine:1525] Intake/Output this shift: Total I/O In: 120 [P.O.:120] Out: -  Filed Weights   07/17/20 0358 07/18/20 0500 07/19/20 0500  Weight: 99.3 kg 99.3 kg 97.2 kg   Examination: General exam: AAOx 3 older than stated age, weak appearing. HEENT:Oral mucosa  moist, Ear/Nose WNL grossly, dentition normal. Respiratory system: bilaterally clear no added sound, no use of accessory muscle Cardiovascular system: S1 & S2 +, No JVD,. Gastrointestinal system: Abdomen soft, NT,ND, BS+ Nervous System:Alert, awake, moving extremities and grossly nonfocal Extremities: LE extensive edema- much better, distal peripheral pulses palpable.  Skin: No rashes,no icterus. MSK: Normal muscle bulk,tone, power   Data Reviewed: I have personally reviewed following labs and imaging studies CBC: Recent Labs  Lab 07/13/20 0437 07/14/20 0435 07/15/20 0437 07/16/20 0422 07/17/20 0504  WBC 6.4 5.7 6.2 4.8 5.5  NEUTROABS 4.3 3.7 4.1 2.9 3.5  HGB 10.4* 9.8* 9.3* 10.0* 10.6*  HCT 32.1* 31.0* 29.4* 31.3* 33.4*  MCV 94.7 97.8 96.7 97.2 97.7  PLT 271 229 245 244 253    Basic Metabolic Panel: Recent Labs  Lab 07/13/20 0437 07/14/20 0435 07/15/20 0437 07/16/20 0422 07/17/20 0504 07/18/20 0516 07/19/20 0704  NA 139 137 137 135 138 139 136  K 4.0 4.1 3.9 3.8 4.4 4.1 3.7  CL 104 102 101 99 100 97* 99  CO2 28 27 29 28  33* 32 30  GLUCOSE 99 96 91 92 100* 101* 93  BUN 26* 29* 33* 36* 40* 42* 40*  CREATININE 1.85* 1.90* 1.91* 1.85* 2.02* 1.72* 1.89*  CALCIUM 8.6* 8.7* 8.7* 8.7* 9.2 9.6 9.0  MG 1.9 2.1 2.0 2.2 2.2  --   --     GFR: Estimated Creatinine Clearance: 32.8 mL/min (A) (by C-G formula based on SCr of 1.89 mg/dL (H)). Liver Function Tests: No results for input(s): AST, ALT, ALKPHOS, BILITOT, PROT,  ALBUMIN in the last 168 hours.  No results for input(s): LIPASE, AMYLASE in the last 168 hours. No results for input(s): AMMONIA in the last 168 hours. Coagulation Profile: Recent Labs  Lab 07/15/20 0437 07/16/20 0422 07/17/20 0504 07/18/20 0516 07/19/20 0408  INR 2.2* 1.7* 1.5* 1.4* 1.4*    Cardiac Enzymes: No results for input(s): CKTOTAL, CKMB, CKMBINDEX, TROPONINI in the last 168 hours. BNP (last 3 results) No results for input(s): PROBNP in the last 8760 hours. HbA1C: No results for input(s): HGBA1C in the last 72 hours.  CBG: Recent Labs  Lab 07/18/20 1129 07/18/20 1631 07/18/20 2123 07/19/20 0739 07/19/20 1224  GLUCAP 118* 103* 117* 90 101*    Lipid Profile: No results for input(s): CHOL, HDL, LDLCALC, TRIG, CHOLHDL, LDLDIRECT in the last 72 hours. Thyroid Function Tests: No results for input(s): TSH, T4TOTAL, FREET4, T3FREE, THYROIDAB in the last 72 hours.  Anemia Panel: No results for input(s): VITAMINB12, FOLATE, FERRITIN, TIBC, IRON, RETICCTPCT in the last 72 hours.  Sepsis Labs: Recent Labs  Lab 07/13/20 0437  PROCALCITON <0.10     Recent Results (from the past 240 hour(s))  Resp Panel by RT-PCR (Flu A&B, Covid) Nasopharyngeal Swab     Status: None   Collection Time: 07/11/20  6:10 PM   Specimen: Nasopharyngeal Swab; Nasopharyngeal(NP) swabs in vial transport medium  Result Value Ref Range Status   SARS Coronavirus 2 by RT PCR NEGATIVE NEGATIVE Final    Comment: (NOTE) SARS-CoV-2 target nucleic acids are NOT DETECTED.  The SARS-CoV-2 RNA is generally detectable in upper respiratory specimens during the acute phase of infection. The lowest concentration of SARS-CoV-2 viral copies this assay can detect is 138 copies/mL. A negative result does not preclude SARS-Cov-2 infection and should not be used as the sole basis for treatment or other patient management decisions. A negative result may occur with  improper specimen collection/handling,  submission of specimen  other than nasopharyngeal swab, presence of viral mutation(s) within the areas targeted by this assay, and inadequate number of viral copies(<138 copies/mL). A negative result must be combined with clinical observations, patient history, and epidemiological information. The expected result is Negative.  Fact Sheet for Patients:  EntrepreneurPulse.com.au  Fact Sheet for Healthcare Providers:  IncredibleEmployment.be  This test is no t yet approved or cleared by the [place] FDA and  has been authorized for detection and/or diagnosis of SARS-CoV-2 by FDA under an Emergency Use Authorization (EUA). This EUA will remain  in effect (meaning this test can be used) for the duration of the COVID-19 declaration under Section 564(b)(1) of the Act, 21 U.S.C.section 360bbb-3(b)(1), unless the authorization is terminated  or revoked sooner.       Influenza A by PCR NEGATIVE NEGATIVE Final   Influenza B by PCR NEGATIVE NEGATIVE Final    Comment: (NOTE) The Xpert Xpress SARS-CoV-2/FLU/RSV plus assay is intended as an aid in the diagnosis of influenza from Nasopharyngeal swab specimens and should not be used as a sole basis for treatment. Nasal washings and aspirates are unacceptable for Xpert Xpress SARS-CoV-2/FLU/RSV testing.  Fact Sheet for Patients: EntrepreneurPulse.com.au  Fact Sheet for Healthcare Providers: IncredibleEmployment.be  This test is not yet approved or cleared by the [place] FDA and has been authorized for detection and/or diagnosis of SARS-CoV-2 by FDA under an Emergency Use Authorization (EUA). This EUA will remain in effect (meaning this test can be used) for the duration of the COVID-19 declaration under Section 564(b)(1) of the Act, 21 U.S.C. section 360bbb-3(b)(1), unless the authorization is terminated or revoked.  Performed at [facility], [place] [address], [place], [place] [address]    Radiology Studies: No results found.   LOS: 8 days   Antonieta Pert, MD [facility]  07/19/2020, 1:17 PM

## 2020-07-19 NOTE — Plan of Care (Signed)
  Problem: Clinical Measurements: Goal: Diagnostic test results will improve Outcome: Progressing Goal: Cardiovascular complication will be avoided Outcome: Progressing   Problem: Activity: Goal: Risk for activity intolerance will decrease Outcome: Progressing   

## 2020-07-19 NOTE — Care Management Important Message (Signed)
Important Message  Patient Details IM Letter given to the Patient. Name: [name] MRN: [id number] Date of Birth: Mar 12, 1931   Medicare Important Message Given:  Yes     Kerin Salen 07/19/2020, 11:56 AM

## 2020-07-19 NOTE — TOC Progression Note (Signed)
Transition of Care [facility]) - Progression Note    Patient Details  Name: [name] MRN: [id number] Date of Birth: 10-12-1931  [facility] CM/SW Contact  Ross Ludwig, [place] Phone Number: 07/19/2020, 5:50 PM  Clinical Narrative:    Currently active with [facility], plan to discharge back home once medically ready.  Equipment has been delivered.   Expected Discharge Plan: [place] Barriers to Discharge: Continued Medical Work up  Expected Discharge Plan and Services Expected Discharge Plan: [place]   Discharge Planning Services: CM Consult   Living arrangements for the past 2 months: Single Family Home                 DME Arranged: Tub bench, Walker rolling DME Agency: [employer] Date DME Agency Contacted: 07/13/20 Time DME Agency Contacted: [phone] Representative spoke with at DME Agency: [facility] ([place]) Interventions    Readmission Risk Interventions Readmission Risk Prevention Plan 07/12/2020  Transportation Screening Complete  PCP or Specialist Appt within 5-7 Days Complete  Home Care Screening Complete  Medication Review (RN CM) Complete  Some recent data might be hidden

## 2020-07-19 NOTE — Progress Notes (Signed)
Progress Note  Patient Name: [name] Date of Encounter: 07/19/2020  [facility] Cardiologist: Sinclair Grooms, MD   Subjective   Patient is a limited historian. He states he has been swelling for years before he came to seek help. He felt his breathing and swelling are overall improving. He is still winded when getting out of bed. He is urinating well, denied any chest pain, lightheadedness.   Inpatient Medications    Scheduled Meds:  allopurinol  300 mg Oral Daily   docusate sodium  100 mg Oral BID   feeding supplement (GLUCERNA SHAKE)  237 mL Oral BID BM   folic acid  1 mg Oral Daily   furosemide  80 mg Intravenous BID   insulin aspart  0-5 Units Subcutaneous QHS   insulin aspart  0-9 Units Subcutaneous TID WC   metoprolol succinate  50 mg Oral Daily   simvastatin  20 mg Oral Daily   sodium chloride flush  3 mL Intravenous Q12H   Continuous Infusions:  sodium chloride     PRN Meds: sodium chloride, acetaminophen **OR** acetaminophen, guaiFENesin-dextromethorphan, HYDROcodone-acetaminophen, hyoscyamine, sodium chloride flush   Vital Signs    Vitals:   07/18/20 0952 07/18/20 1208 07/19/20 0500 07/19/20 0522  BP: (!) 121/53 139/60  (!) 122/55  Pulse: 73 75  76  Resp:  18  20  Temp:  98.4 F (36.9 C)  98.7 F (37.1 C)  TempSrc:    Oral  SpO2:  95%  94%  Weight:   97.2 kg   Height:        Intake/Output Summary (Last 24 hours) at 07/19/2020 0836 Last data filed at 07/19/2020 0600 Gross per 24 hour  Intake 60 ml  Output 1400 ml  Net -1340 ml   Last 3 Weights 07/19/2020 07/18/2020 07/17/2020  Weight (lbs) 214 lb 4.6 oz 218 lb 14.7 oz 218 lb 14.7 oz  Weight (kg) 97.2 kg 99.3 kg 99.3 kg      Telemetry    A fib with ventricular rate of 70s - Personally Reviewed  ECG    N/A this AM - Personally Reviewed  Physical Exam   GEN: No acute distress.   Neck: JVD 9-10cm while HOB 15 degree  Cardiac: RRR, no murmurs, rubs, or gallops. On room air.   Respiratory: Clear to auscultation bilaterally. GI: Soft, nontender, non-distended  MS: BLE 2+ pitting edema, scrotum and thigh edema improved; No deformity. Neuro:  Nonfocal  Psych: Normal affect   Labs    High Sensitivity Troponin:   Recent Labs  Lab 07/11/20 1556 07/11/20 1749  TROPONINIHS 11 11      Chemistry Recent Labs  Lab 07/17/20 0504 07/18/20 0516 07/19/20 0704  NA 138 139 136  K 4.4 4.1 3.7  CL 100 97* 99  CO2 33* 32 30  GLUCOSE 100* 101* 93  BUN 40* 42* 40*  CREATININE 2.02* 1.72* 1.89*  CALCIUM 9.2 9.6 9.0  GFRNONAA 31* 38* 34*  ANIONGAP 5 10 7      Hematology Recent Labs  Lab 07/15/20 0437 07/16/20 0422 07/17/20 0504  WBC 6.2 4.8 5.5  RBC 3.04* 3.22* 3.42*  HGB 9.3* 10.0* 10.6*  HCT 29.4* 31.3* 33.4*  MCV 96.7 97.2 97.7  MCH 30.6 31.1 31.0  MCHC 31.6 31.9 31.7  RDW 17.5* 17.4* 17.5*  PLT 245 244 265    BNPNo results for input(s): BNP, PROBNP in the last 168 hours.   DDimer No results for input(s): DDIMER in the  last 168 hours.   Radiology    No results found.  Cardiac Studies   Echo from 07/12/20:   1. Left ventricular ejection fraction, by estimation, is 60 to 65%. The  left ventricle has normal function. The left ventricle has no regional  wall motion abnormalities. Diastolic function indeterminant due to Afib.   2. Right ventricular systolic function is normal. The right ventricular  size is normal. There is moderately elevated pulmonary artery systolic  pressure. The estimated right ventricular systolic pressure is 68.1 mmHg.   3. Left atrial size was severely dilated.   4. Right atrial size was mildly dilated.   5. Moderate pleural effusion in the left lateral region.   6. The mitral valve is grossly normal. Mild mitral valve regurgitation.   7. The aortic valve is tricuspid. There is mild calcification of the  aortic valve. There is moderate thickening of the aortic valve. Aortic  valve regurgitation is mild to moderate.  Mild to moderate aortic valve  sclerosis/calcification is present,  without any evidence of aortic stenosis.   8. Aortic dilatation noted. There is borderline dilatation of the aortic  root, measuring 37 mm. There is moderate dilatation of the ascending  aorta, measuring 40 mm.   9. The inferior vena cava is dilated in size with <50% respiratory  variability, suggesting right atrial pressure of 15 mmHg.   Comparison(s): Compared to prior TTE in 11/2018, a left sided moderate  pleural effusion is now present.   Patient Profile     87 y.o. male with PMH of chronic diastolic heart failure, permanent atrial fibrillation, CKD IIIb, moderate AI, who presented to ER on 07/11/20 for worsening SOB and LE edema with testicular swelling. Cardiology is following for decompensated diastolic heart failure.   Assessment & Plan    Acute on chronic diastolic heart failure - presented with worsening SOB and anasarca  - BNP 420 - CTA with bilateral pleural effusion L>R with associated consildation - Echo from 07/12/20 showed EF 60-65%, no RWMA, moderate elevated PSAP with RVSP 49.1 mmHg,  severe LAE, mild RAE, moderate left pleural effusion, mild MR, mild-moderate AR, borderline dilation of aortic root and moderate dilation of ascending aorta - Net -11L since admission, UOP 1525 ml over the past 24 hours - Weight is down from 233 >214 ibs since admission - Serum creatinine overall stable with IV diuresis  - continue IV Lasix 80mg  BID until euvolemia, plan transition to PO torsemide 40mg  daily at DC - continue medical therapy with metoprolol XL 50mg  daily   Anasarca  - appears multifactorial from CHF, hypoalbuminemia, chronic venous insufficiency ? Cirrhosis  - managed per IM   Permanent A fib - rate controlled on metoprolol - on Coumadin prior to this admission, currently held due to right thigh hematoma 8x3 cm on CT, plan to transition to Eliquis when medically cleared by IM, CM to assist cost issues    HTN - BP controlled on metoprolol and lasix   HLD - continue simvastatin  CKD IIIb - renal index near baseline (appears 1.6-1.9 ranges)  - 1.74 POA,  1.89 today,  trend BMP daily  - discontinued lisinopril   Aortic regurgitation Aortic dilatation  - moderate AI, ascending aorta mildly dilated 40 mm on on Echo, follow outpatient   Right thigh hematoma Type 2 DM Anemia Chronic venous insufficiency - managed per IM    For questions or updates, please contact [facility] Please consult [email or web address] for contact info under  Signed, Margie Billet, NP  07/19/2020, 8:36 AM

## 2020-07-20 LAB — BASIC METABOLIC PANEL
Anion gap: 11 (ref 5–15)
BUN: 37 mg/dL — ABNORMAL HIGH (ref 8–23)
CO2: 31 mmol/L (ref 22–32)
Calcium: 9.1 mg/dL (ref 8.9–10.3)
Chloride: 96 mmol/L — ABNORMAL LOW (ref 98–111)
Creatinine, Ser: 1.78 mg/dL — ABNORMAL HIGH (ref 0.61–1.24)
GFR, Estimated: 36 mL/min — ABNORMAL LOW (ref >60)
Glucose, Bld: 104 mg/dL — ABNORMAL HIGH (ref 70–99)
Potassium: 3.7 mmol/L (ref 3.5–5.1)
Sodium: 138 mmol/L (ref 135–145)

## 2020-07-20 LAB — GLUCOSE, CAPILLARY
Glucose-Capillary: 94 mg/dL (ref 70–99)
Glucose-Capillary: 120 mg/dL — ABNORMAL HIGH (ref 70–99)
Glucose-Capillary: 95 mg/dL (ref 70–99)
Glucose-Capillary: 111 mg/dL — ABNORMAL HIGH (ref 70–99)

## 2020-07-20 MED ORDER — TORSEMIDE 20 MG PO TABS
40.0000 mg | ORAL_TABLET | Freq: Two times a day (BID) | ORAL | Status: DC
  Administered 2020-07-20 – 2020-07-21 (×2): 40 mg via ORAL
  Filled 2020-07-20 (×3): qty 2

## 2020-07-20 NOTE — Plan of Care (Signed)
  Problem: Clinical Measurements: Goal: Cardiovascular complication will be avoided Outcome: Progressing   Problem: Activity: Goal: Risk for activity intolerance will decrease Outcome: Progressing   Problem: Safety: Goal: Ability to remain free from injury will improve Outcome: Progressing   Problem: Education: Goal: Ability to demonstrate management of disease process will improve Outcome: Progressing   Problem: Activity: Goal: Capacity to carry out activities will improve Outcome: Progressing

## 2020-07-20 NOTE — Progress Notes (Signed)
Mobility Specialist - Progress Note     07/20/20 1549  Mobility  Activity Ambulated in hall  Level of Assistance Standby assist, set-up cues, supervision of patient - no hands on  Assistive Device Front wheel walker  Distance Ambulated (ft) 300 ft  Mobility Ambulated with assistance in hallway  Mobility Response Tolerated well  Mobility performed by Mobility specialist  $Mobility charge 1 Mobility     Pt ambulated 300 ft in hallway using RW. Pt did not c/o of pain or dizziness, but SOB was present during ambulation. Verbal cues utilized to keep pt ambulating in center of hallway and avoid drifting toward rails. Pt tolerated session well and returned to bed after ambulation. Pt left with call bell at side.   [place] Specialist Acute Rehabilitation Services Phone: [phone] 07/20/20, 3:52 PM

## 2020-07-20 NOTE — Progress Notes (Signed)
PROGRESS NOTE    [name]  PTW:[id number] DOB: 12-10-1931 DOA: 07/11/2020 PCP: Midge Minium, MD   Chief Complaint  Patient presents with   Leg Swelling   Brief Narrative: 87 year old male with A. fib on Coumadin, diastolic CHF, CKD stage IIIb, HTN, T2DM chronic venous insufficiency followed at wound clinic admitted with shortness of breath progressively worsening swelling of lower extremities including scrotum, orthopnea. Patient seen in the ED and CTA chest negative for PE but bilateral pleural effusion left greater than right, also right thigh bruising CT showed hematoma 8 X3 centimeter, lower extremity duplex negative for DVT BNP 420 INR 3.8 hemoglobin 11.9 g. Penicillin antibiotics to cover for pneumonia subsequently on IV Lasix for volume overload, cardio consulted Coumadin held. Patient continued on IV diuresis with lower leg edema significantly improving.  Subjective: Seen this morning resting comfortably still has edematous leg.  No other new complaints.    Assessment & Plan:  Acute on chronic diastolic CHF exacerbation Bilateral pleural effusion due to #1 LVEF preserved  dilated LA with LE edema/scrotal edema/shortness of breath/pleural effusion.  No shortness of breath.  Has extensive lower leg edema and seems to be improving.  Discussed with cardiology plan to transition to torsemide 40 mg twice daily and see the response monitor overnight. Weight down 233 >> 218> 214.   Pro-Cal was negative,antibiotic were discontinued, ruled out pneumonia  AKI on CKD stage IIIb Baseline creatinine 1.4-1.5.  Creatinine in the range of 1.7- 2, monitor closely while on diuretics. Recent Labs  Lab 07/16/20 0422 07/17/20 0504 07/18/20 0516 07/19/20 0704 07/20/20 0420  BUN 36* 40* 42* 40* 37*  CREATININE 1.85* 2.02* 1.72* 1.89* 1.78*   Right thigh hematoma, in the setting of supra-therapeutic INR, Coumadin on hold, negative for duplex CT lower extremity reviewed.  Planning  for DOAC on discharge.  Stable hemoglobin. Recent Labs  Lab 07/14/20 0435 07/15/20 0437 07/16/20 0422 07/17/20 0504  HGB 9.8* 9.3* 10.0* 10.6*  HCT 31.0* 29.4* 31.3* 33.4*   A. fib chronic on Coumadin on hold due to hematoma.  Cardiology following closely.  Cardiology planning to start DOAC/Eliquis upon discharge. Coupon arranged.   Morbid obesity with BMI 32:he will benefit with weight loss and healthy lifestyle.  Chronic venous insufficiency: He will need to continue following up with wound care.  Refused compression stocking.    Essential hypertension:  BP well controlled continue  Toprol and diuretics.  Normocytic anemia:Monitor.  Hemoglobin has been stable  Type 2 diabetes mellitus: Controlled. hemoglobin a1c normal 5.6 cont ssi  Hyperlipidemia: cont statin.    Diet Order             Diet Carb Modified Fluid consistency: Thin; Room service appropriate? Yes  Diet effective now                 Patient's Body mass index is 29.98 kg/m. DVT prophylaxis: Place and maintain sequential compression device Start: 07/12/20 0139 Code Status:   Code Status: DNR  Family Communication: plan of care discussed with patient at bedside. Self updating his wife. Availale to call. Status is: Inpatient Remains inpatient appropriate because:IV treatments appropriate due to intensity of illness or inability to take PO and Inpatient level of care appropriate due to severity of illness Dispo: The patient is from: Home              Anticipated d/c is to: Home w/ HH likely tomorrow if having good diuresis on torsemide  Patient currently is not medically stable to d/c.   Difficult to place patient No Unresulted Labs (From admission, onward)     Start     Ordered   07/20/20 0623  Basic metabolic panel  Daily,   R     Question:  Specimen collection method  Answer:  Lab=Lab collect   07/19/20 0650           Medications reviewed: Scheduled Meds:  allopurinol  300 mg Oral  Daily   docusate sodium  100 mg Oral BID   feeding supplement (GLUCERNA SHAKE)  237 mL Oral BID BM   folic acid  1 mg Oral Daily   insulin aspart  0-5 Units Subcutaneous QHS   insulin aspart  0-9 Units Subcutaneous TID WC   metoprolol succinate  50 mg Oral Daily   simvastatin  20 mg Oral Daily   sodium chloride flush  3 mL Intravenous Q12H   torsemide  40 mg Oral BID   Continuous Infusions:  sodium chloride     Consultants:see note  Procedures:see note Antimicrobials: Anti-infectives (From admission, onward)    Start     Dose/Rate Route Frequency Ordered Stop   07/12/20 0200  cefTRIAXone (ROCEPHIN) 2 g in sodium chloride 0.9 % 100 mL IVPB  Status:  Discontinued        2 g 200 mL/hr over 30 Minutes Intravenous Every 24 hours 07/12/20 0132 07/12/20 0750   07/12/20 0200  azithromycin (ZITHROMAX) 500 mg in sodium chloride 0.9 % 250 mL IVPB  Status:  Discontinued        500 mg 250 mL/hr over 60 Minutes Intravenous Every 24 hours 07/12/20 0132 07/12/20 0750      Culture/Microbiology No results found for: SDES, SPECREQUEST, CULT, REPTSTATUS  Other culture-see note  Objective: Vitals: Today's Vitals   07/19/20 2145 07/19/20 2339 07/20/20 0544 07/20/20 0850  BP: (!) 130/55  (!) 140/55   Pulse: 68  70   Resp: 20  18   Temp: 98.2 F (36.8 C)  97.9 F (36.6 C)   TempSrc: Oral  Oral   SpO2: 96%  92%   Weight:   97.5 kg   Height:      PainSc:  0-No pain  0-No pain    Intake/Output Summary (Last 24 hours) at 07/20/2020 1052 Last data filed at 07/20/2020 0926 Gross per 24 hour  Intake 180 ml  Output 1925 ml  Net -1745 ml    Filed Weights   07/18/20 0500 07/19/20 0500 07/20/20 0544  Weight: 99.3 kg 97.2 kg 97.5 kg   Weight change: 0.3 kg  Intake/Output from previous day: 07/18 0701 - 07/19 0700 In: 300 [P.O.:300] Out: 1300 [Urine:1300] Intake/Output this shift: Total I/O In: -  Out: 625 [Urine:625] Filed Weights   07/18/20 0500 07/19/20 0500 07/20/20 0544   Weight: 99.3 kg 97.2 kg 97.5 kg   Examination: General exam: AAOx 3 older than stated age, weak appearing. HEENT:Oral mucosa moist, Ear/Nose WNL grossly, dentition normal. Respiratory system: bilaterally clear breath sounds, no use of accessory muscle Cardiovascular system: S1 & S2 +, No JVD,. Gastrointestinal system: Abdomen soft, NT,ND, BS+ Nervous System:Alert, awake, moving extremities and grossly nonfocal Extremities: Lower leg edema pitting, distal peripheral pulses palpable.  Skin: No rashes,no icterus. MSK: Normal muscle bulk,tone, power   Data Reviewed: I have personally reviewed following labs and imaging studies CBC: Recent Labs  Lab 07/14/20 0435 07/15/20 0437 07/16/20 0422 07/17/20 0504  WBC 5.7 6.2 4.8 5.5  NEUTROABS 3.7 4.1  2.9 3.5  HGB 9.8* 9.3* 10.0* 10.6*  HCT 31.0* 29.4* 31.3* 33.4*  MCV 97.8 96.7 97.2 97.7  PLT 229 245 244 595    Basic Metabolic Panel: Recent Labs  Lab 07/14/20 0435 07/15/20 0437 07/16/20 0422 07/17/20 0504 07/18/20 0516 07/19/20 0704 07/20/20 0420  NA 137 137 135 138 139 136 138  K 4.1 3.9 3.8 4.4 4.1 3.7 3.7  CL 102 101 99 100 97* 99 96*  CO2 27 29 28  33* 32 30 31  GLUCOSE 96 91 92 100* 101* 93 104*  BUN 29* 33* 36* 40* 42* 40* 37*  CREATININE 1.90* 1.91* 1.85* 2.02* 1.72* 1.89* 1.78*  CALCIUM 8.7* 8.7* 8.7* 9.2 9.6 9.0 9.1  MG 2.1 2.0 2.2 2.2  --   --   --     GFR: Estimated Creatinine Clearance: 34.8 mL/min (A) (by C-G formula based on SCr of 1.78 mg/dL (H)). Liver Function Tests: No results for input(s): AST, ALT, ALKPHOS, BILITOT, PROT, ALBUMIN in the last 168 hours.  No results for input(s): LIPASE, AMYLASE in the last 168 hours. No results for input(s): AMMONIA in the last 168 hours. Coagulation Profile: Recent Labs  Lab 07/15/20 0437 07/16/20 0422 07/17/20 0504 07/18/20 0516 07/19/20 0408  INR 2.2* 1.7* 1.5* 1.4* 1.4*    Cardiac Enzymes: No results for input(s): CKTOTAL, CKMB, CKMBINDEX, TROPONINI in  the last 168 hours. BNP (last 3 results) No results for input(s): PROBNP in the last 8760 hours. HbA1C: No results for input(s): HGBA1C in the last 72 hours.  CBG: Recent Labs  Lab 07/19/20 0739 07/19/20 1224 07/19/20 1642 07/19/20 2208 07/20/20 0738  GLUCAP 90 101* 92 117* 94    Lipid Profile: No results for input(s): CHOL, HDL, LDLCALC, TRIG, CHOLHDL, LDLDIRECT in the last 72 hours. Thyroid Function Tests: No results for input(s): TSH, T4TOTAL, FREET4, T3FREE, THYROIDAB in the last 72 hours.  Anemia Panel: No results for input(s): VITAMINB12, FOLATE, FERRITIN, TIBC, IRON, RETICCTPCT in the last 72 hours.  Sepsis Labs: No results for input(s): PROCALCITON, LATICACIDVEN in the last 168 hours.   Recent Results (from the past 240 hour(s))  Resp Panel by RT-PCR (Flu A&B, Covid) Nasopharyngeal Swab     Status: None   Collection Time: 07/11/20  6:10 PM   Specimen: Nasopharyngeal Swab; Nasopharyngeal(NP) swabs in vial transport medium  Result Value Ref Range Status   SARS Coronavirus 2 by RT PCR NEGATIVE NEGATIVE Final    Comment: (NOTE) SARS-CoV-2 target nucleic acids are NOT DETECTED.  The SARS-CoV-2 RNA is generally detectable in upper respiratory specimens during the acute phase of infection. The lowest concentration of SARS-CoV-2 viral copies this assay can detect is 138 copies/mL. A negative result does not preclude SARS-Cov-2 infection and should not be used as the sole basis for treatment or other patient management decisions. A negative result may occur with  improper specimen collection/handling, submission of specimen other than nasopharyngeal swab, presence of viral mutation(s) within the areas targeted by this assay, and inadequate number of viral copies(<138 copies/mL). A negative result must be combined with clinical observations, patient history, and epidemiological information. The expected result is Negative.  Fact Sheet for Patients:   EntrepreneurPulse.com.au  Fact Sheet for Healthcare Providers:  IncredibleEmployment.be  This test is no t yet approved or cleared by the [place] FDA and  has been authorized for detection and/or diagnosis of SARS-CoV-2 by FDA under an Emergency Use Authorization (EUA). This EUA will remain  in effect (meaning this test can be  used) for the duration of the COVID-19 declaration under Section 564(b)(1) of the Act, 21 U.S.C.section 360bbb-3(b)(1), unless the authorization is terminated  or revoked sooner.       Influenza A by PCR NEGATIVE NEGATIVE Final   Influenza B by PCR NEGATIVE NEGATIVE Final    Comment: (NOTE) The Xpert Xpress SARS-CoV-2/FLU/RSV plus assay is intended as an aid in the diagnosis of influenza from Nasopharyngeal swab specimens and should not be used as a sole basis for treatment. Nasal washings and aspirates are unacceptable for Xpert Xpress SARS-CoV-2/FLU/RSV testing.  Fact Sheet for Patients: EntrepreneurPulse.com.au  Fact Sheet for Healthcare Providers: IncredibleEmployment.be  This test is not yet approved or cleared by the [place] FDA and has been authorized for detection and/or diagnosis of SARS-CoV-2 by FDA under an Emergency Use Authorization (EUA). This EUA will remain in effect (meaning this test can be used) for the duration of the COVID-19 declaration under Section 564(b)(1) of the Act, 21 U.S.C. section 360bbb-3(b)(1), unless the authorization is terminated or revoked.  Performed at [facility], [place] [address] [place], [place] [address]    Radiology Studies: No results found.   LOS: 9 days   Antonieta Pert, MD [facility]  07/20/2020, 10:52 AM

## 2020-07-20 NOTE — Progress Notes (Addendum)
Progress Note  Patient Name: [name] Date of Encounter: 07/20/2020  [facility] Cardiologist: Sinclair Grooms, MD   Subjective   Patient states he is doing better, was able to walk some distance yesterday, did not get winded. He notes his BLE swelling are improving. He denied chest pain and SOB.   Inpatient Medications    Scheduled Meds:  allopurinol  300 mg Oral Daily   docusate sodium  100 mg Oral BID   feeding supplement (GLUCERNA SHAKE)  237 mL Oral BID BM   folic acid  1 mg Oral Daily   insulin aspart  0-5 Units Subcutaneous QHS   insulin aspart  0-9 Units Subcutaneous TID WC   metoprolol succinate  50 mg Oral Daily   simvastatin  20 mg Oral Daily   sodium chloride flush  3 mL Intravenous Q12H   torsemide  40 mg Oral BID   Continuous Infusions:  sodium chloride     PRN Meds: sodium chloride, acetaminophen **OR** acetaminophen, guaiFENesin-dextromethorphan, HYDROcodone-acetaminophen, hyoscyamine, sodium chloride flush   Vital Signs    Vitals:   07/19/20 1028 07/19/20 1455 07/19/20 2145 07/20/20 0544  BP: (!) 107/46 (!) 127/49 (!) 130/55 (!) 140/55  Pulse: 70 71 68 70  Resp:  18 20 18   Temp:  98.3 F (36.8 C) 98.2 F (36.8 C) 97.9 F (36.6 C)  TempSrc:  Oral Oral Oral  SpO2:  96% 96% 92%  Weight:    97.5 kg  Height:        Intake/Output Summary (Last 24 hours) at 07/20/2020 0953 Last data filed at 07/20/2020 0926 Gross per 24 hour  Intake 180 ml  Output 1925 ml  Net -1745 ml   Last 3 Weights 07/20/2020 07/19/2020 07/18/2020  Weight (lbs) 214 lb 15.2 oz 214 lb 4.6 oz 218 lb 14.7 oz  Weight (kg) 97.5 kg 97.2 kg 99.3 kg      Telemetry    A fib with ventricular rate of 60s - Personally Reviewed  ECG    N/A this AM - Personally Reviewed  Physical Exam   GEN: No acute distress.   Neck: JVD 8cm with HOB at 10 degree Cardiac: RRR, no murmurs, rubs, or gallops.   Respiratory: Clear to auscultation bilaterally. On room air. Speaks full  sentence  GI: Soft, nontender, non-distended  MS: BLE 2+ pitting edema, scrotum and thigh edema resolved; No deformity. Neuro:  Nonfocal  Psych: Normal affect   Labs    High Sensitivity Troponin:   Recent Labs  Lab 07/11/20 1556 07/11/20 1749  TROPONINIHS 11 11      Chemistry Recent Labs  Lab 07/18/20 0516 07/19/20 0704 07/20/20 0420  NA 139 136 138  K 4.1 3.7 3.7  CL 97* 99 96*  CO2 32 30 31  GLUCOSE 101* 93 104*  BUN 42* 40* 37*  CREATININE 1.72* 1.89* 1.78*  CALCIUM 9.6 9.0 9.1  GFRNONAA 38* 34* 36*  ANIONGAP 10 7 11      Hematology Recent Labs  Lab 07/15/20 0437 07/16/20 0422 07/17/20 0504  WBC 6.2 4.8 5.5  RBC 3.04* 3.22* 3.42*  HGB 9.3* 10.0* 10.6*  HCT 29.4* 31.3* 33.4*  MCV 96.7 97.2 97.7  MCH 30.6 31.1 31.0  MCHC 31.6 31.9 31.7  RDW 17.5* 17.4* 17.5*  PLT 245 244 265    BNPNo results for input(s): BNP, PROBNP in the last 168 hours.   DDimer No results for input(s): DDIMER in the last 168 hours.   Radiology  No results found.  Cardiac Studies   Echo from 07/12/20:   1. Left ventricular ejection fraction, by estimation, is 60 to 65%. The  left ventricle has normal function. The left ventricle has no regional  wall motion abnormalities. Diastolic function indeterminant due to Afib.   2. Right ventricular systolic function is normal. The right ventricular  size is normal. There is moderately elevated pulmonary artery systolic  pressure. The estimated right ventricular systolic pressure is 96.2 mmHg.   3. Left atrial size was severely dilated.   4. Right atrial size was mildly dilated.   5. Moderate pleural effusion in the left lateral region.   6. The mitral valve is grossly normal. Mild mitral valve regurgitation.   7. The aortic valve is tricuspid. There is mild calcification of the  aortic valve. There is moderate thickening of the aortic valve. Aortic  valve regurgitation is mild to moderate. Mild to moderate aortic valve   sclerosis/calcification is present,  without any evidence of aortic stenosis.   8. Aortic dilatation noted. There is borderline dilatation of the aortic  root, measuring 37 mm. There is moderate dilatation of the ascending  aorta, measuring 40 mm.   9. The inferior vena cava is dilated in size with <50% respiratory  variability, suggesting right atrial pressure of 15 mmHg.   Comparison(s): Compared to prior TTE in 11/2018, a left sided moderate  pleural effusion is now present.   Patient Profile     87 y.o. male with PMH of chronic diastolic heart failure, permanent atrial fibrillation, CKD IIIb, moderate AI, who presented to ER on 07/11/20 for worsening SOB and LE edema with testicular swelling. Cardiology is following for decompensated diastolic heart failure.   Assessment & Plan    Acute on chronic diastolic heart failure - presented with worsening SOB and anasarca  - BNP 420 - CTA with bilateral pleural effusion L>R with associated consildation - Echo from 07/12/20 showed EF 60-65%, no RWMA, moderate elevated PSAP with RVSP 49.1 mmHg,  severe LAE, mild RAE, moderate left pleural effusion, mild MR, mild-moderate AR, borderline dilation of aortic root and moderate dilation of ascending aorta - Net -12L since admission, UOP 1300 ml over the past 24 hours - Weight is down from 233 >214 ibs since admission - Serum creatinine overall stable with IV diuresis  - will transition to PO torsemide 40mg  BID today and monitor response to PO diuretics, if remains adequate diuresis effect and stable renal index, can be discharged home, BLE edema will likely need time to resolve - continue medical therapy with metoprolol XL 50mg  daily  - follow up appt scheduled with cardiology on 08/10/20   Anasarca  - appears multifactorial from CHF, hypoalbuminemia, chronic venous insufficiency ? Cirrhosis  - managed per IM   Permanent A fib - rate controlled on metoprolol - on Coumadin prior to this  admission, currently held due to right thigh hematoma 8x3 cm on CT, plan to transition to Eliquis when medically cleared by IM, CM to assist cost issues  - CHA2DS2-VASc Score 5, would resume anticoagulation when able  HTN - BP controlled on metoprolol and lasix   HLD - continue simvastatin  CKD IIIb - renal index near baseline (appears 1.6-1.9 ranges)  - 1.74 POA,  1.78 today,  trend BMP daily  - discontinued lisinopril this admission   Aortic regurgitation Aortic dilatation  - moderate AI, ascending aorta mildly dilated 40 mm on on Echo, follow outpatient   Right thigh hematoma Type 2  DM Anemia Chronic venous insufficiency - managed per IM    For questions or updates, please contact [place] Please consult [email or web address] for contact info under        Signed, Margie Billet, NP  07/20/2020, 9:53 AM

## 2020-07-20 NOTE — Discharge Instructions (Addendum)
Information on my medicine - ELIQUIS (apixaban)  Why was Eliquis prescribed for you? Eliquis was prescribed for you to reduce the risk of a blood clot forming that can cause a stroke if you have a medical condition called atrial fibrillation (a type of irregular heartbeat).  What do You need to know about Eliquis ? Take your Eliquis TWICE DAILY - one tablet in the morning and one tablet in the evening with or without food. If you have difficulty swallowing the tablet whole please discuss with your pharmacist how to take the medication safely.  Take Eliquis exactly as prescribed by your doctor and DO NOT stop taking Eliquis without talking to the doctor who prescribed the medication.  Stopping may increase your risk of developing a stroke.  Refill your prescription before you run out.  After discharge, you should have regular check-up appointments with your healthcare provider that is prescribing your Eliquis.  In the future your dose may need to be changed if your kidney function or weight changes by a significant amount or as you get older.  What do you do if you miss a dose? If you miss a dose, take it as soon as you remember on the same day and resume taking twice daily.  Do not take more than one dose of ELIQUIS at the same time to make up a missed dose.  Important Safety Information A possible side effect of Eliquis is bleeding. You should call your healthcare provider right away if you experience any of the following: Bleeding from an injury or your nose that does not stop. Unusual colored urine (red or dark brown) or unusual colored stools (red or black). Unusual bruising for unknown reasons. A serious fall or if you hit your head (even if there is no bleeding).  Some medicines may interact with Eliquis and might increase your risk of bleeding or clotting while on Eliquis. To help avoid this, consult your healthcare provider or pharmacist prior to using any new prescription or  non-prescription medications, including herbals, vitamins, non-steroidal anti-inflammatory drugs (NSAIDs) and supplements.  This website has more information on Eliquis (apixaban): http://www.eliquis.com/eliquis/home Chf

## 2020-07-21 ENCOUNTER — Other Ambulatory Visit: Payer: Self-pay | Admitting: Home Health

## 2020-07-21 DIAGNOSIS — Z5189 Encounter for other specified aftercare: Secondary | ICD-10-CM

## 2020-07-21 LAB — BASIC METABOLIC PANEL
Anion gap: 10 (ref 5–15)
BUN: 38 mg/dL — ABNORMAL HIGH (ref 8–23)
CO2: 30 mmol/L (ref 22–32)
Calcium: 8.6 mg/dL — ABNORMAL LOW (ref 8.9–10.3)
Chloride: 97 mmol/L — ABNORMAL LOW (ref 98–111)
Creatinine, Ser: 1.67 mg/dL — ABNORMAL HIGH (ref 0.61–1.24)
GFR, Estimated: 39 mL/min — ABNORMAL LOW (ref >60)
Glucose, Bld: 90 mg/dL (ref 70–99)
Potassium: 3.6 mmol/L (ref 3.5–5.1)
Sodium: 137 mmol/L (ref 135–145)

## 2020-07-21 LAB — GLUCOSE, CAPILLARY
Glucose-Capillary: 87 mg/dL (ref 70–99)
Glucose-Capillary: 108 mg/dL — ABNORMAL HIGH (ref 70–99)

## 2020-07-21 MED ORDER — APIXABAN 2.5 MG PO TABS: 2.5000 mg | ORAL_TABLET | Freq: Two times a day (BID) | ORAL | 1 refills | Status: AC

## 2020-07-21 MED ORDER — TORSEMIDE 40 MG PO TABS: 40.0000 mg | ORAL_TABLET | Freq: Two times a day (BID) | ORAL | 0 refills | Status: DC

## 2020-07-21 NOTE — Discharge Summary (Signed)
Physician Discharge Summary  DELMONT PROSCH KZS:[id number] DOB: 24-Nov-1931 DOA: 07/11/2020  PCP: Midge Minium, MD  Admit date: 07/11/2020 Discharge date: 07/21/2020  Admitted From: home Disposition:  home  Recommendations for Outpatient Follow-up:  Follow up with PCP and cardiology  in 1 week Please obtain BMP in one week  Home Health:yes  Equipment/Devices: none  Discharge Condition: Stable Code Status:   Code Status: DNR Diet recommendation:  Diet Order             Diet - low sodium heart healthy           Diet Carb Modified Fluid consistency: Thin; Room service appropriate? Yes  Diet effective now                    Brief/Interim Summary: 87 year old male with A. fib on Coumadin, diastolic CHF, CKD stage IIIb, HTN, T2DM chronic venous insufficiency followed at wound clinic admitted with shortness of breath progressively worsening swelling of lower extremities including scrotum, orthopnea. Patient seen in the ED and CTA chest negative for PE but bilateral pleural effusion left greater than right, also right thigh bruising CT showed hematoma 8 X3 centimeter, lower extremity duplex negative for DVT BNP 420 INR 3.8 hemoglobin 11.9 g. Penicillin antibiotics to cover for pneumonia subsequently on IV Lasix for volume overload, cardio consulted Coumadin held. Patient continued on IV diuresis with lower leg edema significantly improving. Patient was extensively diuresed with IV Lasix per cardiology.  Transition to oral torsemide 7/19 and monitor overnight had good urine output and deemed stable for discharge home leg swelling is much better.  She will follow-up with cardiology repeat BMP in 1 week.  Discharge Diagnoses:  Acute on chronic diastolic CHF exacerbation Bilateral pleural effusion due to #1 LVEF preserved  dilated LA with LE edema/scrotal edema/shortness of breath/pleural effusion.  No shortness of breath.  Has extensive lower leg edema and much improved continue  torsemide follow-up with cardiology as outpatient in 1 week for BMP.   Pro-Cal was negative,antibiotic were discontinued, ruled out pneumonia  AKI on CKD stage IIIb Baseline creatinine 1.4-1.5.  Creatinine in the range of 1.7- 2, today 1.6.  Stable outpatient BMP follow-up in a week.   Recent Labs  Lab 07/17/20 0504 07/18/20 0516 07/19/20 0704 07/20/20 0420 07/21/20 0407  BUN 40* 42* 40* 37* 38*  CREATININE 2.02* 1.72* 1.89* 1.78* 1.67*   Right thigh hematoma, in the setting of supra-therapeutic INR, Coumadin on hold, and discontinued negative for duplex CT lower extremity reviewed.  Changed to Eliquis 2.5 mg twice daily per cardio.  Hemoglobin has been stable Recent Labs  Lab 07/15/20 0437 07/16/20 0422 07/17/20 0504  HGB 9.3* 10.0* 10.6*  HCT 29.4* 31.3* 33.4*  A. fib chronic on Coumadin on hold due to hematoma.  Cardiology following closely.  Cardiology planned DOAC/Eliquis upon discharge. Coupon arranged.   Morbid obesity with BMI 32:he will benefit with weight loss and healthy lifestyle.  Chronic venous insufficiency: He will need to continue following up with wound care.  Refused compression stocking.    Essential hypertension:  BP well controlled continue  Toprol and diuretics.  Normocytic anemia:Monitor.  Hemoglobin has been stable  Type 2 diabetes mellitus: Controlled. hemoglobin a1c normal 5.6 cont ssi  Hyperlipidemia: cont statin.    Consults: cardiology  Subjective: Aaox3, feels good and wants to go home today  Discharge Exam: Vitals:   07/20/20 2021 07/21/20 0519  BP: 134/60 132/60  Pulse: 64 70  Resp: 18 18  Temp: 97.9 F (36.6 C) 97.7 F (36.5 C)  SpO2: 93% 95%   General: Pt is alert, awake, not in acute distress Cardiovascular: RRR, S1/S2 +, no rubs, no gallops Respiratory: CTA bilaterally, no wheezing, no rhonchi Abdominal: Soft, NT, ND, bowel sounds + Extremities: no edema, no cyanosis  Discharge Instructions  Discharge Instructions      (HEART FAILURE PATIENTS) Call MD:  Anytime you have any of the following symptoms: 1) 3 pound weight gain in 24 hours or 5 pounds in 1 week 2) shortness of breath, with or without a dry hacking cough 3) swelling in the hands, feet or stomach 4) if you have to sleep on extra pillows at night in order to breathe.   Complete by: As directed    Diet - low sodium heart healthy   Complete by: As directed    Discharge instructions   Complete by: As directed    Check bmp in 1 week   Please call call MD or return to ER for similar or worsening recurring problem that brought you to hospital or if any fever,nausea/vomiting,abdominal pain, uncontrolled pain, chest pain,  shortness of breath or any other alarming symptoms.  Please follow-up your doctor as instructed in a week time and call the office for appointment.  Please avoid alcohol, smoking, or any other illicit substance and maintain healthy habits including taking your regular medications as prescribed.  You were cared for by a hospitalist during your hospital stay. If you have any questions about your discharge medications or the care you received while you were in the hospital after you are discharged, you can call the unit and ask to speak with the hospitalist on call if the hospitalist that took care of you is not available.  Once you are discharged, your primary care physician will handle any further medical issues. Please note that NO REFILLS for any discharge medications will be authorized once you are discharged, as it is imperative that you return to your primary care physician (or establish a relationship with a primary care physician if you do not have one) for your aftercare needs so that they can reassess your need for medications and monitor your lab values   Discharge wound care:   Complete by: As directed    Cover daily with dressing   Increase activity slowly   Complete by: As directed       Allergies as of 07/21/2020        Reactions   Influenza Vac Split [influenza Virus Vaccine] Nausea And Vomiting        Medication List     STOP taking these medications    lisinopril 2.5 MG tablet Commonly known as: ZESTRIL   warfarin 5 MG tablet Commonly known as: COUMADIN       TAKE these medications    allopurinol 300 MG tablet Commonly known as: ZYLOPRIM TAKE 1 TABLET (300 MG TOTAL) BY MOUTH DAILY.   apixaban 2.5 MG Tabs tablet Commonly known as: Eliquis Take 1 tablet (2.5 mg total) by mouth 2 (two) times daily.   CALCIUM 1200 PO Take by mouth.   metFORMIN 500 MG tablet Commonly known as: GLUCOPHAGE TAKE 1 TABLET BY MOUTH EVERY DAY   metoprolol succinate 50 MG 24 hr tablet Commonly known as: TOPROL-XL TAKE 1 TABLET BY MOUTH EVERY DAY   simvastatin 20 MG tablet Commonly known as: ZOCOR TAKE 1 TABLET BY MOUTH EVERY DAY   Torsemide 40 MG Tabs Take 40 mg by mouth 2 (  two) times daily. What changed:  medication strength how much to take when to take this   vitamin B-12 500 MCG tablet Commonly known as: CYANOCOBALAMIN Take 500 mcg by mouth daily.               Durable Medical Equipment  (From admission, onward)           Start     Ordered   07/13/20 1259  For home use only DME Other see comment  Once       Comments: Tub bench  Question:  Length of Need  Answer:  6 Months   07/13/20 1259   07/13/20 1258  For home use only DME Walker rolling  Once       Question Answer Comment  Walker: With [employer] Wheels   Patient needs a walker to treat with the following condition Unsteady gait      07/13/20 1258              Discharge Care Instructions  (From admission, onward)           Start     Ordered   07/21/20 0000  Discharge wound care:       Comments: Cover daily with dressing   07/21/20 0952            Follow-up Information     [facility] Follow up.   Specialty: [place] Why: [facility] physical therapy Contact information: [place] [address] [phone]         [facility] Follow up.   Why: tub bench, rolling walker Contact information: [address] [place] [address] [phone]         Belva Crome, MD Follow up on 08/10/2020.   Specialty: Cardiology Why: please arraive 830 AM for your post [place] cardiology appointment Contact information: [address] [address] [phone]         [facility] Follow up on 07/28/2020.   Specialty: Cardiology Why: Please arrive between 8-4 for your blood work, no fasting needed Contact information: [address] [place] [address] [phone]               Allergies  Allergen Reactions   Influenza Vac Split [Influenza Virus Vaccine] Nausea And Vomiting    The results of significant diagnostics from this hospitalization (including imaging, microbiology, ancillary and laboratory) are listed below for reference.    Microbiology: Recent Results (from the past 240 hour(s))  Resp Panel by RT-PCR (Flu A&B, Covid) Nasopharyngeal Swab     Status: None   Collection Time: 07/11/20  6:10 PM   Specimen: Nasopharyngeal Swab; Nasopharyngeal(NP) swabs in vial transport medium  Result Value Ref Range Status   SARS Coronavirus 2 by RT PCR NEGATIVE NEGATIVE Final    Comment: (NOTE) SARS-CoV-2 target nucleic acids are NOT DETECTED.  The SARS-CoV-2 RNA is generally detectable in upper respiratory specimens during the acute phase of infection. The lowest concentration of SARS-CoV-2 viral copies this assay can detect is 138 copies/mL. A negative result does not preclude SARS-Cov-2 infection and should not be used as the sole basis for treatment or other patient management decisions. A negative result may occur with  improper specimen collection/handling, submission of specimen other than nasopharyngeal swab, presence of viral  mutation(s) within the areas targeted by this assay, and inadequate number of viral copies(<138 copies/mL). A  negative result must be combined with clinical observations, patient history, and epidemiological information. The expected result is Negative.  Fact Sheet for Patients:  EntrepreneurPulse.com.au  Fact Sheet for Healthcare Providers:  IncredibleEmployment.be  This test is no t yet approved or cleared by the [place] FDA and  has been authorized for detection and/or diagnosis of SARS-CoV-2 by FDA under an Emergency Use Authorization (EUA). This EUA will remain  in effect (meaning this test can be used) for the duration of the COVID-19 declaration under Section 564(b)(1) of the Act, 21 U.S.C.section 360bbb-3(b)(1), unless the authorization is terminated  or revoked sooner.       Influenza A by PCR NEGATIVE NEGATIVE Final   Influenza B by PCR NEGATIVE NEGATIVE Final    Comment: (NOTE) The Xpert Xpress SARS-CoV-2/FLU/RSV plus assay is intended as an aid in the diagnosis of influenza from Nasopharyngeal swab specimens and should not be used as a sole basis for treatment. Nasal washings and aspirates are unacceptable for Xpert Xpress SARS-CoV-2/FLU/RSV testing.  Fact Sheet for Patients: EntrepreneurPulse.com.au  Fact Sheet for Healthcare Providers: IncredibleEmployment.be  This test is not yet approved or cleared by the [place] FDA and has been authorized for detection and/or diagnosis of SARS-CoV-2 by FDA under an Emergency Use Authorization (EUA). This EUA will remain in effect (meaning this test can be used) for the duration of the COVID-19 declaration under Section 564(b)(1) of the Act, 21 U.S.C. section 360bbb-3(b)(1), unless the authorization is terminated or revoked.  Performed at [facility], [place] [address], [place], [place] [address]      Procedures/Studies: DG Chest 2 View  Result Date: 07/11/2020 CLINICAL DATA:  Four weeks of lower extremity swelling and edema. Shortness of breath on exertion. EXAM: CHEST - 2 VIEW COMPARISON:  CT abdomen January 21, 2016. FINDINGS: The heart size and mediastinal contours are partially obscured. Aortic atherosclerosis. Moderate hiatal hernia. Moderate left pleural effusion with adjacent parenchymal consolidation versus atelectasis. Right lung is predominantly clear. Prior lower thoracic vertebral augmentation. IMPRESSION: Moderate left pleural effusion with adjacent parenchymal consolidation versus atelectasis. Electronically Signed   By: Dahlia Bailiff MD   On: 07/11/2020 16:16   CT Angio Chest PE W and/or Wo Contrast  Result Date: 07/11/2020 CLINICAL DATA:  Increasing shortness of breath EXAM: CT ANGIOGRAPHY CHEST WITH CONTRAST TECHNIQUE: Multidetector CT imaging of the chest was performed using the standard protocol during bolus administration of intravenous contrast. Multiplanar CT image reconstructions and MIPs were obtained to evaluate the vascular anatomy. CONTRAST:  19mL OMNIPAQUE IOHEXOL 350 MG/ML SOLN COMPARISON:  Chest x-ray from earlier in the same day. FINDINGS: Cardiovascular: Thoracic aorta demonstrates atherosclerotic calcifications without aneurysmal dilatation. Coronary calcifications are seen. Mild cardiac enlargement is noted. Pulmonary artery is well visualized bilaterally within normal branching pattern. No intraluminal filling defect is identified to suggest pulmonary embolism. Mediastinum/Nodes: Thoracic inlet is within normal limits. No sizable hilar or mediastinal adenopathy is noted. The esophagus as visualized is within normal limits with the exception of a sliding-type hiatal hernia. Lungs/Pleura: Bilateral pleural effusions are noted, left considerably greater than right. Right lung demonstrates basilar lower lobe atelectatic changes. More marked consolidation is noted in the  left lower lobe and lingula. No discrete mass is noted on this exam. No sizable parenchymal nodule is seen. Upper Abdomen: Visualized upper abdomen is within normal limits. Musculoskeletal: Degenerative changes of the thoracic spine are noted. No acute rib abnormality is noted. Compression deformity with evidence of prior vertebral augmentation is seen at T12.  Mild upper compression deformities are noted which appear chronic in nature. Review of the MIP images confirms the above findings. IMPRESSION: Bilateral pleural effusions left greater than right with associated consolidation. Hiatal hernia. No evidence of pulmonary emboli Aortic Atherosclerosis (ICD10-I70.0). Electronically Signed   By: Inez Catalina M.D.   On: 07/11/2020 19:42   ECHOCARDIOGRAM COMPLETE  Result Date: 07/12/2020    ECHOCARDIOGRAM REPORT   Patient Name:   JAVIEL CANEPA Date of Exam: 07/12/2020 Medical Rec #:  [id number]      Height:       71.0 in Accession #:    [id number]     Weight:       233.7 lb Date of Birth:  Feb 08, 1931      BSA:          2.253 m Patient Age:    20 years       BP:           133/56 mmHg Patient Gender: M              HR:           75 bpm. Exam Location:  Inpatient Procedure: 2D Echo, Cardiac Doppler and Color Doppler Indications:    Dyspnea R06.00  History:        Patient has prior history of Echocardiogram examinations, most                 recent 11/12/2018. Arrythmias:Atrial Fibrillation; Risk                 Factors:Diabetes, Dyslipidemia and Hypertension.  Sonographer:    Bernadene Person RDCS Referring Phys: [place]  1. Left ventricular ejection fraction, by estimation, is 60 to 65%. The left ventricle has normal function. The left ventricle has no regional wall motion abnormalities. Diastolic function indeterminant due to Afib.  2. Right ventricular systolic function is normal. The right ventricular size is normal. There is moderately elevated pulmonary artery systolic pressure. The  estimated right ventricular systolic pressure is 96.2 mmHg.  3. Left atrial size was severely dilated.  4. Right atrial size was mildly dilated.  5. Moderate pleural effusion in the left lateral region.  6. The mitral valve is grossly normal. Mild mitral valve regurgitation.  7. The aortic valve is tricuspid. There is mild calcification of the aortic valve. There is moderate thickening of the aortic valve. Aortic valve regurgitation is mild to moderate. Mild to moderate aortic valve sclerosis/calcification is present, without any evidence of aortic stenosis.  8. Aortic dilatation noted. There is borderline dilatation of the aortic root, measuring 37 mm. There is moderate dilatation of the ascending aorta, measuring 40 mm.  9. The inferior vena cava is dilated in size with <50% respiratory variability, suggesting right atrial pressure of 15 mmHg. Comparison(s): Compared to prior TTE in 11/2018, a left sided moderate pleural effusion is now present. FINDINGS  Left Ventricle: Left ventricular ejection fraction, by estimation, is 60 to 65%. The left ventricle has normal function. The left ventricle has no regional wall motion abnormalities. The left ventricular internal cavity size was normal in size. There is  no left ventricular hypertrophy. Diastolic function indeterminant due to Afib. Right Ventricle: The right ventricular size is normal. No increase in right ventricular wall thickness. Right ventricular systolic function is normal. There is moderately elevated pulmonary artery systolic pressure. The tricuspid regurgitant velocity is 2.92 m/s, and with an assumed right atrial pressure of 15 mmHg, the estimated right ventricular systolic  pressure is 49.1 mmHg. Left Atrium: Left atrial size was severely dilated. Right Atrium: Right atrial size was mildly dilated. Pericardium: Trivial pericardial effusion is present. Mitral Valve: The mitral valve is grossly normal. There is mild thickening of the mitral valve  leaflet(s). There is mild calcification of the mitral valve leaflet(s). Mild to moderate mitral annular calcification. Mild mitral valve regurgitation. Tricuspid Valve: The tricuspid valve is normal in structure. Tricuspid valve regurgitation is mild. Aortic Valve: The aortic valve is tricuspid. There is mild calcification of the aortic valve. There is moderate thickening of the aortic valve. Aortic valve regurgitation is mild to moderate. Aortic regurgitation PHT measures 351 msec. Mild to moderate aortic valve sclerosis/calcification is present, without any evidence of aortic stenosis. Aortic valve mean gradient measures 11.7 mmHg. Aortic valve peak gradient measures 19.9 mmHg. Aortic valve area, by VTI measures 2.49 cm. Pulmonic Valve: The pulmonic valve was normal in structure. Pulmonic valve regurgitation is trivial. Aorta: Aortic dilatation noted. There is borderline dilatation of the aortic root, measuring 37 mm. There is moderate dilatation of the ascending aorta, measuring 40 mm. Venous: The inferior vena cava is dilated in size with less than 50% respiratory variability, suggesting right atrial pressure of 15 mmHg. IAS/Shunts: No atrial level shunt detected by color flow Doppler. Additional Comments: There is a moderate pleural effusion in the left lateral region.  LEFT VENTRICLE PLAX 2D LVIDd:         4.60 cm LVIDs:         2.90 cm LV PW:         2.50 cm LV IVS:        1.00 cm LVOT diam:     2.10 cm LV SV:         120 LV SV Index:   53 LVOT Area:     3.46 cm  RIGHT VENTRICLE RV S prime:     12.30 cm/s TAPSE (M-mode): 2.1 cm LEFT ATRIUM              Index       RIGHT ATRIUM           Index LA diam:        6.20 cm  2.75 cm/m  RA Area:     26.40 cm LA Vol (A2C):   175.0 ml 77.68 ml/m RA Volume:   79.10 ml  35.11 ml/m LA Vol (A4C):   171.0 ml 75.91 ml/m LA Biplane Vol: 178.0 ml 79.01 ml/m  AORTIC VALVE AV Area (Vmax):    2.30 cm AV Area (Vmean):   2.20 cm AV Area (VTI):     2.49 cm AV Vmax:            223.00 cm/s AV Vmean:          158.667 cm/s AV VTI:            0.483 m AV Peak Grad:      19.9 mmHg AV Mean Grad:      11.7 mmHg LVOT Vmax:         148.00 cm/s LVOT Vmean:        101.000 cm/s LVOT VTI:          0.347 m LVOT/AV VTI ratio: 0.72 AI PHT:            351 msec  AORTA Ao Root diam: 3.70 cm TRICUSPID VALVE TR Peak grad:   34.1 mmHg TR Vmax:        292.00 cm/s  SHUNTS Systemic  VTI:  0.35 m Systemic Diam: 2.10 cm Gwyndolyn Kaufman MD Electronically signed by Gwyndolyn Kaufman MD Signature Date/Time: 07/12/2020/4:03:37 PM    Final    CT EXTREMITY LOWER RIGHT WO CONTRAST  Result Date: 07/12/2020 CLINICAL DATA:  Soft tissue mass of thigh.  Spontaneous hemorrhage EXAM: CT OF THE LOWER RIGHT EXTREMITY WITHOUT CONTRAST TECHNIQUE: Multidetector CT imaging of the right lower extremity was performed according to the standard protocol. COMPARISON:  None. FINDINGS: Bones/Joint/Cartilage Negative for fracture, erosion, or bone lesion Ligaments Suboptimally assessed by CT. Muscles and Tendons Heterogeneously high-density mass in the medial compartment thigh musculature, elongated along muscle fibers and measuring 8 cm in length by approximately 3.3 cm in diameter. Soft tissues Marked generalized subcutaneous edema which is symmetric in the visible lower extremities and especially marked in the scrotum. No soft tissue gas or subcutaneous mass/hematoma. IMPRESSION: 1. High-density mass in the medial compartment right thigh that is likely hematoma, ~8 x 3 cm. The location is deep for visible bruising and the CT appearance is nonspecific, consider follow-up in few months to ensure resolution and exclude a mass. 2. Marked anasarca. Electronically Signed   By: Monte Fantasia M.D.   On: 07/12/2020 04:24   VAS [place] LOWER EXTREMITY VENOUS (DVT) (MC and WL 7a-7p)  Result Date: 07/12/2020  Lower Venous DVT Study Patient Name:  NASIF BOS  Date of Exam:   07/11/2020 Medical Rec #: [id number]       Accession #:    [id number]  Date of Birth: 14-Jun-1931       Patient Gender: M Patient Age:   89Y Exam Location[facility]  [facility] Procedure:      VAS [place] LOWER EXTREMITY VENOUS (DVT) Referring Phys: [id number] [place] --------------------------------------------------------------------------------  Indications: Swelling, Edema, and Pain.  Limitations: Poor ultrasound/tissue interface. Comparison Study: no prior Performing Technologist: Archie Patten RVS  Examination Guidelines: A complete evaluation includes B-mode imaging, spectral Doppler, color Doppler, and power Doppler as needed of all accessible portions of each vessel. Bilateral testing is considered an integral part of a complete examination. Limited examinations for reoccurring indications may be performed as noted. The reflux portion of the exam is performed with the patient in reverse Trendelenburg.  +---------+---------------+---------+-----------+----------+-------------------+ RIGHT    CompressibilityPhasicitySpontaneityPropertiesThrombus Aging      +---------+---------------+---------+-----------+----------+-------------------+ CFV      Full           Yes      Yes                                      +---------+---------------+---------+-----------+----------+-------------------+ SFJ      Full                                                             +---------+---------------+---------+-----------+----------+-------------------+ FV Prox  Full                                                             +---------+---------------+---------+-----------+----------+-------------------+ FV Mid  Yes      Yes                                      +---------+---------------+---------+-----------+----------+-------------------+ FV Distal               Yes      Yes                                      +---------+---------------+---------+-----------+----------+-------------------+ PFV      Full                                                              +---------+---------------+---------+-----------+----------+-------------------+ POP      Full           Yes      Yes                                      +---------+---------------+---------+-----------+----------+-------------------+ PTV      Full                                                             +---------+---------------+---------+-----------+----------+-------------------+ PERO                                                  Not well visualized +---------+---------------+---------+-----------+----------+-------------------+   +---------+---------------+---------+-----------+----------+-------------------+ LEFT     CompressibilityPhasicitySpontaneityPropertiesThrombus Aging      +---------+---------------+---------+-----------+----------+-------------------+ CFV      Full           Yes      Yes                                      +---------+---------------+---------+-----------+----------+-------------------+ SFJ      Full                                                             +---------+---------------+---------+-----------+----------+-------------------+ FV Prox  Full                                                             +---------+---------------+---------+-----------+----------+-------------------+ FV Mid   Full                                                             +---------+---------------+---------+-----------+----------+-------------------+  FV DistalFull                                                             +---------+---------------+---------+-----------+----------+-------------------+ PFV      Full                                                             +---------+---------------+---------+-----------+----------+-------------------+ POP      Full           Yes      Yes                                       +---------+---------------+---------+-----------+----------+-------------------+ PTV      Full                                                             +---------+---------------+---------+-----------+----------+-------------------+ PERO                                                  Not well visualized +---------+---------------+---------+-----------+----------+-------------------+     Summary: RIGHT: - There is no evidence of deep vein thrombosis in the lower extremity. However, portions of this examination were limited- see technologist comments above.  - No cystic structure found in the popliteal fossa.  LEFT: - There is no evidence of deep vein thrombosis in the lower extremity. However, portions of this examination were limited- see technologist comments above.  - No cystic structure found in the popliteal fossa.  *See table(s) above for measurements and observations. Electronically signed by Monica Martinez MD on 07/12/2020 at 10:25:31 AM.    Final     Labs: BNP (last 3 results) Recent Labs    07/11/20 1556  BNP 626.9*   Basic Metabolic Panel: Recent Labs  Lab 07/15/20 0437 07/16/20 0422 07/17/20 0504 07/18/20 0516 07/19/20 0704 07/20/20 0420 07/21/20 0407  NA 137 135 138 139 136 138 137  K 3.9 3.8 4.4 4.1 3.7 3.7 3.6  CL 101 99 100 97* 99 96* 97*  CO2 29 28 33* 32 30 31 30   GLUCOSE 91 92 100* 101* 93 104* 90  BUN 33* 36* 40* 42* 40* 37* 38*  CREATININE 1.91* 1.85* 2.02* 1.72* 1.89* 1.78* 1.67*  CALCIUM 8.7* 8.7* 9.2 9.6 9.0 9.1 8.6*  MG 2.0 2.2 2.2  --   --   --   --    Liver Function Tests: No results for input(s): AST, ALT, ALKPHOS, BILITOT, PROT, ALBUMIN in the last 168 hours. No results for input(s): LIPASE, AMYLASE in the last 168 hours. No results for input(s): AMMONIA in the last 168 hours. CBC: Recent Labs  Lab 07/15/20 0437 07/16/20 0422 07/17/20 0504  WBC  6.2 4.8 5.5  NEUTROABS 4.1 2.9 3.5  HGB 9.3* 10.0* 10.6*  HCT 29.4* 31.3* 33.4*   MCV 96.7 97.2 97.7  PLT 245 244 265   Cardiac Enzymes: No results for input(s): CKTOTAL, CKMB, CKMBINDEX, TROPONINI in the last 168 hours. BNP: Invalid input(s): POCBNP CBG: Recent Labs  Lab 07/20/20 0738 07/20/20 1129 07/20/20 1617 07/20/20 2118 07/21/20 0722  GLUCAP 94 120* 95 111* 87   D-Dimer No results for input(s): DDIMER in the last 72 hours. Hgb A1c No results for input(s): HGBA1C in the last 72 hours. Lipid Profile No results for input(s): CHOL, HDL, LDLCALC, TRIG, CHOLHDL, LDLDIRECT in the last 72 hours. Thyroid function studies No results for input(s): TSH, T4TOTAL, T3FREE, THYROIDAB in the last 72 hours.  Invalid input(s): FREET3 Anemia work up No results for input(s): VITAMINB12, FOLATE, FERRITIN, TIBC, IRON, RETICCTPCT in the last 72 hours. Urinalysis    Component Value Date/Time   COLORURINE YELLOW 07/12/2020 [place] 07/12/2020 1644   LABSPEC 1.012 07/12/2020 1644   PHURINE 6.0 07/12/2020 1644   GLUCOSEU NEGATIVE 07/12/2020 1644   HGBUR NEGATIVE 07/12/2020 [place] 07/12/2020 [place] 07/12/2020 1644   PROTEINUR NEGATIVE 07/12/2020 1644   NITRITE NEGATIVE 07/12/2020 1644   LEUKOCYTESUR NEGATIVE 07/12/2020 1644   Sepsis Labs Invalid input(s): PROCALCITONIN,  WBC,  LACTICIDVEN Microbiology Recent Results (from the past 240 hour(s))  Resp Panel by RT-PCR (Flu A&B, Covid) Nasopharyngeal Swab     Status: None   Collection Time: 07/11/20  6:10 PM   Specimen: Nasopharyngeal Swab; Nasopharyngeal(NP) swabs in vial transport medium  Result Value Ref Range Status   SARS Coronavirus 2 by RT PCR NEGATIVE NEGATIVE Final    Comment: (NOTE) SARS-CoV-2 target nucleic acids are NOT DETECTED.  The SARS-CoV-2 RNA is generally detectable in upper respiratory specimens during the acute phase of infection. The lowest concentration of SARS-CoV-2 viral copies this assay can detect is 138 copies/mL. A negative  result does not preclude SARS-Cov-2 infection and should not be used as the sole basis for treatment or other patient management decisions. A negative result may occur with  improper specimen collection/handling, submission of specimen other than nasopharyngeal swab, presence of viral mutation(s) within the areas targeted by this assay, and inadequate number of viral copies(<138 copies/mL). A negative result must be combined with clinical observations, patient history, and epidemiological information. The expected result is Negative.  Fact Sheet for Patients:  EntrepreneurPulse.com.au  Fact Sheet for Healthcare Providers:  IncredibleEmployment.be  This test is no t yet approved or cleared by the [place] FDA and  has been authorized for detection and/or diagnosis of SARS-CoV-2 by FDA under an Emergency Use Authorization (EUA). This EUA will remain  in effect (meaning this test can be used) for the duration of the COVID-19 declaration under Section 564(b)(1) of the Act, 21 U.S.C.section 360bbb-3(b)(1), unless the authorization is terminated  or revoked sooner.       Influenza A by PCR NEGATIVE NEGATIVE Final   Influenza B by PCR NEGATIVE NEGATIVE Final    Comment: (NOTE) The Xpert Xpress SARS-CoV-2/FLU/RSV plus assay is intended as an aid in the diagnosis of influenza from Nasopharyngeal swab specimens and should not be used as a sole basis for treatment. Nasal washings and aspirates are unacceptable for Xpert Xpress SARS-CoV-2/FLU/RSV testing.  Fact Sheet for Patients: EntrepreneurPulse.com.au  Fact Sheet for Healthcare Providers: IncredibleEmployment.be  This test is not yet approved or cleared by the [place]  FDA and has been authorized for detection and/or diagnosis of SARS-CoV-2 by FDA under an Emergency Use Authorization (EUA). This EUA will remain in effect (meaning this test can be used)  for the duration of the COVID-19 declaration under Section 564(b)(1) of the Act, 21 U.S.C. section 360bbb-3(b)(1), unless the authorization is terminated or revoked.  Performed at [facility], [place] [address] [place], [place] [address]    Time coordinating discharge: 35 minutes  SIGNED: Antonieta Pert, MD  [facility] 07/21/2020, 9:53 AM  If 7PM-7AM, please contact night-coverage [email or web address]

## 2020-07-21 NOTE — Progress Notes (Signed)
Provided and discussed discharge instructions. Addressed all questions and concerns. Removed IV intact. [name]

## 2020-07-21 NOTE — Consult Note (Signed)
[facility] Inpatient Consult   07/21/2020  [name] 17-Oct-1931 [id number]  [facility] CM Follow Up:  Per review, patient being followed by primary care office embedded CCM service team for post hospital needs. Will continue to follow for progression.  Netta Cedars, MSN, [facility] Liaison Nurse Mobile Phone [phone]  Toll free office [phone]

## 2020-07-21 NOTE — Progress Notes (Addendum)
Progress Note  Patient Name: [name] Date of Encounter: 07/21/2020  [facility] Cardiologist: Sinclair Grooms, MD   Subjective   Patient states he is doing very well. He states he is urinating well. He denied any SOB and chest pain. He states he is able to afford Eliquis for 45 dollars a month. He is able to tolerate activities yesterday and denied worsening SOB with exertion.   Inpatient Medications    Scheduled Meds:  allopurinol  300 mg Oral Daily   docusate sodium  100 mg Oral BID   feeding supplement (GLUCERNA SHAKE)  237 mL Oral BID BM   folic acid  1 mg Oral Daily   insulin aspart  0-5 Units Subcutaneous QHS   insulin aspart  0-9 Units Subcutaneous TID WC   metoprolol succinate  50 mg Oral Daily   simvastatin  20 mg Oral Daily   sodium chloride flush  3 mL Intravenous Q12H   torsemide  40 mg Oral BID   Continuous Infusions:  sodium chloride     PRN Meds: sodium chloride, acetaminophen **OR** acetaminophen, guaiFENesin-dextromethorphan, HYDROcodone-acetaminophen, hyoscyamine, sodium chloride flush   Vital Signs    Vitals:   07/20/20 1240 07/20/20 2021 07/21/20 0500 07/21/20 0519  BP: (!) 135/50 134/60  132/60  Pulse: 63 64  70  Resp: 16 18  18   Temp: 97.8 F (36.6 C) 97.9 F (36.6 C)  97.7 F (36.5 C)  TempSrc: Oral Oral  Oral  SpO2: 97% 93%  95%  Weight:   97.1 kg   Height:        Intake/Output Summary (Last 24 hours) at 07/21/2020 0820 Last data filed at 07/21/2020 0723 Gross per 24 hour  Intake 240 ml  Output 1750 ml  Net -1510 ml   Last 3 Weights 07/21/2020 07/20/2020 07/19/2020  Weight (lbs) 214 lb 214 lb 15.2 oz 214 lb 4.6 oz  Weight (kg) 97.07 kg 97.5 kg 97.2 kg      Telemetry    A fib with ventricular rate of 60s, artifacts - Personally Reviewed  ECG    N/A this AM - Personally Reviewed  Physical Exam   GEN: No acute distress.   Neck: no JVD Cardiac: RRR, no murmurs, rubs, or gallops.   Respiratory: Clear to auscultation  bilaterally. On room air. Speaks full sentence  GI: Soft, nontender, non-distended  MS: BLE 1+ pitting edema, scrotum and thigh edema resolved; No deformity. Neuro:  Nonfocal  Psych: Normal affect   Labs    High Sensitivity Troponin:   Recent Labs  Lab 07/11/20 1556 07/11/20 1749  TROPONINIHS 11 11      Chemistry Recent Labs  Lab 07/19/20 0704 07/20/20 0420 07/21/20 0407  NA 136 138 137  K 3.7 3.7 3.6  CL 99 96* 97*  CO2 30 31 30   GLUCOSE 93 104* 90  BUN 40* 37* 38*  CREATININE 1.89* 1.78* 1.67*  CALCIUM 9.0 9.1 8.6*  GFRNONAA 34* 36* 39*  ANIONGAP 7 11 10      Hematology Recent Labs  Lab 07/15/20 0437 07/16/20 0422 07/17/20 0504  WBC 6.2 4.8 5.5  RBC 3.04* 3.22* 3.42*  HGB 9.3* 10.0* 10.6*  HCT 29.4* 31.3* 33.4*  MCV 96.7 97.2 97.7  MCH 30.6 31.1 31.0  MCHC 31.6 31.9 31.7  RDW 17.5* 17.4* 17.5*  PLT 245 244 265    BNPNo results for input(s): BNP, PROBNP in the last 168 hours.   DDimer No results for input(s): DDIMER in  the last 168 hours.   Radiology    No results found.  Cardiac Studies   Echo from 07/12/20:   1. Left ventricular ejection fraction, by estimation, is 60 to 65%. The  left ventricle has normal function. The left ventricle has no regional  wall motion abnormalities. Diastolic function indeterminant due to Afib.   2. Right ventricular systolic function is normal. The right ventricular  size is normal. There is moderately elevated pulmonary artery systolic  pressure. The estimated right ventricular systolic pressure is 64.3 mmHg.   3. Left atrial size was severely dilated.   4. Right atrial size was mildly dilated.   5. Moderate pleural effusion in the left lateral region.   6. The mitral valve is grossly normal. Mild mitral valve regurgitation.   7. The aortic valve is tricuspid. There is mild calcification of the  aortic valve. There is moderate thickening of the aortic valve. Aortic  valve regurgitation is mild to moderate. Mild  to moderate aortic valve  sclerosis/calcification is present,  without any evidence of aortic stenosis.   8. Aortic dilatation noted. There is borderline dilatation of the aortic  root, measuring 37 mm. There is moderate dilatation of the ascending  aorta, measuring 40 mm.   9. The inferior vena cava is dilated in size with <50% respiratory  variability, suggesting right atrial pressure of 15 mmHg.   Comparison(s): Compared to prior TTE in 11/2018, a left sided moderate  pleural effusion is now present.   Patient Profile     87 y.o. male with PMH of chronic diastolic heart failure, permanent atrial fibrillation, CKD IIIb, moderate AI, who presented to ER on 07/11/20 for worsening SOB and LE edema with testicular swelling. Cardiology is following for decompensated diastolic heart failure.   Assessment & Plan    Acute on chronic diastolic heart failure - presented with worsening SOB and anasarca  - BNP 420 - CTA with bilateral pleural effusion L>R with associated consildation - Echo from 07/12/20 showed EF 60-65%, no RWMA, moderate elevated PSAP with RVSP 49.1 mmHg,  severe LAE, mild RAE, moderate left pleural effusion, mild MR, mild-moderate AR, borderline dilation of aortic root and moderate dilation of ascending aorta - Net -13.5L since admission, UOP 1450 ml over the past 24 hours - Weight is down from 233 >214 ibs since admission - Serum creatinine overall stable with diuresis  - transitioned to PO torsemide 40mg  BID 7/19, good response to PO diuretics, added compression stocking, can likely go home with this regimen and repeat BMP in 1 week at our office  - continue medical therapy with metoprolol XL 50mg  daily  - follow up appt scheduled with cardiology on 08/10/20   Anasarca  - appears multifactorial from CHF, hypoalbuminemia, chronic venous insufficiency ? Cirrhosis  - managed per IM   Permanent A fib - rate controlled on metoprolol - on Coumadin prior to this admission,  currently held due to right thigh hematoma 8x3 cm on CT, plan to transition to Eliquis when medically cleared by IM, CM to assist cost issues  - CHA2DS2-VASc Score 5, would resume anticoagulation when able  HTN - BP controlled on metoprolol and lasix   HLD - continue simvastatin  CKD IIIb - renal index near baseline (appears 1.6-1.9 ranges)  - 1.74 POA,  downtrend to 1.67 today/improving,  trend BMP daily  - discontinued lisinopril this admission  - repeat BMP outpatient in a week  Aortic regurgitation Aortic dilatation  - moderate AI, ascending aorta mildly  dilated 40 mm on on Echo, follow outpatient   Right thigh hematoma Type 2 DM Anemia Chronic venous insufficiency - managed per IM    For questions or updates, please contact [place] Please consult [email or web address] for contact info under        Signed, Margie Billet, NP  07/21/2020, 8:20 AM

## 2020-07-21 NOTE — Progress Notes (Signed)
BMP in 1 week post hospital discharge, follow up in the office for result.

## 2020-07-21 NOTE — TOC Transition Note (Signed)
Transition of Care [facility]) - CM/SW Discharge Note   Patient Details  Name: [name] MRN: [id number] Date of Birth: January 18, 1931  [facility] CM/SW Contact:  Ross Ludwig, LCSW Phone Number: 07/21/2020, 10:59 AM   Clinical Narrative:     Patient will be going home with home health PT through [facility].  CSW signing off please reconsult with any other social work needs, home health agency has been notified of planned discharge for today.    Final next level of care: [place] Barriers to Discharge: Barriers Resolved   Patient Goals and CMS Choice Patient states their goals for this hospitalization and ongoing recovery are:: To return back home with home health. CMS Medicare.gov Compare Post Acute Care list provided to:: Patient Choice offered to / list presented to : Patient  Discharge Placement  Patient discharging back home with home health.                     Discharge Plan and Services   Discharge Planning Services: CM Consult            DME Arranged: Tub bench, Walker rolling DME Agency: [employer] Date DME Agency Contacted: 07/13/20 Time DME Agency Contacted: [id number] Representative spoke with at DME Agency: [facility]: PT [place]: [place] Date [place]: 07/21/20 Time [place]: 1054 Representative spoke with at [place]: [place] ([place]) Interventions     Readmission Risk Interventions Readmission Risk Prevention Plan 07/12/2020  Transportation Screening Complete  PCP or Specialist Appt within 5-7 Days Complete  Home Care Screening Complete  Medication Review (RN CM) Complete  Some recent data might be hidden

## 2020-07-26 ENCOUNTER — Encounter: Payer: Self-pay | Admitting: Family Medicine

## 2020-07-26 ENCOUNTER — Ambulatory Visit: Payer: Self-pay | Admitting: General Practice

## 2020-07-26 ENCOUNTER — Telehealth (INDEPENDENT_AMBULATORY_CARE_PROVIDER_SITE_OTHER): Payer: Medicare HMO | Admitting: Family Medicine

## 2020-07-26 ENCOUNTER — Telehealth: Payer: Self-pay | Admitting: Family Medicine

## 2020-07-26 ENCOUNTER — Telehealth: Payer: Self-pay

## 2020-07-26 VITALS — Ht 71.0 in | Wt 198.0 lb

## 2020-07-26 DIAGNOSIS — N1832 Chronic kidney disease, stage 3b: Secondary | ICD-10-CM | POA: Diagnosis not present

## 2020-07-26 DIAGNOSIS — D631 Anemia in chronic kidney disease: Secondary | ICD-10-CM | POA: Diagnosis not present

## 2020-07-26 DIAGNOSIS — I83893 Varicose veins of bilateral lower extremities with other complications: Secondary | ICD-10-CM | POA: Diagnosis not present

## 2020-07-26 DIAGNOSIS — U071 COVID-19: Secondary | ICD-10-CM | POA: Diagnosis not present

## 2020-07-26 DIAGNOSIS — I130 Hypertensive heart and chronic kidney disease with heart failure and stage 1 through stage 4 chronic kidney disease, or unspecified chronic kidney disease: Secondary | ICD-10-CM | POA: Diagnosis not present

## 2020-07-26 DIAGNOSIS — I5033 Acute on chronic diastolic (congestive) heart failure: Secondary | ICD-10-CM | POA: Diagnosis not present

## 2020-07-26 DIAGNOSIS — E1151 Type 2 diabetes mellitus with diabetic peripheral angiopathy without gangrene: Secondary | ICD-10-CM | POA: Diagnosis not present

## 2020-07-26 DIAGNOSIS — I872 Venous insufficiency (chronic) (peripheral): Secondary | ICD-10-CM | POA: Diagnosis not present

## 2020-07-26 DIAGNOSIS — I482 Chronic atrial fibrillation, unspecified: Secondary | ICD-10-CM | POA: Diagnosis not present

## 2020-07-26 DIAGNOSIS — E1122 Type 2 diabetes mellitus with diabetic chronic kidney disease: Secondary | ICD-10-CM | POA: Diagnosis not present

## 2020-07-26 MED ORDER — NIRMATRELVIR/RITONAVIR (PAXLOVID)TABLET: ORAL_TABLET | ORAL | 0 refills | Status: DC

## 2020-07-26 NOTE — Telephone Encounter (Signed)
..  Home Health Verbal Orders  Agency:  [facility]: Contact and title   Requesting OT/ PT/ Skilled nursing/ Social Work/ Speech:  Physical THerapy  Reason for Request:  Strength & balance  Frequency:  1 times a week for 8 weeks    HH needs F2F w/in last 30 days    They would also like to add a speech therapist, an occupational therapist and a Education officer, museum They also need to know if patient is suppose to be taking an iron supplement?  Please advise

## 2020-07-26 NOTE — Progress Notes (Signed)
Subjective:    Patient ID: [name], male    DOB: 1931-04-15, 87 y.o.   MRN: [id number]  HPI Her with his daughter, Jackelyn Poling, for 3 days of a dry cough and fatigue. No fever or SOB. No NVD or body aches. He and his wife both tested positive for the Covid-19 virus yesterday. He is drinking plenty of fluids. He was just in the hospital from 07-11-20 to 01-22-20 for CHF. He has CKD and his last GFR was 39. Virtual Visit via Video Note  I connected with the patient on 07/26/20 at  2:45 PM EDT by a video enabled telemedicine application and verified that I am speaking with the correct person using two identifiers.  Location patient: home Location provider:work or home office Persons participating in the virtual visit: patient, provider  I discussed the limitations of evaluation and management by telemedicine and the availability of in person appointments. The patient expressed understanding and agreed to proceed.   HPI:    ROS: See pertinent positives and negatives per HPI.  Past Medical History:  Diagnosis Date   Abnormal finding on imaging of liver    Anemia    Aortic insufficiency    Blood transfusion without reported diagnosis    Chronic atrial fibrillation (HCC)    Chronic constipation    colace helps   Chronic diastolic CHF (congestive heart failure) (HCC)    Chronic venous insufficiency    CKD (chronic kidney disease), stage III (HCC)    Diabetes mellitus ([place])    Gout    Hyperlipidemia    Hypertension    Osteopenia    Stercoral ulcer of rectum    Varicose veins of both lower extremities with complications    Vitamin B12 deficiency     Past Surgical History:  Procedure Laterality Date   BUNIONECTOMY  2006   COLONOSCOPY N/A 01/22/2016   Procedure: COLONOSCOPY;  Surgeon: Manus Gunning, MD;  Location: [facility];  Service: Gastroenterology;  Laterality: N/A;   HEMORRHOID SURGERY  1970   TONSILLECTOMY AND ADENOIDECTOMY  1945    Family History  Problem  Relation Age of Onset   Alcohol abuse Father    Diabetes Father    Heart disease Father    Obesity Brother    Obesity Daughter        S/P gastric bypass     Current Outpatient Medications:    allopurinol (ZYLOPRIM) 300 MG tablet, TAKE 1 TABLET (300 MG TOTAL) BY MOUTH DAILY., Disp: 90 tablet, Rfl: 1   apixaban (ELIQUIS) 2.5 MG TABS tablet, Take 1 tablet (2.5 mg total) by mouth 2 (two) times daily., Disp: 60 tablet, Rfl: 1   Calcium Carbonate-Vit D-Min (CALCIUM 1200 PO), Take by mouth., Disp: , Rfl:    metFORMIN (GLUCOPHAGE) 500 MG tablet, TAKE 1 TABLET BY MOUTH EVERY DAY, Disp: 90 tablet, Rfl: 1   metoprolol succinate (TOPROL-XL) 50 MG 24 hr tablet, TAKE 1 TABLET BY MOUTH EVERY DAY, Disp: 90 tablet, Rfl: 1   nirmatrelvir/ritonavir EUA (PAXLOVID) TABS, (Take nirmatrelvir 150 mg one tablet twice daily for 5 days and ritonavir 100 mg one tablet twice daily for 5 days) Patient GFR is 39, Disp: 20 tablet, Rfl: 0   simvastatin (ZOCOR) 20 MG tablet, TAKE 1 TABLET BY MOUTH EVERY DAY, Disp: 90 tablet, Rfl: 1   torsemide 40 MG TABS, Take 40 mg by mouth 2 (two) times daily., Disp: 60 tablet, Rfl: 0   vitamin B-12 (CYANOCOBALAMIN) 500 MCG tablet, Take 500 mcg by  mouth daily., Disp: , Rfl:   EXAM:  VITALS per patient if applicable:  GENERAL: alert, oriented, appears well and in no acute distress  HEENT: atraumatic, conjunttiva clear, no obvious abnormalities on inspection of external nose and ears  NECK: normal movements of the head and neck  LUNGS: on inspection no signs of respiratory distress, breathing rate appears normal, no obvious gross SOB, gasping or wheezing  CV: no obvious cyanosis  MS: moves all visible extremities without noticeable abnormality  PSYCH/NEURO: pleasant and cooperative, no obvious depression or anxiety, speech and thought processing grossly intact  ASSESSMENT AND PLAN: Covid-19 infection. Treat with 5 days of Paxlovid. We adjusted the dose for his GFR. He will  follow up as needed.  Alysia Penna, MD  Discussed the following assessment and plan:  No diagnosis found.     I discussed the assessment and treatment plan with the patient. The patient was provided an opportunity to ask questions and all were answered. The patient agreed with the plan and demonstrated an understanding of the instructions.   The patient was advised to call back or seek an in-person evaluation if the symptoms worsen or if the condition fails to improve as anticipated.     Review of Systems     Objective:   Physical Exam        Assessment & Plan:

## 2020-07-26 NOTE — Telephone Encounter (Signed)
Error

## 2020-07-26 NOTE — Telephone Encounter (Signed)
Spoke with Sharyn Lull with [facility] and made her aware that patient needs an office visit first. She states she will inform patient.

## 2020-07-27 DIAGNOSIS — N1832 Chronic kidney disease, stage 3b: Secondary | ICD-10-CM | POA: Diagnosis not present

## 2020-07-27 DIAGNOSIS — I5033 Acute on chronic diastolic (congestive) heart failure: Secondary | ICD-10-CM | POA: Diagnosis not present

## 2020-07-27 DIAGNOSIS — I130 Hypertensive heart and chronic kidney disease with heart failure and stage 1 through stage 4 chronic kidney disease, or unspecified chronic kidney disease: Secondary | ICD-10-CM | POA: Diagnosis not present

## 2020-07-27 DIAGNOSIS — D631 Anemia in chronic kidney disease: Secondary | ICD-10-CM | POA: Diagnosis not present

## 2020-07-27 DIAGNOSIS — I482 Chronic atrial fibrillation, unspecified: Secondary | ICD-10-CM | POA: Diagnosis not present

## 2020-07-27 DIAGNOSIS — I83893 Varicose veins of bilateral lower extremities with other complications: Secondary | ICD-10-CM | POA: Diagnosis not present

## 2020-07-27 DIAGNOSIS — E1122 Type 2 diabetes mellitus with diabetic chronic kidney disease: Secondary | ICD-10-CM | POA: Diagnosis not present

## 2020-07-27 DIAGNOSIS — E1151 Type 2 diabetes mellitus with diabetic peripheral angiopathy without gangrene: Secondary | ICD-10-CM | POA: Diagnosis not present

## 2020-07-27 DIAGNOSIS — I872 Venous insufficiency (chronic) (peripheral): Secondary | ICD-10-CM | POA: Diagnosis not present

## 2020-07-28 ENCOUNTER — Other Ambulatory Visit: Payer: Medicare HMO

## 2020-07-28 ENCOUNTER — Other Ambulatory Visit: Payer: Self-pay | Admitting: Family Medicine

## 2020-07-28 ENCOUNTER — Telehealth: Payer: Self-pay | Admitting: Family Medicine

## 2020-07-28 ENCOUNTER — Telehealth (INDEPENDENT_AMBULATORY_CARE_PROVIDER_SITE_OTHER): Payer: Medicare HMO | Admitting: Registered Nurse

## 2020-07-28 ENCOUNTER — Other Ambulatory Visit: Payer: Self-pay

## 2020-07-28 DIAGNOSIS — E8770 Fluid overload, unspecified: Secondary | ICD-10-CM | POA: Diagnosis not present

## 2020-07-28 DIAGNOSIS — I5030 Unspecified diastolic (congestive) heart failure: Secondary | ICD-10-CM | POA: Diagnosis not present

## 2020-07-28 DIAGNOSIS — W19XXXA Unspecified fall, initial encounter: Secondary | ICD-10-CM

## 2020-07-28 DIAGNOSIS — Z09 Encounter for follow-up examination after completed treatment for conditions other than malignant neoplasm: Secondary | ICD-10-CM | POA: Diagnosis not present

## 2020-07-28 DIAGNOSIS — R531 Weakness: Secondary | ICD-10-CM | POA: Diagnosis not present

## 2020-07-28 NOTE — Patient Instructions (Signed)
° ° ° °  If you have lab work done today you will be contacted with your lab results within the next 2 weeks.  If you have not heard from us then please contact us. The fastest way to get your results is to register for My Chart. ° ° °IF you received an x-ray today, you will receive an invoice from [facility]. Please contact [facility] at [phone] with questions or concerns regarding your invoice.  ° °IF you received labwork today, you will receive an invoice from [facility]. Please contact [facility] at [phone] with questions or concerns regarding your invoice.  ° °Our billing staff will not be able to assist you with questions regarding bills from these companies. ° °You will be contacted with the lab results as soon as they are available. The fastest way to get your results is to activate your My Chart account. Instructions are located on the last page of this paperwork. If you have not heard from us regarding the results in 2 weeks, please contact this office. °  ° ° ° °

## 2020-07-28 NOTE — Progress Notes (Addendum)
Telemedicine Encounter- SOAP NOTE Established Patient  This tvideo encounter was conducted with the patient's (or proxy's) verbal consent via audio telecommunications: yes/no: Yes Patient was instructed to have this encounter in a suitably private space; and to only have persons present to whom they give permission to participate. In addition, patient identity was confirmed by use of name plus two identifiers (DOB and address).  I discussed the limitations, risks, security and privacy concerns of performing an evaluation and management service by telephone and the availability of in person appointments. I also discussed with the patient that there may be a patient responsible charge related to this service. The patient expressed understanding and agreed to proceed.  I spent a total of  23 minutes talking with the patient or their proxy.  Patient at home Provider in office  Participants: Stephen Ruddy, NP and [name], and [name]  Chief Complaint  Patient presents with   Covid Positive    Patient states he needs to discuss home health and payment. Patient states he came to be seen and states he was turned away. He has been dx with heart failure , a fall an admitted and also has covid now. Patient would like orders for home health. He also was retaining fluid and and they have took 13.5 pound of fluid that's come off of patient.    Subjective   [name] is a 87 y.o. established patient. Telephone visit today for home health  HPI Needs home health orders reordered -  Needs face-to-face visit Had come to see Dr. Birdie Riddle on July 8, unfortunately was triaged to ER. Went to ER on 07/11/20, admitted, dc on 07/21/20 Had 15lbs fluid taken off  Fell first night at home, weakness caused fall. Balance issues  Hospital had set up nursing care at home Nursing at home suggested PT and OT   Already has wound care at home and in office coordinated.  His daughter  [name] is on the call and is taking a leave of absence from her job to spend time with him helping him coordinate care. Notes she would like to have social work reach out to ensure all possible resources are being obtained.   Patient Active Problem List   Diagnosis Date Noted   COVID-19 virus infection 07/26/2020   Anasarca    CAP (community acquired pneumonia) 07/12/2020   Acute on chronic diastolic CHF (congestive heart failure) ([place]) 07/12/2020   Pleural effusion due to CHF (congestive heart failure) ([place]) 07/12/2020   Hepatic congestion 07/12/2020   Acute renal failure superimposed on stage 3b chronic kidney disease ([place]) 07/12/2020   Thigh hematoma, right, initial encounter 07/12/2020   CHF (congestive heart failure) ([place]) 07/11/2020   Weight loss 06/18/2020   Age-related cataract of right eye [id number]   Diastolic dysfunction [id number]   Obesity (BMI 30-39.9) 02/28/2018   B12 deficiency 02/28/2018   Chronic constipation 04/29/2015   Soft tissue mass 04/29/2015   Chronic venous insufficiency    Encounter for therapeutic drug monitoring 05/28/2014   Essential hypertension 03/07/2014   Preventative health care 03/07/2014   Varicose veins of lower extremities with other complications 0000000   Diabetes mellitus without complication ([place]) [id number]   Normocytic anemia 08/26/2013   Hemorrhoid 02/19/2008   Motion sickness 01/16/2008   Swelling, mass, or lump in head and neck 11/12/2007   Hyperlipidemia 08/13/2006   GOUT 08/13/2006    Past Medical History:  Diagnosis Date   Abnormal finding on imaging  of liver    Anemia    Aortic insufficiency    Blood transfusion without reported diagnosis    Chronic atrial fibrillation (HCC)    Chronic constipation    colace helps   Chronic diastolic CHF (congestive heart failure) (HCC)    Chronic venous insufficiency    CKD (chronic kidney disease), stage III (HCC)    Diabetes mellitus (HCC)    Gout    Hyperlipidemia     Hypertension    Osteopenia    Stercoral ulcer of rectum    Varicose veins of both lower extremities with complications    Vitamin B12 deficiency     Current Outpatient Medications  Medication Sig Dispense Refill   allopurinol (ZYLOPRIM) 300 MG tablet TAKE 1 TABLET BY MOUTH EVERY DAY 90 tablet 1   apixaban (ELIQUIS) 2.5 MG TABS tablet Take 1 tablet (2.5 mg total) by mouth 2 (two) times daily. 60 tablet 1   Calcium Carbonate-Vit D-Min (CALCIUM 1200 PO) Take by mouth.     metFORMIN (GLUCOPHAGE) 500 MG tablet TAKE 1 TABLET BY MOUTH EVERY DAY 90 tablet 1   metoprolol succinate (TOPROL-XL) 50 MG 24 hr tablet TAKE 1 TABLET BY MOUTH EVERY DAY 90 tablet 1   nirmatrelvir/ritonavir EUA (PAXLOVID) TABS (Take nirmatrelvir 150 mg one tablet twice daily for 5 days and ritonavir 100 mg one tablet twice daily for 5 days) Patient GFR is 39 20 tablet 0   simvastatin (ZOCOR) 20 MG tablet TAKE 1 TABLET BY MOUTH EVERY DAY 90 tablet 1   torsemide 40 MG TABS Take 40 mg by mouth 2 (two) times daily. 60 tablet 0   vitamin B-12 (CYANOCOBALAMIN) 500 MCG tablet Take 500 mcg by mouth daily.     No current facility-administered medications for this visit.    Allergies  Allergen Reactions   Influenza Vac Split [Influenza Virus Vaccine] Nausea And Vomiting    Social History   Socioeconomic History   Marital status: Married    Spouse name: Not on file   Number of children: 3   Years of education: Not on file   Highest education level: Not on file  Occupational History   Occupation: retired  Tobacco Use   Smoking status: Former    Types: Cigarettes   Smokeless tobacco: Never  Scientific laboratory technician Use: Never used  Substance and Sexual Activity   Alcohol use: No   Drug use: No   Sexual activity: Not on file  Other Topics Concern   Not on file  Social History Narrative   Married, 3 daughters, 4 grandchildren.      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   Retired 2012.      Remote smoking hx: quit 55 yrs ago.  No alcohol in 55 yrs.      Social Determinants of Health   Financial Resource Strain: Not on file  Food Insecurity: Not on file  Transportation Needs: Not on file  Physical Activity: Not on file  Stress: Not on file  Social Connections: Not on file  Intimate Partner Violence: Not on file    ROS Per hpi   Objective   Vitals as reported by the patient: There were no vitals filed for this visit.  [place] was seen today for covid positive.  Diagnoses and all orders for this visit:  Fall, initial encounter -     Ambulatory referral to [place] -     Ambulatory  referral to Social Work  Hypervolemia, unspecified hypervolemia type -     Ambulatory referral to [place] referral to Social Work  Diastolic congestive heart failure, unspecified HF chronicity ([place]) -     Ambulatory referral to [place] referral to [facility] discharge follow-up -     Ambulatory referral to [place] -     Ambulatory referral to Social Work  Weakness -     Ambulatory referral to [place] -     Ambulatory referral to [place] health ordered as above. Reasonable to pursue PT, OT, Nursing, Wound Care, and SLP services.  Will order Social work referral as well, follow up prn Plan to resume in office visits as scheduled by Dr. Birdie Riddle  Patient encouraged to call clinic with any questions, comments, or concerns.  I discussed the assessment and treatment plan with the patient. The patient was provided an opportunity to ask questions and all were answered. The patient agreed with the plan and demonstrated an understanding of the instructions.   The patient was advised to call back or seek an in-person evaluation if the symptoms worsen or if the condition fails to improve as anticipated.  I provided 23 minutes of face-to-face time during this  encounter as well as an additional 10 minutes of chart review.   Maximiano Coss, NP

## 2020-07-28 NOTE — Telephone Encounter (Signed)
..  Home Health Verbal Orders  Agency:  [facility]  Caller:Leslie Contact and title   Requesting OT/ PT/ Skilled nursing/ Social Work/ Speech:    Reason for Request:  Magda Paganini is calling in nursing orders, approval of orders and states that Dr. Birdie Riddle will sign when she returns. Frequency:     HH needs F2F w/in last 30 days

## 2020-07-30 NOTE — Telephone Encounter (Signed)
Magda Paganini at [facility] is calling again needing verbal orders on [name] they are unable to send supplies for the would until orders comes through.   Caller: Magda Paganini

## 2020-07-30 NOTE — Telephone Encounter (Signed)
Spoke with Magda Paganini with [facility], verbal order given.

## 2020-07-31 ENCOUNTER — Inpatient Hospital Stay (HOSPITAL_COMMUNITY)
Admission: EM | Admit: 2020-07-31 | Discharge: 2020-08-10 | DRG: 177 | Disposition: A | Discharge status: Skilled Nursing Facility | Payer: Medicare HMO | Attending: Family Medicine | Admitting: Family Medicine

## 2020-07-31 ENCOUNTER — Other Ambulatory Visit: Payer: Self-pay

## 2020-07-31 ENCOUNTER — Emergency Department (HOSPITAL_COMMUNITY): Payer: Medicare HMO

## 2020-07-31 ENCOUNTER — Encounter (HOSPITAL_COMMUNITY): Payer: Self-pay

## 2020-07-31 ENCOUNTER — Observation Stay (HOSPITAL_COMMUNITY): Payer: Medicare HMO

## 2020-07-31 DIAGNOSIS — Z7901 Long term (current) use of anticoagulants: Secondary | ICD-10-CM

## 2020-07-31 DIAGNOSIS — I700 Atherosclerosis of aorta: Secondary | ICD-10-CM | POA: Diagnosis present

## 2020-07-31 DIAGNOSIS — E861 Hypovolemia: Secondary | ICD-10-CM | POA: Diagnosis present

## 2020-07-31 DIAGNOSIS — S7011XA Contusion of right thigh, initial encounter: Secondary | ICD-10-CM | POA: Diagnosis present

## 2020-07-31 DIAGNOSIS — I5032 Chronic diastolic (congestive) heart failure: Secondary | ICD-10-CM | POA: Diagnosis present

## 2020-07-31 DIAGNOSIS — G319 Degenerative disease of nervous system, unspecified: Secondary | ICD-10-CM | POA: Diagnosis not present

## 2020-07-31 DIAGNOSIS — K219 Gastro-esophageal reflux disease without esophagitis: Secondary | ICD-10-CM | POA: Diagnosis present

## 2020-07-31 DIAGNOSIS — T502X5A Adverse effect of carbonic-anhydrase inhibitors, benzothiadiazides and other diuretics, initial encounter: Secondary | ICD-10-CM | POA: Diagnosis present

## 2020-07-31 DIAGNOSIS — Z79899 Other long term (current) drug therapy: Secondary | ICD-10-CM

## 2020-07-31 DIAGNOSIS — Z7401 Bed confinement status: Secondary | ICD-10-CM | POA: Diagnosis not present

## 2020-07-31 DIAGNOSIS — Z741 Need for assistance with personal care: Secondary | ICD-10-CM | POA: Diagnosis not present

## 2020-07-31 DIAGNOSIS — N179 Acute kidney failure, unspecified: Secondary | ICD-10-CM | POA: Diagnosis present

## 2020-07-31 DIAGNOSIS — Z887 Allergy status to serum and vaccine status: Secondary | ICD-10-CM

## 2020-07-31 DIAGNOSIS — E785 Hyperlipidemia, unspecified: Secondary | ICD-10-CM | POA: Diagnosis present

## 2020-07-31 DIAGNOSIS — E1122 Type 2 diabetes mellitus with diabetic chronic kidney disease: Secondary | ICD-10-CM | POA: Diagnosis present

## 2020-07-31 DIAGNOSIS — R2681 Unsteadiness on feet: Secondary | ICD-10-CM | POA: Diagnosis not present

## 2020-07-31 DIAGNOSIS — M47814 Spondylosis without myelopathy or radiculopathy, thoracic region: Secondary | ICD-10-CM | POA: Diagnosis present

## 2020-07-31 DIAGNOSIS — I482 Chronic atrial fibrillation, unspecified: Secondary | ICD-10-CM | POA: Diagnosis present

## 2020-07-31 DIAGNOSIS — R29810 Facial weakness: Secondary | ICD-10-CM | POA: Diagnosis present

## 2020-07-31 DIAGNOSIS — I130 Hypertensive heart and chronic kidney disease with heart failure and stage 1 through stage 4 chronic kidney disease, or unspecified chronic kidney disease: Secondary | ICD-10-CM | POA: Diagnosis present

## 2020-07-31 DIAGNOSIS — Z66 Do not resuscitate: Secondary | ICD-10-CM | POA: Diagnosis present

## 2020-07-31 DIAGNOSIS — R0602 Shortness of breath: Secondary | ICD-10-CM | POA: Diagnosis not present

## 2020-07-31 DIAGNOSIS — K449 Diaphragmatic hernia without obstruction or gangrene: Secondary | ICD-10-CM | POA: Diagnosis not present

## 2020-07-31 DIAGNOSIS — M6281 Muscle weakness (generalized): Secondary | ICD-10-CM | POA: Diagnosis not present

## 2020-07-31 DIAGNOSIS — U071 COVID-19: Principal | ICD-10-CM | POA: Diagnosis present

## 2020-07-31 DIAGNOSIS — R278 Other lack of coordination: Secondary | ICD-10-CM | POA: Diagnosis not present

## 2020-07-31 DIAGNOSIS — J90 Pleural effusion, not elsewhere classified: Secondary | ICD-10-CM | POA: Diagnosis not present

## 2020-07-31 DIAGNOSIS — N1832 Chronic kidney disease, stage 3b: Secondary | ICD-10-CM | POA: Diagnosis present

## 2020-07-31 DIAGNOSIS — Z8249 Family history of ischemic heart disease and other diseases of the circulatory system: Secondary | ICD-10-CM

## 2020-07-31 DIAGNOSIS — I2510 Atherosclerotic heart disease of native coronary artery without angina pectoris: Secondary | ICD-10-CM | POA: Diagnosis present

## 2020-07-31 DIAGNOSIS — R296 Repeated falls: Secondary | ICD-10-CM | POA: Diagnosis present

## 2020-07-31 DIAGNOSIS — E872 Acidosis: Secondary | ICD-10-CM | POA: Diagnosis present

## 2020-07-31 DIAGNOSIS — R4182 Altered mental status, unspecified: Secondary | ICD-10-CM | POA: Diagnosis not present

## 2020-07-31 DIAGNOSIS — H5500 Unspecified nystagmus: Secondary | ICD-10-CM | POA: Diagnosis not present

## 2020-07-31 DIAGNOSIS — R7303 Prediabetes: Secondary | ICD-10-CM

## 2020-07-31 DIAGNOSIS — E860 Dehydration: Secondary | ICD-10-CM | POA: Diagnosis present

## 2020-07-31 DIAGNOSIS — M8580 Other specified disorders of bone density and structure, unspecified site: Secondary | ICD-10-CM | POA: Diagnosis present

## 2020-07-31 DIAGNOSIS — I951 Orthostatic hypotension: Secondary | ICD-10-CM | POA: Diagnosis present

## 2020-07-31 DIAGNOSIS — J1282 Pneumonia due to coronavirus disease 2019: Secondary | ICD-10-CM | POA: Diagnosis present

## 2020-07-31 DIAGNOSIS — M109 Gout, unspecified: Secondary | ICD-10-CM | POA: Diagnosis present

## 2020-07-31 DIAGNOSIS — I10 Essential (primary) hypertension: Secondary | ICD-10-CM | POA: Diagnosis present

## 2020-07-31 DIAGNOSIS — I6523 Occlusion and stenosis of bilateral carotid arteries: Secondary | ICD-10-CM | POA: Diagnosis not present

## 2020-07-31 DIAGNOSIS — E538 Deficiency of other specified B group vitamins: Secondary | ICD-10-CM | POA: Diagnosis present

## 2020-07-31 DIAGNOSIS — Z833 Family history of diabetes mellitus: Secondary | ICD-10-CM

## 2020-07-31 DIAGNOSIS — K5730 Diverticulosis of large intestine without perforation or abscess without bleeding: Secondary | ICD-10-CM | POA: Diagnosis not present

## 2020-07-31 DIAGNOSIS — R001 Bradycardia, unspecified: Secondary | ICD-10-CM | POA: Diagnosis not present

## 2020-07-31 DIAGNOSIS — Z87891 Personal history of nicotine dependence: Secondary | ICD-10-CM

## 2020-07-31 DIAGNOSIS — R41841 Cognitive communication deficit: Secondary | ICD-10-CM | POA: Diagnosis not present

## 2020-07-31 DIAGNOSIS — R410 Disorientation, unspecified: Secondary | ICD-10-CM | POA: Diagnosis not present

## 2020-07-31 DIAGNOSIS — E119 Type 2 diabetes mellitus without complications: Secondary | ICD-10-CM

## 2020-07-31 DIAGNOSIS — W19XXXA Unspecified fall, initial encounter: Secondary | ICD-10-CM | POA: Diagnosis present

## 2020-07-31 DIAGNOSIS — R404 Transient alteration of awareness: Secondary | ICD-10-CM | POA: Diagnosis not present

## 2020-07-31 DIAGNOSIS — G9341 Metabolic encephalopathy: Secondary | ICD-10-CM | POA: Diagnosis present

## 2020-07-31 DIAGNOSIS — F05 Delirium due to known physiological condition: Secondary | ICD-10-CM | POA: Diagnosis present

## 2020-07-31 DIAGNOSIS — R531 Weakness: Secondary | ICD-10-CM | POA: Diagnosis not present

## 2020-07-31 DIAGNOSIS — Z7984 Long term (current) use of oral hypoglycemic drugs: Secondary | ICD-10-CM

## 2020-07-31 DIAGNOSIS — K5909 Other constipation: Secondary | ICD-10-CM | POA: Diagnosis present

## 2020-07-31 DIAGNOSIS — I959 Hypotension, unspecified: Secondary | ICD-10-CM | POA: Diagnosis not present

## 2020-07-31 DIAGNOSIS — I4891 Unspecified atrial fibrillation: Secondary | ICD-10-CM | POA: Diagnosis not present

## 2020-07-31 DIAGNOSIS — R1312 Dysphagia, oropharyngeal phase: Secondary | ICD-10-CM | POA: Diagnosis not present

## 2020-07-31 LAB — CBC
HCT: 33 % — ABNORMAL LOW (ref 39.0–52.0)
Hemoglobin: 10.5 g/dL — ABNORMAL LOW (ref 13.0–17.0)
MCH: 31.3 pg (ref 26.0–34.0)
MCHC: 31.8 g/dL (ref 30.0–36.0)
MCV: 98.2 fL (ref 80.0–100.0)
Platelets: 235 10*3/uL (ref 150–400)
RBC: 3.36 MIL/uL — ABNORMAL LOW (ref 4.22–5.81)
RDW: 17.5 % — ABNORMAL HIGH (ref 11.5–15.5)
WBC: 5.4 10*3/uL (ref 4.0–10.5)
nRBC: 0 % (ref 0.0–0.2)

## 2020-07-31 LAB — BASIC METABOLIC PANEL
Anion gap: 9 (ref 5–15)
BUN: 46 mg/dL — ABNORMAL HIGH (ref 8–23)
CO2: 32 mmol/L (ref 22–32)
Calcium: 9.8 mg/dL (ref 8.9–10.3)
Chloride: 97 mmol/L — ABNORMAL LOW (ref 98–111)
Creatinine, Ser: 2.3 mg/dL — ABNORMAL HIGH (ref 0.61–1.24)
GFR, Estimated: 27 mL/min — ABNORMAL LOW (ref >60)
Glucose, Bld: 117 mg/dL — ABNORMAL HIGH (ref 70–99)
Potassium: 3.5 mmol/L (ref 3.5–5.1)
Sodium: 138 mmol/L (ref 135–145)

## 2020-07-31 LAB — CBC WITH DIFFERENTIAL/PLATELET
Abs Immature Granulocytes: 0.04 10*3/uL (ref 0.00–0.07)
Basophils Absolute: 0 10*3/uL (ref 0.0–0.1)
Basophils Relative: 1 %
Eosinophils Absolute: 0.1 10*3/uL (ref 0.0–0.5)
Eosinophils Relative: 1 %
HCT: 34 % — ABNORMAL LOW (ref 39.0–52.0)
Hemoglobin: 10.8 g/dL — ABNORMAL LOW (ref 13.0–17.0)
Immature Granulocytes: 1 %
Lymphocytes Relative: 24 %
Lymphs Abs: 1.2 10*3/uL (ref 0.7–4.0)
MCH: 30.9 pg (ref 26.0–34.0)
MCHC: 31.8 g/dL (ref 30.0–36.0)
MCV: 97.1 fL (ref 80.0–100.0)
Monocytes Absolute: 1 10*3/uL (ref 0.1–1.0)
Monocytes Relative: 19 %
Neutro Abs: 2.9 10*3/uL (ref 1.7–7.7)
Neutrophils Relative %: 54 %
Platelets: 227 10*3/uL (ref 150–400)
RBC: 3.5 MIL/uL — ABNORMAL LOW (ref 4.22–5.81)
RDW: 17.6 % — ABNORMAL HIGH (ref 11.5–15.5)
WBC: 5.2 10*3/uL (ref 4.0–10.5)
nRBC: 0 % (ref 0.0–0.2)

## 2020-07-31 LAB — HEPATIC FUNCTION PANEL
ALT: 11 U/L (ref 0–44)
AST: 22 U/L (ref 15–41)
Albumin: 2.7 g/dL — ABNORMAL LOW (ref 3.5–5.0)
Alkaline Phosphatase: 79 U/L (ref 38–126)
Bilirubin, Direct: 0.7 mg/dL — ABNORMAL HIGH (ref 0.0–0.2)
Indirect Bilirubin: 1 mg/dL — ABNORMAL HIGH (ref 0.3–0.9)
Total Bilirubin: 1.7 mg/dL — ABNORMAL HIGH (ref 0.3–1.2)
Total Protein: 6.4 g/dL — ABNORMAL LOW (ref 6.5–8.1)

## 2020-07-31 LAB — D-DIMER, QUANTITATIVE: D-Dimer, Quant: 2.52 ug/mL-FEU — ABNORMAL HIGH (ref 0.00–0.50)

## 2020-07-31 LAB — URINALYSIS, ROUTINE W REFLEX MICROSCOPIC
Bilirubin Urine: NEGATIVE
Glucose, UA: NEGATIVE mg/dL
Hgb urine dipstick: NEGATIVE
Ketones, ur: NEGATIVE mg/dL
Leukocytes,Ua: NEGATIVE
Nitrite: NEGATIVE
Protein, ur: NEGATIVE mg/dL
Specific Gravity, Urine: 1.009 (ref 1.005–1.030)
pH: 7 (ref 5.0–8.0)

## 2020-07-31 LAB — RESP PANEL BY RT-PCR (FLU A&B, COVID) ARPGX2
Influenza A by PCR: NEGATIVE
Influenza B by PCR: NEGATIVE
SARS Coronavirus 2 by RT PCR: POSITIVE — AB

## 2020-07-31 LAB — TROPONIN I (HIGH SENSITIVITY)
Troponin I (High Sensitivity): 23 ng/L — ABNORMAL HIGH (ref <18)
Troponin I (High Sensitivity): 22 ng/L — ABNORMAL HIGH (ref <18)

## 2020-07-31 LAB — LACTIC ACID, PLASMA
Lactic Acid, Venous: 2.1 mmol/L (ref 0.5–1.9)
Lactic Acid, Venous: 1.8 mmol/L (ref 0.5–1.9)

## 2020-07-31 LAB — C-REACTIVE PROTEIN: CRP: 0.7 mg/dL (ref <1.0)

## 2020-07-31 LAB — AMMONIA: Ammonia: 33 umol/L (ref 9–35)

## 2020-07-31 LAB — TSH: TSH: 1.434 u[IU]/mL (ref 0.350–4.500)

## 2020-07-31 LAB — T4, FREE: Free T4: 1.33 ng/dL — ABNORMAL HIGH (ref 0.61–1.12)

## 2020-07-31 LAB — BRAIN NATRIURETIC PEPTIDE: B Natriuretic Peptide: 521.2 pg/mL — ABNORMAL HIGH (ref 0.0–100.0)

## 2020-07-31 MED ORDER — VITAMIN B-12 1000 MCG PO TABS
500.0000 ug | ORAL_TABLET | Freq: Every day | ORAL | Status: DC
  Administered 2020-08-01 – 2020-08-10 (×10): 500 ug via ORAL
  Filled 2020-07-31 (×10): qty 1

## 2020-07-31 MED ORDER — SIMVASTATIN 20 MG PO TABS
20.0000 mg | ORAL_TABLET | Freq: Every day | ORAL | Status: DC
  Administered 2020-08-01 – 2020-08-10 (×9): 20 mg via ORAL
  Filled 2020-07-31 (×9): qty 1

## 2020-07-31 MED ORDER — HYDROCOD POLST-CPM POLST ER 10-8 MG/5ML PO SUER: 5.0000 mL | Freq: Two times a day (BID) | ORAL | Status: DC | PRN

## 2020-07-31 MED ORDER — SODIUM CHLORIDE 0.9 % IV SOLN
100.0000 mg | Freq: Every day | INTRAVENOUS | Status: AC
  Administered 2020-08-01 – 2020-08-02 (×2): 100 mg via INTRAVENOUS
  Filled 2020-07-31 (×4): qty 20

## 2020-07-31 MED ORDER — ONDANSETRON HCL 4 MG/2ML IJ SOLN: 4.0000 mg | Freq: Four times a day (QID) | INTRAMUSCULAR | Status: DC | PRN

## 2020-07-31 MED ORDER — SODIUM CHLORIDE 0.9 % IV SOLN
200.0000 mg | Freq: Once | INTRAVENOUS | Status: AC
  Administered 2020-07-31: 200 mg via INTRAVENOUS
  Filled 2020-07-31: qty 40

## 2020-07-31 MED ORDER — IOHEXOL 9 MG/ML PO SOLN
500.0000 mL | ORAL | Status: AC
  Administered 2020-07-31: 500 mL via ORAL

## 2020-07-31 MED ORDER — ALBUTEROL SULFATE HFA 108 (90 BASE) MCG/ACT IN AERS
2.0000 | INHALATION_SPRAY | Freq: Four times a day (QID) | RESPIRATORY_TRACT | Status: DC
  Administered 2020-07-31 – 2020-08-10 (×28): 2 via RESPIRATORY_TRACT
  Filled 2020-07-31: qty 6.7

## 2020-07-31 MED ORDER — ONDANSETRON HCL 4 MG PO TABS: 4.0000 mg | ORAL_TABLET | Freq: Four times a day (QID) | ORAL | Status: DC | PRN

## 2020-07-31 MED ORDER — SODIUM CHLORIDE 0.9 % IV SOLN: INTRAVENOUS | Status: DC

## 2020-07-31 MED ORDER — GUAIFENESIN-DM 100-10 MG/5ML PO SYRP: 10.0000 mL | ORAL_SOLUTION | ORAL | Status: DC | PRN

## 2020-07-31 MED ORDER — BISACODYL 10 MG RE SUPP
10.0000 mg | Freq: Every day | RECTAL | Status: DC | PRN
  Filled 2020-07-31: qty 1

## 2020-07-31 MED ORDER — ACETAMINOPHEN 325 MG PO TABS
650.0000 mg | ORAL_TABLET | Freq: Four times a day (QID) | ORAL | Status: DC | PRN
  Administered 2020-08-07: 650 mg via ORAL
  Filled 2020-07-31: qty 2

## 2020-07-31 MED ORDER — SODIUM CHLORIDE 0.9 % IV SOLN: INTRAVENOUS | Status: DC | PRN

## 2020-07-31 MED ORDER — LORAZEPAM 1 MG PO TABS
0.5000 mg | ORAL_TABLET | Freq: Once | ORAL | Status: AC
  Administered 2020-07-31: 0.5 mg via ORAL
  Filled 2020-07-31 (×2): qty 1

## 2020-07-31 MED ORDER — METOPROLOL SUCCINATE ER 25 MG PO TB24: 50.0000 mg | ORAL_TABLET | Freq: Every day | ORAL | Status: DC

## 2020-07-31 MED ORDER — APIXABAN 2.5 MG PO TABS
2.5000 mg | ORAL_TABLET | Freq: Two times a day (BID) | ORAL | Status: DC
  Administered 2020-07-31 – 2020-08-10 (×20): 2.5 mg via ORAL
  Filled 2020-07-31 (×21): qty 1

## 2020-07-31 MED ORDER — POLYETHYLENE GLYCOL 3350 17 G PO PACK
17.0000 g | PACK | Freq: Every day | ORAL | Status: DC
  Administered 2020-08-01 – 2020-08-10 (×10): 17 g via ORAL
  Filled 2020-07-31 (×10): qty 1

## 2020-07-31 MED ORDER — ALLOPURINOL 100 MG PO TABS
300.0000 mg | ORAL_TABLET | Freq: Every day | ORAL | Status: DC
  Administered 2020-08-01 – 2020-08-10 (×10): 300 mg via ORAL
  Filled 2020-07-31 (×10): qty 3

## 2020-07-31 MED ORDER — FLEET ENEMA 7-19 GM/118ML RE ENEM
1.0000 | ENEMA | Freq: Once | RECTAL | Status: DC | PRN
  Filled 2020-07-31: qty 1

## 2020-07-31 MED ORDER — IOHEXOL 9 MG/ML PO SOLN
ORAL | Status: AC
  Filled 2020-07-31: qty 1000

## 2020-07-31 NOTE — Consult Note (Signed)
WOC Nurse Consult Note: Reason for Consult: Large skin tear to upper right arm. Previous skin tear to left elbow area Wound type:trauma (fall) Pressure Injury POA: N/A Measurement:To be obtained by Bedside RN and documented on Nursing Flow Sheet (LxWxD in cm) Wound bed:red, moist Drainage (amount, consistency, odor) serosanguinous in a small amount Periwound:intact, ecchymotic Dressing procedure/placement/frequency: I have provided Nursing with guidance via the Orders for topical care of skin tears for atraumatic dressing changes using folded xeroform gauze (antimicrobial, nonadherent). This will be secured with a kerlix roll gauze and paper tape-placing no tape on skin.  [place] nursing team will not follow, but will remain available to this patient, the nursing and medical teams.  Please re-consult if needed. Thanks, Maudie Flakes, MSN, RN, [place], Arther Abbott  Pager# [phone]

## 2020-07-31 NOTE — ED Triage Notes (Signed)
Pt BIB EMS for lethargy and weakness. Pt in afib with HR was in the 20s with EMS. Pt recovered from [place] recently. BGL 169. HX of DM,CHF. Pt also confused for last 3 days.

## 2020-07-31 NOTE — ED Notes (Signed)
Patient transported to CT 

## 2020-07-31 NOTE — ED Provider Notes (Signed)
[facility] EMERGENCY DEPARTMENT Provider Note   CSN: [id number] Arrival date & time: 07/31/20  1038     History Chief Complaint  Patient presents with   Bradycardia    [name] is a 87 y.o. male with PMH of HFpEF (EF 60-65%), a fib on eliquis, HTN, DM2, presented for new onset weakness and confusion. Patient states he is here because he was told to see someone for his heart. He was not quite sure of what exactly brought him in. Called his daughter [name] who lives with patient and provided more history over the phone. She reports patient has had a lot going on in the past 2 weeks. States on June 10th admitted to [facility] with excess fluid retention and decrease in kidney function, stating they removed about 13 pounds of fluid .Thinks he got COVID while hospitalized. Tested positive for COVID Sunday 7/24. Daughter states patient fell in the bathroom after he came home from the hospital and hurt his arm, unsure if he hit his head. Nancy FetterBeatris Ship was fine. Since Wed he has had a complete 180 and has been lethargic and just has been sleeping often and has seemed more confused and weak. States normally does not have a slow, slurred speech- has been happening the past few days. Additionally he complains of chronic constipation and is requesting something to help him have a BM.   Daughter states at last hospitalization cardiologist changed Warfarin to Eliquis and increased Lasix   [name] (daughter)  [phone]     Past Medical History:  Diagnosis Date   Abnormal finding on imaging of liver    Anemia    Aortic insufficiency    Blood transfusion without reported diagnosis    Chronic atrial fibrillation (HCC)    Chronic constipation    colace helps   Chronic diastolic CHF (congestive heart failure) ([place])    Chronic venous insufficiency    CKD (chronic kidney disease), stage III ([place])    Diabetes mellitus ([place])    Gout    Hyperlipidemia    Hypertension     Osteopenia    Stercoral ulcer of rectum    Varicose veins of both lower extremities with complications    Vitamin B12 deficiency     Patient Active Problem List   Diagnosis Date Noted   COVID-19 virus infection 07/26/2020   Anasarca    CAP (community acquired pneumonia) 07/12/2020   Acute on chronic diastolic CHF (congestive heart failure) ([place]) 07/12/2020   Pleural effusion due to CHF (congestive heart failure) ([place]) 07/12/2020   Hepatic congestion 07/12/2020   Acute renal failure superimposed on stage 3b chronic kidney disease ([place]) 07/12/2020   Thigh hematoma, right, initial encounter 07/12/2020   CHF (congestive heart failure) ([place]) 07/11/2020   Weight loss 06/18/2020   Age-related cataract of right eye [id number]   Diastolic dysfunction [id number]   Obesity (BMI 30-39.9) 02/28/2018   B12 deficiency 02/28/2018   Chronic constipation 04/29/2015   Soft tissue mass 04/29/2015   Chronic venous insufficiency    Encounter for therapeutic drug monitoring 05/28/2014   Essential hypertension 03/07/2014   Preventative health care 03/07/2014   Varicose veins of lower extremities with other complications 0000000   Diabetes mellitus without complication ([place]) [id number]   Normocytic anemia 08/26/2013   Hemorrhoid 02/19/2008   Motion sickness 01/16/2008   Swelling, mass, or lump in head and neck 11/12/2007   Hyperlipidemia 08/13/2006   GOUT 08/13/2006    Past Surgical History:  Procedure Laterality  Date   BUNIONECTOMY  2006   COLONOSCOPY N/A 01/22/2016   Procedure: COLONOSCOPY;  Surgeon: Manus Gunning, MD;  Location: [facility] ENDOSCOPY;  Service: Gastroenterology;  Laterality: N/A;   HEMORRHOID SURGERY  1970   TONSILLECTOMY AND ADENOIDECTOMY  1945       Family History  Problem Relation Age of Onset   Alcohol abuse Father    Diabetes Father    Heart disease Father    Obesity Brother    Obesity Daughter        S/P gastric bypass    Social History   Tobacco Use    Smoking status: Former    Types: Cigarettes   Smokeless tobacco: Never  Vaping Use   Vaping Use: Never used  Substance Use Topics   Alcohol use: No   Drug use: No    Home Medications Prior to Admission medications   Medication Sig Start Date End Date Taking? Authorizing Provider  allopurinol (ZYLOPRIM) 300 MG tablet TAKE 1 TABLET BY MOUTH EVERY DAY 07/28/20   Midge Minium, MD  apixaban (ELIQUIS) 2.5 MG TABS tablet Take 1 tablet (2.5 mg total) by mouth 2 (two) times daily. 07/21/20 09/19/20  Antonieta Pert, MD  Calcium Carbonate-Vit D-Min (CALCIUM 1200 PO) Take by mouth.    [provider]  metFORMIN (GLUCOPHAGE) 500 MG tablet TAKE 1 TABLET BY MOUTH EVERY DAY 02/04/20   Midge Minium, MD  metoprolol succinate (TOPROL-XL) 50 MG 24 hr tablet TAKE 1 TABLET BY MOUTH EVERY DAY 02/23/20   Midge Minium, MD  nirmatrelvir/ritonavir EUA (PAXLOVID) TABS (Take nirmatrelvir 150 mg one tablet twice daily for 5 days and ritonavir 100 mg one tablet twice daily for 5 days) Patient GFR is 39 07/26/20   Laurey Morale, MD  simvastatin (ZOCOR) 20 MG tablet TAKE 1 TABLET BY MOUTH EVERY DAY 02/26/20   Midge Minium, MD  torsemide 40 MG TABS Take 40 mg by mouth 2 (two) times daily. 07/21/20 08/20/20  Antonieta Pert, MD  vitamin B-12 (CYANOCOBALAMIN) 500 MCG tablet Take 500 mcg by mouth daily.    [provider]    Allergies    Influenza vac split [influenza virus vaccine]  Review of Systems   Review of Systems  Gastrointestinal:  Positive for constipation.  Neurological:  Positive for weakness.  Psychiatric/Behavioral:  Positive for confusion.   All other systems reviewed and are negative.  Physical Exam Updated Vital Signs BP 118/78   Pulse 64   Temp 98.3 F (36.8 C) (Oral)   Resp 15   SpO2 96%   Physical Exam Constitutional:      General: He is not in acute distress.    Appearance: He is ill-appearing.  HENT:     Head: Normocephalic and atraumatic.  Eyes:      Extraocular Movements: Extraocular movements intact.     Conjunctiva/sclera: Conjunctivae normal.  Cardiovascular:     Rate and Rhythm: Normal rate and regular rhythm.     Pulses: Normal pulses.     Heart sounds: Normal heart sounds.  Pulmonary:     Effort: Pulmonary effort is normal.     Breath sounds: Rhonchi present.  Abdominal:     Palpations: Abdomen is soft.     Tenderness: There is abdominal tenderness.  Skin:    General: Skin is warm and dry.  Neurological:     Mental Status: He is oriented to person, place, and time.     Sensory: No sensory deficit.  Comments: L sided facial droop     ED Results / Procedures / Treatments   Labs (all labs ordered are listed, but only abnormal results are displayed) Labs Reviewed  RESP PANEL BY RT-PCR (FLU A&B, COVID) ARPGX2 - Abnormal; Notable for the following components:      Result Value   SARS Coronavirus 2 by RT PCR POSITIVE (*)    All other components within normal limits  BASIC METABOLIC PANEL - Abnormal; Notable for the following components:   Chloride 97 (*)    Glucose, Bld 117 (*)    BUN 46 (*)    Creatinine, Ser 2.30 (*)    GFR, Estimated 27 (*)    All other components within normal limits  CBC - Abnormal; Notable for the following components:   RBC 3.36 (*)    Hemoglobin 10.5 (*)    HCT 33.0 (*)    RDW 17.5 (*)    All other components within normal limits  HEPATIC FUNCTION PANEL - Abnormal; Notable for the following components:   Total Protein 6.4 (*)    Albumin 2.7 (*)    Total Bilirubin 1.7 (*)    Bilirubin, Direct 0.7 (*)    Indirect Bilirubin 1.0 (*)    All other components within normal limits  LACTIC ACID, PLASMA - Abnormal; Notable for the following components:   Lactic Acid, Venous 2.1 (*)    All other components within normal limits  BRAIN NATRIURETIC PEPTIDE - Abnormal; Notable for the following components:   B Natriuretic Peptide 521.2 (*)    All other components within normal limits  T4, FREE -  Abnormal; Notable for the following components:   Free T4 1.33 (*)    All other components within normal limits  CBC WITH DIFFERENTIAL/PLATELET - Abnormal; Notable for the following components:   RBC 3.50 (*)    Hemoglobin 10.8 (*)    HCT 34.0 (*)    RDW 17.6 (*)    All other components within normal limits  TROPONIN I (HIGH SENSITIVITY) - Abnormal; Notable for the following components:   Troponin I (High Sensitivity) 23 (*)    All other components within normal limits  URINE CULTURE  LACTIC ACID, PLASMA  URINALYSIS, ROUTINE W REFLEX MICROSCOPIC  AMMONIA  TSH  DIFFERENTIAL  TROPONIN I (HIGH SENSITIVITY)    EKG None  Radiology CT Head Wo Contrast  Result Date: 07/31/2020 CLINICAL DATA:  Mental status change. EXAM: CT HEAD WITHOUT CONTRAST TECHNIQUE: Contiguous axial images were obtained from the base of the skull through the vertex without intravenous contrast. COMPARISON:  None. FINDINGS: Brain: Ventricles and sulci are mildly prominent compatible with atrophy. Periventricular and subcortical white matter hypodensity compatible with chronic microvascular ischemic changes. No evidence for acute cortically based infarct, intracranial hemorrhage, mass lesion or mass-effect. Vascular: Unremarkable Skull: Intact. Sinuses/Orbits: Mild mucosal thickening ethmoid air cells. Remainder the paranasal sinuses unremarkable. Mastoid air cells unremarkable. Orbits unremarkable. Other: None. IMPRESSION: No acute intracranial process. Electronically Signed   By: Lovey Newcomer M.D.   On: 07/31/2020 12:31   DG Chest Portable 1 View  Result Date: 07/31/2020 CLINICAL DATA:  Shortness of breath.  Lethargy and weakness. EXAM: PORTABLE CHEST 1 VIEW COMPARISON:  07/11/2020 FINDINGS: Small to moderate-sized left pleural effusion, decreased. Patchy and linear density in the aerated portion of the left mid and lower lung zone. Minimal linear atelectasis or scarring at the right lung base. No visible right pleural  fluid. Grossly stable borderline enlarged cardiac silhouette and tortuous calcified thoracic  aorta. Thoracic spine degenerative changes. IMPRESSION: Decreased left pleural fluid with persistent atelectasis/pneumonia in the left mid and lower lung zones. Electronically Signed   By: Claudie Revering M.D.   On: 07/31/2020 13:38    Procedures Procedures   Medications Ordered in ED Medications  LORazepam (ATIVAN) tablet 0.5 mg (has no administration in time range)  iohexol (OMNIPAQUE) 9 MG/ML oral solution 500 mL (500 mLs Oral Contrast Given 07/31/20 1454)    ED Course  I have reviewed the triage vital signs and the nursing notes.  Pertinent labs & imaging results that were available during my care of the patient were reviewed by me and considered in my medical decision making (see chart for details).    MDM Rules/Calculators/A&P                           Patient is a 87 yo with PMH of HFpEF (EF 60-65%), a fib on eliquis, HTN, DM2, who presents with new onset confusion, weakness, slow speech, facial droop, and was found to be bradycardic to the 20s on the cardiac monitor with EMS.  On presentation vitals stable, lungs with diffuse crackles. He has a facial droop, otherwise exam unremarkable.  Symptoms could be due to heart failure exacerbation, dehydration from not being able to eat or drink much, his COVID infection could also be contributing  CT head without any acute abnormality  MRI pending   COVID-19 + Albumin 2.7, tbili 1.7 BNP 521, trop 23, LA 2.1 UA normal  Free T4 1.33   Signed out to night ED provider   Final Clinical Impression(s) / ED Diagnoses Final diagnoses:  None    Rx / DC Orders ED Discharge Orders     None        Shary Key, DO 07/31/20 1553    Gareth Morgan, MD 07/31/20 2341

## 2020-07-31 NOTE — H&P (Signed)
History and Physical    [name] [id number] DOB: 1931-12-27 DOA: 07/31/2020  PCP: Midge Minium, MD   Patient coming from: Home  Chief Complaint  Patient presents with   Bradycardia     HPI: IAM SENDER is a 87 y.o. male with medical history significant for chronic diastolic CHF, CKD stage IIIb baselien creat 1.4-1.5, hypertension, HLD, obesity,type 2 diabetes mellitus, chronic venous insufficiency, A. Fib, recent admission 7/10-/720 for acute on chronic diastolic CHF, right thigh hematoma while on Coumadin> wt on d/c 214 lb, 233 lb on admission, syndrome on Eliquis, torsemide brought to the hospital due to progressive generalized weakness lethargy. Upon his discharge patient started to have weakness, difficulty getting up, sometimes confused, constipation He tested positive for COVID on 07/25/20.  No report of shortness of breath hypoxia fever focal weakness.  Apparently  EMS reported bradycardic to the 20s on the cardiac monitor but unable to capture/no records available.  ED Course: Hemodynamically stable, saturating well on room air, tested positive for COVID-19 negative, BNP 521 troponin 23 mild lactic acidosis that resolved to 1.2, UA fairly normal, TSH 1.4.  Chest x-ray showed decreased left pleural fluid with persistent atelectasis/pneumonia in the left mid and lower lung zones.  Patient was complaining of chronic constipation and CT abdomen pelvis was done that showed-renal acute finding, left effusion with underlying opacity identified right effusion resolved.  There is question of facial droop CT head was unremarkable MRI head pending.  Labs showed AKI worse from baseline admission has been requested for further management.  Review of Systems: All systems were reviewed and were negative except as mentioned in HPI above. Negative for fever Negative for chest pain Negative for shortness of breath  Past Medical History:  Diagnosis Date   Abnormal finding on imaging of  liver    Anemia    Aortic insufficiency    Blood transfusion without reported diagnosis    Chronic atrial fibrillation (HCC)    Chronic constipation    colace helps   Chronic diastolic CHF (congestive heart failure) (HCC)    Chronic venous insufficiency    CKD (chronic kidney disease), stage III (HCC)    Diabetes mellitus ([place])    Gout    Hyperlipidemia    Hypertension    Osteopenia    Stercoral ulcer of rectum    Varicose veins of both lower extremities with complications    Vitamin B12 deficiency     Past Surgical History:  Procedure Laterality Date   BUNIONECTOMY  2006   COLONOSCOPY N/A 01/22/2016   Procedure: COLONOSCOPY;  Surgeon: Manus Gunning, MD;  Location: [facility];  Service: Gastroenterology;  Laterality: N/A;   [place]     reports that he has quit smoking. His smoking use included cigarettes. He has never used smokeless tobacco. He reports that he does not drink alcohol and does not use drugs.  Allergies  Allergen Reactions   Influenza Vac Split [Influenza Virus Vaccine] Nausea And Vomiting    Family History  Problem Relation Age of Onset   Alcohol abuse Father    Diabetes Father    Heart disease Father    Obesity Brother    Obesity Daughter        S/P gastric bypass     Prior to Admission medications   Medication Sig Start Date End Date Taking? Authorizing Provider  allopurinol (ZYLOPRIM) 300 MG tablet TAKE 1 TABLET BY MOUTH EVERY DAY  07/28/20   Midge Minium, MD  apixaban (ELIQUIS) 2.5 MG TABS tablet Take 1 tablet (2.5 mg total) by mouth 2 (two) times daily. 07/21/20 09/19/20  Antonieta Pert, MD  Calcium Carbonate-Vit D-Min (CALCIUM 1200 PO) Take by mouth.    [provider]  metFORMIN (GLUCOPHAGE) 500 MG tablet TAKE 1 TABLET BY MOUTH EVERY DAY 02/04/20   Midge Minium, MD  metoprolol succinate (TOPROL-XL) 50 MG 24 hr tablet TAKE 1 TABLET BY MOUTH EVERY DAY 02/23/20    Midge Minium, MD  nirmatrelvir/ritonavir EUA (PAXLOVID) TABS (Take nirmatrelvir 150 mg one tablet twice daily for 5 days and ritonavir 100 mg one tablet twice daily for 5 days) Patient GFR is 39 07/26/20   Laurey Morale, MD  simvastatin (ZOCOR) 20 MG tablet TAKE 1 TABLET BY MOUTH EVERY DAY 02/26/20   Midge Minium, MD  torsemide 40 MG TABS Take 40 mg by mouth 2 (two) times daily. 07/21/20 08/20/20  Antonieta Pert, MD  vitamin B-12 (CYANOCOBALAMIN) 500 MCG tablet Take 500 mcg by mouth daily.    [provider]    Physical Exam: Vitals:   07/31/20 1630 07/31/20 1645 07/31/20 1700 07/31/20 1721  BP: (!) 142/52 (!) 129/50 (!) 128/51 (!) 142/66  Pulse: 64 63 66 64  Resp: '17 16 17 16  '$ Temp:    (!) 97.1 F (36.2 C)  TempSrc:    Oral  SpO2: 98% 94% 96% 96%    General exam: AAOx3, weak ,elderly, HEENT:Oral mucosa moist, Ear/Nose WNL grossly, dentition normal. Respiratory system: bilaterally clear,no wheezing or crackles,no use of accessory muscle Cardiovascular system: S1 & S2 +, No JVD,. Gastrointestinal system: Abdomen soft, NT,ND, BS+ Nervous System:Alert, awake, moving extremities and grossly nonfocal Extremities: Significantly improved leg edema , distal peripheral pulses palpable.  Skin: No rashes,no icterus. MSK: Normal muscle bulk,tone, power  Right upper extremity wound with dressing in place Labs on Admission: I have personally reviewed following labs and imaging studies  CBC: Recent Labs  Lab 07/31/20 1045 07/31/20 1251  WBC 5.4 5.2  NEUTROABS  --  2.9  HGB 10.5* 10.8*  HCT 33.0* 34.0*  MCV 98.2 97.1  PLT 235 [id number]   Basic Metabolic Panel: Recent Labs  Lab 07/31/20 1045  NA 138  K 3.5  CL 97*  CO2 32  GLUCOSE 117*  BUN 46*  CREATININE 2.30*  CALCIUM 9.8   GFR: Estimated Creatinine Clearance: 24.1 mL/min (A) (by C-G formula based on SCr of 2.3 mg/dL (H)). Liver Function Tests: Recent Labs  Lab 07/31/20 1251  AST 22  ALT 11  ALKPHOS 79   BILITOT 1.7*  PROT 6.4*  ALBUMIN 2.7*   No results for input(s): LIPASE, AMYLASE in the last 168 hours. Recent Labs  Lab 07/31/20 1251  AMMONIA 33   Coagulation Profile: No results for input(s): INR, PROTIME in the last 168 hours. Cardiac Enzymes: No results for input(s): CKTOTAL, CKMB, CKMBINDEX, TROPONINI in the last 168 hours. BNP (last 3 results) No results for input(s): PROBNP in the last 8760 hours. HbA1C: No results for input(s): HGBA1C in the last 72 hours. CBG: No results for input(s): GLUCAP in the last 168 hours. Lipid Profile: No results for input(s): CHOL, HDL, LDLCALC, TRIG, CHOLHDL, LDLDIRECT in the last 72 hours. Thyroid Function Tests: Recent Labs    07/31/20 1251  TSH 1.434  FREET4 1.33*   Anemia Panel: No results for input(s): VITAMINB12, FOLATE, FERRITIN, TIBC, IRON, RETICCTPCT in the last 72 hours. Urine analysis:  Component Value Date/Time   COLORURINE YELLOW 07/31/2020 1421   APPEARANCEUR CLEAR 07/31/2020 1421   LABSPEC 1.009 07/31/2020 1421   PHURINE 7.0 07/31/2020 1421   GLUCOSEU NEGATIVE 07/31/2020 1421   HGBUR NEGATIVE 07/31/2020 1421   BILIRUBINUR NEGATIVE 07/31/2020 1421   KETONESUR NEGATIVE 07/31/2020 1421   PROTEINUR NEGATIVE 07/31/2020 1421   NITRITE NEGATIVE 07/31/2020 1421   LEUKOCYTESUR NEGATIVE 07/31/2020 1421    Radiological Exams on Admission: CT ABDOMEN PELVIS WO CONTRAST  Result Date: 07/31/2020 CLINICAL DATA:  Abdominal pain.  Recent recovery EXAM: CT ABDOMEN AND PELVIS WITHOUT CONTRAST TECHNIQUE: Multidetector CT imaging of the abdomen and pelvis was performed following the standard protocol without IV contrast. COMPARISON:  CT scan of the chest July 11, 2020. CT scan of the abdomen and pelvis January 21, 2016. FINDINGS: Lower chest: The previously identified right-sided pleural effusion has resolved. The left-sided pleural effusion with associated opacity persists. There is a moderate hiatal hernia. Coronary artery  calcifications are noted. Cardiomegaly identified. No other abnormalities in the lower chest. Hepatobiliary: No focal liver abnormality is seen. No gallstones, gallbladder wall thickening, or biliary dilatation. Pancreas: Unremarkable. No pancreatic ductal dilatation or surrounding inflammatory changes. Spleen: Normal in size without focal abnormality. Adrenals/Urinary Tract: Adrenal glands are unremarkable. Kidneys are normal, without renal calculi, focal lesion, or hydronephrosis. Bladder is unremarkable. Stomach/Bowel: There is a moderate hiatal hernia. The stomach is otherwise unremarkable. The small bowel is nondistended with no evidence of obstruction. Moderate fecal loading throughout much of the colon. Scattered colonic diverticulosis without diverticulitis. The colon and appendix are otherwise normal. Vascular/Lymphatic: Calcified atherosclerosis in the nonaneurysmal abdominal aorta. No adenopathy. Reproductive: Prostate is unremarkable. Other: No free air or free fluid. Musculoskeletal: The patient is status post vertebroplasty at T12 with significant loss of vertebral body height, unchanged since July 11, 2020. No acute bony abnormalities. IMPRESSION: 1. Left effusion with underlying opacity again identified. The right effusion and underlying opacity has resolved. 2. No cause for abdominal pain identified. 3. Coronary artery calcifications. Atherosclerotic change in the abdominal aorta. 4. Moderate hiatal hernia. Electronically Signed   By: Dorise Bullion III M.D   On: 07/31/2020 16:18   CT Head Wo Contrast  Result Date: 07/31/2020 CLINICAL DATA:  Mental status change. EXAM: CT HEAD WITHOUT CONTRAST TECHNIQUE: Contiguous axial images were obtained from the base of the skull through the vertex without intravenous contrast. COMPARISON:  None. FINDINGS: Brain: Ventricles and sulci are mildly prominent compatible with atrophy. Periventricular and subcortical white matter hypodensity compatible with  chronic microvascular ischemic changes. No evidence for acute cortically based infarct, intracranial hemorrhage, mass lesion or mass-effect. Vascular: Unremarkable Skull: Intact. Sinuses/Orbits: Mild mucosal thickening ethmoid air cells. Remainder the paranasal sinuses unremarkable. Mastoid air cells unremarkable. Orbits unremarkable. Other: None. IMPRESSION: No acute intracranial process. Electronically Signed   By: Lovey Newcomer M.D.   On: 07/31/2020 12:31   DG Chest Portable 1 View  Result Date: 07/31/2020 CLINICAL DATA:  Shortness of breath.  Lethargy and weakness. EXAM: PORTABLE CHEST 1 VIEW COMPARISON:  07/11/2020 FINDINGS: Small to moderate-sized left pleural effusion, decreased. Patchy and linear density in the aerated portion of the left mid and lower lung zone. Minimal linear atelectasis or scarring at the right lung base. No visible right pleural fluid. Grossly stable borderline enlarged cardiac silhouette and tortuous calcified thoracic aorta. Thoracic spine degenerative changes. IMPRESSION: Decreased left pleural fluid with persistent atelectasis/pneumonia in the left mid and lower lung zones. Electronically Signed   By: Percell Locus.D.  On: 07/31/2020 13:38     Assessment/Plan  AKI on CKD on stage IIIb Baseline creatinine 1.4-1.5. now creat at 2.5. suspect in the setting of recent increase in his torsemide as well as failure to thrive decreased p.o. intake, COVID-19 infection.  We will hold his torsemide, continue IV fluid hydration and monitor fluid status closely avoid fluid overload  Dehydration/lactic acidosis due to dehydration.  Resolved.  Borderline positive troponin suspect in the setting of CHF no delta delta.  No chest pain.  Generalized weakness/failure to thrive: In the setting of dehydration and AKI recent admission deconditioning.  Would hydrate.  PT OT eval.  ?Stroke with facial droop however nonfocal on exam CT head no acute finding.  MRI brain pending if negative no  further work-up needed  A. fib rate controlled, recently was switched from Coumadin to Eliquis due to right thigh hematoma.  Resume  Eliquis.  There is question of ?  20s heart rate per EMS Will monitor on telemetry and resume metoprolol if no episodes.  Hyperlipidemia: Resume statin  Diabetes mellitus without complication: Add sliding scale insulin.  Recent A1c was normal at 5.6 on 7/10   Essential hypertension: BP stable.hold toprol  COVID-19 virus infection: Patient appears to be having generalized weakness otherwise no respiratory symptoms.  Check CRP D-dimer, recently placed on PaxLOVID BY pcp- pharmacy consulted to continue  Chronic diastolic DK:[id number] admission for CHF exacerbation however he was extensively diuresed and discharged on torsemide.Due to AKI holding torsemide, on gentle IV fluids.  Watch daily weight intake output.  He had significant leg edema and has improved on his last admission.  Chronic constipation:No acute finding on the CT,add MiraLAX daily along with as needed Dulcolax and as needed Fleet enema for severe constipation.  Right upper extremity arm wound wound care consulted  There is no height or weight on file to calculate BMI.   Severity of Illness: Observation status  DVT prophylaxis: SCDs Start: 07/31/20 1722eliquis Code Status:   Code Status: DNR -confirmed DNR  status w/ patient patient's daughter at the bedside Family Communication: Admission, patients condition and plan of care including tests being ordered have been discussed with the patient and daughter who indicate understanding and agree with the plan and Code Status.  Consults called:  none  Antonieta Pert MD [facility]  If 7PM-7AM, please contact night-coverage [email or web address]  07/31/2020, 5:25 PM

## 2020-07-31 NOTE — ED Provider Notes (Signed)
I received the patient in signout from Dr. Billy Fischer.  Briefly the patient is a 86 year old male with progressive weakness since Sunday found to have [place].  Was having some abdominal discomfort.  Plan for CT scan and then admission.   Deno Etienne, DO 07/31/20 1655

## 2020-08-01 DIAGNOSIS — E785 Hyperlipidemia, unspecified: Secondary | ICD-10-CM | POA: Diagnosis present

## 2020-08-01 DIAGNOSIS — I2510 Atherosclerotic heart disease of native coronary artery without angina pectoris: Secondary | ICD-10-CM | POA: Diagnosis present

## 2020-08-01 DIAGNOSIS — M8580 Other specified disorders of bone density and structure, unspecified site: Secondary | ICD-10-CM | POA: Diagnosis present

## 2020-08-01 DIAGNOSIS — W19XXXA Unspecified fall, initial encounter: Secondary | ICD-10-CM | POA: Diagnosis present

## 2020-08-01 DIAGNOSIS — M47814 Spondylosis without myelopathy or radiculopathy, thoracic region: Secondary | ICD-10-CM | POA: Diagnosis present

## 2020-08-01 DIAGNOSIS — K5909 Other constipation: Secondary | ICD-10-CM | POA: Diagnosis present

## 2020-08-01 DIAGNOSIS — E860 Dehydration: Secondary | ICD-10-CM | POA: Diagnosis present

## 2020-08-01 DIAGNOSIS — I5032 Chronic diastolic (congestive) heart failure: Secondary | ICD-10-CM | POA: Diagnosis present

## 2020-08-01 DIAGNOSIS — E1122 Type 2 diabetes mellitus with diabetic chronic kidney disease: Secondary | ICD-10-CM | POA: Diagnosis present

## 2020-08-01 DIAGNOSIS — U071 COVID-19: Secondary | ICD-10-CM | POA: Diagnosis present

## 2020-08-01 DIAGNOSIS — F05 Delirium due to known physiological condition: Secondary | ICD-10-CM | POA: Diagnosis present

## 2020-08-01 DIAGNOSIS — E861 Hypovolemia: Secondary | ICD-10-CM | POA: Diagnosis present

## 2020-08-01 DIAGNOSIS — I130 Hypertensive heart and chronic kidney disease with heart failure and stage 1 through stage 4 chronic kidney disease, or unspecified chronic kidney disease: Secondary | ICD-10-CM | POA: Diagnosis present

## 2020-08-01 DIAGNOSIS — I482 Chronic atrial fibrillation, unspecified: Secondary | ICD-10-CM | POA: Diagnosis present

## 2020-08-01 DIAGNOSIS — H5500 Unspecified nystagmus: Secondary | ICD-10-CM | POA: Diagnosis not present

## 2020-08-01 DIAGNOSIS — R29810 Facial weakness: Secondary | ICD-10-CM | POA: Diagnosis present

## 2020-08-01 DIAGNOSIS — G9341 Metabolic encephalopathy: Secondary | ICD-10-CM | POA: Diagnosis present

## 2020-08-01 DIAGNOSIS — N1832 Chronic kidney disease, stage 3b: Secondary | ICD-10-CM | POA: Diagnosis present

## 2020-08-01 DIAGNOSIS — N179 Acute kidney failure, unspecified: Secondary | ICD-10-CM | POA: Diagnosis present

## 2020-08-01 DIAGNOSIS — J1282 Pneumonia due to coronavirus disease 2019: Secondary | ICD-10-CM | POA: Diagnosis present

## 2020-08-01 DIAGNOSIS — Z66 Do not resuscitate: Secondary | ICD-10-CM | POA: Diagnosis present

## 2020-08-01 DIAGNOSIS — I700 Atherosclerosis of aorta: Secondary | ICD-10-CM | POA: Diagnosis present

## 2020-08-01 DIAGNOSIS — S7011XA Contusion of right thigh, initial encounter: Secondary | ICD-10-CM | POA: Diagnosis present

## 2020-08-01 DIAGNOSIS — E872 Acidosis: Secondary | ICD-10-CM | POA: Diagnosis present

## 2020-08-01 DIAGNOSIS — R296 Repeated falls: Secondary | ICD-10-CM | POA: Diagnosis present

## 2020-08-01 DIAGNOSIS — K219 Gastro-esophageal reflux disease without esophagitis: Secondary | ICD-10-CM | POA: Diagnosis present

## 2020-08-01 LAB — COMPREHENSIVE METABOLIC PANEL
ALT: 10 U/L (ref 0–44)
AST: 28 U/L (ref 15–41)
Albumin: 2.6 g/dL — ABNORMAL LOW (ref 3.5–5.0)
Alkaline Phosphatase: 77 U/L (ref 38–126)
Anion gap: 11 (ref 5–15)
BUN: 44 mg/dL — ABNORMAL HIGH (ref 8–23)
CO2: 32 mmol/L (ref 22–32)
Calcium: 9.7 mg/dL (ref 8.9–10.3)
Chloride: 97 mmol/L — ABNORMAL LOW (ref 98–111)
Creatinine, Ser: 2.29 mg/dL — ABNORMAL HIGH (ref 0.61–1.24)
GFR, Estimated: 27 mL/min — ABNORMAL LOW (ref >60)
Glucose, Bld: 100 mg/dL — ABNORMAL HIGH (ref 70–99)
Potassium: 3.6 mmol/L (ref 3.5–5.1)
Sodium: 140 mmol/L (ref 135–145)
Total Bilirubin: 1.1 mg/dL (ref 0.3–1.2)
Total Protein: 6.2 g/dL — ABNORMAL LOW (ref 6.5–8.1)

## 2020-08-01 LAB — CBC WITH DIFFERENTIAL/PLATELET
Abs Immature Granulocytes: 0.02 10*3/uL (ref 0.00–0.07)
Basophils Absolute: 0 10*3/uL (ref 0.0–0.1)
Basophils Relative: 1 %
Eosinophils Absolute: 0.1 10*3/uL (ref 0.0–0.5)
Eosinophils Relative: 1 %
HCT: 32.1 % — ABNORMAL LOW (ref 39.0–52.0)
Hemoglobin: 10.2 g/dL — ABNORMAL LOW (ref 13.0–17.0)
Immature Granulocytes: 0 %
Lymphocytes Relative: 16 %
Lymphs Abs: 0.9 10*3/uL (ref 0.7–4.0)
MCH: 30.7 pg (ref 26.0–34.0)
MCHC: 31.8 g/dL (ref 30.0–36.0)
MCV: 96.7 fL (ref 80.0–100.0)
Monocytes Absolute: 0.8 10*3/uL (ref 0.1–1.0)
Monocytes Relative: 15 %
Neutro Abs: 3.7 10*3/uL (ref 1.7–7.7)
Neutrophils Relative %: 67 %
Platelets: 209 10*3/uL (ref 150–400)
RBC: 3.32 MIL/uL — ABNORMAL LOW (ref 4.22–5.81)
RDW: 17.5 % — ABNORMAL HIGH (ref 11.5–15.5)
WBC: 5.5 10*3/uL (ref 4.0–10.5)
nRBC: 0 % (ref 0.0–0.2)

## 2020-08-01 LAB — C-REACTIVE PROTEIN: CRP: 0.6 mg/dL (ref <1.0)

## 2020-08-01 LAB — URINE CULTURE: Culture: NO GROWTH

## 2020-08-01 LAB — D-DIMER, QUANTITATIVE: D-Dimer, Quant: 2.94 ug/mL-FEU — ABNORMAL HIGH (ref 0.00–0.50)

## 2020-08-01 MED ORDER — SODIUM CHLORIDE 0.9 % IV BOLUS
500.0000 mL | Freq: Once | INTRAVENOUS | Status: AC
  Administered 2020-08-01: 500 mL via INTRAVENOUS

## 2020-08-01 NOTE — Progress Notes (Signed)
PROGRESS NOTE    [name]  [id number] DOB: August 04, 1931 DOA: 07/31/2020 PCP: Midge Minium, MD   Chief Complaint  Patient presents with   Bradycardia   Brief Narrative: 87 y.o. male with medical history significant for chronic diastolic CHF, CKD stage IIIb baselien creat 1.4-1.5, hypertension, HLD, obesity,type 2 diabetes mellitus, chronic venous insufficiency, A. Fib, recent admission 7/10-/720 for acute on chronic diastolic CHF, right thigh hematoma while on Coumadin> wt on d/c 214 lb, 233 lb on admission, syndrome on Eliquis, torsemide brought to the hospital due to progressive generalized weakness lethargy. Upon his discharge patient started to have weakness, difficulty getting up, sometimes confused, constipation He tested positive for COVID on 07/25/20.  No report of shortness of breath hypoxia fever focal weakness.  Apparently  EMS reported bradycardic to the 20s on the cardiac monitor but unable to capture/no records available.   ED Course: Hemodynamically stable, saturating well on room air, tested positive for COVID-19 negative, BNP 521 troponin 23 mild lactic acidosis that resolved to 1.2, UA fairly normal, TSH 1.4.  Chest x-ray showed decreased left pleural fluid with persistent atelectasis/pneumonia in the left mid and lower lung zones.  Patient was complaining of chronic constipation and CT abdomen pelvis was done that showed-renal acute finding, left effusion with underlying opacity identified right effusion resolved.  There is question of facial droop CT head was unremarkable MRI head pending.  Labs showed AKI worse from baseline admission has been requested for further management. Subjective: Seen this morning.  He feels well much improved weakness is improving.  Tolerating IV fluids.  Eating meal.  No shortness of breath no respiratory symptoms.  Assessment & Plan:  AKI on CKD on stage IIIb Baseline creatinine 1.4-1.5. now creat at 2.5. suspect in the setting of  recent increase in his torsemide as well as failure to thrive decreased p.o. intake, COVID-19 infection.  Continue to hold torsemide .  Orthostatic vitals positive with blood pressure 79/46 standing and 112/44 lying,. Increase IV fluid hydration after 500 ml bolus nss.  Continue to hold torsemide  Orthostatic hypotension: Due to dehydration diuretics see above.  Increase IV fluids   Dehydration/lactic acidosis due to dehydration.  Resolved.   Borderline positive troponin suspect in the setting of CHF no delta delta.  No chest pain.   Generalized weakness/failure to thrive: Due to dehydration and AKI.  PT OT eval  Stroke was ruled out by MRI.   A. fib rate controlled, recently was switched from Coumadin to Eliquis due to right thigh hematoma.  Continue Eliquis ? hr 20s heart rate per EMS -overnight heart rate has been relatively stable we will continue holding metoprolol-consider resuming low-dose once euvolemic continue   Hyperlipidemia: Statin   Diabetes mellitus without complication: Blood sugar stable on sliding scale.Recent A1c was normal at 5.6 on 7/10   Essential hypertension: Orthostatic hypotension present.  Holding metoprolol  COVID-19 virus infection: Largely asymptomatic no respiratory symptoms mostly with generalized weakness.  CRP normal D-dimer somewhat positive but on Eliquis already.  Continue remdesivir x3 days  Chronic diastolic CHF: Currently hypovolemic holding torsemide monitor intake output    Chronic constipation:No acute finding on the CT,add MiraLAX daily along with as needed Dulcolax and as needed Fleet enema for severe constipation.  BM x1 charted.   Right upper extremity arm wound wound care consulted, continue wound dressing.  Diet Order             Diet 2 gram sodium Room service appropriate? Yes;  Fluid consistency: Thin  Diet effective now                  Patient's Body mass index is 27.68 kg/m. DVT prophylaxis: apixaban (ELIQUIS) tablet 2.5 mg  Start: 07/31/20 2200 SCDs Start: 07/31/20 1722 Code Status:   Code Status: DNR  Family Communication: plan of care discussed with patient at bedside. Status is: ADMITTED AS Observation Patient remains hospitalized will need at least 2 midnight stay due to management of AKI and CKD, orthostatic hypotension, severe dehydration  Dispo: The patient is from: Home              Anticipated d/c is to:  TBD              Patient currently is not medically stable to d/c.   Difficult to place patient No       Unresulted Labs (From admission, onward)     Start     Ordered   08/01/20 0500  C-reactive protein  Daily,   R      07/31/20 1725   08/01/20 0500  D-dimer, quantitative  Daily,   R      07/31/20 1725   08/01/20 0500  Comprehensive metabolic panel  Daily,   R      07/31/20 1725   08/01/20 0000  CBC with Differential/Platelet  Every 48 hours,   R      07/31/20 1725   07/31/20 1232  Urine Culture  Once,   STAT       Question:  Indication  Answer:  Altered mental status (if no other cause identified)   07/31/20 1236   07/31/20 1230  Differential  Once,   STAT        07/31/20 1236           Medications reviewed:  Scheduled Meds:  albuterol  2 puff Inhalation Q6H   allopurinol  300 mg Oral Daily   apixaban  2.5 mg Oral BID   polyethylene glycol  17 g Oral Daily   simvastatin  20 mg Oral q1800   vitamin B-12  500 mcg Oral Daily   Continuous Infusions:  sodium chloride 50 mL/hr at 08/01/20 0924   sodium chloride Stopped (07/31/20 2338)   remdesivir 100 mg in NS 100 mL     Consultants:see note  Procedures:see note Antimicrobials: Anti-infectives (From admission, onward)    Start     Dose/Rate Route Frequency Ordered Stop   08/01/20 1000  remdesivir 100 mg in sodium chloride 0.9 % 100 mL IVPB       See Hyperspace for full Linked Orders Report.   100 mg 200 mL/hr over 30 Minutes Intravenous Daily 07/31/20 1812 08/03/20 0959   07/31/20 1912  remdesivir 200 mg in sodium  chloride 0.9% 250 mL IVPB       See Hyperspace for full Linked Orders Report.   200 mg 580 mL/hr over 30 Minutes Intravenous Once 07/31/20 1812 07/31/20 2211      Culture/Microbiology No results found for: SDES, SPECREQUEST, CULT, REPTSTATUS  Other culture-see note  Objective: Vitals: Today's Vitals   08/01/20 0510 08/01/20 0725 08/01/20 0921 08/01/20 0935  BP: (!) 118/51 (!) 116/53  121/60  Pulse: 62 63    Resp: 18 18    Temp: (!) 97.3 F (36.3 C) 97.8 F (36.6 C)    TempSrc: Oral Axillary    SpO2: 99% 99%    Weight:      Height:      PainSc:  0-No pain     Intake/Output Summary (Last 24 hours) at 08/01/2020 1124 Last data filed at 08/01/2020 0400 Gross per 24 hour  Intake 502.75 ml  Output --  Net 502.75 ml   Filed Weights   07/31/20 1800  Weight: 87.5 kg   Weight change:   Intake/Output from previous day: 07/30 0701 - 07/31 0700 In: 502.8 [P.O.:120; I.V.:382.8] Out: -  Intake/Output this shift: No intake/output data recorded. Filed Weights   07/31/20 1800  Weight: 87.5 kg   Examination: General exam: AAOX3, elderly not in distress. HEENT:Oral mucosa moist, Ear/Nose WNL grossly,dentition normal. Respiratory system: bilaterally diminished,no use of accessory muscle, non tender. Cardiovascular system: S1 & S2 +,No JVD. Gastrointestinal system: Abdomen soft, NT,ND, BS+. Nervous System:Alert, awake, moving extremities Extremities: Lymphedema/chronic appearing lower leg edema this is much improved from his previous edema 2 weeks ago Skin: No rashes,no icterus. MSK: Normal muscle bulk,tone, power Data Reviewed: I have personally reviewed following labs and imaging studies CBC: Recent Labs  Lab 07/31/20 1045 07/31/20 1251 08/01/20 0029  WBC 5.4 5.2 5.5  NEUTROABS  --  2.9 3.7  HGB 10.5* 10.8* 10.2*  HCT 33.0* 34.0* 32.1*  MCV 98.2 97.1 96.7  PLT 235 227 [id number]   Basic Metabolic Panel: Recent Labs  Lab 07/31/20 1045 08/01/20 0438  NA 138 140  K  3.5 3.6  CL 97* 97*  CO2 32 32  GLUCOSE 117* 100*  BUN 46* 44*  CREATININE 2.30* 2.29*  CALCIUM 9.8 9.7   GFR: Estimated Creatinine Clearance: 23.5 mL/min (A) (by C-G formula based on SCr of 2.29 mg/dL (H)). Liver Function Tests: Recent Labs  Lab 07/31/20 1251 08/01/20 0438  AST 22 28  ALT 11 10  ALKPHOS 79 77  BILITOT 1.7* 1.1  PROT 6.4* 6.2*  ALBUMIN 2.7* 2.6*   No results for input(s): LIPASE, AMYLASE in the last 168 hours. Recent Labs  Lab 07/31/20 1251  AMMONIA 33   Coagulation Profile: No results for input(s): INR, PROTIME in the last 168 hours. Cardiac Enzymes: No results for input(s): CKTOTAL, CKMB, CKMBINDEX, TROPONINI in the last 168 hours. BNP (last 3 results) No results for input(s): PROBNP in the last 8760 hours. HbA1C: No results for input(s): HGBA1C in the last 72 hours. CBG: No results for input(s): GLUCAP in the last 168 hours. Lipid Profile: No results for input(s): CHOL, HDL, LDLCALC, TRIG, CHOLHDL, LDLDIRECT in the last 72 hours. Thyroid Function Tests: Recent Labs    07/31/20 1251  TSH 1.434  FREET4 1.33*   Anemia Panel: No results for input(s): VITAMINB12, FOLATE, FERRITIN, TIBC, IRON, RETICCTPCT in the last 72 hours. Sepsis Labs: Recent Labs  Lab 07/31/20 1251 07/31/20 1512  LATICACIDVEN 2.1* 1.8    Recent Results (from the past 240 hour(s))  Resp Panel by RT-PCR (Flu A&B, Covid) Nasopharyngeal Swab     Status: Abnormal   Collection Time: 07/31/20 11:08 AM   Specimen: Nasopharyngeal Swab; Nasopharyngeal(NP) swabs in vial transport medium  Result Value Ref Range Status   SARS Coronavirus 2 by RT PCR POSITIVE (A) NEGATIVE Final    Comment: RESULT CALLED TO, READ BACK BY AND VERIFIED WITH: RN S.WOODWORTH ON [id number] AT 1240 BY E.PARRISH (NOTE) SARS-CoV-2 target nucleic acids are DETECTED.  The SARS-CoV-2 RNA is generally detectable in upper respiratory specimens during the acute phase of infection. Positive results  are indicative of the presence of the identified virus, but do not rule out bacterial infection or co-infection with other pathogens not detected by the  test. Clinical correlation with patient history and other diagnostic information is necessary to determine patient infection status. The expected result is Negative.  Fact Sheet for Patients: EntrepreneurPulse.com.au  Fact Sheet for Healthcare Providers: IncredibleEmployment.be  This test is not yet approved or cleared by the [place] FDA and  has been authorized for detection and/or diagnosis of SARS-CoV-2 by FDA under an Emergency Use Authorization (EUA).  This EUA will remain in effect (meaning th is test can be used) for the duration of  the COVID-19 declaration under Section 564(b)(1) of the Act, 21 U.S.C. section 360bbb-3(b)(1), unless the authorization is terminated or revoked sooner.     Influenza A by PCR NEGATIVE NEGATIVE Final   Influenza B by PCR NEGATIVE NEGATIVE Final    Comment: (NOTE) The Xpert Xpress SARS-CoV-2/FLU/RSV plus assay is intended as an aid in the diagnosis of influenza from Nasopharyngeal swab specimens and should not be used as a sole basis for treatment. Nasal washings and aspirates are unacceptable for Xpert Xpress SARS-CoV-2/FLU/RSV testing.  Fact Sheet for Patients: EntrepreneurPulse.com.au  Fact Sheet for Healthcare Providers: IncredibleEmployment.be  This test is not yet approved or cleared by the [place] FDA and has been authorized for detection and/or diagnosis of SARS-CoV-2 by FDA under an Emergency Use Authorization (EUA). This EUA will remain in effect (meaning this test can be used) for the duration of the COVID-19 declaration under Section 564(b)(1) of the Act, 21 U.S.C. section 360bbb-3(b)(1), unless the authorization is terminated or revoked.  Performed at [facility], [place] [address]  [place], [place] [address]      Radiology Studies: CT ABDOMEN PELVIS WO CONTRAST  Result Date: 07/31/2020 CLINICAL DATA:  Abdominal pain.  Recent recovery EXAM: CT ABDOMEN AND PELVIS WITHOUT CONTRAST TECHNIQUE: Multidetector CT imaging of the abdomen and pelvis was performed following the standard protocol without IV contrast. COMPARISON:  CT scan of the chest July 11, 2020. CT scan of the abdomen and pelvis January 21, 2016. FINDINGS: Lower chest: The previously identified right-sided pleural effusion has resolved. The left-sided pleural effusion with associated opacity persists. There is a moderate hiatal hernia. Coronary artery calcifications are noted. Cardiomegaly identified. No other abnormalities in the lower chest. Hepatobiliary: No focal liver abnormality is seen. No gallstones, gallbladder wall thickening, or biliary dilatation. Pancreas: Unremarkable. No pancreatic ductal dilatation or surrounding inflammatory changes. Spleen: Normal in size without focal abnormality. Adrenals/Urinary Tract: Adrenal glands are unremarkable. Kidneys are normal, without renal calculi, focal lesion, or hydronephrosis. Bladder is unremarkable. Stomach/Bowel: There is a moderate hiatal hernia. The stomach is otherwise unremarkable. The small bowel is nondistended with no evidence of obstruction. Moderate fecal loading throughout much of the colon. Scattered colonic diverticulosis without diverticulitis. The colon and appendix are otherwise normal. Vascular/Lymphatic: Calcified atherosclerosis in the nonaneurysmal abdominal aorta. No adenopathy. Reproductive: Prostate is unremarkable. Other: No free air or free fluid. Musculoskeletal: The patient is status post vertebroplasty at T12 with significant loss of vertebral body height, unchanged since July 11, 2020. No acute bony abnormalities. IMPRESSION: 1. Left effusion with underlying opacity again identified. The right effusion and underlying opacity has resolved. 2. No cause  for abdominal pain identified. 3. Coronary artery calcifications. Atherosclerotic change in the abdominal aorta. 4. Moderate hiatal hernia. Electronically Signed   By: Dorise Bullion III M.D   On: 07/31/2020 16:18   CT Head Wo Contrast  Result Date: 07/31/2020 CLINICAL DATA:  Mental status change. EXAM: CT HEAD WITHOUT CONTRAST TECHNIQUE: Contiguous axial images were obtained from  the base of the skull through the vertex without intravenous contrast. COMPARISON:  None. FINDINGS: Brain: Ventricles and sulci are mildly prominent compatible with atrophy. Periventricular and subcortical white matter hypodensity compatible with chronic microvascular ischemic changes. No evidence for acute cortically based infarct, intracranial hemorrhage, mass lesion or mass-effect. Vascular: Unremarkable Skull: Intact. Sinuses/Orbits: Mild mucosal thickening ethmoid air cells. Remainder the paranasal sinuses unremarkable. Mastoid air cells unremarkable. Orbits unremarkable. Other: None. IMPRESSION: No acute intracranial process. Electronically Signed   By: Lovey Newcomer M.D.   On: 07/31/2020 12:31   MR BRAIN WO CONTRAST  Result Date: 07/31/2020 CLINICAL DATA:  Stroke suspected. EXAM: MRI HEAD WITHOUT CONTRAST TECHNIQUE: Multiplanar, multiecho pulse sequences of the brain and surrounding structures were obtained without intravenous contrast. COMPARISON:  Head CT 07/31/2020. FINDINGS: Brain: Mild-to-moderate generalized cerebral atrophy. Commensurate prominence of the ventricles and sulci. Comparatively mild cerebellar atrophy. There are a few small scattered foci of T2/FLAIR hyperintensity within the cerebral white matter, nonspecific but compatible with minimal changes of chronic small vessel ischemia. There is no acute infarct. No evidence of an intracranial mass. No chronic intracranial blood products. No extra-axial fluid collection. No midline shift. Vascular: Expected proximal arterial flow voids. Skull and upper cervical  spine: No focal marrow lesion Sinuses/Orbits: Visualized orbits show no acute finding. Left lens replacement. IMPRESSION: No evidence of acute intracranial abnormality. Minimal chronic small-vessel ischemic changes within the cerebral white matter. Mild-to-moderate generalized cerebral atrophy. Comparatively mild cerebellar atrophy. Electronically Signed   By: Kellie Simmering DO   On: 07/31/2020 19:28   DG Chest Portable 1 View  Result Date: 07/31/2020 CLINICAL DATA:  Shortness of breath.  Lethargy and weakness. EXAM: PORTABLE CHEST 1 VIEW COMPARISON:  07/11/2020 FINDINGS: Small to moderate-sized left pleural effusion, decreased. Patchy and linear density in the aerated portion of the left mid and lower lung zone. Minimal linear atelectasis or scarring at the right lung base. No visible right pleural fluid. Grossly stable borderline enlarged cardiac silhouette and tortuous calcified thoracic aorta. Thoracic spine degenerative changes. IMPRESSION: Decreased left pleural fluid with persistent atelectasis/pneumonia in the left mid and lower lung zones. Electronically Signed   By: Claudie Revering M.D.   On: 07/31/2020 13:38     LOS: 0 days   Antonieta Pert, MD [facility]  08/01/2020, 11:24 AM

## 2020-08-02 ENCOUNTER — Telehealth: Payer: Self-pay | Admitting: Interventional Cardiology

## 2020-08-02 DIAGNOSIS — N179 Acute kidney failure, unspecified: Secondary | ICD-10-CM | POA: Diagnosis not present

## 2020-08-02 LAB — BASIC METABOLIC PANEL
Anion gap: 9 (ref 5–15)
BUN: 39 mg/dL — ABNORMAL HIGH (ref 8–23)
CO2: 30 mmol/L (ref 22–32)
Calcium: 9 mg/dL (ref 8.9–10.3)
Chloride: 102 mmol/L (ref 98–111)
Creatinine, Ser: 1.94 mg/dL — ABNORMAL HIGH (ref 0.61–1.24)
GFR, Estimated: 33 mL/min — ABNORMAL LOW (ref >60)
Glucose, Bld: 91 mg/dL (ref 70–99)
Potassium: 3.6 mmol/L (ref 3.5–5.1)
Sodium: 141 mmol/L (ref 135–145)

## 2020-08-02 MED ORDER — DOCUSATE SODIUM 100 MG PO CAPS
100.0000 mg | ORAL_CAPSULE | Freq: Two times a day (BID) | ORAL | Status: DC
  Administered 2020-08-03 – 2020-08-10 (×15): 100 mg via ORAL
  Filled 2020-08-02 (×17): qty 1

## 2020-08-02 NOTE — Telephone Encounter (Signed)
Daughter of patient wanted to know if the patient had a BMET drawn during his hospital stay. He was scheduled to have labs done 08/03/20 but is currently admitted to [facility] with [facility].   The daughter wanted to know if she needs to schedule lab work after he gets discharged.  Please advise

## 2020-08-02 NOTE — Progress Notes (Signed)
PROGRESS NOTE    [name]  [id number] DOB: 1931/11/08 DOA: 07/31/2020 PCP: Midge Minium, MD   Chief Complaint  Patient presents with   Bradycardia   Brief Narrative: 87 y.o. male with medical history significant for chronic diastolic CHF, CKD stage IIIb baselien creat 1.4-1.5, hypertension, HLD, obesity,type 2 diabetes mellitus, chronic venous insufficiency, A. Fib, recent admission 7/10-/720 for acute on chronic diastolic CHF, right thigh hematoma while on Coumadin> wt on d/c 214 lb, 233 lb on admission, syndrome on Eliquis, torsemide brought to the hospital due to progressive generalized weakness lethargy. Upon his discharge patient started to have weakness, difficulty getting up, sometimes confused, constipation He tested positive for COVID on 07/25/20.  No report of shortness of breath hypoxia fever focal weakness.  Apparently  EMS reported bradycardic to the 20s on the cardiac monitor but unable to capture/no records available.   ED Course: Hemodynamically stable, saturating well on room air, tested positive for COVID-19 negative, BNP 521 troponin 23 mild lactic acidosis that resolved to 1.2, UA fairly normal, TSH 1.4.  Chest x-ray showed decreased left pleural fluid with persistent atelectasis/pneumonia in the left mid and lower lung zones.  Patient was complaining of chronic constipation and CT abdomen pelvis was done that showed-renal acute finding, left effusion with underlying opacity identified right effusion resolved.  There is question of facial droop CT head was unremarkable MRI head pending.  Labs showed AKI worse from baseline admission has been requested for further management.  Subjective: Seen this morning.  He is alert awake pleasant.  Has no new complaints.  Nursing reports some intermittent confusion.   Complains of constipation.  Assessment & Plan:  AKI on CKD on stage IIIb Baseline creatinine 1.4-1.5.  Admission creatinine 2.5. suspect in the setting of  recent increase in his torsemide as well as failure to thrive decreased p.o. intake, COVID-19 infection.  Patient volume depleted, now improving with IV fluid hydration continue gentle fluids watch for fluid overload. Continue to hold torsemide Recent Labs  Lab 07/31/20 1045 08/01/20 0438 08/02/20 0113  BUN 46* 44* 39*  CREATININE 2.30* 2.29* 1.94*    Orthostatic hypotension 7/31blood pressure 79/46 standing and 112/44 lying: Given 500 ml NS bolus and continuous IV fluids.  Continue IV fluids monitor orthostatic vitals.    Dehydration/lactic acidosis due to dehydration.  Resolved.   Borderline positive troponin suspect in the setting of CHF no delta delta.  No chest pain.   Generalized weakness/failure to thrive: Due to dehydration and AKI.  Continue PT OT, MRI negative for stroke.     A. fib rate controlled, recently was switched from Coumadin to Eliquis due to right thigh hematoma.  Continue Eliquis renal dose.  Holding metoprolol for now . ? hr 20s heart rate per EMS here on telemetry overall stable no episode of bradycardia of such degree   Hyperlipidemia:  cont statin   Diabetes mellitus without complication: Blood sugar stable.Recent A1c was normal at 5.6 on 7/10   Essential hypertension: Orthostatic hypotension present.  Holding metoprolol.  Continue IV fluid hydration  COVID-19 virus infection: Largely asymptomatic no respiratory symptoms mostly with generalized weakness.  CRP normal D-dimer somewhat positive but on Eliquis already.  We will continue remdesivir x3 days.    Chronic diastolic CHF: Currently hypovolemic needing IV fluid hydration.  Has chronic appearing leg edema not worsening.    Chronic constipation:No acute finding on the CT, continue MiraLAX daily along with as needed Dulcolax and as needed Fleet enema for  severe constipation.  BM x1 charted 7/30-31.   Right upper extremity arm wound wound care has been consulted continue local wound dressing.    Diet Order              Diet 2 gram sodium Room service appropriate? Yes; Fluid consistency: Thin  Diet effective now                  Patient's Body mass index is 27.61 kg/m. DVT prophylaxis: apixaban (ELIQUIS) tablet 2.5 mg Start: 07/31/20 2200 SCDs Start: 07/31/20 1722 Code Status:   Code Status: DNR  Family Communication: plan of care discussed with patient at bedside.  Updated his daughter Jackelyn Poling over the phone-She feels patient will need to go to rehab as he lives alone.  Status is: Inpatient  Patient remains hospitalized will need at least 2 midnight stay due to management of AKI and CKD, orthostatic hypotension, severe dehydration  Dispo: The patient is from: Home              Anticipated d/c is to:  TBD obtain PT OT evaluation requested.  May need a skilled nursing facility as he lives alone              Patient currently is not medically stable to d/c.   Difficult to place patient No  Unresulted Labs (From admission, onward)     Start     Ordered   08/02/20 [id number]  Basic metabolic panel  Daily,   R     Question:  Specimen collection method  Answer:  Lab=Lab collect   08/01/20 1131   08/01/20 0000  CBC with Differential/Platelet  Every 48 hours,   R      07/31/20 1725   07/31/20 1230  Differential  Once,   STAT        07/31/20 1236           Medications reviewed:  Scheduled Meds:  albuterol  2 puff Inhalation Q6H   allopurinol  300 mg Oral Daily   apixaban  2.5 mg Oral BID   polyethylene glycol  17 g Oral Daily   simvastatin  20 mg Oral q1800   vitamin B-12  500 mcg Oral Daily   Continuous Infusions:  sodium chloride 100 mL/hr at 08/02/20 1026   sodium chloride Stopped (08/01/20 2343)   Consultants:see note  Procedures:see note Antimicrobials: Anti-infectives (From admission, onward)    Start     Dose/Rate Route Frequency Ordered Stop   08/01/20 1000  remdesivir 100 mg in sodium chloride 0.9 % 100 mL IVPB       See Hyperspace for full Linked Orders Report.    100 mg 200 mL/hr over 30 Minutes Intravenous Daily 07/31/20 1812 08/02/20 1052   07/31/20 1912  remdesivir 200 mg in sodium chloride 0.9% 250 mL IVPB       See Hyperspace for full Linked Orders Report.   200 mg 580 mL/hr over 30 Minutes Intravenous Once 07/31/20 1812 07/31/20 2211      Culture/Microbiology    Component Value Date/Time   SDES URINE, CLEAN CATCH 07/31/2020 1232   SPECREQUEST NONE 07/31/2020 1232   CULT  07/31/2020 1232    NO GROWTH Performed at [facility], [place] [address], [place], [place] [address]    REPTSTATUS 08/01/2020 FINAL 07/31/2020 1232    Other culture-see note  Objective: Vitals: Today's Vitals   08/01/20 1953 08/02/20 0400 08/02/20 0415 08/02/20 0800  BP: (!) 124/44 118/61  Pulse: 80 79    Resp: 17 18    Temp: (!) 97.5 F (36.4 C) (!) 97.4 F (36.3 C)    TempSrc: Oral Oral    SpO2:  97%    Weight:   87.3 kg   Height:      PainSc: 0-No pain   0-No pain    Intake/Output Summary (Last 24 hours) at 08/02/2020 1325 Last data filed at 08/02/2020 0700 Gross per 24 hour  Intake 2443.4 ml  Output 675 ml  Net 1768.4 ml   Filed Weights   07/31/20 1800 08/02/20 0415  Weight: 87.5 kg 87.3 kg   Weight change: -0.228 kg  Intake/Output from previous day: 07/31 0701 - 08/01 0700 In: 2680.4 [P.O.:237; I.V.:2265.7; IV Piggyback:177.7] Out: 675 [Urine:675] Intake/Output this shift: No intake/output data recorded. Filed Weights   07/31/20 1800 08/02/20 0415  Weight: 87.5 kg 87.3 kg   Examination: General exam: AAOx baseline pleasant, communicative. HEENT:Oral mucosa moist, Ear/Nose WNL grossly, dentition normal. Respiratory system: bilaterally diminished, no added sounds, no use of accessory muscle Cardiovascular system: S1 & S2 +, No JVD,. Gastrointestinal system: Abdomen soft,NT,ND, BS+ Nervous System:Alert, awake, moving extremities and grossly nonfocal Extremities: Chronic appearing lower leg edema-not worse, distal peripheral pulses  palpable.  Skin: No rashes,no icterus. MSK: Normal muscle bulk,tone, power  Data Reviewed: I have personally reviewed following labs and imaging studies CBC: Recent Labs  Lab 07/31/20 1045 07/31/20 1251 08/01/20 0029  WBC 5.4 5.2 5.5  NEUTROABS  --  2.9 3.7  HGB 10.5* 10.8* 10.2*  HCT 33.0* 34.0* 32.1*  MCV 98.2 97.1 96.7  PLT 235 227 [id number]   Basic Metabolic Panel: Recent Labs  Lab 07/31/20 1045 08/01/20 0438 08/02/20 0113  NA 138 140 141  K 3.5 3.6 3.6  CL 97* 97* 102  CO2 32 32 30  GLUCOSE 117* 100* 91  BUN 46* 44* 39*  CREATININE 2.30* 2.29* 1.94*  CALCIUM 9.8 9.7 9.0   GFR: Estimated Creatinine Clearance: 27.7 mL/min (A) (by C-G formula based on SCr of 1.94 mg/dL (H)). Liver Function Tests: Recent Labs  Lab 07/31/20 1251 08/01/20 0438  AST 22 28  ALT 11 10  ALKPHOS 79 77  BILITOT 1.7* 1.1  PROT 6.4* 6.2*  ALBUMIN 2.7* 2.6*   No results for input(s): LIPASE, AMYLASE in the last 168 hours. Recent Labs  Lab 07/31/20 1251  AMMONIA 33   Coagulation Profile: No results for input(s): INR, PROTIME in the last 168 hours. Cardiac Enzymes: No results for input(s): CKTOTAL, CKMB, CKMBINDEX, TROPONINI in the last 168 hours. BNP (last 3 results) No results for input(s): PROBNP in the last 8760 hours. HbA1C: No results for input(s): HGBA1C in the last 72 hours. CBG: No results for input(s): GLUCAP in the last 168 hours. Lipid Profile: No results for input(s): CHOL, HDL, LDLCALC, TRIG, CHOLHDL, LDLDIRECT in the last 72 hours. Thyroid Function Tests: Recent Labs    07/31/20 1251  TSH 1.434  FREET4 1.33*   Anemia Panel: No results for input(s): VITAMINB12, FOLATE, FERRITIN, TIBC, IRON, RETICCTPCT in the last 72 hours. Sepsis Labs: Recent Labs  Lab 07/31/20 1251 07/31/20 1512  LATICACIDVEN 2.1* 1.8    Recent Results (from the past 240 hour(s))  Resp Panel by RT-PCR (Flu A&B, Covid) Nasopharyngeal Swab     Status: Abnormal   Collection Time: 07/31/20  11:08 AM   Specimen: Nasopharyngeal Swab; Nasopharyngeal(NP) swabs in vial transport medium  Result Value Ref Range Status   SARS Coronavirus 2 by  RT PCR POSITIVE (A) NEGATIVE Final    Comment: RESULT CALLED TO, READ BACK BY AND VERIFIED WITH: RN S.WOODWORTH ON [id number] AT 1240 BY E.PARRISH (NOTE) SARS-CoV-2 target nucleic acids are DETECTED.  The SARS-CoV-2 RNA is generally detectable in upper respiratory specimens during the acute phase of infection. Positive results are indicative of the presence of the identified virus, but do not rule out bacterial infection or co-infection with other pathogens not detected by the test. Clinical correlation with patient history and other diagnostic information is necessary to determine patient infection status. The expected result is Negative.  Fact Sheet for Patients: EntrepreneurPulse.com.au  Fact Sheet for Healthcare Providers: IncredibleEmployment.be  This test is not yet approved or cleared by the [place] FDA and  has been authorized for detection and/or diagnosis of SARS-CoV-2 by FDA under an Emergency Use Authorization (EUA).  This EUA will remain in effect (meaning th is test can be used) for the duration of  the COVID-19 declaration under Section 564(b)(1) of the Act, 21 U.S.C. section 360bbb-3(b)(1), unless the authorization is terminated or revoked sooner.     Influenza A by PCR NEGATIVE NEGATIVE Final   Influenza B by PCR NEGATIVE NEGATIVE Final    Comment: (NOTE) The Xpert Xpress SARS-CoV-2/FLU/RSV plus assay is intended as an aid in the diagnosis of influenza from Nasopharyngeal swab specimens and should not be used as a sole basis for treatment. Nasal washings and aspirates are unacceptable for Xpert Xpress SARS-CoV-2/FLU/RSV testing.  Fact Sheet for Patients: EntrepreneurPulse.com.au  Fact Sheet for Healthcare  Providers: IncredibleEmployment.be  This test is not yet approved or cleared by the [place] FDA and has been authorized for detection and/or diagnosis of SARS-CoV-2 by FDA under an Emergency Use Authorization (EUA). This EUA will remain in effect (meaning this test can be used) for the duration of the COVID-19 declaration under Section 564(b)(1) of the Act, 21 U.S.C. section 360bbb-3(b)(1), unless the authorization is terminated or revoked.  Performed at [facility], [place] [address] [place], [place] [address]   Urine Culture     Status: None   Collection Time: 07/31/20 12:32 PM   Specimen: Urine, Clean Catch  Result Value Ref Range Status   Specimen Description URINE, CLEAN CATCH  Final   Special Requests NONE  Final   Culture   Final    NO GROWTH Performed at [facility], [place] [address], [place], [place] [address]    Report Status 08/01/2020 FINAL  Final     Radiology Studies: CT ABDOMEN PELVIS WO CONTRAST  Result Date: 07/31/2020 CLINICAL DATA:  Abdominal pain.  Recent recovery EXAM: CT ABDOMEN AND PELVIS WITHOUT CONTRAST TECHNIQUE: Multidetector CT imaging of the abdomen and pelvis was performed following the standard protocol without IV contrast. COMPARISON:  CT scan of the chest July 11, 2020. CT scan of the abdomen and pelvis January 21, 2016. FINDINGS: Lower chest: The previously identified right-sided pleural effusion has resolved. The left-sided pleural effusion with associated opacity persists. There is a moderate hiatal hernia. Coronary artery calcifications are noted. Cardiomegaly identified. No other abnormalities in the lower chest. Hepatobiliary: No focal liver abnormality is seen. No gallstones, gallbladder wall thickening, or biliary dilatation. Pancreas: Unremarkable. No pancreatic ductal dilatation or surrounding inflammatory changes. Spleen: Normal in size without focal abnormality. Adrenals/Urinary Tract: Adrenal glands are  unremarkable. Kidneys are normal, without renal calculi, focal lesion, or hydronephrosis. Bladder is unremarkable. Stomach/Bowel: There is a moderate hiatal hernia. The stomach is otherwise unremarkable. The small  bowel is nondistended with no evidence of obstruction. Moderate fecal loading throughout much of the colon. Scattered colonic diverticulosis without diverticulitis. The colon and appendix are otherwise normal. Vascular/Lymphatic: Calcified atherosclerosis in the nonaneurysmal abdominal aorta. No adenopathy. Reproductive: Prostate is unremarkable. Other: No free air or free fluid. Musculoskeletal: The patient is status post vertebroplasty at T12 with significant loss of vertebral body height, unchanged since July 11, 2020. No acute bony abnormalities. IMPRESSION: 1. Left effusion with underlying opacity again identified. The right effusion and underlying opacity has resolved. 2. No cause for abdominal pain identified. 3. Coronary artery calcifications. Atherosclerotic change in the abdominal aorta. 4. Moderate hiatal hernia. Electronically Signed   By: Dorise Bullion III M.D   On: 07/31/2020 16:18   MR BRAIN WO CONTRAST  Result Date: 07/31/2020 CLINICAL DATA:  Stroke suspected. EXAM: MRI HEAD WITHOUT CONTRAST TECHNIQUE: Multiplanar, multiecho pulse sequences of the brain and surrounding structures were obtained without intravenous contrast. COMPARISON:  Head CT 07/31/2020. FINDINGS: Brain: Mild-to-moderate generalized cerebral atrophy. Commensurate prominence of the ventricles and sulci. Comparatively mild cerebellar atrophy. There are a few small scattered foci of T2/FLAIR hyperintensity within the cerebral white matter, nonspecific but compatible with minimal changes of chronic small vessel ischemia. There is no acute infarct. No evidence of an intracranial mass. No chronic intracranial blood products. No extra-axial fluid collection. No midline shift. Vascular: Expected proximal arterial flow voids.  Skull and upper cervical spine: No focal marrow lesion Sinuses/Orbits: Visualized orbits show no acute finding. Left lens replacement. IMPRESSION: No evidence of acute intracranial abnormality. Minimal chronic small-vessel ischemic changes within the cerebral white matter. Mild-to-moderate generalized cerebral atrophy. Comparatively mild cerebellar atrophy. Electronically Signed   By: Kellie Simmering DO   On: 07/31/2020 19:28     LOS: 1 day   Antonieta Pert, MD [facility]  08/02/2020, 1:25 PM

## 2020-08-02 NOTE — Telephone Encounter (Signed)
Daughter aware 08/03/20 lab has been cancelled and a BMET was drawn in hospital .Adonis Housekeeper

## 2020-08-03 ENCOUNTER — Other Ambulatory Visit: Payer: Medicare HMO

## 2020-08-03 DIAGNOSIS — N179 Acute kidney failure, unspecified: Secondary | ICD-10-CM | POA: Diagnosis not present

## 2020-08-03 LAB — CBC WITH DIFFERENTIAL/PLATELET
Abs Immature Granulocytes: 0.04 10*3/uL (ref 0.00–0.07)
Basophils Absolute: 0 10*3/uL (ref 0.0–0.1)
Basophils Relative: 1 %
Eosinophils Absolute: 0.1 10*3/uL (ref 0.0–0.5)
Eosinophils Relative: 2 %
HCT: 30.8 % — ABNORMAL LOW (ref 39.0–52.0)
Hemoglobin: 9.8 g/dL — ABNORMAL LOW (ref 13.0–17.0)
Immature Granulocytes: 1 %
Lymphocytes Relative: 18 %
Lymphs Abs: 0.9 10*3/uL (ref 0.7–4.0)
MCH: 30.8 pg (ref 26.0–34.0)
MCHC: 31.8 g/dL (ref 30.0–36.0)
MCV: 96.9 fL (ref 80.0–100.0)
Monocytes Absolute: 0.9 10*3/uL (ref 0.1–1.0)
Monocytes Relative: 17 %
Neutro Abs: 3.1 10*3/uL (ref 1.7–7.7)
Neutrophils Relative %: 61 %
Platelets: 212 10*3/uL (ref 150–400)
RBC: 3.18 MIL/uL — ABNORMAL LOW (ref 4.22–5.81)
RDW: 17.6 % — ABNORMAL HIGH (ref 11.5–15.5)
WBC: 5 10*3/uL (ref 4.0–10.5)
nRBC: 0 % (ref 0.0–0.2)

## 2020-08-03 LAB — BASIC METABOLIC PANEL
Anion gap: 8 (ref 5–15)
BUN: 31 mg/dL — ABNORMAL HIGH (ref 8–23)
CO2: 28 mmol/L (ref 22–32)
Calcium: 9 mg/dL (ref 8.9–10.3)
Chloride: 104 mmol/L (ref 98–111)
Creatinine, Ser: 1.68 mg/dL — ABNORMAL HIGH (ref 0.61–1.24)
GFR, Estimated: 39 mL/min — ABNORMAL LOW (ref >60)
Glucose, Bld: 89 mg/dL (ref 70–99)
Potassium: 3.9 mmol/L (ref 3.5–5.1)
Sodium: 140 mmol/L (ref 135–145)

## 2020-08-03 MED ORDER — QUETIAPINE FUMARATE 25 MG PO TABS
12.5000 mg | ORAL_TABLET | Freq: Every day | ORAL | Status: DC
  Administered 2020-08-03 – 2020-08-10 (×8): 12.5 mg via ORAL
  Filled 2020-08-03 (×8): qty 1

## 2020-08-03 NOTE — NC FL2 (Signed)
[place] MEDICAID FL2 LEVEL OF CARE SCREENING TOOL     IDENTIFICATION  Patient Name: [name] Birthdate: [birth year] Sex: male Admission Date (Current Location): 07/31/2020  [facility] and [place] Number:  Herbalist and Address:  [facility] [place], [place], [place] [address] [place], [place] [address]      Provider Number: [id number]  Attending Physician Name and Address:  Antonieta Pert, MD  Relative Name and Phone Number:  Trudee Kuster [phone]    Current Level of Care: Hospital Recommended Level of Care: [place] Prior Approval Number:    Date Approved/Denied:   PASRR Number: [id number]  Discharge Plan: SNF    Current Diagnoses: Patient Active Problem List   Diagnosis Date Noted   AKI (acute kidney injury) ([place]) 07/31/2020   Chronic diastolic CHF (congestive heart failure) ([place]) 07/31/2020   COVID-19 virus infection 07/26/2020   Anasarca    CAP (community acquired pneumonia) 07/12/2020   Acute on chronic diastolic CHF (congestive heart failure) ([place]) 07/12/2020   Pleural effusion due to CHF (congestive heart failure) ([place]) 07/12/2020   Hepatic congestion 07/12/2020   Acute renal failure superimposed on stage 3b chronic kidney disease ([facility]) 07/12/2020   Thigh hematoma, right, initial encounter 07/12/2020   CHF (congestive heart failure) ([place]) 07/11/2020   Weight loss 06/18/2020   Age-related cataract of right eye [id number]   Diastolic dysfunction [id number]   Obesity (BMI 30-39.9) 02/28/2018   B12 deficiency 02/28/2018   Chronic constipation 04/29/2015   Soft tissue mass 04/29/2015   Chronic venous insufficiency    Encounter for therapeutic drug monitoring 05/28/2014   Essential hypertension 03/07/2014   Preventative health care 03/07/2014   Varicose veins of lower extremities with other complications 0000000   Diabetes mellitus without complication ([place]) [id number]   Normocytic anemia 08/26/2013    Hemorrhoid 02/19/2008   Motion sickness 01/16/2008   Swelling, mass, or lump in head and neck 11/12/2007   Hyperlipidemia 08/13/2006   GOUT 08/13/2006    Orientation RESPIRATION BLADDER Height & Weight     Self, Situation, Place, Time  Normal External catheter, Continent Weight: 203 lb 11.3 oz (92.4 kg) Height:  '5\' 10"'$  (177.8 cm)  BEHAVIORAL SYMPTOMS/MOOD NEUROLOGICAL BOWEL NUTRITION STATUS      Continent Diet (See D/C summary)  AMBULATORY STATUS COMMUNICATION OF NEEDS Skin   Limited Assist Verbally Skin abrasions (wound incision 07/31/20 skin tear arm right upper large)                       Personal Care Assistance Level of Assistance  Bathing, Feeding, Dressing Bathing Assistance: Limited assistance Feeding assistance: Independent Dressing Assistance: Limited assistance     Functional Limitations Info  Sight, Hearing, Speech Sight Info: Impaired Hearing Info: Impaired Speech Info: Adequate    SPECIAL CARE FACTORS FREQUENCY  PT (By licensed PT), OT (By licensed OT)     PT Frequency: 5x/week OT Frequency: 5x/week            Contractures Contractures Info: Not present    Additional Factors Info  Code Status, Allergies Code Status Info: DNR Allergies Info: Influenza Vac Split (Influenza Virus Vaccine)           Current Medications (08/03/2020):  This is the current hospital active medication list Current Facility-Administered Medications  Medication Dose Route Frequency Provider Last Rate Last Admin   0.9 %  sodium chloride infusion   Intravenous Continuous Kc, Ramesh, MD 50 mL/hr at 08/03/20 1442 Rate Change  at 08/03/20 1442   0.9 %  sodium chloride infusion   Intravenous PRN Antonieta Pert, MD   Stopped at 08/02/20 2101   acetaminophen (TYLENOL) tablet 650 mg  650 mg Oral Q6H PRN Kc, Maren [place], MD       albuterol (VENTOLIN HFA) 108 (90 Base) MCG/ACT inhaler 2 puff  2 puff Inhalation Q6H Kc, Ramesh, MD   2 puff at 08/03/20 1443   allopurinol (ZYLOPRIM) tablet  300 mg  300 mg Oral Daily Kc, Ramesh, MD   300 mg at 08/03/20 1101   apixaban (ELIQUIS) tablet 2.5 mg  2.5 mg Oral BID Kc, Maren Beach, MD   2.5 mg at 08/03/20 1101   bisacodyl (DULCOLAX) suppository 10 mg  10 mg Rectal Daily PRN Kc, Maren Beach, MD       chlorpheniramine-HYDROcodone (TUSSIONEX) 10-8 MG/5ML suspension 5 mL  5 mL Oral Q12H PRN Kc, Ramesh, MD       docusate sodium (COLACE) capsule 100 mg  100 mg Oral BID Kc, Ramesh, MD   100 mg at 08/03/20 1101   guaiFENesin-dextromethorphan (ROBITUSSIN DM) 100-10 MG/5ML syrup 10 mL  10 mL Oral Q4H PRN Kc, Ramesh, MD       ondansetron (ZOFRAN) tablet 4 mg  4 mg Oral Q6H PRN Kc, Ramesh, MD       Or   ondansetron (ZOFRAN) injection 4 mg  4 mg Intravenous Q6H PRN Kc, Ramesh, MD       polyethylene glycol (MIRALAX / GLYCOLAX) packet 17 g  17 g Oral Daily Kc, Ramesh, MD   17 g at 08/03/20 1100   QUEtiapine (SEROQUEL) tablet 12.5 mg  12.5 mg Oral QHS Kc, Ramesh, MD       simvastatin (ZOCOR) tablet 20 mg  20 mg Oral q1800 Kc, Ramesh, MD   20 mg at 08/02/20 1651   sodium phosphate (FLEET) 7-19 GM/118ML enema 1 enema  1 enema Rectal Once PRN Kc, Maren Beach, MD       vitamin B-12 (CYANOCOBALAMIN) tablet 500 mcg  500 mcg Oral Daily Kc, Ramesh, MD   500 mcg at 08/03/20 1101     Discharge Medications: Please see discharge summary for a list of discharge medications.  Relevant Imaging Results:  Relevant Lab Results:   Additional Information SSN#: [id number] [place] COVID-19 Vaccine 04/22/2019 , 03/25/2019  Azaria Bartell, LCSWA

## 2020-08-03 NOTE — Evaluation (Signed)
Occupational Therapy Evaluation Patient Details Name: [name] MRN: [id number] DOB: [birth year] Today's Date: 08/03/2020    History of Present Illness Pt adm 7/30 with weakness, lethargy, bradycardia and AKI. Pt tested positive for covid on 07/25/20. Pt with recent adm to [facility] 7/10-7/20 for acute on chronic diastolic hear failure. Also with rt thigh hematoma while there. PMH - chf, afib, ckd, DM, HTN, gout   Clinical Impression   Pt. Was cooperative with evalution. Pt. Lives at home with his wife. She is not able to care for him. Pt. And family are in agreement for snf for rehab. Pt. Has decreased I and safety with mobility and adls. Pt. Is motivated to work with OT to maximize function and adls. Pt. To be followed by acute OT.     Follow Up Recommendations  SNF    Equipment Recommendations  None recommended by OT    Recommendations for Other Services       Precautions / Restrictions Precautions Precautions: Fall Precaution Comments: B LE edema Restrictions Weight Bearing Restrictions: No      Mobility Bed Mobility Overal bed mobility: Needs Assistance Bed Mobility: Supine to Sit     Supine to sit: Supervision;HOB elevated     General bed mobility comments: Incr time and effort    Transfers Overall transfer level: Needs assistance Equipment used: Rolling walker (2 wheeled) Transfers: Stand Pivot Transfers Sit to Stand: Min assist;From elevated surface Stand pivot transfers: Min assist       General transfer comment: cues for proper hand placement    Balance Overall balance assessment: Needs assistance Sitting-balance support: No upper extremity supported;Feet supported Sitting balance-Leahy Scale: Good     Standing balance support: No upper extremity supported Standing balance-Leahy Scale: Fair                             ADL either performed or assessed with clinical judgement   ADL Overall ADL's : Needs  assistance/impaired Eating/Feeding: Modified independent   Grooming: Wash/dry hands;Wash/dry face;Standing;Min guard   Upper Body Bathing: Set up;Sitting   Lower Body Bathing: Min guard;Sit to/from stand   Upper Body Dressing : Set up;Sitting   Lower Body Dressing: Min guard;Sit to/from stand   Toilet Transfer: Comfort height toilet;RW;Ambulation;Minimal assistance   Toileting- Water quality scientist and Hygiene: Min guard;Sit to/from stand       Functional mobility during ADLs: Rolling walker;Minimal assistance General ADL Comments: Pt. using cross leg technique.     Vision Baseline Vision/History: Wears glasses Wears Glasses: At all times Patient Visual Report: No change from baseline Vision Assessment?: No apparent visual deficits     Perception     Praxis      Pertinent Vitals/Pain Pain Assessment: No/denies pain     Hand Dominance Right   Extremity/Trunk Assessment Upper Extremity Assessment Upper Extremity Assessment: Defer to OT evaluation   Lower Extremity Assessment Lower Extremity Assessment: Defer to PT evaluation   Cervical / Trunk Assessment Cervical / Trunk Assessment: Normal   Communication Communication Communication: HOH   Cognition Arousal/Alertness: Awake/alert Behavior During Therapy: WFL for tasks assessed/performed Overall Cognitive Status: Within Functional Limits for tasks assessed                                 General Comments: 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  General Comments  Pt on RA with SpO2 100%    Exercises     Shoulder Instructions      Home Living Family/patient expects to be discharged to:: Private residence Living Arrangements: Spouse/significant other Available Help at Discharge: Family;Available PRN/intermittently Type of Home: House Home Access: Stairs to enter Entrance Stairs-Number of Steps: 2   Home Layout: Able to live on main level with  bedroom/bathroom     Bathroom Shower/Tub: Tub/shower unit;Walk-in shower   Bathroom Toilet: Standard Bathroom Accessibility: Yes   Home Equipment: Cane - single point;Tub bench;Walker - 2 wheels          Prior Functioning/Environment Level of Independence: Needs assistance  Gait / Transfers Assistance Needed: supervision to modified independent with walker/cane. Fall at home since recent hospitalization ADL's / Homemaking Assistance Needed: mod i to recent s needed since fall Communication / Swallowing Assistance Needed: no deficits          OT Problem List: Decreased strength;Decreased activity tolerance;Decreased knowledge of use of DME or AE      OT Treatment/Interventions: Self-care/ADL training;Therapeutic activities;Balance training;Therapeutic exercise;Patient/family education    OT Goals(Current goals can be found in the care plan section) Acute Rehab OT Goals Patient Stated Goal: to go to snf OT Goal Formulation: With patient Time For Goal Achievement: 08/17/20 Potential to Achieve Goals: Good  OT Frequency: Min 2X/week   Barriers to D/C:            Co-evaluation              AM-PAC OT "6 Clicks" Daily Activity     Outcome Measure Help from another person eating meals?: None Help from another person taking care of personal grooming?: A Little Help from another person toileting, which includes using toliet, bedpan, or urinal?: A Little Help from another person bathing (including washing, rinsing, drying)?: A Little Help from another person to put on and taking off regular upper body clothing?: A Little Help from another person to put on and taking off regular lower body clothing?: A Little 6 Click Score: 19   End of Session Equipment Utilized During Treatment: Rolling walker;Gait belt Nurse Communication:  (ok therapy)  Activity Tolerance: Patient tolerated treatment well Patient left: in chair;with call bell/phone within reach;with chair alarm  set;with nursing/sitter in room  OT Visit Diagnosis: Unsteadiness on feet (R26.81);Muscle weakness (generalized) (M62.81)                Time: [id number] OT Time Calculation (min): 32 min Charges:  OT General Charges $OT Visit: 1 Visit OT Evaluation $OT Eval Moderate Complexity: 1 Mod  [name] OT/L   [place] 08/03/2020, 3:59 PM

## 2020-08-03 NOTE — Evaluation (Signed)
Physical Therapy Evaluation Patient Details Name: [name] MRN: [id number] DOB: [birth year] Today's Date: 08/03/2020   History of Present Illness  Pt adm 7/30 with weakness, lethargy, bradycardia and AKI. Pt tested positive for covid on 07/25/20. Pt with recent adm to [facility] 7/10-7/20 for acute on chronic diastolic hear failure. Also with rt thigh hematoma while there. PMH - chf, afib, ckd, DM, HTN, gout  Clinical Impression  Pt presents to PT after only brief stay at home since last hospitalization. Pt also with a fall at home between his hospitalizations. Pt obviously is not thriving at home with multiple medical issues and repeat hospitalizations. Wife is also elderly and cannot provide assist. Feel pt needs SNF for further rehab to incr his independence and safety prior to return home. Pt in agreement.    Follow Up Recommendations SNF    Equipment Recommendations  None recommended by PT    Recommendations for Other Services       Precautions / Restrictions Precautions Precautions: Fall Restrictions Weight Bearing Restrictions: No      Mobility  Bed Mobility Overal bed mobility: Needs Assistance Bed Mobility: Supine to Sit     Supine to sit: Supervision;HOB elevated     General bed mobility comments: Incr time and effort    Transfers Overall transfer level: Needs assistance Equipment used: Rolling walker (2 wheeled) Transfers: Sit to/from Stand Sit to Stand: Min assist;From elevated surface         General transfer comment: Assist to bring hips up and for balance  Ambulation/Gait Ambulation/Gait assistance: Min assist Gait Distance (Feet): 90 Feet Assistive device: Rolling walker (2 wheeled) Gait Pattern/deviations: Step-through pattern;Decreased stride length;Staggering left;Wide base of support;Staggering right Gait velocity: decr Gait velocity interpretation: 1.31 - 2.62 ft/sec, indicative of limited community ambulator General Gait Details: Assist for  balance. Pt with loss of balance when turning requiring assist to correct  Stairs            Wheelchair Mobility    Modified Rankin (Stroke Patients Only)       Balance Overall balance assessment: Needs assistance Sitting-balance support: No upper extremity supported;Feet supported Sitting balance-Leahy Scale: Good     Standing balance support: No upper extremity supported Standing balance-Leahy Scale: Fair                               Pertinent Vitals/Pain Pain Assessment: No/denies pain    Home Living Family/patient expects to be discharged to:: Private residence Living Arrangements: Spouse/significant other Available Help at Discharge: Family;Available PRN/intermittently Type of Home: House Home Access: Stairs to enter   Entrance Stairs-Number of Steps: 2 Home Layout: Able to live on main level with bedroom/bathroom Home Equipment: Cane - single point;Tub bench;Walker - 2 wheels      Prior Function Level of Independence: Needs assistance   Gait / Transfers Assistance Needed: supervision to modified independent with walker/cane. Fall at home since recent hospitalization           Hand Dominance   Dominant Hand: Right    Extremity/Trunk Assessment   Upper Extremity Assessment Upper Extremity Assessment: Defer to OT evaluation    Lower Extremity Assessment Lower Extremity Assessment: Generalized weakness    Cervical / Trunk Assessment Cervical / Trunk Assessment: Normal  Communication   Communication: HOH  Cognition Arousal/Alertness: Awake/alert Behavior During Therapy: WFL for tasks assessed/performed Overall Cognitive Status: Within Functional Limits for tasks assessed  General Comments General comments (skin integrity, edema, etc.): Pt on RA with SpO2 100%    Exercises     Assessment/Plan    PT Assessment Patient needs continued PT services  PT Problem List  Decreased strength;Decreased balance;Decreased mobility;Decreased activity tolerance       PT Treatment Interventions DME instruction;Functional mobility training;Balance training;Patient/family education;Gait training;Therapeutic activities;Therapeutic exercise    PT Goals (Current goals can be found in the Care Plan section)  Acute Rehab PT Goals Patient Stated Goal: go to rehab PT Goal Formulation: With patient Time For Goal Achievement: 08/17/20 Potential to Achieve Goals: Good    Frequency Min 2X/week   Barriers to discharge Decreased caregiver support elderly wife cannot assist    Co-evaluation               AM-PAC PT "6 Clicks" Mobility  Outcome Measure Help needed turning from your back to your side while in a flat bed without using bedrails?: None Help needed moving from lying on your back to sitting on the side of a flat bed without using bedrails?: None Help needed moving to and from a bed to a chair (including a wheelchair)?: A Little Help needed standing up from a chair using your arms (e.g., wheelchair or bedside chair)?: A Little Help needed to walk in hospital room?: A Little Help needed climbing 3-5 steps with a railing? : A Little 6 Click Score: 20    End of Session Equipment Utilized During Treatment: Gait belt Activity Tolerance: Patient tolerated treatment well Patient left: in chair;with call bell/phone within reach;with chair alarm set;with nursing/sitter in room Nurse Communication: Mobility status;Other (comment) (IV site bleeding) PT Visit Diagnosis: Muscle weakness (generalized) (M62.81);History of falling (Z91.81);Unsteadiness on feet (R26.81)    Time: [id number] PT Time Calculation (min) (ACUTE ONLY): 25 min   Charges:   PT Evaluation $PT Eval Moderate Complexity: 1 Mod PT Treatments $Gait Training: 8-22 mins        [place] Pager [phone] Office [place] 08/03/2020,  2:56 PM

## 2020-08-03 NOTE — TOC Initial Note (Signed)
Transition of Care [facility]) - Initial/Assessment Note    Patient Details  Name: [name] MRN: [id number] Date of Birth: May 07, 1931  [facility] CM/SW Contact:    Trula Ore, [place] Phone Number: 08/03/2020, 5:13 PM  Clinical Narrative:                  CSW received consult for possible SNF placement at time of discharge. CSW spoke with patient and patients daughter [name] regarding PT recommendation of SNF placement at time of discharge. Patient comes from home with spouse.Patient expressed understanding of PT recommendation and is agreeable to SNF placement at time of discharge. Patient gave CSW permission to fax out initial referral near the [facility] and [place] area. Patient has received the COVID vaccines.Patients daughter reports patient has not received the booster. No further questions reported at this time. CSW to continue to follow and assist with discharge planning needs.   Expected Discharge Plan: Skilled Nursing Facility Barriers to Discharge: Continued Medical Work up   Patient Goals and CMS Choice Patient states their goals for this hospitalization and ongoing recovery are:: to go to SNF CMS Medicare.gov Compare Post Acute Care list provided to:: Patient Choice offered to / list presented to : Patient  Expected Discharge Plan and Services Expected Discharge Plan: [place] In-house Referral: Clinical Social Work     Living arrangements for the past 2 months: Single Family Home (from home with spouse)                                      Prior Living Arrangements/Services Living arrangements for the past 2 months: Single Family Home (from home with spouse) Lives with:: Spouse Patient language and need for interpreter reviewed:: Yes Do you feel safe going back to the place where you live?: No   SNF  Need for Family Participation in Patient Care: Yes (Comment) Care giver support system in place?: Yes (comment)    Criminal Activity/Legal Involvement Pertinent to Current Situation/Hospitalization: No - Comment as needed  Activities of Daily Living Home Assistive Devices/Equipment: Eyeglasses, Dentures (specify type), Cane (specify quad or straight) ADL Screening (condition at time of admission) Patient's cognitive ability adequate to safely complete daily activities?: Yes Is the patient deaf or have difficulty hearing?: Yes Does the patient have difficulty seeing, even when wearing glasses/contacts?: No Does the patient have difficulty concentrating, remembering, or making decisions?: No Patient able to express need for assistance with ADLs?: Yes Does the patient have difficulty dressing or bathing?: No Independently performs ADLs?: Yes (appropriate for developmental age) Does the patient have difficulty walking or climbing stairs?: Yes Weakness of Legs: Both Weakness of Arms/Hands: None  Permission Sought/Granted Permission sought to share information with : Case Manager, Customer service manager, Family Supports Permission granted to share information with : Yes, Verbal Permission Granted  Share Information with NAME: [name]  Permission granted to share info w AGENCY: SNF  Permission granted to share info w Relationship: daughter  Permission granted to share info w Contact Information: [name] [phone]  Emotional Assessment   Attitude/Demeanor/Rapport: Gracious Affect (typically observed): Calm Orientation: : Oriented to Self, Oriented to Place, Oriented to  Time, Oriented to Situation Alcohol / Substance Use: Not Applicable Psych Involvement: No (comment)  Admission diagnosis:  AKI (acute kidney injury) ([place]) [N17.9] Patient Active Problem List   Diagnosis Date Noted   AKI (acute kidney injury) ([place])  07/31/2020   Chronic diastolic CHF (congestive heart failure) ([place]) 07/31/2020   COVID-19 virus infection 07/26/2020   Anasarca    CAP (community acquired pneumonia)  07/12/2020   Acute on chronic diastolic CHF (congestive heart failure) ([place]) 07/12/2020   Pleural effusion due to CHF (congestive heart failure) ([place]) 07/12/2020   Hepatic congestion 07/12/2020   Acute renal failure superimposed on stage 3b chronic kidney disease ([place]) 07/12/2020   Thigh hematoma, right, initial encounter 07/12/2020   CHF (congestive heart failure) ([place]) 07/11/2020   Weight loss 06/18/2020   Age-related cataract of right eye [id number]   Diastolic dysfunction [id number]   Obesity (BMI 30-39.9) 02/28/2018   B12 deficiency 02/28/2018   Chronic constipation 04/29/2015   Soft tissue mass 04/29/2015   Chronic venous insufficiency    Encounter for therapeutic drug monitoring 05/28/2014   Essential hypertension 03/07/2014   Preventative health care 03/07/2014   Varicose veins of lower extremities with other complications 0000000   Diabetes mellitus without complication ([place]) [id number]   Normocytic anemia 08/26/2013   Hemorrhoid 02/19/2008   Motion sickness 01/16/2008   Swelling, mass, or lump in head and neck 11/12/2007   Hyperlipidemia 08/13/2006   GOUT 08/13/2006   PCP:  Midge Minium, MD Pharmacy:   [id number] [place], [place] - [address] [place] [id number]: [phone]: [phone]    Social Determinants of Health (SDOH) Interventions    Readmission Risk Interventions Readmission Risk Prevention Plan 07/12/2020  Transportation Screening Complete  PCP or Specialist Appt within 5-7 Days Complete  Home Care Screening Complete  Medication Review (RN CM) Complete  Some recent data might be hidden

## 2020-08-03 NOTE — Progress Notes (Addendum)
PROGRESS NOTE    [name]  [id number] DOB: [birth year] DOA: 07/31/2020 PCP: Midge Minium, MD   Chief Complaint  Patient presents with   Bradycardia   Brief Narrative: 87 y.o. male with medical history significant for chronic diastolic CHF, CKD stage IIIb baselien creat 1.4-1.5, hypertension, HLD, obesity,type 2 diabetes mellitus, chronic venous insufficiency, A. Fib, recent admission 7/10-/720 for acute on chronic diastolic CHF, right thigh hematoma while on Coumadin> wt on d/c 214 lb, 233 lb on admission, syndrome on Eliquis, torsemide brought to the hospital due to progressive generalized weakness lethargy. Upon his discharge patient started to have weakness, difficulty getting up, sometimes confused, constipation He tested positive for COVID on 07/25/20.  No report of shortness of breath hypoxia fever focal weakness.  Apparently  EMS reported bradycardic to the 20s on the cardiac monitor but unable to capture/no records available.   ED Course: Hemodynamically stable, saturating well on room air, tested positive for COVID-19 negative, BNP 521 troponin 23 mild lactic acidosis that resolved to 1.2, UA fairly normal, TSH 1.4.  Chest x-ray showed decreased left pleural fluid with persistent atelectasis/pneumonia in the left mid and lower lung zones.  Patient was complaining of chronic constipation and CT abdomen pelvis was done that showed-renal acute finding, left effusion with underlying opacity identified right effusion resolved.  There is question of facial droop CT head was unremarkable MRI head pending.  Labs showed AKI worse from baseline admission has been requested for further management.  Subjective: Resting comfortably feels much better.  No new complaints.  Assessment & Plan:  AKI on CKD on stage IIIb Baseline creatinine 1.4-1.5.  on admission creatinine 2.5.  AKI multifactorial due to overdiuresis from recent CHF admission, decreased p.o. intake, COVID-19 infection.   Renal function improving on gentle IV fluid hydration, continue to hold torsemide.  Repeat BMP in AM.  Recent Labs  Lab 07/31/20 1045 08/01/20 0438 08/02/20 0113 08/03/20 0224  BUN 46* 44* 39* 31*  CREATININE 2.30* 2.29* 1.94* 1.68*   Orthostatic hypotension 7/31blood pressure 79/46 standing and 112/44 lying: Given 500 ml NS bolus and continuous IV fluids.  At this time hemodynamically stable obtain PT OT evaluation monitor orthostatics.   Dehydration/lactic acidosis resolved with IV fluids..   Borderline positive troponin suspect in the setting of CHF no delta delta.  No chest pain.   Generalized weakness/failure to thrive: Due to above AKI dehydration.  PT OT evaluation.    ?  Facial droop in the ED however likely from weakness related, no evidence of stroke on MRI.    Mild confusion?  Sundowning: Daughter reports he has been calling them and at times confused evening and night.  We will try low-dose Seroquel bedtime-discussed with the daughter.  PT OT eval will likely need placement  A. [id number] controlled, continue Eliquis.  Metoprolol remains on hold can likely resume at lower dose.? hr 20s heart rate per EMS here on telemetry overall stable no episode of bradycardia of such degree   Hyperlipidemia:  on statin   Diabetes mellitus without complication:stable,recent A1c was normal at 5.6 on 7/10   Essential hypertension: Blood pressure is stable.  Holding metoprolol.  Continue IV fluids   COVID-19 virus infection: Largely asymptomatic no respiratory symptoms mostly with generalized weakness.  CRP normal D-dimer somewhat positive but on Eliquis already.  Status post remdesivir x3 days.    Chronic diastolic CHF: Currently hypovolemic needing IV fluid hydration.  Has chronic appearing leg edema not worsening.  Holding diuretics  Chronic constipation:No acute finding on the CT, continue MiraLAX daily along with as needed Dulcolax and as needed Fleet enema for severe constipation.   BM x1 charted 7/30-31.   Right upper extremity arm wound wound care has been consulted continue local wound dressing.    Diet Order             Diet 2 gram sodium Room service appropriate? Yes; Fluid consistency: Thin  Diet effective now                  Patient's Body mass index is 29.23 kg/m. DVT prophylaxis: apixaban (ELIQUIS) tablet 2.5 mg Start: 07/31/20 2200 SCDs Start: 07/31/20 1722 Code Status:   Code Status: DNR  Family Communication: plan of care discussed with patient at bedside.  Updated his daughter Jackelyn Poling over the phone 8/1. I called [name] today no answer. Able to talk to [name] she would like the patient to go to skilled nursing facility as he lives alone and unable to take care of himself  Status is: Inpatient  Patient remains hospitalized will need at least 2 midnight stay due to management of AKI and CKD, orthostatic hypotension, severe dehydration  Dispo: The patient is from: Home              Anticipated d/c is to:  PT OT evaluation pending.  Daughter hoping he will go to SNF it appears patient will not want to go to SNF              Patient currently is not medically stable to d/c.   Difficult to place patient No  Unresulted Labs (From admission, onward)     Start     Ordered   08/02/20 [id number]  Basic metabolic panel  Daily,   R     Question:  Specimen collection method  Answer:  Lab=Lab collect   08/01/20 1131   07/31/20 1230  Differential  Once,   STAT        07/31/20 1236           Medications reviewed:  Scheduled Meds:  albuterol  2 puff Inhalation Q6H   allopurinol  300 mg Oral Daily   apixaban  2.5 mg Oral BID   docusate sodium  100 mg Oral BID   polyethylene glycol  17 g Oral Daily   simvastatin  20 mg Oral q1800   vitamin B-12  500 mcg Oral Daily   Continuous Infusions:  sodium chloride 100 mL/hr at 08/03/20 0648   sodium chloride Stopped (08/02/20 2101)   Consultants:see note  Procedures:see  note Antimicrobials: Anti-infectives (From admission, onward)    Start     Dose/Rate Route Frequency Ordered Stop   08/01/20 1000  remdesivir 100 mg in sodium chloride 0.9 % 100 mL IVPB       See Hyperspace for full Linked Orders Report.   100 mg 200 mL/hr over 30 Minutes Intravenous Daily 07/31/20 1812 08/02/20 1052   07/31/20 1912  remdesivir 200 mg in sodium chloride 0.9% 250 mL IVPB       See Hyperspace for full Linked Orders Report.   200 mg 580 mL/hr over 30 Minutes Intravenous Once 07/31/20 1812 07/31/20 2211      Culture/Microbiology    Component Value Date/Time   SDES URINE, CLEAN CATCH 07/31/2020 1232   SPECREQUEST NONE 07/31/2020 1232   CULT  07/31/2020 1232    NO GROWTH Performed at [facility], [place] [address], [place], [place] [address]  REPTSTATUS 08/01/2020 FINAL 07/31/2020 1232    Other culture-see note  Objective: Vitals: Today's Vitals   08/02/20 2334 08/03/20 0512 08/03/20 0800 08/03/20 1201  BP: 125/61 (!) 131/52    Pulse: 76 82    Resp: 18 18    Temp: (!) 97.4 F (36.3 C) 97.9 F (36.6 C)    TempSrc: Oral Oral    SpO2: 100%     Weight:  92.4 kg    Height:      PainSc: 0-No pain 0-No pain 0-No pain 0-No pain    Intake/Output Summary (Last 24 hours) at 08/03/2020 1331 Last data filed at 08/03/2020 0348 Gross per 24 hour  Intake 1482.72 ml  Output --  Net 1482.72 ml    Filed Weights   07/31/20 1800 08/02/20 0415 08/03/20 0512  Weight: 87.5 kg 87.3 kg 92.4 kg   Weight change: 5.128 kg  Intake/Output from previous day: 08/01 0701 - 08/02 0700 In: 1962.7 [P.O.:720; I.V.:1242.7] Out: -  Intake/Output this shift: No intake/output data recorded. Filed Weights   07/31/20 1800 08/02/20 0415 08/03/20 0512  Weight: 87.5 kg 87.3 kg 92.4 kg   Examination: General exam: AAOx 3 older than stated age, weak appearing. HEENT:Oral mucosa moist, Ear/Nose WNL grossly, dentition normal. Respiratory system: bilaterally diminished,  no use of  accessory muscle Cardiovascular system: S1 & S2 +, No JVD,. Gastrointestinal system: Abdomen soft,NT,ND, BS+ Nervous System:Alert, awake, moving extremities and grossly nonfocal Extremities: Chronic appearing stable leg edema, distal peripheral pulses palpable.  Skin: No rashes,no icterus. MSK: Normal muscle bulk,tone, power   Data Reviewed: I have personally reviewed following labs and imaging studies CBC: Recent Labs  Lab 07/31/20 1045 07/31/20 1251 08/01/20 0029 08/03/20 0224  WBC 5.4 5.2 5.5 5.0  NEUTROABS  --  2.9 3.7 3.1  HGB 10.5* 10.8* 10.2* 9.8*  HCT 33.0* 34.0* 32.1* 30.8*  MCV 98.2 97.1 96.7 96.9  PLT 235 227 209 [id number]    Basic Metabolic Panel: Recent Labs  Lab 07/31/20 1045 08/01/20 0438 08/02/20 0113 08/03/20 0224  NA 138 140 141 140  K 3.5 3.6 3.6 3.9  CL 97* 97* 102 104  CO2 32 32 30 28  GLUCOSE 117* 100* 91 89  BUN 46* 44* 39* 31*  CREATININE 2.30* 2.29* 1.94* 1.68*  CALCIUM 9.8 9.7 9.0 9.0    GFR: Estimated Creatinine Clearance: 35.4 mL/min (A) (by C-G formula based on SCr of 1.68 mg/dL (H)). Liver Function Tests: Recent Labs  Lab 07/31/20 1251 08/01/20 0438  AST 22 28  ALT 11 10  ALKPHOS 79 77  BILITOT 1.7* 1.1  PROT 6.4* 6.2*  ALBUMIN 2.7* 2.6*    No results for input(s): LIPASE, AMYLASE in the last 168 hours. Recent Labs  Lab 07/31/20 1251  AMMONIA 33    Coagulation Profile: No results for input(s): INR, PROTIME in the last 168 hours. Cardiac Enzymes: No results for input(s): CKTOTAL, CKMB, CKMBINDEX, TROPONINI in the last 168 hours. BNP (last 3 results) No results for input(s): PROBNP in the last 8760 hours. HbA1C: No results for input(s): HGBA1C in the last 72 hours. CBG: No results for input(s): GLUCAP in the last 168 hours. Lipid Profile: No results for input(s): CHOL, HDL, LDLCALC, TRIG, CHOLHDL, LDLDIRECT in the last 72 hours. Thyroid Function Tests: No results for input(s): TSH, T4TOTAL, FREET4, T3FREE, THYROIDAB in  the last 72 hours.  Anemia Panel: No results for input(s): VITAMINB12, FOLATE, FERRITIN, TIBC, IRON, RETICCTPCT in the last 72 hours. Sepsis Labs: Recent Labs  Lab 07/31/20 1251 07/31/20 1512  LATICACIDVEN 2.1* 1.8     Recent Results (from the past 240 hour(s))  Resp Panel by RT-PCR (Flu A&B, Covid) Nasopharyngeal Swab     Status: Abnormal   Collection Time: 07/31/20 11:08 AM   Specimen: Nasopharyngeal Swab; Nasopharyngeal(NP) swabs in vial transport medium  Result Value Ref Range Status   SARS Coronavirus 2 by RT PCR POSITIVE (A) NEGATIVE Final    Comment: RESULT CALLED TO, READ BACK BY AND VERIFIED WITH: RN S.WOODWORTH ON [id number] AT 1240 BY E.PARRISH (NOTE) SARS-CoV-2 target nucleic acids are DETECTED.  The SARS-CoV-2 RNA is generally detectable in upper respiratory specimens during the acute phase of infection. Positive results are indicative of the presence of the identified virus, but do not rule out bacterial infection or co-infection with other pathogens not detected by the test. Clinical correlation with patient history and other diagnostic information is necessary to determine patient infection status. The expected result is Negative.  Fact Sheet for Patients: EntrepreneurPulse.com.au  Fact Sheet for Healthcare Providers: IncredibleEmployment.be  This test is not yet approved or cleared by the [place] FDA and  has been authorized for detection and/or diagnosis of SARS-CoV-2 by FDA under an Emergency Use Authorization (EUA).  This EUA will remain in effect (meaning th is test can be used) for the duration of  the COVID-19 declaration under Section 564(b)(1) of the Act, 21 U.S.C. section 360bbb-3(b)(1), unless the authorization is terminated or revoked sooner.     Influenza A by PCR NEGATIVE NEGATIVE Final   Influenza B by PCR NEGATIVE NEGATIVE Final    Comment: (NOTE) The Xpert Xpress SARS-CoV-2/FLU/RSV plus assay is  intended as an aid in the diagnosis of influenza from Nasopharyngeal swab specimens and should not be used as a sole basis for treatment. Nasal washings and aspirates are unacceptable for Xpert Xpress SARS-CoV-2/FLU/RSV testing.  Fact Sheet for Patients: EntrepreneurPulse.com.au  Fact Sheet for Healthcare Providers: IncredibleEmployment.be  This test is not yet approved or cleared by the [place] FDA and has been authorized for detection and/or diagnosis of SARS-CoV-2 by FDA under an Emergency Use Authorization (EUA). This EUA will remain in effect (meaning this test can be used) for the duration of the COVID-19 declaration under Section 564(b)(1) of the Act, 21 U.S.C. section 360bbb-3(b)(1), unless the authorization is terminated or revoked.  Performed at [facility], [place] [address], [place], [place] [address]   Urine Culture     Status: None   Collection Time: 07/31/20 12:32 PM   Specimen: Urine, Clean Catch  Result Value Ref Range Status   Specimen Description URINE, CLEAN CATCH  Final   Special Requests NONE  Final   Culture   Final    NO GROWTH Performed at [facility], [place] [address], [place], [place] [address]    Report Status 08/01/2020 FINAL  Final      Radiology Studies: No results found.   LOS: 2 days   Antonieta Pert, MD [facility]  08/03/2020, 1:31 PM

## 2020-08-04 DIAGNOSIS — N179 Acute kidney failure, unspecified: Secondary | ICD-10-CM | POA: Diagnosis not present

## 2020-08-04 LAB — BASIC METABOLIC PANEL
Anion gap: 7 (ref 5–15)
BUN: 25 mg/dL — ABNORMAL HIGH (ref 8–23)
CO2: 27 mmol/L (ref 22–32)
Calcium: 8.7 mg/dL — ABNORMAL LOW (ref 8.9–10.3)
Chloride: 104 mmol/L (ref 98–111)
Creatinine, Ser: 1.56 mg/dL — ABNORMAL HIGH (ref 0.61–1.24)
GFR, Estimated: 43 mL/min — ABNORMAL LOW (ref >60)
Glucose, Bld: 97 mg/dL (ref 70–99)
Potassium: 3.9 mmol/L (ref 3.5–5.1)
Sodium: 138 mmol/L (ref 135–145)

## 2020-08-04 NOTE — Progress Notes (Signed)
Per dayshift RN [place]...pt lost IV access during dayshift, skin tore when removing tape & only possible vein is on right hand. IV not replaced because it is at a high risk for infection. PO fluid intake of 480 since 19:00. On call MD notified. Dayshift RN Janett Billow updated.

## 2020-08-04 NOTE — Plan of Care (Signed)
Problem: Education: Goal: Knowledge of General Education information will improve Description: Including pain rating scale, medication(s)/side effects and non-pharmacologic comfort measures Outcome: Progressing   Problem: Health Behavior/Discharge Planning: Goal: Ability to manage health-related needs will improve Outcome: Progressing   Problem: Clinical Measurements: Goal: Ability to maintain clinical measurements within normal limits will improve Outcome: Progressing Goal: Will remain free from infection Outcome: Progressing Goal: Respiratory complications will improve Outcome: Progressing Goal: Cardiovascular complication will be avoided Outcome: Progressing   Problem: Activity: Goal: Risk for activity intolerance will decrease Outcome: Progressing   Problem: Nutrition: Goal: Adequate nutrition will be maintained Outcome: Progressing   Problem: Coping: Goal: Level of anxiety will decrease Outcome: Progressing   Problem: Pain Managment: Goal: General experience of comfort will improve Outcome: Progressing   Problem: Safety: Goal: Ability to remain free from injury will improve Outcome: Progressing   Problem: Skin Integrity: Goal: Risk for impaired skin integrity will decrease Outcome: Progressing   

## 2020-08-04 NOTE — TOC Progression Note (Signed)
Transition of Care [facility]) - Progression Note    Patient Details  Name: TARYLL SZYMBORSKI MRN: [id number] Date of Birth: January 31, 1931  Transition of Care [facility]) CM/SW [place], [place] Phone Number: 08/04/2020, 3:24 PM  Clinical Narrative:     CSW spoke with patient's daughter,Stephanie- informed of bed offers. She states her preference is a 4-5 star facility. She was agreeable to CSW reaching out to [place], [place] and [place].   CSW contacted [facility] confirmed they have availability. [name] accepted bed offer and was agreeable to CSW starting the insurance authorization process.   CSW will continue to follow and assist with discharge planning.  Thurmond Butts, MSW, LCSW Clinical Social Worker    Expected Discharge Plan: Skilled Nursing Facility Barriers to Discharge: Continued Medical Work up  Expected Discharge Plan and Services Expected Discharge Plan: [place] In-house Referral: Clinical Social Work     Living arrangements for the past 2 months: Single Family Home (from home with spouse)                                       Social Determinants of Health (SDOH) Interventions    Readmission Risk Interventions Readmission Risk Prevention Plan 07/12/2020  Transportation Screening Complete  PCP or Specialist Appt within 5-7 Days Complete  Home Care Screening Complete  Medication Review (RN CM) Complete  Some recent data might be hidden

## 2020-08-04 NOTE — TOC Progression Note (Signed)
Transition of Care [facility]) - Progression Note    Patient Details  Name: [name] MRN: [id number] Date of Birth: [birth year]  Transition of Care [facility]) CM/SW [place], [place] Phone Number: 08/04/2020, 2:35 PM  Clinical Narrative:     CSW spoke with patient's daughter,Debbie- informed of bed offers- she will review and inform CSW by 4:30 today of SNF choice.  Thurmond Butts, MSW, LCSW Clinical Social Worker    Expected Discharge Plan: Skilled Nursing Facility Barriers to Discharge: Continued Medical Work up  Expected Discharge Plan and Services Expected Discharge Plan: [place] In-house Referral: Clinical Social Work     Living arrangements for the past 2 months: Single Family Home (from home with spouse)                                       Social Determinants of Health (SDOH) Interventions    Readmission Risk Interventions Readmission Risk Prevention Plan 07/12/2020  Transportation Screening Complete  PCP or Specialist Appt within 5-7 Days Complete  Home Care Screening Complete  Medication Review (RN CM) Complete  Some recent data might be hidden

## 2020-08-04 NOTE — Progress Notes (Signed)
PROGRESS NOTE    ABRAN Edwards  [id number] DOB: 08/10/1931 DOA: 07/31/2020 PCP: Midge Minium, MD   Chief Complaint  Patient presents with   Bradycardia   Brief Narrative: 87 y.o. male with medical history significant for chronic diastolic CHF, CKD stage IIIb baselien creat 1.4-1.5, hypertension, HLD, obesity,type 2 diabetes mellitus, chronic venous insufficiency, A. Fib, recent admission 7/10-/720 for acute on chronic diastolic CHF, right thigh hematoma while on Coumadin> wt on d/c 214 lb, 233 lb on admission, syndrome on Eliquis, torsemide brought to the hospital due to progressive generalized weakness lethargy. Upon his discharge patient started to have weakness, difficulty getting up, sometimes confused, constipation He tested positive for COVID on 07/25/20.  No report of shortness of breath hypoxia fever focal weakness.  Apparently  EMS reported bradycardic to the 20s on the cardiac monitor but unable to capture/no records available.   ED Course: Hemodynamically stable, saturating well on room air, tested positive for COVID-19 negative, BNP 521 troponin 23 mild lactic acidosis that resolved to 1.2, UA fairly normal, TSH 1.4.  Chest x-ray showed decreased left pleural fluid with persistent atelectasis/pneumonia in the left mid and lower lung zones.  Patient was complaining of chronic constipation and CT abdomen pelvis was done that showed-renal acute finding, left effusion with underlying opacity identified right effusion resolved.  There is question of facial droop CT head was unremarkable MRI head pending.  Labs showed AKI worse from baseline admission has been requested for further management. Patient is admitted torsemide held management-hydration creatinine has improved close to baseline.  Off IV fluids.  Had issues with difficult IV access encouraging oral intake  Subjective: Seen and examined this morning he is alert awake at baseline.  No new complaints. Had issues with  difficult IV access encouraging oral intake  Assessment & Plan:  AKI on CKD on stage IIIb Baseline creatinine 1.4-1.5.  on admission creatinine 2.5.  AKI multifactorial due to overdiuresis from recent CHF admission, decreased p.o. intake, COVID-19 infection.  Renal function improvied on gentle IV fluid hydration. Now had issues with difficult IV access encouraging oral intake continue to hold torsemide.  Off IV fluids. Recent Labs  Lab 07/31/20 1045 08/01/20 0438 08/02/20 0113 08/03/20 0224 08/04/20 0145  BUN 46* 44* 39* 31* 25*  CREATININE 2.30* 2.29* 1.94* 1.68* 1.56*   Orthostatic hypotension 7/31blood pressure 79/46 standing and 112/44 lying: Given 500 ml NS bolus and continuous IV fluids.  Off IV fluids.  Monitor blood pressure PT OT to continue   Dehydration/lactic acidosis resolved with IV fluids.   Borderline positive troponin suspect in the setting of CHF no delta delta.  No chest pain.   Generalized weakness/failure to thrive: In the setting of AKI dehydration.  Continue PT OT, will need a skilled nursing facility  ?  Facial droop in the ED however likely from weakness related, no evidence of stroke on MRI.    Mild confusion?  Sundowning: Daughter reports he has been calling them and at times confused evening and night.  Today he appears fairly oriented at baseline.  He is placed on Seroquel bedtime low-dose 8/2.  Continue ambulation PT OT  A. [id number] controlled, continue Eliquis.  Metoprolol remains on hold can likely resume at lower dose.? hr 20s heart rate per EMS here on telemetry overall stable,no episode of bradycardia of such degree   Hyperlipidemia:  on statin   Diabetes mellitus without complication:stable,recent A1c was normal at 5.6 on 7/10   Essential hypertension: Blood pressure is  stable.  Holding metoprolol.  Continue IV fluids   COVID-19 virus infection: Largely asymptomatic no respiratory symptoms mostly with generalized weakness.  CRP normal D-dimer  somewhat positive but on Eliquis already.  Status post remdesivir x3 days.    Chronic diastolic CHF: Currently volume status is stable euvolemic, off IV fluids.  Holding torsemide for 1 more day hopefully resume at lower dose  or change to lasix.   Chronic constipation:No acute finding on the CT, continue MiraLAX daily along with as needed Dulcolax and as needed Fleet enema for severe constipation.  BM x2  8/2/-3   Right upper extremity arm wound wound care has been consulted .continue wound care.    Diet Order             Diet 2 gram sodium Room service appropriate? Yes; Fluid consistency: Thin  Diet effective now                  Patient's Body mass index is 29.23 kg/m. DVT prophylaxis: apixaban (ELIQUIS) tablet 2.5 mg Start: 07/31/20 2200 SCDs Start: 07/31/20 1722 Code Status:   Code Status: DNR  Family Communication: plan of care discussed with patient at bedside.  Updated his daughter Jackelyn Poling over the phone 8/1. I called [name] today no answer. Able to talk to [name] she would like the patient to go to skilled nursing facility as he lives alone and unable to take care of himself  Status is: Inpatient  Patient remains hospitalized will need at least 2 midnight stay due to management of AKI and CKD, orthostatic hypotension, severe dehydration  Dispo: The patient is from: Home              Anticipated d/c is to: Skilled nursing facility once bed available              Patient currently currently medically stable for discharge   Difficult to place patient No  Unresulted Labs (From admission, onward)     Start     Ordered   07/31/20 1230  Differential  Once,   STAT        07/31/20 1236           Medications reviewed:  Scheduled Meds:  albuterol  2 puff Inhalation Q6H   allopurinol  300 mg Oral Daily   apixaban  2.5 mg Oral BID   docusate sodium  100 mg Oral BID   polyethylene glycol  17 g Oral Daily   QUEtiapine  12.5 mg Oral QHS   simvastatin  20 mg Oral  q1800   vitamin B-12  500 mcg Oral Daily   Continuous Infusions:  sodium chloride Stopped (08/02/20 2101)   Consultants:see note  Procedures:see note Antimicrobials: Anti-infectives (From admission, onward)    Start     Dose/Rate Route Frequency Ordered Stop   08/01/20 1000  remdesivir 100 mg in sodium chloride 0.9 % 100 mL IVPB       See Hyperspace for full Linked Orders Report.   100 mg 200 mL/hr over 30 Minutes Intravenous Daily 07/31/20 1812 08/02/20 1052   07/31/20 1912  remdesivir 200 mg in sodium chloride 0.9% 250 mL IVPB       See Hyperspace for full Linked Orders Report.   200 mg 580 mL/hr over 30 Minutes Intravenous Once 07/31/20 1812 07/31/20 2211      Culture/Microbiology    Component Value Date/Time   SDES URINE, CLEAN CATCH 07/31/2020 1232   SPECREQUEST NONE 07/31/2020 1232   CULT  07/31/2020  [place] Performed at [facility], [place] [address], [place], [place] [address]    REPTSTATUS 08/01/2020 FINAL 07/31/2020 1232    Other culture-see note  Objective: Vitals: Today's Vitals   08/03/20 2011 08/03/20 2046 08/04/20 0428 08/04/20 0754  BP: (!) 145/55  (!) 134/49   Pulse: 76  83   Resp: 16  18   Temp: 98.4 F (36.9 C)  97.7 F (36.5 C)   TempSrc: Oral  Oral   SpO2: 100%  100%   Weight:      Height:      PainSc:  0-No pain  0-No pain    Intake/Output Summary (Last 24 hours) at 08/04/2020 1050 Last data filed at 08/04/2020 0000 Gross per 24 hour  Intake 1429.08 ml  Output --  Net 1429.08 ml    Filed Weights   07/31/20 1800 08/02/20 0415 08/03/20 0512  Weight: 87.5 kg 87.3 kg 92.4 kg   Weight change:   Intake/Output from previous day: 08/02 0701 - 08/03 0700 In: 1429.1 [P.O.:480; I.V.:949.1] Out: -  Intake/Output this shift: No intake/output data recorded. Filed Weights   07/31/20 1800 08/02/20 0415 08/03/20 0512  Weight: 87.5 kg 87.3 kg 92.4 kg   Examination: General exam: AAOx at baseline not in acute  distress, HEENT:Oral mucosa moist, Ear/Nose WNL grossly, dentition normal. Respiratory system: bilaterally diminished,  no use of accessory muscle Cardiovascular system: S1 & S2 +, No JVD,. Gastrointestinal system: Abdomen soft,NT,ND, BS+ Nervous System:Alert, awake, moving extremities and grossly nonfocal Extremities: Mild chronic appearing leg  edema, distal peripheral pulses palpable.  Skin: No rashes,no icterus. MSK: Normal muscle bulk,tone, power.   Data Reviewed: I have personally reviewed following labs and imaging studies CBC: Recent Labs  Lab 07/31/20 1045 07/31/20 1251 08/01/20 0029 08/03/20 0224  WBC 5.4 5.2 5.5 5.0  NEUTROABS  --  2.9 3.7 3.1  HGB 10.5* 10.8* 10.2* 9.8*  HCT 33.0* 34.0* 32.1* 30.8*  MCV 98.2 97.1 96.7 96.9  PLT 235 227 209 [id number]    Basic Metabolic Panel: Recent Labs  Lab 07/31/20 1045 08/01/20 0438 08/02/20 0113 08/03/20 0224 08/04/20 0145  NA 138 140 141 140 138  K 3.5 3.6 3.6 3.9 3.9  CL 97* 97* 102 104 104  CO2 32 32 '30 28 27  '$ GLUCOSE 117* 100* 91 89 97  BUN 46* 44* 39* 31* 25*  CREATININE 2.30* 2.29* 1.94* 1.68* 1.56*  CALCIUM 9.8 9.7 9.0 9.0 8.7*    GFR: Estimated Creatinine Clearance: 38.1 mL/min (A) (by C-G formula based on SCr of 1.56 mg/dL (H)). Liver Function Tests: Recent Labs  Lab 07/31/20 1251 08/01/20 0438  AST 22 28  ALT 11 10  ALKPHOS 79 77  BILITOT 1.7* 1.1  PROT 6.4* 6.2*  ALBUMIN 2.7* 2.6*    No results for input(s): LIPASE, AMYLASE in the last 168 hours. Recent Labs  Lab 07/31/20 1251  AMMONIA 33    Coagulation Profile: No results for input(s): INR, PROTIME in the last 168 hours. Cardiac Enzymes: No results for input(s): CKTOTAL, CKMB, CKMBINDEX, TROPONINI in the last 168 hours. BNP (last 3 results) No results for input(s): PROBNP in the last 8760 hours. HbA1C: No results for input(s): HGBA1C in the last 72 hours. CBG: No results for input(s): GLUCAP in the last 168 hours. Lipid Profile: No  results for input(s): CHOL, HDL, LDLCALC, TRIG, CHOLHDL, LDLDIRECT in the last 72 hours. Thyroid Function Tests: No results for input(s): TSH, T4TOTAL, FREET4, T3FREE, THYROIDAB  in the last 72 hours.  Anemia Panel: No results for input(s): VITAMINB12, FOLATE, FERRITIN, TIBC, IRON, RETICCTPCT in the last 72 hours. Sepsis Labs: Recent Labs  Lab 07/31/20 1251 07/31/20 1512  LATICACIDVEN 2.1* 1.8     Recent Results (from the past 240 hour(s))  Resp Panel by RT-PCR (Flu A&B, Covid) Nasopharyngeal Swab     Status: Abnormal   Collection Time: 07/31/20 11:08 AM   Specimen: Nasopharyngeal Swab; Nasopharyngeal(NP) swabs in vial transport medium  Result Value Ref Range Status   SARS Coronavirus 2 by RT PCR POSITIVE (A) NEGATIVE Final    Comment: RESULT CALLED TO, READ BACK BY AND VERIFIED WITH: RN S.WOODWORTH ON [id number] AT 1240 BY E.PARRISH (NOTE) SARS-CoV-2 target nucleic acids are DETECTED.  The SARS-CoV-2 RNA is generally detectable in upper respiratory specimens during the acute phase of infection. Positive results are indicative of the presence of the identified virus, but do not rule out bacterial infection or co-infection with other pathogens not detected by the test. Clinical correlation with patient history and other diagnostic information is necessary to determine patient infection status. The expected result is Negative.  Fact Sheet for Patients: EntrepreneurPulse.com.au  Fact Sheet for Healthcare Providers: IncredibleEmployment.be  This test is not yet approved or cleared by the [place] FDA and  has been authorized for detection and/or diagnosis of SARS-CoV-2 by FDA under an Emergency Use Authorization (EUA).  This EUA will remain in effect (meaning th is test can be used) for the duration of  the COVID-19 declaration under Section 564(b)(1) of the Act, 21 U.S.C. section 360bbb-3(b)(1), unless the authorization is terminated or  revoked sooner.     Influenza A by PCR NEGATIVE NEGATIVE Final   Influenza B by PCR NEGATIVE NEGATIVE Final    Comment: (NOTE) The Xpert Xpress SARS-CoV-2/FLU/RSV plus assay is intended as an aid in the diagnosis of influenza from Nasopharyngeal swab specimens and should not be used as a sole basis for treatment. Nasal washings and aspirates are unacceptable for Xpert Xpress SARS-CoV-2/FLU/RSV testing.  Fact Sheet for Patients: EntrepreneurPulse.com.au  Fact Sheet for Healthcare Providers: IncredibleEmployment.be  This test is not yet approved or cleared by the [place] FDA and has been authorized for detection and/or diagnosis of SARS-CoV-2 by FDA under an Emergency Use Authorization (EUA). This EUA will remain in effect (meaning this test can be used) for the duration of the COVID-19 declaration under Section 564(b)(1) of the Act, 21 U.S.C. section 360bbb-3(b)(1), unless the authorization is terminated or revoked.  Performed at [facility], [place] [address] [place], [place] [address]   Urine Culture     Status: None   Collection Time: 07/31/20 12:32 PM   Specimen: Urine, Clean Catch  Result Value Ref Range Status   Specimen Description URINE, CLEAN CATCH  Final   Special Requests NONE  Final   Culture   Final    NO GROWTH Performed at [facility], [place] [address], [place], [place] [address]    Report Status 08/01/2020 FINAL  Final      Radiology Studies: No results found.   LOS: 3 days   Antonieta Pert, MD [facility]  08/04/2020, 10:50 AM

## 2020-08-04 NOTE — TOC Progression Note (Signed)
Transition of Care [facility]) - Progression Note    Patient Details  Name: [name] MRN: [id number] Date of Birth: 1931-06-09  [facility] CM/SW [place], [place] Phone Number: 08/04/2020, 4:36 PM  Clinical Narrative:     CSW informed SNF /[facility] - they must start authorization. SNF will start auth today.  Patient will need insurance authorization before d/c to SNF  Thurmond Butts, MSW, LCSW Clinical Social Worker    Expected Discharge Plan: Skilled Nursing Facility Barriers to Discharge: Continued Medical Work up  Expected Discharge Plan and Services Expected Discharge Plan: [employer] In-house Referral: Clinical Social Work     Living arrangements for the past 2 months: Single Family Home (from home with spouse)                                       Social Determinants of Health (SDOH) Interventions    Readmission Risk Interventions Readmission Risk Prevention Plan 07/12/2020  Transportation Screening Complete  PCP or Specialist Appt within 5-7 Days Complete  Home Care Screening Complete  Medication Review (RN CM) Complete  Some recent data might be hidden

## 2020-08-05 DIAGNOSIS — N179 Acute kidney failure, unspecified: Secondary | ICD-10-CM | POA: Diagnosis not present

## 2020-08-05 LAB — BASIC METABOLIC PANEL
Anion gap: 8 (ref 5–15)
BUN: 22 mg/dL (ref 8–23)
CO2: 25 mmol/L (ref 22–32)
Calcium: 8.6 mg/dL — ABNORMAL LOW (ref 8.9–10.3)
Chloride: 108 mmol/L (ref 98–111)
Creatinine, Ser: 1.61 mg/dL — ABNORMAL HIGH (ref 0.61–1.24)
GFR, Estimated: 41 mL/min — ABNORMAL LOW (ref >60)
Glucose, Bld: 91 mg/dL (ref 70–99)
Potassium: 4.2 mmol/L (ref 3.5–5.1)
Sodium: 141 mmol/L (ref 135–145)

## 2020-08-05 NOTE — Progress Notes (Signed)
PROGRESS NOTE    [name]  [id number] DOB: [birth year] DOA: 07/31/2020 PCP: Midge Minium, MD   Chief Complaint  Patient presents with   Bradycardia   Brief Narrative: 87 y.o. male with medical history significant for chronic diastolic CHF, CKD stage IIIb baselien creat 1.4-1.5, hypertension, HLD, obesity,type 2 diabetes mellitus, chronic venous insufficiency, A. Fib, recent admission 7/10-/720 for acute on chronic diastolic CHF, right thigh hematoma while on Coumadin> wt on d/c 214 lb, 233 lb on admission, syndrome on Eliquis, torsemide brought to the hospital due to progressive generalized weakness lethargy. Upon his discharge patient started to have weakness, difficulty getting up, sometimes confused, constipation He tested positive for COVID on 07/25/20.  No report of shortness of breath hypoxia fever focal weakness.  Apparently  EMS reported bradycardic to the 20s on the cardiac monitor but unable to capture/no records available.   ED Course: Hemodynamically stable, saturating well on room air, tested positive for COVID-19 negative, BNP 521 troponin 23 mild lactic acidosis that resolved to 1.2, UA fairly normal, TSH 1.4.  Chest x-ray showed decreased left pleural fluid with persistent atelectasis/pneumonia in the left mid and lower lung zones.  Patient was complaining of chronic constipation and CT abdomen pelvis was done that showed-renal acute finding, left effusion with underlying opacity identified right effusion resolved.  There is question of facial droop CT head was unremarkable MRI head pending.  Labs showed AKI worse from baseline admission has been requested for further management. Patient is admitted torsemide held management-hydration creatinine has improved close to baseline.  Off IV fluids.  Had issues with difficult IV access encouraging oral intake  Subjective: Not much voiding per nursing.  Patient reports he is drinking okay.  Denies any new  complaints. Creatinine on repeat lab more or less same 1.6  Assessment & Plan:  AKI on CKD on stage IIIb Baseline creatinine 1.4-1.5.  on admission creatinine 2.5.  AKI multifactorial due to overdiuresis from recent CHF admission, decreased p.o. intake, COVID-19 infection.  Renal function improvied on gentle IV fluid hydration. Now had issues with difficult IV access encouraging oral intake continue to hold torsemide.  Off IV fluids.  Repeat labs creatinine 1.6 if further trends up may need IV fluids. Recent Labs  Lab 08/01/20 0438 08/02/20 0113 08/03/20 0224 08/04/20 0145 08/05/20 0908  BUN 44* 39* 31* 25* 22  CREATININE 2.29* 1.94* 1.68* 1.56* 1.61*   Orthostatic hypotension 7/31blood pressure 79/46 standing and 112/44 lying: Given 500 ml NS bolus and continuous IV fluids. Currently hemodynamically stable  Dehydration/lactic acidosis resolved with IV fluids.  Increase p.o.   Borderline positive troponin suspect in the setting of CHF no delta delta.  No chest pain.   Generalized weakness/failure to thrive: In the setting of AKI dehydration.  Waiting for skilled nursing facility  ?  Facial droop in the ED however likely from weakness related, no evidence of stroke on MRI.  Resolved  Mild confusion/mild acute metabolic encephalopathy? Sundowning in the setting of dehydration and AKI: Daughter reports he has been calling them and at times confused evening and night.  Overall stable improved . He is placed on Seroquel bedtime low-dose 8/2. Continue ambulation PT OT  A. [id number] controlled, continue Eliquis.  Metoprolol remains on hold can likely resume at lower dose.? hr 20s heart rate per EMS here on telemetry overall stable,no episode of bradycardia of such degree   Hyperlipidemia: Continue statin   Diabetes mellitus without complication: Blood sugar stable,recent A1c was normal at  5.6 on 7/10   Essential hypertension: Is controlled.Holding metoprolol.    COVID-19 virus infection:  Largely asymptomatic no respiratory symptoms mostly with generalized weakness.  CRP normal D-dimer somewhat positive but on Eliquis already.  Status post remdesivir x3 days.  Recommend Isolation x10 days since disease onset/posotive date whichever is earlier.  Chronic diastolic CHF: Hypovolemic on admission, currently euvolemic.  He was discharged on torsemide on last admission, consider discharging on lower dose torsemide versus Lasix.   Chronic constipation:No acute finding on the CT, continue MiraLAX daily along with as needed Dulcolax and as needed Fleet enema for severe constipation.  He has had bowel movements.     Right upper extremity arm wound wound care has been consulted .continue wound care.    Diet Order             Diet 2 gram sodium Room service appropriate? Yes; Fluid consistency: Thin  Diet effective now                  Patient's Body mass index is 29.23 kg/m. DVT prophylaxis: apixaban (ELIQUIS) tablet 2.5 mg Start: 07/31/20 2200 SCDs Start: 07/31/20 1722 Code Status:   Code Status: DNR  Family Communication: plan of care discussed with patient at bedside.  Updated his daughter Jackelyn Poling over the phone 8/1. I called [name] and also updated she would like the patient to go to SNF.    Status is: Inpatient  Patient remains hospitalized due to unsafe disposition, lives alone, needs a skilled nursing facility.    Dispo: The patient is from: Home              Anticipated d/c is to: Skilled nursing facility once bed available              Patient currently currently medically stable for discharge   Difficult to place patient No  Unresulted Labs (From admission, onward)     Start     Ordered   08/06/20 [id number]  Basic metabolic panel  Tomorrow morning,   R       Question:  Specimen collection method  Answer:  Lab=Lab collect   08/05/20 0801   07/31/20 1230  Differential  Once,   STAT        07/31/20 1236           Medications reviewed:  Scheduled Meds:   albuterol  2 puff Inhalation Q6H   allopurinol  300 mg Oral Daily   apixaban  2.5 mg Oral BID   docusate sodium  100 mg Oral BID   polyethylene glycol  17 g Oral Daily   QUEtiapine  12.5 mg Oral QHS   simvastatin  20 mg Oral q1800   vitamin B-12  500 mcg Oral Daily   Continuous Infusions:  sodium chloride Stopped (08/02/20 2101)   Consultants:see note  Procedures:see note Antimicrobials: Anti-infectives (From admission, onward)    Start     Dose/Rate Route Frequency Ordered Stop   08/01/20 1000  remdesivir 100 mg in sodium chloride 0.9 % 100 mL IVPB       See Hyperspace for full Linked Orders Report.   100 mg 200 mL/hr over 30 Minutes Intravenous Daily 07/31/20 1812 08/02/20 1052   07/31/20 1912  remdesivir 200 mg in sodium chloride 0.9% 250 mL IVPB       See Hyperspace for full Linked Orders Report.   200 mg 580 mL/hr over 30 Minutes Intravenous Once 07/31/20 1812 07/31/20 2211      Culture/Microbiology  Component Value Date/Time   SDES URINE, CLEAN CATCH 07/31/2020 1232   SPECREQUEST NONE 07/31/2020 1232   CULT  07/31/2020 1232    NO GROWTH Performed at [facility], [place] [address], [place], [place] [address]    REPTSTATUS 08/01/2020 FINAL 07/31/2020 1232    Other culture-see note  Objective: Vitals: Today's Vitals   08/04/20 1412 08/04/20 1956 08/05/20 0356 08/05/20 0947  BP: 127/60 (!) 145/57 (!) 119/56 (!) 115/50  Pulse: 80 80 79   Resp: '18 17 17   '$ Temp: 98.1 F (36.7 C) (!) 97.4 F (36.3 C) 97.6 F (36.4 C)   TempSrc: Oral Oral Oral   SpO2: 98% 97% 95% 96%  Weight:      Height:      PainSc:  0-No pain      Intake/Output Summary (Last 24 hours) at 08/05/2020 1143 Last data filed at 08/05/2020 1100 Gross per 24 hour  Intake 357 ml  Output 475 ml  Net -118 ml   Filed Weights   07/31/20 1800 08/02/20 0415 08/03/20 0512  Weight: 87.5 kg 87.3 kg 92.4 kg   Weight change:   Intake/Output from previous day: 08/03 0701 - 08/04 0700 In: 237  [P.O.:237] Out: 125 [Urine:125] Intake/Output this shift: Total I/O In: 120 [P.O.:120] Out: 350 [Urine:350] Filed Weights   07/31/20 1800 08/02/20 0415 08/03/20 0512  Weight: 87.5 kg 87.3 kg 92.4 kg   Examination: General exam: AAOx 3, pleasant elderly not in acute distress.  On room air.   HEENT:Oral mucosa moist, Ear/Nose WNL grossly, dentition normal. Respiratory system: bilaterally diminished, no added sounds, no use of accessory muscle Cardiovascular system: S1 & S2 +, No JVD,. Gastrointestinal system: Abdomen soft, NT,ND, BS+ Nervous System:Alert, awake, moving extremities and grossly nonfocal Extremities: Chronic appearing leg edema significantly improved from previous admission Skin: No rashes,no icterus. MSK: Normal muscle bulk,tone, power    Data Reviewed: I have personally reviewed following labs and imaging studies CBC: Recent Labs  Lab 07/31/20 1045 07/31/20 1251 08/01/20 0029 08/03/20 0224  WBC 5.4 5.2 5.5 5.0  NEUTROABS  --  2.9 3.7 3.1  HGB 10.5* 10.8* 10.2* 9.8*  HCT 33.0* 34.0* 32.1* 30.8*  MCV 98.2 97.1 96.7 96.9  PLT 235 227 209 [id number]   Basic Metabolic Panel: Recent Labs  Lab 08/01/20 0438 08/02/20 0113 08/03/20 0224 08/04/20 0145 08/05/20 0908  NA 140 141 140 138 141  K 3.6 3.6 3.9 3.9 4.2  CL 97* 102 104 104 108  CO2 32 '30 28 27 25  '$ GLUCOSE 100* 91 89 97 91  BUN 44* 39* 31* 25* 22  CREATININE 2.29* 1.94* 1.68* 1.56* 1.61*  CALCIUM 9.7 9.0 9.0 8.7* 8.6*   GFR: Estimated Creatinine Clearance: 36.9 mL/min (A) (by C-G formula based on SCr of 1.61 mg/dL (H)). Liver Function Tests: Recent Labs  Lab 07/31/20 1251 08/01/20 0438  AST 22 28  ALT 11 10  ALKPHOS 79 77  BILITOT 1.7* 1.1  PROT 6.4* 6.2*  ALBUMIN 2.7* 2.6*   No results for input(s): LIPASE, AMYLASE in the last 168 hours. Recent Labs  Lab 07/31/20 1251  AMMONIA 33   Coagulation Profile: No results for input(s): INR, PROTIME in the last 168 hours. Cardiac Enzymes: No  results for input(s): CKTOTAL, CKMB, CKMBINDEX, TROPONINI in the last 168 hours. BNP (last 3 results) No results for input(s): PROBNP in the last 8760 hours. HbA1C: No results for input(s): HGBA1C in the last 72 hours. CBG: No results for input(s): GLUCAP  in the last 168 hours. Lipid Profile: No results for input(s): CHOL, HDL, LDLCALC, TRIG, CHOLHDL, LDLDIRECT in the last 72 hours. Thyroid Function Tests: No results for input(s): TSH, T4TOTAL, FREET4, T3FREE, THYROIDAB in the last 72 hours.  Anemia Panel: No results for input(s): VITAMINB12, FOLATE, FERRITIN, TIBC, IRON, RETICCTPCT in the last 72 hours. Sepsis Labs: Recent Labs  Lab 07/31/20 1251 07/31/20 1512  LATICACIDVEN 2.1* 1.8    Recent Results (from the past 240 hour(s))  Resp Panel by RT-PCR (Flu A&B, Covid) Nasopharyngeal Swab     Status: Abnormal   Collection Time: 07/31/20 11:08 AM   Specimen: Nasopharyngeal Swab; Nasopharyngeal(NP) swabs in vial transport medium  Result Value Ref Range Status   SARS Coronavirus 2 by RT PCR POSITIVE (A) NEGATIVE Final    Comment: RESULT CALLED TO, READ BACK BY AND VERIFIED WITH: RN S.WOODWORTH ON [id number] AT 1240 BY E.PARRISH (NOTE) SARS-CoV-2 target nucleic acids are DETECTED.  The SARS-CoV-2 RNA is generally detectable in upper respiratory specimens during the acute phase of infection. Positive results are indicative of the presence of the identified virus, but do not rule out bacterial infection or co-infection with other pathogens not detected by the test. Clinical correlation with patient history and other diagnostic information is necessary to determine patient infection status. The expected result is Negative.  Fact Sheet for Patients: EntrepreneurPulse.com.au  Fact Sheet for Healthcare Providers: IncredibleEmployment.be  This test is not yet approved or cleared by the [place] FDA and  has been authorized for detection and/or  diagnosis of SARS-CoV-2 by FDA under an Emergency Use Authorization (EUA).  This EUA will remain in effect (meaning th is test can be used) for the duration of  the COVID-19 declaration under Section 564(b)(1) of the Act, 21 U.S.C. section 360bbb-3(b)(1), unless the authorization is terminated or revoked sooner.     Influenza A by PCR NEGATIVE NEGATIVE Final   Influenza B by PCR NEGATIVE NEGATIVE Final    Comment: (NOTE) The Xpert Xpress SARS-CoV-2/FLU/RSV plus assay is intended as an aid in the diagnosis of influenza from Nasopharyngeal swab specimens and should not be used as a sole basis for treatment. Nasal washings and aspirates are unacceptable for Xpert Xpress SARS-CoV-2/FLU/RSV testing.  Fact Sheet for Patients: EntrepreneurPulse.com.au  Fact Sheet for Healthcare Providers: IncredibleEmployment.be  This test is not yet approved or cleared by the [place] FDA and has been authorized for detection and/or diagnosis of SARS-CoV-2 by FDA under an Emergency Use Authorization (EUA). This EUA will remain in effect (meaning this test can be used) for the duration of the COVID-19 declaration under Section 564(b)(1) of the Act, 21 U.S.C. section 360bbb-3(b)(1), unless the authorization is terminated or revoked.  Performed at [facility], [place] [address], [place], [place] [address]   Urine Culture     Status: None   Collection Time: 07/31/20 12:32 PM   Specimen: Urine, Clean Catch  Result Value Ref Range Status   Specimen Description URINE, CLEAN CATCH  Final   Special Requests NONE  Final   Culture   Final    NO GROWTH Performed at [facility], [place] [address] [place], [place] [address]    Report Status 08/01/2020 FINAL  Final      Radiology Studies: No results found.   LOS: 4 days   Antonieta Pert, MD [facility]  08/05/2020, 11:43 AM

## 2020-08-05 NOTE — Progress Notes (Signed)
Physical Therapy Treatment Patient Details Name: [name] MRN: [id number] DOB: 02-28-1931 Today's Date: 08/05/2020    History of Present Illness Pt adm 7/30 with weakness, lethargy, bradycardia and AKI. Pt tested positive for covid on 07/25/20. Pt with recent adm to [facility] 7/10-7/20 for acute on chronic diastolic hear failure. Also with rt thigh hematoma while there. PMH - chf, afib, ckd, DM, HTN, gout    PT Comments    Pt with improved activity tolerance this session, ambulating for increased distances and participating in ADLs at sink. Pt demonstrates improvement in balance and does not require physical assistance during this session. Pt expresses concern over recent history of falls at home and no caregiver support at home currently as his spouse is in rehab currently. Pt will benefit from SNF placement to further improve activity tolerance and restore independence in ADL/IADL tasks.   Follow Up Recommendations  SNF     Equipment Recommendations  None recommended by PT    Recommendations for Other Services       Precautions / Restrictions Precautions Precautions: Fall Precaution Comments: B LE edema Restrictions Weight Bearing Restrictions: No    Mobility  Bed Mobility Overal bed mobility: Modified Independent Bed Mobility: Supine to Sit;Sit to Supine     Supine to sit: Modified independent (Device/Increase time) Sit to supine: Modified independent (Device/Increase time)   General bed mobility comments: HOB elevated    Transfers Overall transfer level: Needs assistance Equipment used: Rolling walker (2 wheeled) Transfers: Sit to/from Stand Sit to Stand: Supervision         General transfer comment: cues for hand placement, railing utilized from lower commode  Ambulation/Gait Ambulation/Gait assistance: Min guard Gait Distance (Feet): 100 Feet (100' x 2, additional 30') Assistive device: Rolling walker (2 wheeled) Gait Pattern/deviations: Step-through  pattern Gait velocity: decr Gait velocity interpretation: <1.8 ft/sec, indicate of risk for recurrent falls General Gait Details: pt with slowed step-through gait, pt picking up walker during turns rather than turning on ground, requiring cues fdor safety   Stairs             Wheelchair Mobility    Modified Rankin (Stroke Patients Only)       Balance Overall balance assessment: Needs assistance Sitting-balance support: No upper extremity supported;Feet supported Sitting balance-Leahy Scale: Good     Standing balance support: No upper extremity supported;During functional activity Standing balance-Leahy Scale: Good                              Cognition Arousal/Alertness: Awake/alert Behavior During Therapy: WFL for tasks assessed/performed Overall Cognitive Status: Within Functional Limits for tasks assessed                                        Exercises      General Comments General comments (skin integrity, edema, etc.): VSS on RA      Pertinent Vitals/Pain Pain Assessment: No/denies pain    Home Living                      Prior Function            PT Goals (current goals can now be found in the care plan section) Acute Rehab PT Goals Patient Stated Goal: to go to snf Progress towards PT goals: Progressing toward goals  Frequency    Min 2X/week      PT Plan Current plan remains appropriate    Co-evaluation              AM-PAC PT "6 Clicks" Mobility   Outcome Measure  Help needed turning from your back to your side while in a flat bed without using bedrails?: None Help needed moving from lying on your back to sitting on the side of a flat bed without using bedrails?: None Help needed moving to and from a bed to a chair (including a wheelchair)?: A Little Help needed standing up from a chair using your arms (e.g., wheelchair or bedside chair)?: A Little Help needed to walk in hospital room?:  A Little Help needed climbing 3-5 steps with a railing? : A Little 6 Click Score: 20    End of Session   Activity Tolerance: Patient tolerated treatment well Patient left: in bed;with call bell/phone within reach Nurse Communication: Mobility status PT Visit Diagnosis: Muscle weakness (generalized) (M62.81);History of falling (Z91.81);Unsteadiness on feet (R26.81)     Time: 1555-1630 PT Time Calculation (min) (ACUTE ONLY): 35 min  Charges:  $Gait Training: 8-22 mins $Therapeutic Activity: 8-22 mins                     Zenaida Niece, PT, DPT Acute Rehabilitation Pager: [phone]    Zenaida Niece 08/05/2020, 5:31 PM

## 2020-08-05 NOTE — Consult Note (Signed)
   [facility]patient Consult   08/05/2020  OHN BOSTIC August 10, 1931 [id number]  [place] Organization [ACO] Patient: [facility] HMO  Assess for less than 30 days readmission and transition of care needs. Primary Care Provider:  Midge Minium, MD at [facility] and Embedded provider with Chronic Care Management team/program. Patient has been active with Embedded CCM Pharmacist noted.  Review reveals patient is to transition to a skilled nursing facility level of care for rehab and needs for transitional care are to be met at that level of care for post hospital transition.  Plan:  No [facility] Care Management needs at this time due to facility transition.  For questions or referrals, please contact:   Natividad Brood, RN BSN [facility] Liaison  [phone] business mobile phone Toll free office [phone]  Fax number: [phone] [email or web address]

## 2020-08-05 NOTE — TOC Progression Note (Signed)
Transition of Care [facility]) - Progression Note    Patient Details  Name: [name] MRN: [id number] Date of Birth: [birth year]  [facility]) CM/SW [place], [place] Phone Number: 08/05/2020, 4:03 PM  Clinical Narrative:     SNF Helene Kelp informed [facility] requested Peer to Peer by 4pm tomorrow . MD is requested to call [phone] ext [phone].   Thurmond Butts, MSW, LCSW Clinical Social Worker     Expected Discharge Plan: Skilled Nursing Facility Barriers to Discharge: Continued Medical Work up  Expected Discharge Plan and Services Expected Discharge Plan: [place] In-house Referral: Clinical Social Work     Living arrangements for the past 2 months: Single Family Home (from home with spouse)                                       Social Determinants of Health (SDOH) Interventions    Readmission Risk Interventions Readmission Risk Prevention Plan 07/12/2020  Transportation Screening Complete  PCP or Specialist Appt within 5-7 Days Complete  Home Care Screening Complete  Medication Review (RN CM) Complete  Some recent data might be hidden

## 2020-08-05 NOTE — Care Management Important Message (Signed)
Important Message  Patient Details  Name: [name] MRN: [id number] Date of Birth: [birth year]   Medicare Important Message Given:  Yes  Pt. On precautions left IM letter with Nurse.   Holli Humbles Stull 08/05/2020, 10:57 AM

## 2020-08-06 DIAGNOSIS — N179 Acute kidney failure, unspecified: Secondary | ICD-10-CM | POA: Diagnosis not present

## 2020-08-06 LAB — BASIC METABOLIC PANEL
Anion gap: 6 (ref 5–15)
BUN: 20 mg/dL (ref 8–23)
CO2: 25 mmol/L (ref 22–32)
Calcium: 8.7 mg/dL — ABNORMAL LOW (ref 8.9–10.3)
Chloride: 107 mmol/L (ref 98–111)
Creatinine, Ser: 1.43 mg/dL — ABNORMAL HIGH (ref 0.61–1.24)
GFR, Estimated: 47 mL/min — ABNORMAL LOW (ref >60)
Glucose, Bld: 94 mg/dL (ref 70–99)
Potassium: 4.1 mmol/L (ref 3.5–5.1)
Sodium: 138 mmol/L (ref 135–145)

## 2020-08-06 MED ORDER — HYDROCOD POLST-CPM POLST ER 10-8 MG/5ML PO SUER
5.0000 mL | Freq: Two times a day (BID) | ORAL | Status: DC
  Administered 2020-08-06 – 2020-08-10 (×10): 5 mL via ORAL
  Filled 2020-08-06 (×10): qty 5

## 2020-08-06 NOTE — Progress Notes (Signed)
Occupational Therapy Treatment Patient Details Name: [name] MRN: [id number] DOB: 01/27/1931 Today's Date: 08/06/2020    History of present illness Pt adm 7/30 with weakness, lethargy, bradycardia and AKI. Pt tested positive for covid on 07/25/20. Pt with recent adm to [facility] 7/10-7/20 for acute on chronic diastolic hear failure. Also with rt thigh hematoma while there. PMH - chf, afib, ckd, DM, HTN, gout   OT comments  Pt was seen for ADL session. Pt. Was able to preform  ADLs sitting EOB with  good balance. Pt. Stood to clean peri area and condom catheter came loose and pt. Urinated all over himself. LE ADLs were needed to be done again and nursing came to reapply condom catheter.   Follow Up Recommendations  SNF    Equipment Recommendations  None recommended by OT    Recommendations for Other Services      Precautions / Restrictions Precautions Precautions: Fall Precaution Comments: B LE edema Restrictions Weight Bearing Restrictions: No       Mobility Bed Mobility Overal bed mobility: Modified Independent Bed Mobility: Supine to Sit;Sit to Supine     Supine to sit: Modified independent (Device/Increase time) Sit to supine: Modified independent (Device/Increase time)   General bed mobility comments: HOB elevated    Transfers Overall transfer level: Needs assistance     Sit to Stand: Min guard         General transfer comment: cues for proper hand placement    Balance Overall balance assessment: Needs assistance   Sitting balance-Leahy Scale: Good       Standing balance-Leahy Scale: Fair                             ADL either performed or assessed with clinical judgement   ADL Overall ADL's : Needs assistance/impaired Eating/Feeding: Modified independent   Grooming: Applying deodorant;Wash/dry hands;Wash/dry face;Oral care;Sitting;Set up   Upper Body Bathing: Set up;Sitting   Lower Body Bathing: Min guard;Sit to/from stand    Upper Body Dressing : Set up;Sitting   Lower Body Dressing: Min guard;Sit to/from stand   Toilet Transfer: Comfort height toilet;RW;Ambulation;Minimal assistance   Toileting- Water quality scientist and Hygiene: Min guard;Sit to/from stand       Functional mobility during ADLs: Rolling walker;Minimal assistance General ADL Comments: Pt. using cross leg technique.     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        Exercises     Shoulder Instructions       General Comments      Pertinent Vitals/ Pain       Pain Assessment: No/denies pain  Home Living                                          Prior Functioning/Environment              Frequency  Min 2X/week  Progress Toward Goals  OT Goals(current goals can now be found in the care plan section)  Progress towards OT goals: Progressing toward goals  Acute Rehab OT Goals Patient Stated Goal: to get better OT Goal Formulation: With patient Time For Goal Achievement: 08/17/20 Potential to Achieve Goals: Good ADL Goals Pt Will Perform Lower Body Bathing: with modified independence Pt Will Perform Lower Body Dressing: with modified independence Pt Will Transfer to Toilet: with modified independence Pt Will Perform Toileting - Clothing Manipulation and hygiene: with modified independence Pt Will Perform Tub/Shower Transfer: with modified independence Additional ADL Goal #1: patient to complete ADL routine standing at sink in room to increase independence in ADLs  Plan Discharge plan remains appropriate    Co-evaluation                 AM-PAC OT "6 Clicks"  Daily Activity     Outcome Measure   Help from another person eating meals?: None Help from another person taking care of personal grooming?: A Little Help from another person toileting, which includes using toliet, bedpan, or urinal?: A Little Help from another person bathing (including washing, rinsing, drying)?: A Little Help from another person to put on and taking off regular upper body clothing?: A Little Help from another person to put on and taking off regular lower body clothing?: A Little 6 Click Score: 19    End of Session Equipment Utilized During Treatment: Rolling walker;Gait belt  OT Visit Diagnosis: Unsteadiness on feet (R26.81);Muscle weakness (generalized) (M62.81)   Activity Tolerance Patient tolerated treatment well   Patient Left in bed;with nursing/sitter in room;with call bell/phone within reach   Nurse Communication  (condom catherter came off.)        Time: [id number] OT Time Calculation (min): 54 min  Charges: OT General Charges $OT Visit: 1 Visit OT Treatments $Self Care/Home Management : 53-67 mins  [name] OT/L    Elliot Simoneaux 08/06/2020, 12:01 PM

## 2020-08-06 NOTE — TOC Progression Note (Signed)
Transition of Care [facility]) - Progression Note    Patient Details  Name: [name] MRN: [id number] Date of Birth: [birth year]  Transition of Care [facility]) CM/SW [place], [place] Phone Number: 08/06/2020, 11:56 AM  Clinical Narrative:     CSW updated MD - peer to peer was requested - he states he will complete- CSW awaiting insurance decision.  Thurmond Butts, MSW, LCSW Clinical Social Worker    Expected Discharge Plan: Skilled Nursing Facility Barriers to Discharge: Continued Medical Work up  Expected Discharge Plan and Services Expected Discharge Plan: [place] In-house Referral: Clinical Social Work     Living arrangements for the past 2 months: Single Family Home (from home with spouse)                                       Social Determinants of Health (SDOH) Interventions    Readmission Risk Interventions Readmission Risk Prevention Plan 07/12/2020  Transportation Screening Complete  PCP or Specialist Appt within 5-7 Days Complete  Home Care Screening Complete  Medication Review (RN CM) Complete  Some recent data might be hidden

## 2020-08-06 NOTE — Progress Notes (Signed)
PROGRESS NOTE    [name]  [id number] DOB: [birth year] DOA: 07/31/2020 PCP: Midge Minium, MD   Chief Complaint  Patient presents with   Bradycardia   Brief Narrative: 87 y.o. male with medical history significant for chronic diastolic CHF, CKD stage IIIb baselien creat 1.4-1.5, hypertension, HLD, obesity,type 2 diabetes mellitus, chronic venous insufficiency, A. Fib, recent admission 7/10-/720 for acute on chronic diastolic CHF, right thigh hematoma while on Coumadin> wt on d/c 214 lb, 233 lb on admission, syndrome on Eliquis, torsemide brought to the hospital due to progressive generalized weakness lethargy. Upon his discharge patient started to have weakness, difficulty getting up, sometimes confused, constipation He tested positive for COVID on 07/25/20.  No report of shortness of breath hypoxia fever focal weakness.  Apparently  EMS reported bradycardic to the 20s on the cardiac monitor but unable to capture/no records available.   ED Course: Hemodynamically stable, saturating well on room air, tested positive for COVID-19 negative, BNP 521 troponin 23 mild lactic acidosis that resolved to 1.2, UA fairly normal, TSH 1.4.  Chest x-ray showed decreased left pleural fluid with persistent atelectasis/pneumonia in the left mid and lower lung zones.  Patient was complaining of chronic constipation and CT abdomen pelvis was done that showed-renal acute finding, left effusion with underlying opacity identified right effusion resolved.  There is question of facial droop CT head was unremarkable MRI head pending.  Labs showed AKI worse from baseline admission has been requested for further management. Patient is admitted torsemide held management-hydration creatinine has improved close to baseline.  Off IV fluids.  Had issues with difficult IV access encouraging oral intake  Subjective: The patient was seen and examined this morning, awake alert, following command, still complaining of  generalized weakness... Denies any pain, reporting improved shortness of breath Shortness of breath worsens with minimal exertion, currently satting 97% at rest on room air Continue to complain of persistent cough  Assessment & Plan:  AKI on CKD on stage IIIb  Stable,-improving creatinine Baseline creatinine 1.4-1.5.  on admission creatinine 2.5.  AKI multifactorial due to overdiuresis from recent CHF admission, decreased p.o. intake, COVID-19 infection.  Renal function improvied on gentle IV fluid hydration. Now had issues with difficult IV access encouraging oral intake continue to hold torsemide.  Off IV fluids.  Repeat labs creatinine 1.6 if further trends up may need IV fluids. Recent Labs  Lab 08/02/20 0113 08/03/20 0224 08/04/20 0145 08/05/20 0908 08/06/20 0250  BUN 39* 31* 25* 22 20  CREATININE 1.94* 1.68* 1.56* 1.61* 1.43*   Orthostatic hypotension 7/31blood pressure 79/46 standing and 112/44 lying: Given 500 ml NS bolus and continuous IV fluids. Currently hemodynamically stable Will discontinue IV fluids  Dehydration/lactic acidosis resolved with IV fluids.  Increase p.o.   Borderline positive troponin -stable denies any chest pain. suspect in the setting of CHF no delta delta.     Generalized weakness/failure to thrive: In the setting of AKI dehydration.  Waiting for skilled nursing facility  ?  Facial droop in the ED  -Negative any focal neurological findings aside from global generalized weaknesses,  -  no evidence of stroke on MRI.  Resolved  Mild confusion/mild acute metabolic encephalopathy? Currently stable awake alert oriented  Sundowning in the setting of dehydration and AKI: Daughter reports he has been calling them and at times confused evening and night.  Overall stable improved . He is placed on Seroquel bedtime low-dose 8/2. Continue ambulation PT OT--recommending SNF  A. [id number] controlled, continue Eliquis.  Metoprolol remains on hold can likely resume  at lower dose.? hr 20s heart rate per EMS here on telemetry overall stable,no episode of bradycardia of such degree   Hyperlipidemia: Stable, continue statin   Diabetes mellitus without complication: Blood sugar stable,recent A1c was normal at 5.6 on 7/10   Essential hypertension: Is controlled.Holding metoprolol.    COVID-19 virus infection:  Largely asymptomatic no respiratory symptoms mostly with generalized weakness.  CRP normal D-dimer somewhat positive but on Eliquis already.  Status post remdesivir x3 days.   Recommend Isolation x10 days since disease onset/posotive date whichever is earlier. (4 more days of isolation likely will be cleared on 08/10/2020)  Chronic diastolic CHF:  Remained stable,, monitoring daily weight, IV fluid discontinued Hypovolemic on admission, currently euvolemic.  He was discharged on torsemide on last admission, consider discharging on lower dose torsemide versus Lasix.   Chronic constipation:No acute finding on the CT, continue MiraLAX daily along with as needed Dulcolax and as needed Fleet enema for severe constipation.  He has had bowel movements.     Right upper extremity arm wound wound care has been consulted .continue wound care.    Diet Order             Diet 2 gram sodium Room service appropriate? Yes; Fluid consistency: Thin  Diet effective now                  Patient's Body mass index is 29.23 kg/m. DVT prophylaxis: apixaban (ELIQUIS) tablet 2.5 mg Start: 07/31/20 2200 SCDs Start: 07/31/20 1722 Code Status:   Code Status: DNR  Family Communication:  plan of care discussed with patient at bedside.  Updated his daughter Jackelyn Poling over the phone 8/1. I called [name] and also updated she would like the patient to go to SNF.    Status is: Inpatient  Patient remains hospitalized due to unsafe disposition, lives alone, needs a skilled nursing facility.    Dispo: The patient is from: Home              Anticipated d/c is to: Skilled  nursing facility once bed available  Basal request peer to peer review the patient's insurance, Mcarthur Rossetti was called at  [phone] extension [phone] information was provided pending callback from Dr. Kathyrn Sheriff                Patient currently currently medically stable for discharge   Difficult to place patient No  Unresulted Labs (From admission, onward)     Start     Ordered   07/31/20 1230  Differential  Once,   STAT        07/31/20 1236           Medications reviewed:  Scheduled Meds:  albuterol  2 puff Inhalation Q6H   allopurinol  300 mg Oral Daily   apixaban  2.5 mg Oral BID   docusate sodium  100 mg Oral BID   polyethylene glycol  17 g Oral Daily   QUEtiapine  12.5 mg Oral QHS   simvastatin  20 mg Oral q1800   vitamin B-12  500 mcg Oral Daily   Continuous Infusions:  sodium chloride Stopped (08/02/20 2101)   Consultants:see note  Procedures:see note Antimicrobials: Anti-infectives (From admission, onward)    Start     Dose/Rate Route Frequency Ordered Stop   08/01/20 1000  remdesivir 100 mg in sodium chloride 0.9 % 100 mL IVPB       See Hyperspace for full Linked  Orders Report.   100 mg 200 mL/hr over 30 Minutes Intravenous Daily 07/31/20 1812 08/02/20 1052   07/31/20 1912  remdesivir 200 mg in sodium chloride 0.9% 250 mL IVPB       See Hyperspace for full Linked Orders Report.   200 mg 580 mL/hr over 30 Minutes Intravenous Once 07/31/20 1812 07/31/20 2211      Culture/Microbiology    Component Value Date/Time   SDES URINE, CLEAN CATCH 07/31/2020 1232   SPECREQUEST NONE 07/31/2020 1232   CULT  07/31/2020 1232    NO GROWTH Performed at [facility], [place] [address], [place], [place] [address]    REPTSTATUS 08/01/2020 FINAL 07/31/2020 1232    Other culture-see note  Objective: Vitals: Today's Vitals   08/05/20 0947 08/05/20 1430 08/05/20 2200 08/06/20 0417  BP: (!) 115/50 (!) 116/50  (!) 133/59  Pulse:  86  85  Resp:  18  17   Temp:  97.9 F (36.6 C)  (!) 97.5 F (36.4 C)  TempSrc:  Oral  Oral  SpO2: 96% 100%  97%  Weight:      Height:      PainSc:   0-No pain     Intake/Output Summary (Last 24 hours) at 08/06/2020 1154 Last data filed at 08/05/2020 1500 Gross per 24 hour  Intake --  Output 500 ml  Net -500 ml   Filed Weights   07/31/20 1800 08/02/20 0415 08/03/20 0512  Weight: 87.5 kg 87.3 kg 92.4 kg   Weight change:   Intake/Output from previous day: 08/04 0701 - 08/05 0700 In: 120 [P.O.:120] Out: 850 [Urine:850] Intake/Output this shift: No intake/output data recorded. Filed Weights   07/31/20 1800 08/02/20 0415 08/03/20 0512  Weight: 87.5 kg 87.3 kg 92.4 kg      Physical Exam:   General:  Alert, oriented, cooperative, no distress;   HEENT:  Normocephalic, PERRL, otherwise with in Normal limits   Neuro:  CNII-XII intact. , normal motor and sensation, reflexes intact   Lungs:   Clear to auscultation BL, Respirations unlabored, no wheezes / crackles  Cardio:    S1/S2, RRR, No murmure, No Rubs or Gallops   Abdomen:   Soft, non-tender, bowel sounds active all four quadrants,  no guarding or peritoneal signs.  Muscular skeletal:  Severe generalized global weaknesses, unsteady gait, Limited exam - in bed, able to move all 4 extremities, bilateral reduced strength lower extremities, 2+ pulses,  symmetric, No pitting edema  Skin:  Dry, warm to touch, negative for any Rashes,  Wounds: Please see nursing documentation           Data Reviewed: I have personally reviewed following labs and imaging studies CBC: Recent Labs  Lab 07/31/20 1045 07/31/20 1251 08/01/20 0029 08/03/20 0224  WBC 5.4 5.2 5.5 5.0  NEUTROABS  --  2.9 3.7 3.1  HGB 10.5* 10.8* 10.2* 9.8*  HCT 33.0* 34.0* 32.1* 30.8*  MCV 98.2 97.1 96.7 96.9  PLT 235 227 209 [id number]   Basic Metabolic Panel: Recent Labs  Lab 08/02/20 0113 08/03/20 0224 08/04/20 0145 08/05/20 0908 08/06/20 0250  NA 141 140 138 141 138  K 3.6  3.9 3.9 4.2 4.1  CL 102 104 104 108 107  CO2 '30 28 27 25 25  '$ GLUCOSE 91 89 97 91 94  BUN 39* 31* 25* 22 20  CREATININE 1.94* 1.68* 1.56* 1.61* 1.43*  CALCIUM 9.0 9.0 8.7* 8.6* 8.7*   GFR: Estimated Creatinine Clearance: 41.6 mL/min (A) (by C-G formula  based on SCr of 1.43 mg/dL (H)). Liver Function Tests: Recent Labs  Lab 07/31/20 1251 08/01/20 0438  AST 22 28  ALT 11 10  ALKPHOS 79 77  BILITOT 1.7* 1.1  PROT 6.4* 6.2*  ALBUMIN 2.7* 2.6*   No results for input(s): LIPASE, AMYLASE in the last 168 hours. Recent Labs  Lab 07/31/20 1251  AMMONIA 33   Sepsis Labs: Recent Labs  Lab 07/31/20 1251 07/31/20 1512  LATICACIDVEN 2.1* 1.8    Recent Results (from the past 240 hour(s))  Resp Panel by RT-PCR (Flu A&B, Covid) Nasopharyngeal Swab     Status: Abnormal   Collection Time: 07/31/20 11:08 AM   Specimen: Nasopharyngeal Swab; Nasopharyngeal(NP) swabs in vial transport medium  Result Value Ref Range Status   SARS Coronavirus 2 by RT PCR POSITIVE (A) NEGATIVE Final    Comment: RESULT CALLED TO, READ BACK BY AND VERIFIED WITH: RN S.WOODWORTH ON [id number] AT 1240 BY E.PARRISH (NOTE) SARS-CoV-2 target nucleic acids are DETECTED.  The SARS-CoV-2 RNA is generally detectable in upper respiratory specimens during the acute phase of infection. Positive results are indicative of the presence of the identified virus, but do not rule out bacterial infection or co-infection with other pathogens not detected by the test. Clinical correlation with patient history and other diagnostic information is necessary to determine patient infection status. The expected result is Negative.  Fact Sheet for Patients: EntrepreneurPulse.com.au  Fact Sheet for Healthcare Providers: IncredibleEmployment.be  This test is not yet approved or cleared by the [place] FDA and  has been authorized for detection and/or diagnosis of SARS-CoV-2 by FDA under an  Emergency Use Authorization (EUA).  This EUA will remain in effect (meaning th is test can be used) for the duration of  the COVID-19 declaration under Section 564(b)(1) of the Act, 21 U.S.C. section 360bbb-3(b)(1), unless the authorization is terminated or revoked sooner.     Influenza A by PCR NEGATIVE NEGATIVE Final   Influenza B by PCR NEGATIVE NEGATIVE Final    Comment: (NOTE) The Xpert Xpress SARS-CoV-2/FLU/RSV plus assay is intended as an aid in the diagnosis of influenza from Nasopharyngeal swab specimens and should not be used as a sole basis for treatment. Nasal washings and aspirates are unacceptable for Xpert Xpress SARS-CoV-2/FLU/RSV testing.  Fact Sheet for Patients: EntrepreneurPulse.com.au  Fact Sheet for Healthcare Providers: IncredibleEmployment.be  This test is not yet approved or cleared by the [place] FDA and has been authorized for detection and/or diagnosis of SARS-CoV-2 by FDA under an Emergency Use Authorization (EUA). This EUA will remain in effect (meaning this test can be used) for the duration of the COVID-19 declaration under Section 564(b)(1) of the Act, 21 U.S.C. section 360bbb-3(b)(1), unless the authorization is terminated or revoked.  Performed at [facility], [place] [address], [place], [place] [address]   Urine Culture     Status: None   Collection Time: 07/31/20 12:32 PM   Specimen: Urine, Clean Catch  Result Value Ref Range Status   Specimen Description URINE, CLEAN CATCH  Final   Special Requests NONE  Final   Culture   Final    NO GROWTH Performed at [facility], [place] [address], [place], [place] [address]    Report Status 08/01/2020 FINAL  Final      Radiology Studies: No results found.   LOS: 5 days   Deatra James, MD [facility]  08/06/2020, 11:54 AM

## 2020-08-07 DIAGNOSIS — N179 Acute kidney failure, unspecified: Secondary | ICD-10-CM | POA: Diagnosis not present

## 2020-08-07 NOTE — Progress Notes (Addendum)
PROGRESS NOTE    [name]  [id number] DOB: [birth year] DOA: 07/31/2020 PCP: Midge Minium, MD    Brief Narrative:  This 87 y.o. male with medical history significant for chronic diastolic CHF, CKD stage IIIb with baseline creat 1.4-1.5, hypertension, HLD, obesity,type 2 diabetes mellitus, chronic venous insufficiency, A. Fib, recent admission from 7/10- 7/20 for acute on chronic diastolic CHF, right thigh hematoma while on Coumadin > wt on d/c 214 lb, 233 lb on admission, on torsemide brought to the hospital due to progressive generalized weakness,  lethargy. Upon his discharge patient started to have weakness, difficulty getting up, sometimes confused, constipation.  He tested positive for COVID on 07/25/20.  No report of shortness of breath,  hypoxia, fever, focal weakness.  Apparently  EMS reported bradycardic to the 20s on the cardiac monitor but unable to capture/no records available.   Assessment & Plan:   Principal Problem:   AKI (acute kidney injury) ([place]) Active Problems:   Hyperlipidemia   Diabetes mellitus without complication ([place])   Essential hypertension   Chronic constipation   COVID-19 virus infection   Chronic diastolic CHF (congestive heart failure) ([facility])  AKI on CKD on stage IIIb  Baseline creatinine 1.4-1.5.  on admission creatinine 2.5.   AKI multifactorial due to overdiuresis from recent CHF admission, decreased p.o. intake, COVID-19 infection.   Renal functions improvied on gentle IV fluid hydration.  Encourage oral intake, continue to hold torsemide.  Off IV fluids.    Orthostatic hypotension : blood pressure 79/46 standing and 112/44 lying: 05/04/20 Given 500 ml NS bolus and continuous IV fluids. Currently hemodynamically stable.  Dehydration/lactic acidosis: resolved with IV fluids.  Encourage increase oral intake   Borderline positive troponin : Stable,  denies any chest pain.      Generalized weakness/failure to thrive:  In the setting of  AKI/ dehydration.  Awaiting SNF placement   Facial droop in the ED Negative for any focal neurological findings aside from global generalized weaknesses,  No evidence of stroke on MRI.  Resolved   Mild confusion/mild acute metabolic encephalopathy Currently stable,  awake alert oriented X2 Sundowning in the setting of dehydration and AKI:  Daughter reports he has been calling them and at times confused evening and night.   Overall stable improved . He is placed on Seroquel bedtime low-dose 8/2.  Continue ambulation PT OT--recommending SNF   Atrial. Fib:  Heart rate controlled, continue Eliquis.   Metoprolol remains on hold can likely resume at lower dose. No episodes of bradycardia of such degree.  Per EMS heart rate was 20 no documentation.   Hyperlipidemia: Continues statin   Diabetes mellitus without complication: Blood sugar stable, recent A1c was normal at 5.6 on 7/10   Essential hypertension: Hold metoprolol.  Blood pressure is controlled off medication   COVID-19 virus infection:  Largely asymptomatic, no respiratory symptoms mostly with generalized weakness.   CRP normal D-dimer somewhat positive but on Eliquis already.  Status post remdesivir x3 days.   Recommend Isolation x10 days since disease onset/posotive date whichever is earlier. (4 more days of isolation likely will be cleared on 08/10/2020)   Chronic diastolic CHF:  Remained stable,, monitoring daily weight, IV fluid discontinued Hypovolemic on admission, currently euvolemic.   He was discharged on torsemide on last admission, consider discharging on lower dose torsemide versus Lasix.   Chronic constipation: No acute finding on the CT, continue MiraLAX daily along with as needed Dulcolax and as needed Fleet enema for severe constipation.  He has had bowel movements.     Right upper extremity arm wound : wound care has been consulted .continue wound care.      DVT prophylaxis: Eliquis Code Status: DNR Family  Communication: (No family at bed side. Disposition Plan:  Status is: Inpatient  Remains inpatient appropriate because:Inpatient level of care appropriate due to severity of illness  Dispo: The patient is from: Home              Anticipated d/c is to: SNF              Patient currently is not medically stable to d/c.   Difficult to place patient No  Basal request peer to peer review the patient's insurance, [employer] Rossetti was called at [phone] extension [phone] information was provided pending callback from Dr. Kathyrn Sheriff    Consultants:   None  Procedures:  Antimicrobials:   Anti-infectives (From admission, onward)    Start     Dose/Rate Route Frequency Ordered Stop   08/01/20 1000  remdesivir 100 mg in sodium chloride 0.9 % 100 mL IVPB       See Hyperspace for full Linked Orders Report.   100 mg 200 mL/hr over 30 Minutes Intravenous Daily 07/31/20 1812 08/02/20 1052   07/31/20 1912  remdesivir 200 mg in sodium chloride 0.9% 250 mL IVPB       See Hyperspace for full Linked Orders Report.   200 mg 580 mL/hr over 30 Minutes Intravenous Once 07/31/20 1812 07/31/20 2211        Subjective: Patient was seen and examined at bedside.  He was lying comfortably in the bed,  denies any chest pain or shortness of breath.   He asked when he can be discharged.  Overnight events noted.  Objective: Vitals:   08/06/20 2140 08/07/20 0531 08/07/20 1023 08/07/20 1459  BP: (!) 127/56 (!) 120/51  (!) 107/51  Pulse: 92 78 79 80  Resp: '17 18 18 18  '$ Temp: 97.8 F (36.6 C) 97.7 F (36.5 C) (!) 97.1 F (36.2 C) (!) 97.4 F (36.3 C)  TempSrc: Oral Oral  Oral  SpO2: 98% 97% 97% 97%  Weight:      Height:        Intake/Output Summary (Last 24 hours) at 08/07/2020 1603 Last data filed at 08/06/2020 2154 Gross per 24 hour  Intake 150 ml  Output 200 ml  Net -50 ml   Filed Weights   07/31/20 1800 08/02/20 0415 08/03/20 0512  Weight: 87.5 kg 87.3 kg 92.4 kg    Examination:  General  exam: Appears calm and comfortable, not in any acute distress Respiratory system: Clear to auscultation. Respiratory effort normal. Cardiovascular system: S1 & S2 heard, RRR. No JVD, murmurs, rubs, gallops or clicks. No pedal edema. Gastrointestinal system: Abdomen is nondistended, soft and nontender. No organomegaly or masses felt. Normal bowel sounds heard. Central nervous system: Alert and oriented. No focal neurological deficits. Extremities: Symmetric 5 x 5 power.  No edema no cyanosis no clubbing. Skin: No rashes, lesions or ulcers Psychiatry: Judgement and insight appear normal. Mood & affect appropriate.     Data Reviewed: I have personally reviewed following labs and imaging studies  CBC: Recent Labs  Lab 08/01/20 0029 08/03/20 0224  WBC 5.5 5.0  NEUTROABS 3.7 3.1  HGB 10.2* 9.8*  HCT 32.1* 30.8*  MCV 96.7 96.9  PLT 209 [id number]   Basic Metabolic Panel: Recent Labs  Lab 08/02/20 0113 08/03/20 0224 08/04/20 0145 08/05/20 [id number]  08/06/20 0250  NA 141 140 138 141 138  K 3.6 3.9 3.9 4.2 4.1  CL 102 104 104 108 107  CO2 '30 28 27 25 25  '$ GLUCOSE 91 89 97 91 94  BUN 39* 31* 25* 22 20  CREATININE 1.94* 1.68* 1.56* 1.61* 1.43*  CALCIUM 9.0 9.0 8.7* 8.6* 8.7*   GFR: Estimated Creatinine Clearance: 41.6 mL/min (A) (by C-G formula based on SCr of 1.43 mg/dL (H)). Liver Function Tests: Recent Labs  Lab 08/01/20 0438  AST 28  ALT 10  ALKPHOS 77  BILITOT 1.1  PROT 6.2*  ALBUMIN 2.6*   No results for input(s): LIPASE, AMYLASE in the last 168 hours. No results for input(s): AMMONIA in the last 168 hours. Coagulation Profile: No results for input(s): INR, PROTIME in the last 168 hours. Cardiac Enzymes: No results for input(s): CKTOTAL, CKMB, CKMBINDEX, TROPONINI in the last 168 hours. BNP (last 3 results) No results for input(s): PROBNP in the last 8760 hours. HbA1C: No results for input(s): HGBA1C in the last 72 hours. CBG: No results for input(s): GLUCAP in the last  168 hours. Lipid Profile: No results for input(s): CHOL, HDL, LDLCALC, TRIG, CHOLHDL, LDLDIRECT in the last 72 hours. Thyroid Function Tests: No results for input(s): TSH, T4TOTAL, FREET4, T3FREE, THYROIDAB in the last 72 hours. Anemia Panel: No results for input(s): VITAMINB12, FOLATE, FERRITIN, TIBC, IRON, RETICCTPCT in the last 72 hours. Sepsis Labs: No results for input(s): PROCALCITON, LATICACIDVEN in the last 168 hours.  Recent Results (from the past 240 hour(s))  Resp Panel by RT-PCR (Flu A&B, Covid) Nasopharyngeal Swab     Status: Abnormal   Collection Time: 07/31/20 11:08 AM   Specimen: Nasopharyngeal Swab; Nasopharyngeal(NP) swabs in vial transport medium  Result Value Ref Range Status   SARS Coronavirus 2 by RT PCR POSITIVE (A) NEGATIVE Final    Comment: RESULT CALLED TO, READ BACK BY AND VERIFIED WITH: RN S.WOODWORTH ON [id number] AT 1240 BY E.PARRISH (NOTE) SARS-CoV-2 target nucleic acids are DETECTED.  The SARS-CoV-2 RNA is generally detectable in upper respiratory specimens during the acute phase of infection. Positive results are indicative of the presence of the identified virus, but do not rule out bacterial infection or co-infection with other pathogens not detected by the test. Clinical correlation with patient history and other diagnostic information is necessary to determine patient infection status. The expected result is Negative.  Fact Sheet for Patients: EntrepreneurPulse.com.au  Fact Sheet for Healthcare Providers: IncredibleEmployment.be  This test is not yet approved or cleared by the [place] FDA and  has been authorized for detection and/or diagnosis of SARS-CoV-2 by FDA under an Emergency Use Authorization (EUA).  This EUA will remain in effect (meaning th is test can be used) for the duration of  the COVID-19 declaration under Section 564(b)(1) of the Act, 21 U.S.C. section 360bbb-3(b)(1), unless the  authorization is terminated or revoked sooner.     Influenza A by PCR NEGATIVE NEGATIVE Final   Influenza B by PCR NEGATIVE NEGATIVE Final    Comment: (NOTE) The Xpert Xpress SARS-CoV-2/FLU/RSV plus assay is intended as an aid in the diagnosis of influenza from Nasopharyngeal swab specimens and should not be used as a sole basis for treatment. Nasal washings and aspirates are unacceptable for Xpert Xpress SARS-CoV-2/FLU/RSV testing.  Fact Sheet for Patients: EntrepreneurPulse.com.au  Fact Sheet for Healthcare Providers: IncredibleEmployment.be  This test is not yet approved or cleared by the [place] FDA and has been authorized for detection and/or diagnosis of  SARS-CoV-2 by FDA under an Emergency Use Authorization (EUA). This EUA will remain in effect (meaning this test can be used) for the duration of the COVID-19 declaration under Section 564(b)(1) of the Act, 21 U.S.C. section 360bbb-3(b)(1), unless the authorization is terminated or revoked.  Performed at [facility], [place] [address] [place], [place] [address]   Urine Culture     Status: None   Collection Time: 07/31/20 12:32 PM   Specimen: Urine, Clean Catch  Result Value Ref Range Status   Specimen Description URINE, CLEAN CATCH  Final   Special Requests NONE  Final   Culture   Final    NO GROWTH Performed at [facility], [place] [address] [place], [place] [address]    Report Status 08/01/2020 FINAL  Final     Radiology Studies: No results found.  Scheduled Meds:  albuterol  2 puff Inhalation Q6H   allopurinol  300 mg Oral Daily   apixaban  2.5 mg Oral BID   chlorpheniramine-HYDROcodone  5 mL Oral Q12H   docusate sodium  100 mg Oral BID   polyethylene glycol  17 g Oral Daily   QUEtiapine  12.5 mg Oral QHS   simvastatin  20 mg Oral q1800   vitamin B-12  500 mcg Oral Daily   Continuous Infusions:  sodium chloride Stopped (08/02/20 2101)     LOS: 6  days    Time spent: 35 mins    Garrie Elenes, MD [facility]   If 7PM-7AM, please contact night-coverage

## 2020-08-07 NOTE — TOC Progression Note (Signed)
Transition of Care [facility]) - Progression Note    Patient Details  Name: [name] MRN: [id number] Date of Birth: 1931-03-17  Transition of Care [facility]) CM/SW [place], [place] Phone Number: [phone] 08/07/2020, 11:35 AM  Clinical Narrative:     CSW attempted to reach out to [facility] at [place] to ascertain the status of the authorization. CSW sent text due to VM being full.  TOC team will continue to assist with discharge planning needs.    Expected Discharge Plan: Skilled Nursing Facility Barriers to Discharge: Continued Medical Work up  Expected Discharge Plan and Services Expected Discharge Plan: [place] In-house Referral: Clinical Social Work     Living arrangements for the past 2 months: Single Family Home (from home with spouse)                                       Social Determinants of Health (SDOH) Interventions    Readmission Risk Interventions Readmission Risk Prevention Plan 07/12/2020  Transportation Screening Complete  PCP or Specialist Appt within 5-7 Days Complete  Home Care Screening Complete  Medication Review (RN CM) Complete  Some recent data might be hidden

## 2020-08-07 NOTE — Progress Notes (Signed)
Pt had a witness fall in his room.  Pt was assisted by NT to go to the bathroom with walker.  He lost his balance near the bathroom.  NT was supporting his back while opening the bathroom door, but unable to support him.  Pt sat down on his buttocks.  No injuries noted. Pt denied pain. However, after pt went back to bed, he stated he could feel that he hit the floor on his left buttock.  No bruise noted at that time.  Dr. Dwyane Dee and pt's daughter, [name] were informed of the fall.  Idolina Primer, RN

## 2020-08-08 ENCOUNTER — Inpatient Hospital Stay (HOSPITAL_COMMUNITY): Payer: Medicare HMO

## 2020-08-08 DIAGNOSIS — N179 Acute kidney failure, unspecified: Secondary | ICD-10-CM | POA: Diagnosis not present

## 2020-08-08 LAB — BASIC METABOLIC PANEL
Anion gap: 7 (ref 5–15)
BUN: 20 mg/dL (ref 8–23)
CO2: 24 mmol/L (ref 22–32)
Calcium: 8.6 mg/dL — ABNORMAL LOW (ref 8.9–10.3)
Chloride: 107 mmol/L (ref 98–111)
Creatinine, Ser: 1.5 mg/dL — ABNORMAL HIGH (ref 0.61–1.24)
GFR, Estimated: 45 mL/min — ABNORMAL LOW (ref >60)
Glucose, Bld: 108 mg/dL — ABNORMAL HIGH (ref 70–99)
Potassium: 4.6 mmol/L (ref 3.5–5.1)
Sodium: 138 mmol/L (ref 135–145)

## 2020-08-08 LAB — MAGNESIUM: Magnesium: 1.8 mg/dL (ref 1.7–2.4)

## 2020-08-08 LAB — CBC
HCT: 32.1 % — ABNORMAL LOW (ref 39.0–52.0)
Hemoglobin: 10.1 g/dL — ABNORMAL LOW (ref 13.0–17.0)
MCH: 31.1 pg (ref 26.0–34.0)
MCHC: 31.5 g/dL (ref 30.0–36.0)
MCV: 98.8 fL (ref 80.0–100.0)
Platelets: 217 10*3/uL (ref 150–400)
RBC: 3.25 MIL/uL — ABNORMAL LOW (ref 4.22–5.81)
RDW: 16.9 % — ABNORMAL HIGH (ref 11.5–15.5)
WBC: 7.1 10*3/uL (ref 4.0–10.5)
nRBC: 0 % (ref 0.0–0.2)

## 2020-08-08 LAB — PHOSPHORUS: Phosphorus: 3.2 mg/dL (ref 2.5–4.6)

## 2020-08-08 NOTE — TOC Progression Note (Signed)
Transition of Care [facility]) - Progression Note    Patient Details  Name: [name] MRN: [id number] Date of Birth: 1931-07-01  Transition of Care [facility]) CM/SW [place], LCSW Phone Number: [phone] 08/08/2020, 11:39 AM  Clinical Narrative:    CSW attempted to reach out to [facility] at [place] to ascertain the status of the authorization. CSW sent text due to VM being full.   TOC team will continue to assist with discharge planning needs.    Expected Discharge Plan: Skilled Nursing Facility Barriers to Discharge: Continued Medical Work up  Expected Discharge Plan and Services Expected Discharge Plan: [place] In-house Referral: Clinical Social Work     Living arrangements for the past 2 months: Single Family Home (from home with spouse)                                       Social Determinants of Health (SDOH) Interventions    Readmission Risk Interventions Readmission Risk Prevention Plan 07/12/2020  Transportation Screening Complete  PCP or Specialist Appt within 5-7 Days Complete  Home Care Screening Complete  Medication Review (RN CM) Complete  Some recent data might be hidden

## 2020-08-08 NOTE — Progress Notes (Signed)
PROGRESS NOTE    [name]  [id number] DOB: 18-Dec-1931 DOA: 07/31/2020 PCP: Midge Minium, MD    Brief Narrative:  This 87 y.o. male with medical history significant for chronic diastolic CHF, CKD stage IIIb with baseline creat 1.4-1.5, hypertension, HLD, obesity,type 2 diabetes mellitus, chronic venous insufficiency, A. Fib, recent admission from 7/10- 7/20 for acute on chronic diastolic CHF, right thigh hematoma while on Coumadin > wt on d/c 214 lb, 233 lb on this admission, on torsemide brought to the hospital due to progressive generalized weakness,  lethargy. Upon his discharge patient started to have weakness, difficulty getting up, sometimes confused, constipation.  He tested positive for COVID on 07/25/20.  No report of shortness of breath,  hypoxia, fever, focal weakness.  Apparently  EMS reported bradycardic to the 20s on the cardiac monitor but unable to capture/no records available.  Assessment & Plan:   Principal Problem:   AKI (acute kidney injury) ([place]) Active Problems:   Hyperlipidemia   Diabetes mellitus without complication ([place])   Essential hypertension   Chronic constipation   COVID-19 virus infection   Chronic diastolic CHF (congestive heart failure) (HCC)  AKI on CKD on stage IIIb  Baseline creatinine 1.4-1.5.  on admission creatinine 2.5.   AKI multifactorial due to overdiuresis from recent CHF admission, decreased p.o. intake, COVID-19 infection.   Renal functions improved with gentle IV fluid hydration.  Encourage oral intake, continue to hold torsemide.  Off IV fluids.    Orthostatic hypotension : 05/04/20:  blood pressure 79/46 on standing and 112/44 on lying:  Given 500 ml NS bolus and continuous IV fluids. Currently hemodynamically stable.  Dehydration/lactic acidosis: resolved with IV fluids.  Encourage increased oral intake   Borderline positive troponin : Stable,  denies any chest pain.      Generalized weakness/failure to thrive:  In  the setting of AKI/ dehydration.  Awaiting SNF placement   Facial droop in the ED Negative for any focal neurological findings aside from global generalized weaknesses,  No evidence of stroke on MRI.  Resolved   Mild confusion/mild acute metabolic encephalopathy Currently stable,  awake alert oriented X 2 Sundowning in the setting of dehydration and AKI:  Daughter reports he has been calling them and at times confused evening and night.   Overall stable improved . He is placed on Seroquel bed time low-dose on 8/2.  Continue ambulation PT OT--recommending SNF   Atrial. Fib:  Heart rate controlled, continue Eliquis.   Metoprolol remains on hold,  can likely resume at lower dose. No episodes of bradycardia of such degree.  Per EMS heart rate was 20, no documentation.   Hyperlipidemia: Continues statin   Diabetes mellitus without complication:  Blood sugar stable, recent A1c was normal at 5.6 on 7/10   Essential hypertension:  Hold metoprolol.  Blood pressure is controlled off medication   COVID-19 virus infection:  Largely asymptomatic, no respiratory symptoms mostly with generalized weakness.   CRP normal,  D-dimer somewhat positive but on Eliquis already.  Status post remdesivir x3 days.   Recommend Isolation x10 days since disease onset /positive date whichever is earlier. (2 more days of isolation likely will be cleared on 08/10/2020)   Chronic diastolic CHF:  Remained stable,, monitoring daily weight, IV fluid discontinued Hypovolemic on admission, currently euvolemic.   He was discharged on torsemide on last admission, consider discharging on lower dose torsemide versus Lasix.   Chronic constipation:  continue MiraLAX daily along with as needed Dulcolax and as  needed Fleet enema for severe constipation.      Right upper extremity arm wound : wound care has been consulted .continue wound care.      DVT prophylaxis: Eliquis Code Status: DNR Family Communication: (No family  at bed side. Disposition Plan:  Status is: Inpatient  Remains inpatient appropriate because:Inpatient level of care appropriate due to severity of illness  Dispo: The patient is from: Home              Anticipated d/c is to: SNF              Patient currently is not medically stable to d/c.   Difficult to place patient No  Basal request peer to peer review the patient's insurance, [employer] Rossetti was called at [phone] extension [phone] information was provided pending callback from Dr. Kathyrn Sheriff    Consultants:   None  Procedures:  Antimicrobials:   Anti-infectives (From admission, onward)    Start     Dose/Rate Route Frequency Ordered Stop   08/01/20 1000  remdesivir 100 mg in sodium chloride 0.9 % 100 mL IVPB       See Hyperspace for full Linked Orders Report.   100 mg 200 mL/hr over 30 Minutes Intravenous Daily 07/31/20 1812 08/02/20 1052   07/31/20 1912  remdesivir 200 mg in sodium chloride 0.9% 250 mL IVPB       See Hyperspace for full Linked Orders Report.   200 mg 580 mL/hr over 30 Minutes Intravenous Once 07/31/20 1812 07/31/20 2211        Subjective: Patient was seen and examined at bedside.  Overnight events noted.   Patient fell, but no head injury. Patient reports feeling weak but denies any other concerns.   He remembers that he fell last night,  denies any head injury. Daughter is concerned about concussion and wants him to have CT head.   Objective: Vitals:   08/07/20 1621 08/07/20 1942 08/08/20 0706 08/08/20 1318  BP: (!) 147/53 124/62 (!) 126/55   Pulse: 89 98    Resp: '18 18 18   '$ Temp: 97.9 F (36.6 C) (!) 97.5 F (36.4 C) 98 F (36.7 C)   TempSrc: Oral Oral    SpO2:  93% 95% 96%  Weight:      Height:        Intake/Output Summary (Last 24 hours) at 08/08/2020 1505 Last data filed at 08/08/2020 0945 Gross per 24 hour  Intake 200 ml  Output --  Net 200 ml   Filed Weights   07/31/20 1800 08/02/20 0415 08/03/20 0512  Weight: 87.5 kg 87.3  kg 92.4 kg    Examination:  General exam: Appears calm and comfortable, not in any acute distress Respiratory system: Clear to auscultation. Respiratory effort normal. Cardiovascular system: S1 & S2 heard, RRR. No JVD, murmurs, rubs, gallops or clicks. No pedal edema. Gastrointestinal system: Abdomen is nondistended, soft and nontender. No organomegaly or masses felt. Normal bowel sounds heard. Central nervous system: Alert and oriented. No focal neurological deficits. Extremities: No edema no cyanosis no clubbing. Skin: No rashes, lesions or ulcers Psychiatry: Judgement and insight appear normal. Mood & affect appropriate.     Data Reviewed: I have personally reviewed following labs and imaging studies  CBC: Recent Labs  Lab 08/03/20 0224 08/08/20 0216  WBC 5.0 7.1  NEUTROABS 3.1  --   HGB 9.8* 10.1*  HCT 30.8* 32.1*  MCV 96.9 98.8  PLT 212 [id number]   Basic Metabolic Panel: Recent Labs  Lab 08/03/20 0224 08/04/20 0145 08/05/20 0908 08/06/20 0250 08/08/20 0216  NA 140 138 141 138 138  K 3.9 3.9 4.2 4.1 4.6  CL 104 104 108 107 107  CO2 '28 27 25 25 24  '$ GLUCOSE 89 97 91 94 108*  BUN 31* 25* '22 20 20  '$ CREATININE 1.68* 1.56* 1.61* 1.43* 1.50*  CALCIUM 9.0 8.7* 8.6* 8.7* 8.6*  MG  --   --   --   --  1.8  PHOS  --   --   --   --  3.2   GFR: Estimated Creatinine Clearance: 39.7 mL/min (A) (by C-G formula based on SCr of 1.5 mg/dL (H)). Liver Function Tests: No results for input(s): AST, ALT, ALKPHOS, BILITOT, PROT, ALBUMIN in the last 168 hours.  No results for input(s): LIPASE, AMYLASE in the last 168 hours. No results for input(s): AMMONIA in the last 168 hours. Coagulation Profile: No results for input(s): INR, PROTIME in the last 168 hours. Cardiac Enzymes: No results for input(s): CKTOTAL, CKMB, CKMBINDEX, TROPONINI in the last 168 hours. BNP (last 3 results) No results for input(s): PROBNP in the last 8760 hours. HbA1C: No results for input(s): HGBA1C in the  last 72 hours. CBG: No results for input(s): GLUCAP in the last 168 hours. Lipid Profile: No results for input(s): CHOL, HDL, LDLCALC, TRIG, CHOLHDL, LDLDIRECT in the last 72 hours. Thyroid Function Tests: No results for input(s): TSH, T4TOTAL, FREET4, T3FREE, THYROIDAB in the last 72 hours. Anemia Panel: No results for input(s): VITAMINB12, FOLATE, FERRITIN, TIBC, IRON, RETICCTPCT in the last 72 hours. Sepsis Labs: No results for input(s): PROCALCITON, LATICACIDVEN in the last 168 hours.  Recent Results (from the past 240 hour(s))  Resp Panel by RT-PCR (Flu A&B, Covid) Nasopharyngeal Swab     Status: Abnormal   Collection Time: 07/31/20 11:08 AM   Specimen: Nasopharyngeal Swab; Nasopharyngeal(NP) swabs in vial transport medium  Result Value Ref Range Status   SARS Coronavirus 2 by RT PCR POSITIVE (A) NEGATIVE Final    Comment: RESULT CALLED TO, READ BACK BY AND VERIFIED WITH: RN S.WOODWORTH ON [id number] AT 1240 BY E.PARRISH (NOTE) SARS-CoV-2 target nucleic acids are DETECTED.  The SARS-CoV-2 RNA is generally detectable in upper respiratory specimens during the acute phase of infection. Positive results are indicative of the presence of the identified virus, but do not rule out bacterial infection or co-infection with other pathogens not detected by the test. Clinical correlation with patient history and other diagnostic information is necessary to determine patient infection status. The expected result is Negative.  Fact Sheet for Patients: EntrepreneurPulse.com.au  Fact Sheet for Healthcare Providers: IncredibleEmployment.be  This test is not yet approved or cleared by the [place] FDA and  has been authorized for detection and/or diagnosis of SARS-CoV-2 by FDA under an Emergency Use Authorization (EUA).  This EUA will remain in effect (meaning th is test can be used) for the duration of  the COVID-19 declaration under Section  564(b)(1) of the Act, 21 U.S.C. section 360bbb-3(b)(1), unless the authorization is terminated or revoked sooner.     Influenza A by PCR NEGATIVE NEGATIVE Final   Influenza B by PCR NEGATIVE NEGATIVE Final    Comment: (NOTE) The Xpert Xpress SARS-CoV-2/FLU/RSV plus assay is intended as an aid in the diagnosis of influenza from Nasopharyngeal swab specimens and should not be used as a sole basis for treatment. Nasal washings and aspirates are unacceptable for Xpert Xpress SARS-CoV-2/FLU/RSV testing.  Fact Sheet for Patients: EntrepreneurPulse.com.au  Fact Sheet for Healthcare Providers: IncredibleEmployment.be  This test is not yet approved or cleared by the [place] FDA and has been authorized for detection and/or diagnosis of SARS-CoV-2 by FDA under an Emergency Use Authorization (EUA). This EUA will remain in effect (meaning this test can be used) for the duration of the COVID-19 declaration under Section 564(b)(1) of the Act, 21 U.S.C. section 360bbb-3(b)(1), unless the authorization is terminated or revoked.  Performed at [facility], [place] [address], [place], [place] [address]   Urine Culture     Status: None   Collection Time: 07/31/20 12:32 PM   Specimen: Urine, Clean Catch  Result Value Ref Range Status   Specimen Description URINE, CLEAN CATCH  Final   Special Requests NONE  Final   Culture   Final    NO GROWTH Performed at [facility], [place] [address] [place], [place] [address]    Report Status 08/01/2020 FINAL  Final     Radiology Studies: No results found.  Scheduled Meds:  albuterol  2 puff Inhalation Q6H   allopurinol  300 mg Oral Daily   apixaban  2.5 mg Oral BID   chlorpheniramine-HYDROcodone  5 mL Oral Q12H   docusate sodium  100 mg Oral BID   polyethylene glycol  17 g Oral Daily   QUEtiapine  12.5 mg Oral QHS   simvastatin  20 mg Oral q1800   vitamin B-12  500 mcg Oral Daily   Continuous  Infusions:  sodium chloride Stopped (08/02/20 2101)     LOS: 7 days    Time spent: 25 mins    Zakaree Mcclenahan, MD [facility]   If 7PM-7AM, please contact night-coverage

## 2020-08-08 NOTE — Progress Notes (Addendum)
Patients daughter came in this afternoon and was concerned about fall from yesterday. She was concerned he had a concussion and family reports the patient was speaking on phone with family today and didn't sound "normal." I have spoken with patient several times today and for most part he is totally oriented and alert. No neuro changes noted. Patient stated today that he remembers hitting his head yesterday during the fall.  I notified Dr. Dwyane Dee of families concerns, he ordered CT of head without contrast. Family at bedside updated

## 2020-08-09 LAB — BASIC METABOLIC PANEL
Anion gap: 6 (ref 5–15)
BUN: 19 mg/dL (ref 8–23)
CO2: 24 mmol/L (ref 22–32)
Calcium: 8.7 mg/dL — ABNORMAL LOW (ref 8.9–10.3)
Chloride: 106 mmol/L (ref 98–111)
Creatinine, Ser: 1.59 mg/dL — ABNORMAL HIGH (ref 0.61–1.24)
GFR, Estimated: 42 mL/min — ABNORMAL LOW (ref >60)
Glucose, Bld: 108 mg/dL — ABNORMAL HIGH (ref 70–99)
Potassium: 4.7 mmol/L (ref 3.5–5.1)
Sodium: 136 mmol/L (ref 135–145)

## 2020-08-09 NOTE — TOC Progression Note (Addendum)
Transition of Care [facility]) - Progression Note    Patient Details  Name: ANUEL QUESNEL MRN: [id number] Date of Birth: 01-17-1931  Transition of Care [facility]) CM/SW [place], [place] Phone Number: 08/09/2020, 1:08 PM  Clinical Narrative:      CSW spoke with Perrin Smack with [facility] who requested for MD number so that patients insurance can call MD to complete Peer to peer. MD provided telephone number to CSW and CSW provided to [facility] to give to patients insurance. MD confirmed with CSW that peer to peer was completed. Perrin Smack is awaiting call from patients insurance with determination. [name] has been updated. CSW will continue to follow and assist with dc planning needs.  Expected Discharge Plan: Skilled Nursing Facility Barriers to Discharge: Continued Medical Work up  Expected Discharge Plan and Services Expected Discharge Plan: [place] In-house Referral: Clinical Social Work     Living arrangements for the past 2 months: Single Family Home (from home with spouse)                                       Social Determinants of Health (SDOH) Interventions    Readmission Risk Interventions Readmission Risk Prevention Plan 07/12/2020  Transportation Screening Complete  PCP or Specialist Appt within 5-7 Days Complete  Home Care Screening Complete  Medication Review (RN CM) Complete  Some recent data might be hidden

## 2020-08-09 NOTE — Progress Notes (Deleted)
Cardiology Office Note:    Date:  08/09/2020   ID:  [name], DOB 1931/01/26, MRN [id number]  PCP:  Midge Minium, MD  Cardiologist:  Sinclair Grooms, MD   Referring MD: Midge Minium, MD   No chief complaint on file.   History of Present Illness:    [name] is a 87 y.o. male with a hx of history of chronic diastolic heart failure, CKD stage IIIb, hypertension, hyperlipidemia, obesity, type 2 diabetes, atrial fibrillation, and recent COVID-19 infection 07/25/2020.  ***  Past Medical History:  Diagnosis Date   Abnormal finding on imaging of liver    Anemia    Aortic insufficiency    Blood transfusion without reported diagnosis    Chronic atrial fibrillation (HCC)    Chronic constipation    colace helps   Chronic diastolic CHF (congestive heart failure) (HCC)    Chronic venous insufficiency    CKD (chronic kidney disease), stage III (HCC)    Diabetes mellitus ([place])    Gout    Hyperlipidemia    Hypertension    Osteopenia    Stercoral ulcer of rectum    Varicose veins of both lower extremities with complications    Vitamin B12 deficiency     Past Surgical History:  Procedure Laterality Date   BUNIONECTOMY  2006   COLONOSCOPY N/A 01/22/2016   Procedure: COLONOSCOPY;  Surgeon: Manus Gunning, MD;  Location: [facility];  Service: Gastroenterology;  Laterality: N/A;   HEMORRHOID SURGERY  1970   TONSILLECTOMY AND ADENOIDECTOMY  1945    Current Medications: No outpatient medications have been marked as taking for the 08/10/20 encounter (Appointment) with Belva Crome, MD.     Allergies:   Influenza vac split [influenza virus vaccine]   Social History   Socioeconomic History   Marital status: Married    Spouse name: Not on file   Number of children: 3   Years of education: Not on file   Highest education level: Not on file  Occupational History   Occupation: retired  Tobacco Use   Smoking status: Former    Types: Cigarettes    Smokeless tobacco: Never  Scientific laboratory technician Use: Never used  Substance and Sexual Activity   Alcohol use: No   Drug use: No   Sexual activity: Not on file  Other Topics Concern   Not on file  Social History Narrative   Married, 3 daughters, 4 grandchildren.      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   Retired 2012.      Remote smoking hx: quit 55 yrs ago.  No alcohol in 55 yrs.      Social Determinants of Health   Financial Resource Strain: Not on file  Food Insecurity: Not on file  Transportation Needs: Not on file  Physical Activity: Not on file  Stress: Not on file  Social Connections: Not on file     Family History: The patient's family history includes Alcohol abuse in his father; Diabetes in his father; Heart disease in his father; Obesity in his brother and daughter.  ROS:   Please see the history of present illness.    *** All other systems reviewed and are negative.  EKGs/Labs/Other Studies Reviewed:    The following studies were reviewed today: ***  EKG:  EKG ***  Recent Labs: 07/31/2020: B Natriuretic Peptide 521.2; TSH 1.434 08/01/2020: ALT 10 08/08/2020: Hemoglobin  10.1; Magnesium 1.8; Platelets 217 08/09/2020: BUN 19; Creatinine, Ser 1.59; Potassium 4.7; Sodium 136  Recent Lipid Panel    Component Value Date/Time   CHOL 96 06/18/2020 1315   TRIG 46.0 06/18/2020 1315   HDL 33.50 (L) 06/18/2020 1315   CHOLHDL 3 06/18/2020 1315   VLDL 9.2 06/18/2020 1315   LDLCALC 54 06/18/2020 1315    Physical Exam:    VS:  There were no vitals taken for this visit.    Wt Readings from Last 3 Encounters:  08/03/20 203 lb 11.3 oz (92.4 kg)  07/26/20 198 lb (89.8 kg)  07/21/20 214 lb (97.1 kg)     GEN: ***. No acute distress HEENT: Normal NECK: No JVD. LYMPHATICS: No lymphadenopathy CARDIAC: *** murmur. RRR *** gallop, or edema. VASCULAR: *** Normal Pulses. No bruits. RESPIRATORY:  Clear to  auscultation without rales, wheezing or rhonchi  ABDOMEN: Soft, non-tender, non-distended, No pulsatile mass, MUSCULOSKELETAL: No deformity  SKIN: Warm and dry NEUROLOGIC:  Alert and oriented x 3 PSYCHIATRIC:  Normal affect   ASSESSMENT:    No diagnosis found. PLAN:    In order of problems listed above:  ***   Medication Adjustments/Labs and Tests Ordered: Current medicines are reviewed at length with the patient today.  Concerns regarding medicines are outlined above.  No orders of the defined types were placed in this encounter.  No orders of the defined types were placed in this encounter.   There are no Patient Instructions on file for this visit.   Signed, Sinclair Grooms, MD  08/09/2020 7:33 AM    Welcome

## 2020-08-09 NOTE — Progress Notes (Signed)
PROGRESS NOTE    [name]  [id number] DOB: 1931/04/15 DOA: 07/31/2020 PCP: Midge Minium, MD    Brief Narrative:  This 87 y.o. male with medical history significant for chronic diastolic CHF, CKD stage IIIb with baseline creat 1.4-1.5, hypertension, HLD, obesity,type 2 diabetes mellitus, chronic venous insufficiency, A. Fib, recent admission from 7/10- 7/20 for acute on chronic diastolic CHF, right thigh hematoma while on Coumadin > wt on d/c 214 lb, 233 lb on this admission, on torsemide brought to the hospital due to progressive generalized weakness,  lethargy. Upon his discharge patient started to have weakness, difficulty getting up, sometimes confused, constipation.  He tested positive for COVID on 07/25/20.  No report of shortness of breath,  hypoxia, fever, focal weakness.  Apparently  EMS reported bradycardic to the 20s on the cardiac monitor but unable to capture/no records available.  Assessment & Plan:   Principal Problem:   AKI (acute kidney injury) ([place]) Active Problems:   Hyperlipidemia   Diabetes mellitus without complication ([place])   Essential hypertension   Chronic constipation   COVID-19 virus infection   Chronic diastolic CHF (congestive heart failure) ([facility])  AKI on CKD on stage IIIb  Baseline creatinine 1.4-1.5.  on admission creatinine 2.5.   AKI multifactorial due to overdiuresis from recent CHF admission, decreased p.o. intake, COVID-19 infection.   Renal functions improved with gentle IV fluid hydration.  Encourage oral intake, continue to hold torsemide.  Off IV fluids.    Orthostatic hypotension : 05/04/20:  blood pressure 79/46 on standing and 112/44 on lying:  Currently hemodynamically stable. BP improved.  Dehydration/lactic acidosis: resolved with IV fluids.  Encourage increased oral intake   Borderline positive troponin : Stable,  denies any chest pain.      Generalized weakness/failure to thrive:  In the setting of AKI/ dehydration.   Awaiting SNF placement   Facial droop in the ED Negative for any focal neurological findings aside from global generalized weaknesses,  No evidence of stroke on MRI.  Resolved   Mild confusion/mild acute metabolic encephalopathy Currently stable,  awake alert oriented X 2 Sundowning in the setting of dehydration and AKI:  Daughter reports he has been calling them and at times confused evening and night.   Overall stable improved . He is placed on Seroquel bed time low-dose on 8/2.  Continue ambulation PT OT--recommending SNF   Atrial. Fib:  Heart rate controlled, continue Eliquis.   Metoprolol remains on hold,  can likely resume at lower dose. No episodes of bradycardia of such degree.  Per EMS heart rate was 20, no documentation.   Hyperlipidemia: Continues statin   Diabetes mellitus without complication:  Blood sugar stable, recent A1c was normal at 5.6 on 7/10   Essential hypertension:  Hold metoprolol.  Blood pressure is controlled off medication   COVID-19 virus infection:  Largely asymptomatic, no respiratory symptoms mostly with generalized weakness.   CRP normal,  D-dimer somewhat positive but on Eliquis already.  Status post remdesivir x3 days.   Recommend Isolation x10 days since disease onset /positive date whichever is earlier. (1 more days of isolation likely will be cleared on 08/10/2020)   Chronic diastolic CHF:  Remained stable,, monitoring daily weight, IV fluid discontinued Hypovolemic on admission, currently euvolemic.   He was discharged on torsemide on last admission, consider discharging on lower dose torsemide versus Lasix.   Chronic constipation:  continue MiraLAX daily,  as needed Dulcolax and as needed Fleet enema for severe constipation.  Right upper extremity arm wound : wound care has been consulted .continue wound care.      DVT prophylaxis: Eliquis Code Status: DNR Family Communication: (No family at bed side. Disposition Plan:  Status  is: Inpatient  Remains inpatient appropriate because:Inpatient level of care appropriate due to severity of illness  Dispo: The patient is from: Home              Anticipated d/c is to: SNF              Patient currently is not medically stable to d/c.   Difficult to place patient No  .  Peer 2 peer completed.  Patient approved for skilled nursing facility.   Consultants:   None  Procedures:  Antimicrobials:   Anti-infectives (From admission, onward)    Start     Dose/Rate Route Frequency Ordered Stop   08/01/20 1000  remdesivir 100 mg in sodium chloride 0.9 % 100 mL IVPB       See Hyperspace for full Linked Orders Report.   100 mg 200 mL/hr over 30 Minutes Intravenous Daily 07/31/20 1812 08/02/20 1052   07/31/20 1912  remdesivir 200 mg in sodium chloride 0.9% 250 mL IVPB       See Hyperspace for full Linked Orders Report.   200 mg 580 mL/hr over 30 Minutes Intravenous Once 07/31/20 1812 07/31/20 2211        Subjective: Patient is seen and examined at bedside.  Overnight events noted. Patient is still reports feeling weak but denies any other concerns. CT head was done after fall which is unremarkable for any acute abnormality.   Objective: Vitals:   08/08/20 1619 08/08/20 2224 08/09/20 0456 08/09/20 1402  BP: (!) 131/51 (!) 158/72 (!) 141/69 130/60  Pulse:   (!) 116   Resp: '18  18 18  '$ Temp: 97.9 F (36.6 C) 98.7 F (37.1 C) 98.6 F (37 C) 97.7 F (36.5 C)  TempSrc: Oral Oral Oral Oral  SpO2: 100% 99% 96% 93%  Weight:      Height:       No intake or output data in the 24 hours ending 08/09/20 1529  Filed Weights   07/31/20 1800 08/02/20 0415 08/03/20 0512  Weight: 87.5 kg 87.3 kg 92.4 kg    Examination:  General exam: Appears calm and comfortable, not in any acute distress.  Respiratory system: Clear to auscultation. Respiratory effort normal. Cardiovascular system: S1 & S2 heard, RRR. No JVD, murmurs, rubs, gallops or clicks. No pedal  edema. Gastrointestinal system: Abdomen is nondistended, soft and nontender. No organomegaly or masses felt. Normal bowel sounds heard. Central nervous system: Alert and oriented x 2. No focal neurological deficits. Extremities: No edema no cyanosis no clubbing. Skin: No rashes, lesions or ulcers Psychiatry: Judgement and insight appear normal. Mood & affect appropriate.     Data Reviewed: I have personally reviewed following labs and imaging studies  CBC: Recent Labs  Lab 08/03/20 0224 08/08/20 0216  WBC 5.0 7.1  NEUTROABS 3.1  --   HGB 9.8* 10.1*  HCT 30.8* 32.1*  MCV 96.9 98.8  PLT 212 [id number]   Basic Metabolic Panel: Recent Labs  Lab 08/04/20 0145 08/05/20 0908 08/06/20 0250 08/08/20 0216 08/09/20 0057  NA 138 141 138 138 136  K 3.9 4.2 4.1 4.6 4.7  CL 104 108 107 107 106  CO2 '27 25 25 24 24  '$ GLUCOSE 97 91 94 108* 108*  BUN 25* '22 20 20 19  '$ CREATININE 1.56*  1.61* 1.43* 1.50* 1.59*  CALCIUM 8.7* 8.6* 8.7* 8.6* 8.7*  MG  --   --   --  1.8  --   PHOS  --   --   --  3.2  --    GFR: Estimated Creatinine Clearance: 37.4 mL/min (A) (by C-G formula based on SCr of 1.59 mg/dL (H)). Liver Function Tests: No results for input(s): AST, ALT, ALKPHOS, BILITOT, PROT, ALBUMIN in the last 168 hours.  No results for input(s): LIPASE, AMYLASE in the last 168 hours. No results for input(s): AMMONIA in the last 168 hours. Coagulation Profile: No results for input(s): INR, PROTIME in the last 168 hours. Cardiac Enzymes: No results for input(s): CKTOTAL, CKMB, CKMBINDEX, TROPONINI in the last 168 hours. BNP (last 3 results) No results for input(s): PROBNP in the last 8760 hours. HbA1C: No results for input(s): HGBA1C in the last 72 hours. CBG: No results for input(s): GLUCAP in the last 168 hours. Lipid Profile: No results for input(s): CHOL, HDL, LDLCALC, TRIG, CHOLHDL, LDLDIRECT in the last 72 hours. Thyroid Function Tests: No results for input(s): TSH, T4TOTAL, FREET4,  T3FREE, THYROIDAB in the last 72 hours. Anemia Panel: No results for input(s): VITAMINB12, FOLATE, FERRITIN, TIBC, IRON, RETICCTPCT in the last 72 hours. Sepsis Labs: No results for input(s): PROCALCITON, LATICACIDVEN in the last 168 hours.  Recent Results (from the past 240 hour(s))  Resp Panel by RT-PCR (Flu A&B, Covid) Nasopharyngeal Swab     Status: Abnormal   Collection Time: 07/31/20 11:08 AM   Specimen: Nasopharyngeal Swab; Nasopharyngeal(NP) swabs in vial transport medium  Result Value Ref Range Status   SARS Coronavirus 2 by RT PCR POSITIVE (A) NEGATIVE Final    Comment: RESULT CALLED TO, READ BACK BY AND VERIFIED WITH: RN S.WOODWORTH ON [id number] AT 1240 BY E.PARRISH (NOTE) SARS-CoV-2 target nucleic acids are DETECTED.  The SARS-CoV-2 RNA is generally detectable in upper respiratory specimens during the acute phase of infection. Positive results are indicative of the presence of the identified virus, but do not rule out bacterial infection or co-infection with other pathogens not detected by the test. Clinical correlation with patient history and other diagnostic information is necessary to determine patient infection status. The expected result is Negative.  Fact Sheet for Patients: EntrepreneurPulse.com.au  Fact Sheet for Healthcare Providers: IncredibleEmployment.be  This test is not yet approved or cleared by the [place] FDA and  has been authorized for detection and/or diagnosis of SARS-CoV-2 by FDA under an Emergency Use Authorization (EUA).  This EUA will remain in effect (meaning th is test can be used) for the duration of  the COVID-19 declaration under Section 564(b)(1) of the Act, 21 U.S.C. section 360bbb-3(b)(1), unless the authorization is terminated or revoked sooner.     Influenza A by PCR NEGATIVE NEGATIVE Final   Influenza B by PCR NEGATIVE NEGATIVE Final    Comment: (NOTE) The Xpert Xpress  SARS-CoV-2/FLU/RSV plus assay is intended as an aid in the diagnosis of influenza from Nasopharyngeal swab specimens and should not be used as a sole basis for treatment. Nasal washings and aspirates are unacceptable for Xpert Xpress SARS-CoV-2/FLU/RSV testing.  Fact Sheet for Patients: EntrepreneurPulse.com.au  Fact Sheet for Healthcare Providers: IncredibleEmployment.be  This test is not yet approved or cleared by the [place] FDA and has been authorized for detection and/or diagnosis of SARS-CoV-2 by FDA under an Emergency Use Authorization (EUA). This EUA will remain in effect (meaning this test can be used) for the duration of the COVID-19  declaration under Section 564(b)(1) of the Act, 21 U.S.C. section 360bbb-3(b)(1), unless the authorization is terminated or revoked.  Performed at [facility], [place] [address], [place], [place] [address]   Urine Culture     Status: None   Collection Time: 07/31/20 12:32 PM   Specimen: Urine, Clean Catch  Result Value Ref Range Status   Specimen Description URINE, CLEAN CATCH  Final   Special Requests NONE  Final   Culture   Final    NO GROWTH Performed at [facility], [place] [address], [place], [place] [address]    Report Status 08/01/2020 FINAL  Final     Radiology Studies: CT HEAD WO CONTRAST (5MM)  Result Date: 08/08/2020 CLINICAL DATA:  Nystagmus EXAM: CT HEAD WITHOUT CONTRAST TECHNIQUE: Contiguous axial images were obtained from the base of the skull through the vertex without intravenous contrast. COMPARISON:  07/31/2020 FINDINGS: Brain: Normal anatomic configuration. Moderate parenchymal volume loss is commensurate with the patient's age. Mild periventricular white matter changes are present likely reflecting the sequela of small vessel ischemia. No abnormal intra or extra-axial mass lesion or fluid collection. No abnormal mass effect or midline shift. No evidence of acute  intracranial hemorrhage or infarct. Borderline ventriculomegaly is commensurate with the degree of parenchymal volume loss and likely reflects ex vacuo dilation. Cerebellum unremarkable. Vascular: No asymmetric hyperdense vasculature at the skull base. Extensive atherosclerotic calcification within the carotid siphons. Skull: Intact Sinuses/Orbits: Paranasal sinuses are clear. Orbits are unremarkable. Other: Mastoid air cells and middle ear cavities are clear. IMPRESSION: No acute intracranial abnormality.  Stable senescent change. Electronically Signed   By: Fidela Salisbury MD   On: 08/08/2020 16:36    Scheduled Meds:  albuterol  2 puff Inhalation Q6H   allopurinol  300 mg Oral Daily   apixaban  2.5 mg Oral BID   chlorpheniramine-HYDROcodone  5 mL Oral Q12H   docusate sodium  100 mg Oral BID   polyethylene glycol  17 g Oral Daily   QUEtiapine  12.5 mg Oral QHS   simvastatin  20 mg Oral q1800   vitamin B-12  500 mcg Oral Daily   Continuous Infusions:  sodium chloride Stopped (08/02/20 2101)     LOS: 8 days    Time spent: 25 mins    Evy Lutterman, MD [facility]   If 7PM-7AM, please contact night-coverage

## 2020-08-10 ENCOUNTER — Ambulatory Visit: Payer: Medicare HMO | Admitting: Interventional Cardiology

## 2020-08-10 DIAGNOSIS — E872 Acidosis: Secondary | ICD-10-CM | POA: Diagnosis not present

## 2020-08-10 DIAGNOSIS — Z7401 Bed confinement status: Secondary | ICD-10-CM | POA: Diagnosis not present

## 2020-08-10 DIAGNOSIS — I509 Heart failure, unspecified: Secondary | ICD-10-CM | POA: Diagnosis not present

## 2020-08-10 DIAGNOSIS — K5901 Slow transit constipation: Secondary | ICD-10-CM | POA: Diagnosis not present

## 2020-08-10 DIAGNOSIS — I5033 Acute on chronic diastolic (congestive) heart failure: Secondary | ICD-10-CM | POA: Diagnosis not present

## 2020-08-10 DIAGNOSIS — I959 Hypotension, unspecified: Secondary | ICD-10-CM | POA: Diagnosis not present

## 2020-08-10 DIAGNOSIS — R41841 Cognitive communication deficit: Secondary | ICD-10-CM | POA: Diagnosis not present

## 2020-08-10 DIAGNOSIS — Z741 Need for assistance with personal care: Secondary | ICD-10-CM | POA: Diagnosis not present

## 2020-08-10 DIAGNOSIS — R319 Hematuria, unspecified: Secondary | ICD-10-CM | POA: Diagnosis not present

## 2020-08-10 DIAGNOSIS — E119 Type 2 diabetes mellitus without complications: Secondary | ICD-10-CM | POA: Diagnosis not present

## 2020-08-10 DIAGNOSIS — R1312 Dysphagia, oropharyngeal phase: Secondary | ICD-10-CM | POA: Diagnosis not present

## 2020-08-10 DIAGNOSIS — U071 COVID-19: Secondary | ICD-10-CM | POA: Diagnosis not present

## 2020-08-10 DIAGNOSIS — R278 Other lack of coordination: Secondary | ICD-10-CM | POA: Diagnosis not present

## 2020-08-10 DIAGNOSIS — N179 Acute kidney failure, unspecified: Secondary | ICD-10-CM | POA: Diagnosis not present

## 2020-08-10 DIAGNOSIS — M6281 Muscle weakness (generalized): Secondary | ICD-10-CM | POA: Diagnosis not present

## 2020-08-10 DIAGNOSIS — R7303 Prediabetes: Secondary | ICD-10-CM | POA: Diagnosis not present

## 2020-08-10 DIAGNOSIS — R2681 Unsteadiness on feet: Secondary | ICD-10-CM | POA: Diagnosis not present

## 2020-08-10 LAB — BASIC METABOLIC PANEL
Anion gap: 6 (ref 5–15)
BUN: 19 mg/dL (ref 8–23)
CO2: 24 mmol/L (ref 22–32)
Calcium: 8.7 mg/dL — ABNORMAL LOW (ref 8.9–10.3)
Chloride: 105 mmol/L (ref 98–111)
Creatinine, Ser: 1.35 mg/dL — ABNORMAL HIGH (ref 0.61–1.24)
GFR, Estimated: 51 mL/min — ABNORMAL LOW (ref >60)
Glucose, Bld: 106 mg/dL — ABNORMAL HIGH (ref 70–99)
Potassium: 4.4 mmol/L (ref 3.5–5.1)
Sodium: 135 mmol/L (ref 135–145)

## 2020-08-10 MED ORDER — QUETIAPINE FUMARATE 25 MG PO TABS: 12.5000 mg | ORAL_TABLET | Freq: Every day | ORAL | 1 refills | Status: AC

## 2020-08-10 MED ORDER — METOPROLOL SUCCINATE ER 50 MG PO TB24: 25.0000 mg | ORAL_TABLET | Freq: Every day | ORAL | 1 refills | Status: AC

## 2020-08-10 MED ORDER — TORSEMIDE 40 MG PO TABS: 20.0000 mg | ORAL_TABLET | Freq: Every day | ORAL | 0 refills | Status: DC

## 2020-08-10 NOTE — TOC Transition Note (Signed)
Transition of Care [facility]) - CM/SW Discharge Note   Patient Details  Name: [name] MRN: [id number] Date of Birth: [birth year]  [facility] CM/SW Contact:  Vinie Sill, LCSW Phone Number: 08/10/2020, 2:31 PM   Clinical Narrative:     Patient will Discharge to: [facility] Discharge Date: 08/10/2020 Family Notified: daughter,Stephanie  Transport By: Corey Harold   Per MD patient is ready for discharge. RN, patient, and facility notified of discharge. Discharge Summary sent to facility. RN given number for [phone]. Ambulance transport requested for patient.   Clinical Social Worker signing off.  Thurmond Butts, MSW, LCSW Clinical Social Worker    Final next level of care: Skilled Nursing Facility Barriers to Discharge: Barriers Resolved   Patient Goals and CMS Choice Patient states their goals for this hospitalization and ongoing recovery are:: to go to SNF CMS Medicare.gov Compare Post Acute Care list provided to:: Patient Choice offered to / list presented to : Patient  Discharge Placement              Patient chooses bed at: [facility] Patient to be transferred to facility by: [place] Name of family member notified: daughter,stephanie Patient and family notified of of transfer: 08/10/20  Discharge Plan and Services In-house Referral: Clinical Social Work                                   Social Determinants of Health ([place]) Interventions     Readmission Risk Interventions Readmission Risk Prevention Plan 07/12/2020  Transportation Screening Complete  PCP or Specialist Appt within 5-7 Days Complete  Home Care Screening Complete  Medication Review (RN CM) Complete  Some recent data might be hidden

## 2020-08-10 NOTE — Discharge Instructions (Signed)
Patient is being discharged to skilled nursing facility for rehab. Its been 10 days patient is diagnosed with COVID so he is off isolation. Patient's torsemide dose has been reduced to 20 mg daily.

## 2020-08-10 NOTE — Progress Notes (Addendum)
Attempted to call back again. No one picking up the phone.  Attempted to call report. Will try again later

## 2020-08-10 NOTE — Discharge Summary (Addendum)
Physician Discharge Summary  [name] [id number] DOB: 12-29-1931 DOA: 07/31/2020  PCP: Midge Minium, MD  Admit date: 07/31/2020  Discharge date: 08/10/2020  Admitted From: Home.  Disposition:  SNF  Recommendations for Outpatient Follow-up:  Follow up with PCP in 1-2 weeks. Please obtain BMP/CBC in one week Patient is being discharged to skilled nursing facility for rehab. Its been 10 days patient is diagnosed with COVID so he is off isolation. Patient's torsemide dose has been reduced to 20 mg daily. Advised to resume metoprolol at lower dose.  Home Health:None Equipment/Devices:None  Discharge Condition: Good CODE STATUS:DNR Diet recommendation: Heart Healthy   Brief [facility] course: This 87 y.o. male with medical history significant for chronic diastolic CHF, CKD stage IIIb with baseline creat 1.4-1.5, hypertension, HLD, obesity,type 2 diabetes mellitus, chronic venous insufficiency, A. Fib, recent admission from 7/10- 7/20 for acute on chronic diastolic CHF, right thigh hematoma while on Coumadin > wt on d/c 214 lb, 233 lb on this admission, on torsemide brought to the hospital due to progressive generalized weakness,  lethargy. Upon his discharge patient started to have weakness, difficulty getting up, sometimes confusion, constipation.  He tested positive for COVID on 07/25/20.  No report of shortness of breath,  hypoxia, fever, focal weakness.  Apparently  EMS reported bradycardic to the 20s on the cardiac monitor but unable to capture/no records available. Patient was admitted for multiple issues.  AKI on CKD stage III.  Serum creatinine back to baseline.  He was found to have orthostatic hypotension which has significantly improved with IV hydration.  Dehydration and lactic acidosis has resolved, patient has fallen twice in the hospital , repeat CT head negative for any acute stroke.  MRI no evidence of stroke.  Patient's heart rate remains controlled and in  60s. Patient was continued on Eliquis,  metoprolol was resumed on lower dosing.  Patient remained euvolemic and is resumed on low-dose torsemide at discharge.  Patient was diagnosed with COVID-positive on 07/25/2020.  He is off isolation now.  Patient feels better and wants to be discharged.  Patient is being discharged to skilled nursing facility for rehab.  He was managed for below problems.   Discharge Diagnoses:  Principal Problem:   AKI (acute kidney injury) ([place]) Active Problems:   Hyperlipidemia   Diabetes mellitus without complication ([place])   Essential hypertension   Chronic constipation   COVID-19 virus infection   Chronic diastolic CHF (congestive heart failure) ([facility])  AKI on CKD on stage IIIb  Baseline creatinine 1.4-1.5.  on admission creatinine 2.5.   AKI multifactorial due to overdiuresis from recent CHF admission, decreased p.o. intake, COVID-19 infection.   Renal functions improved with gentle IV fluid hydration. Encourage oral intake,  Off IV fluids.     Orthostatic hypotension : 05/04/20:  blood pressure 79/46 on standing and 112/44 on lying: Currently hemodynamically stable. BP improved.   Dehydration/lactic acidosis: resolved with IV fluids.  Encourage increased oral intake   Borderline positive troponin : Stable,  denies any chest pain.      Generalized weakness/failure to thrive:  In the setting of AKI/ dehydration.  Awaiting SNF placement   Facial droop in the ED Negative for any focal neurological findings aside from global generalized weaknesses,  No evidence of stroke on MRI.  Resolved   Mild confusion/mild acute metabolic encephalopathy Currently stable,  awake alert oriented X 2 Sundowning in the setting of dehydration and AKI: Daughter reports he has been calling them and at times  confused evening and night.   Overall stable improved . He is placed on Seroquel bed time low-dose on 8/2. Continue ambulation PT OT--recommending SNF   Atrial. Fib:   Heart rate controlled, continue Eliquis.   Metoprolol remains on hold,  can likely resume at lower dose. No episodes of bradycardia of such degree.  Per EMS heart rate was 20, no documentation. Heart rate remains above 60    Hyperlipidemia: Continues statin   Diabetes mellitus without complication: Blood sugar stable, recent A1c was normal at 5.6 on 7/10   Essential hypertension:  Blood pressure is controlled off medication.   COVID-19 virus infection:  Largely asymptomatic, no respiratory symptoms mostly with generalized weakness.   CRP normal,  D-dimer somewhat positive but on Eliquis already.  Status post remdesivir x3 days.   Recommend Isolation x10 days since disease onset /positive date whichever is earlier. (1 more days of isolation likely will be cleared on 08/10/2020)   Chronic diastolic CHF:  Remained stable,, monitoring daily weight, IV fluid discontinued Hypovolemic on admission, currently euvolemic.   He was discharged on torsemide on last admission, consider discharging on lower dose torsemide versus Lasix.   Chronic constipation: continue MiraLAX daily,  as needed Dulcolax and as needed Fleet enema for severe constipation.       Right upper extremity arm wound : continue wound care.      Discharge Instructions  Discharge Instructions     Call MD for:  difficulty breathing, headache or visual disturbances   Complete by: As directed    Call MD for:  persistant dizziness or light-headedness   Complete by: As directed    Call MD for:  persistant nausea and vomiting   Complete by: As directed    Diet - low sodium heart healthy   Complete by: As directed    Diet Carb Modified   Complete by: As directed    Discharge instructions   Complete by: As directed    Advised to follow-up with primary care physician in 1 week. Patient is being discharged to skilled nursing facility for rehab. Its been 10 days patient is diagnosed with COVID so he is off  isolation. Patient's torsemide dose has been reduced to 20 mg daily.   Discharge wound care:   Complete by: As directed    Continue wound care at nursing home.   Increase activity slowly   Complete by: As directed       Allergies as of 08/10/2020       Reactions   Influenza Vac Split [influenza Virus Vaccine] Nausea And Vomiting        Medication List     STOP taking these medications    nirmatrelvir/ritonavir EUA Tabs Commonly known as: PAXLOVID       TAKE these medications    allopurinol 300 MG tablet Commonly known as: ZYLOPRIM TAKE 1 TABLET BY MOUTH EVERY DAY   apixaban 2.5 MG Tabs tablet Commonly known as: Eliquis Take 1 tablet (2.5 mg total) by mouth 2 (two) times daily.   CALCIUM 1200 PO Take 1 tablet by mouth daily.   docusate sodium 100 MG capsule Commonly known as: COLACE Take 100 mg by mouth daily.   IRON PO Take 1 tablet by mouth daily.   metFORMIN 500 MG tablet Commonly known as: GLUCOPHAGE TAKE 1 TABLET BY MOUTH EVERY DAY   metoprolol succinate 50 MG 24 hr tablet Commonly known as: TOPROL-XL Take 0.5 tablets (25 mg total) by mouth daily. Take with or immediately  following a meal. What changed:  how much to take additional instructions   QUEtiapine 25 MG tablet Commonly known as: SEROQUEL Take 0.5 tablets (12.5 mg total) by mouth at bedtime.   simvastatin 20 MG tablet Commonly known as: ZOCOR TAKE 1 TABLET BY MOUTH EVERY DAY   Torsemide 40 MG Tabs Take 20 mg by mouth daily. What changed:  how much to take when to take this   vitamin B-12 500 MCG tablet Commonly known as: CYANOCOBALAMIN Take 500 mcg by mouth daily.               Discharge Care Instructions  (From admission, onward)           Start     Ordered   08/10/20 0000  Discharge wound care:       Comments: Continue wound care at nursing home.   08/10/20 1351            Follow-up Information     Midge Minium, MD Follow up in 1 week(s).    Specialty: Family Medicine Contact information: [address] [place] [address] [phone]         Belva Crome, MD .   Specialty: Cardiology Contact information: [phone] [place] [address] [phone]                Allergies  Allergen Reactions   Influenza Vac Split [Influenza Virus Vaccine] Nausea And Vomiting    Consultations: None   Procedures/Studies: CT ABDOMEN PELVIS WO CONTRAST  Result Date: 07/31/2020 CLINICAL DATA:  Abdominal pain.  Recent recovery EXAM: CT ABDOMEN AND PELVIS WITHOUT CONTRAST TECHNIQUE: Multidetector CT imaging of the abdomen and pelvis was performed following the standard protocol without IV contrast. COMPARISON:  CT scan of the chest July 11, 2020. CT scan of the abdomen and pelvis January 21, 2016. FINDINGS: Lower chest: The previously identified right-sided pleural effusion has resolved. The left-sided pleural effusion with associated opacity persists. There is a moderate hiatal hernia. Coronary artery calcifications are noted. Cardiomegaly identified. No other abnormalities in the lower chest. Hepatobiliary: No focal liver abnormality is seen. No gallstones, gallbladder wall thickening, or biliary dilatation. Pancreas: Unremarkable. No pancreatic ductal dilatation or surrounding inflammatory changes. Spleen: Normal in size without focal abnormality. Adrenals/Urinary Tract: Adrenal glands are unremarkable. Kidneys are normal, without renal calculi, focal lesion, or hydronephrosis. Bladder is unremarkable. Stomach/Bowel: There is a moderate hiatal hernia. The stomach is otherwise unremarkable. The small bowel is nondistended with no evidence of obstruction. Moderate fecal loading throughout much of the colon. Scattered colonic diverticulosis without diverticulitis. The colon and appendix are otherwise normal. Vascular/Lymphatic: Calcified atherosclerosis in the nonaneurysmal abdominal aorta. No adenopathy.  Reproductive: Prostate is unremarkable. Other: No free air or free fluid. Musculoskeletal: The patient is status post vertebroplasty at T12 with significant loss of vertebral body height, unchanged since July 11, 2020. No acute bony abnormalities. IMPRESSION: 1. Left effusion with underlying opacity again identified. The right effusion and underlying opacity has resolved. 2. No cause for abdominal pain identified. 3. Coronary artery calcifications. Atherosclerotic change in the abdominal aorta. 4. Moderate hiatal hernia. Electronically Signed   By: Dorise Bullion III M.D   On: 07/31/2020 16:18   DG Chest 2 View  Result Date: 07/11/2020 CLINICAL DATA:  Four weeks of lower extremity swelling and edema. Shortness of breath on exertion. EXAM: CHEST - 2 VIEW COMPARISON:  CT abdomen January 21, 2016. FINDINGS: The heart size and mediastinal  contours are partially obscured. Aortic atherosclerosis. Moderate hiatal hernia. Moderate left pleural effusion with adjacent parenchymal consolidation versus atelectasis. Right lung is predominantly clear. Prior lower thoracic vertebral augmentation. IMPRESSION: Moderate left pleural effusion with adjacent parenchymal consolidation versus atelectasis. Electronically Signed   By: Dahlia Bailiff MD   On: 07/11/2020 16:16   CT HEAD WO CONTRAST (5MM)  Result Date: 08/08/2020 CLINICAL DATA:  Nystagmus EXAM: CT HEAD WITHOUT CONTRAST TECHNIQUE: Contiguous axial images were obtained from the base of the skull through the vertex without intravenous contrast. COMPARISON:  07/31/2020 FINDINGS: Brain: Normal anatomic configuration. Moderate parenchymal volume loss is commensurate with the patient's age. Mild periventricular white matter changes are present likely reflecting the sequela of small vessel ischemia. No abnormal intra or extra-axial mass lesion or fluid collection. No abnormal mass effect or midline shift. No evidence of acute intracranial hemorrhage or infarct. Borderline  ventriculomegaly is commensurate with the degree of parenchymal volume loss and likely reflects ex vacuo dilation. Cerebellum unremarkable. Vascular: No asymmetric hyperdense vasculature at the skull base. Extensive atherosclerotic calcification within the carotid siphons. Skull: Intact Sinuses/Orbits: Paranasal sinuses are clear. Orbits are unremarkable. Other: Mastoid air cells and middle ear cavities are clear. IMPRESSION: No acute intracranial abnormality.  Stable senescent change. Electronically Signed   By: Fidela Salisbury MD   On: 08/08/2020 16:36   CT Head Wo Contrast  Result Date: 07/31/2020 CLINICAL DATA:  Mental status change. EXAM: CT HEAD WITHOUT CONTRAST TECHNIQUE: Contiguous axial images were obtained from the base of the skull through the vertex without intravenous contrast. COMPARISON:  None. FINDINGS: Brain: Ventricles and sulci are mildly prominent compatible with atrophy. Periventricular and subcortical white matter hypodensity compatible with chronic microvascular ischemic changes. No evidence for acute cortically based infarct, intracranial hemorrhage, mass lesion or mass-effect. Vascular: Unremarkable Skull: Intact. Sinuses/Orbits: Mild mucosal thickening ethmoid air cells. Remainder the paranasal sinuses unremarkable. Mastoid air cells unremarkable. Orbits unremarkable. Other: None. IMPRESSION: No acute intracranial process. Electronically Signed   By: Lovey Newcomer M.D.   On: 07/31/2020 12:31   CT Angio Chest PE W and/or Wo Contrast  Result Date: 07/11/2020 CLINICAL DATA:  Increasing shortness of breath EXAM: CT ANGIOGRAPHY CHEST WITH CONTRAST TECHNIQUE: Multidetector CT imaging of the chest was performed using the standard protocol during bolus administration of intravenous contrast. Multiplanar CT image reconstructions and MIPs were obtained to evaluate the vascular anatomy. CONTRAST:  78m OMNIPAQUE IOHEXOL 350 MG/ML SOLN COMPARISON:  Chest x-ray from earlier in the same day.  FINDINGS: Cardiovascular: Thoracic aorta demonstrates atherosclerotic calcifications without aneurysmal dilatation. Coronary calcifications are seen. Mild cardiac enlargement is noted. Pulmonary artery is well visualized bilaterally within normal branching pattern. No intraluminal filling defect is identified to suggest pulmonary embolism. Mediastinum/Nodes: Thoracic inlet is within normal limits. No sizable hilar or mediastinal adenopathy is noted. The esophagus as visualized is within normal limits with the exception of a sliding-type hiatal hernia. Lungs/Pleura: Bilateral pleural effusions are noted, left considerably greater than right. Right lung demonstrates basilar lower lobe atelectatic changes. More marked consolidation is noted in the left lower lobe and lingula. No discrete mass is noted on this exam. No sizable parenchymal nodule is seen. Upper Abdomen: Visualized upper abdomen is within normal limits. Musculoskeletal: Degenerative changes of the thoracic spine are noted. No acute rib abnormality is noted. Compression deformity with evidence of prior vertebral augmentation is seen at T12. Mild upper compression deformities are noted which appear chronic in nature. Review of the MIP images confirms the above findings. IMPRESSION:  Bilateral pleural effusions left greater than right with associated consolidation. Hiatal hernia. No evidence of pulmonary emboli Aortic Atherosclerosis (ICD10-I70.0). Electronically Signed   By: Inez Catalina M.D.   On: 07/11/2020 19:42   MR BRAIN WO CONTRAST  Result Date: 07/31/2020 CLINICAL DATA:  Stroke suspected. EXAM: MRI HEAD WITHOUT CONTRAST TECHNIQUE: Multiplanar, multiecho pulse sequences of the brain and surrounding structures were obtained without intravenous contrast. COMPARISON:  Head CT 07/31/2020. FINDINGS: Brain: Mild-to-moderate generalized cerebral atrophy. Commensurate prominence of the ventricles and sulci. Comparatively mild cerebellar atrophy. There are  a few small scattered foci of T2/FLAIR hyperintensity within the cerebral white matter, nonspecific but compatible with minimal changes of chronic small vessel ischemia. There is no acute infarct. No evidence of an intracranial mass. No chronic intracranial blood products. No extra-axial fluid collection. No midline shift. Vascular: Expected proximal arterial flow voids. Skull and upper cervical spine: No focal marrow lesion Sinuses/Orbits: Visualized orbits show no acute finding. Left lens replacement. IMPRESSION: No evidence of acute intracranial abnormality. Minimal chronic small-vessel ischemic changes within the cerebral white matter. Mild-to-moderate generalized cerebral atrophy. Comparatively mild cerebellar atrophy. Electronically Signed   By: Kellie Simmering DO   On: 07/31/2020 19:28   DG Chest Portable 1 View  Result Date: 07/31/2020 CLINICAL DATA:  Shortness of breath.  Lethargy and weakness. EXAM: PORTABLE CHEST 1 VIEW COMPARISON:  07/11/2020 FINDINGS: Small to moderate-sized left pleural effusion, decreased. Patchy and linear density in the aerated portion of the left mid and lower lung zone. Minimal linear atelectasis or scarring at the right lung base. No visible right pleural fluid. Grossly stable borderline enlarged cardiac silhouette and tortuous calcified thoracic aorta. Thoracic spine degenerative changes. IMPRESSION: Decreased left pleural fluid with persistent atelectasis/pneumonia in the left mid and lower lung zones. Electronically Signed   By: Claudie Revering M.D.   On: 07/31/2020 13:38   ECHOCARDIOGRAM COMPLETE  Result Date: 07/12/2020    ECHOCARDIOGRAM REPORT   Patient Name:   LINDAN BROTHERS Date of Exam: 07/12/2020 Medical Rec #:  [id number]      Height:       71.0 in Accession #:    [id number]     Weight:       233.7 lb Date of Birth:  June 21, 1931      BSA:          2.253 m Patient Age:    49 years       BP:           133/56 mmHg Patient Gender: M              HR:           75 bpm. Exam  Location:  Inpatient Procedure: 2D Echo, Cardiac Doppler and Color Doppler Indications:    Dyspnea R06.00  History:        Patient has prior history of Echocardiogram examinations, most                 recent 11/12/2018. Arrythmias:Atrial Fibrillation; Risk                 Factors:Diabetes, Dyslipidemia and Hypertension.  Sonographer:    Bernadene Person RDCS Referring Phys: [place]  1. Left ventricular ejection fraction, by estimation, is 60 to 65%. The left ventricle has normal function. The left ventricle has no regional wall motion abnormalities. Diastolic function indeterminant due to Afib.  2. Right ventricular systolic function is normal. The right ventricular size is normal. There is  moderately elevated pulmonary artery systolic pressure. The estimated right ventricular systolic pressure is 123456 mmHg.  3. Left atrial size was severely dilated.  4. Right atrial size was mildly dilated.  5. Moderate pleural effusion in the left lateral region.  6. The mitral valve is grossly normal. Mild mitral valve regurgitation.  7. The aortic valve is tricuspid. There is mild calcification of the aortic valve. There is moderate thickening of the aortic valve. Aortic valve regurgitation is mild to moderate. Mild to moderate aortic valve sclerosis/calcification is present, without any evidence of aortic stenosis.  8. Aortic dilatation noted. There is borderline dilatation of the aortic root, measuring 37 mm. There is moderate dilatation of the ascending aorta, measuring 40 mm.  9. The inferior vena cava is dilated in size with <50% respiratory variability, suggesting right atrial pressure of 15 mmHg. Comparison(s): Compared to prior TTE in 11/2018, a left sided moderate pleural effusion is now present. FINDINGS  Left Ventricle: Left ventricular ejection fraction, by estimation, is 60 to 65%. The left ventricle has normal function. The left ventricle has no regional wall motion abnormalities. The  left ventricular internal cavity size was normal in size. There is  no left ventricular hypertrophy. Diastolic function indeterminant due to Afib. Right Ventricle: The right ventricular size is normal. No increase in right ventricular wall thickness. Right ventricular systolic function is normal. There is moderately elevated pulmonary artery systolic pressure. The tricuspid regurgitant velocity is 2.92 m/s, and with an assumed right atrial pressure of 15 mmHg, the estimated right ventricular systolic pressure is 123456 mmHg. Left Atrium: Left atrial size was severely dilated. Right Atrium: Right atrial size was mildly dilated. Pericardium: Trivial pericardial effusion is present. Mitral Valve: The mitral valve is grossly normal. There is mild thickening of the mitral valve leaflet(s). There is mild calcification of the mitral valve leaflet(s). Mild to moderate mitral annular calcification. Mild mitral valve regurgitation. Tricuspid Valve: The tricuspid valve is normal in structure. Tricuspid valve regurgitation is mild. Aortic Valve: The aortic valve is tricuspid. There is mild calcification of the aortic valve. There is moderate thickening of the aortic valve. Aortic valve regurgitation is mild to moderate. Aortic regurgitation PHT measures 351 msec. Mild to moderate aortic valve sclerosis/calcification is present, without any evidence of aortic stenosis. Aortic valve mean gradient measures 11.7 mmHg. Aortic valve peak gradient measures 19.9 mmHg. Aortic valve area, by VTI measures 2.49 cm. Pulmonic Valve: The pulmonic valve was normal in structure. Pulmonic valve regurgitation is trivial. Aorta: Aortic dilatation noted. There is borderline dilatation of the aortic root, measuring 37 mm. There is moderate dilatation of the ascending aorta, measuring 40 mm. Venous: The inferior vena cava is dilated in size with less than 50% respiratory variability, suggesting right atrial pressure of 15 mmHg. IAS/Shunts: No atrial  level shunt detected by color flow Doppler. Additional Comments: There is a moderate pleural effusion in the left lateral region.  LEFT VENTRICLE PLAX 2D LVIDd:         4.60 cm LVIDs:         2.90 cm LV PW:         2.50 cm LV IVS:        1.00 cm LVOT diam:     2.10 cm LV SV:         120 LV SV Index:   53 LVOT Area:     3.46 cm  RIGHT VENTRICLE RV S prime:     12.30 cm/s TAPSE (M-mode): 2.1 cm LEFT ATRIUM  Index       RIGHT ATRIUM           Index LA diam:        6.20 cm  2.75 cm/m  RA Area:     26.40 cm LA Vol (A2C):   175.0 ml 77.68 ml/m RA Volume:   79.10 ml  35.11 ml/m LA Vol (A4C):   171.0 ml 75.91 ml/m LA Biplane Vol: 178.0 ml 79.01 ml/m  AORTIC VALVE AV Area (Vmax):    2.30 cm AV Area (Vmean):   2.20 cm AV Area (VTI):     2.49 cm AV Vmax:           223.00 cm/s AV Vmean:          158.667 cm/s AV VTI:            0.483 m AV Peak Grad:      19.9 mmHg AV Mean Grad:      11.7 mmHg LVOT Vmax:         148.00 cm/s LVOT Vmean:        101.000 cm/s LVOT VTI:          0.347 m LVOT/AV VTI ratio: 0.72 AI PHT:            351 msec  AORTA Ao Root diam: 3.70 cm TRICUSPID VALVE TR Peak grad:   34.1 mmHg TR Vmax:        292.00 cm/s  SHUNTS Systemic VTI:  0.35 m Systemic Diam: 2.10 cm Gwyndolyn Kaufman MD Electronically signed by Gwyndolyn Kaufman MD Signature Date/Time: 07/12/2020/4:03:37 PM    Final    CT EXTREMITY LOWER RIGHT WO CONTRAST  Result Date: 07/12/2020 CLINICAL DATA:  Soft tissue mass of thigh.  Spontaneous hemorrhage EXAM: CT OF THE LOWER RIGHT EXTREMITY WITHOUT CONTRAST TECHNIQUE: Multidetector CT imaging of the right lower extremity was performed according to the standard protocol. COMPARISON:  None. FINDINGS: Bones/Joint/Cartilage Negative for fracture, erosion, or bone lesion Ligaments Suboptimally assessed by CT. Muscles and Tendons Heterogeneously high-density mass in the medial compartment thigh musculature, elongated along muscle fibers and measuring 8 cm in length by approximately 3.3  cm in diameter. Soft tissues Marked generalized subcutaneous edema which is symmetric in the visible lower extremities and especially marked in the scrotum. No soft tissue gas or subcutaneous mass/hematoma. IMPRESSION: 1. High-density mass in the medial compartment right thigh that is likely hematoma, ~8 x 3 cm. The location is deep for visible bruising and the CT appearance is nonspecific, consider follow-up in few months to ensure resolution and exclude a mass. 2. Marked anasarca. Electronically Signed   By: Monte Fantasia M.D.   On: 07/12/2020 04:24   VAS [place] LOWER EXTREMITY VENOUS (DVT) ([facility] and [facility] 7a-7p)  Result Date: 07/12/2020  Lower Venous DVT Study Patient Name:  ZANIEL KOSAR  Date of Exam:   07/11/2020 Medical Rec #: [id number]       Accession #:    [id number] Date of Birth: 1931/06/04       Patient Gender: M Patient Age:   89Y Exam Location:  [facility] Procedure:      VAS [place] LOWER EXTREMITY VENOUS (DVT) Referring Phys: [id number] [place] --------------------------------------------------------------------------------  Indications: Swelling, Edema, and Pain.  Limitations: Poor ultrasound/tissue interface. Comparison Study: no prior Performing Technologist: Archie Patten RVS  Examination Guidelines: A complete evaluation includes B-mode imaging, spectral Doppler, color Doppler, and power Doppler as needed of all accessible portions of each vessel. Bilateral testing is considered  an integral part of a complete examination. Limited examinations for reoccurring indications may be performed as noted. The reflux portion of the exam is performed with the patient in reverse Trendelenburg.  +---------+---------------+---------+-----------+----------+-------------------+ RIGHT    CompressibilityPhasicitySpontaneityPropertiesThrombus Aging      +---------+---------------+---------+-----------+----------+-------------------+ CFV      Full           Yes      Yes                                       +---------+---------------+---------+-----------+----------+-------------------+ SFJ      Full                                                             +---------+---------------+---------+-----------+----------+-------------------+ FV Prox  Full                                                             +---------+---------------+---------+-----------+----------+-------------------+ FV Mid                  Yes      Yes                                      +---------+---------------+---------+-----------+----------+-------------------+ FV Distal               Yes      Yes                                      +---------+---------------+---------+-----------+----------+-------------------+ PFV      Full                                                             +---------+---------------+---------+-----------+----------+-------------------+ POP      Full           Yes      Yes                                      +---------+---------------+---------+-----------+----------+-------------------+ PTV      Full                                                             +---------+---------------+---------+-----------+----------+-------------------+ PERO  Not well visualized +---------+---------------+---------+-----------+----------+-------------------+   +---------+---------------+---------+-----------+----------+-------------------+ LEFT     CompressibilityPhasicitySpontaneityPropertiesThrombus Aging      +---------+---------------+---------+-----------+----------+-------------------+ CFV      Full           Yes      Yes                                      +---------+---------------+---------+-----------+----------+-------------------+ SFJ      Full                                                              +---------+---------------+---------+-----------+----------+-------------------+ FV Prox  Full                                                             +---------+---------------+---------+-----------+----------+-------------------+ FV Mid   Full                                                             +---------+---------------+---------+-----------+----------+-------------------+ FV DistalFull                                                             +---------+---------------+---------+-----------+----------+-------------------+ PFV      Full                                                             +---------+---------------+---------+-----------+----------+-------------------+ POP      Full           Yes      Yes                                      +---------+---------------+---------+-----------+----------+-------------------+ PTV      Full                                                             +---------+---------------+---------+-----------+----------+-------------------+ PERO                                                  Not well visualized +---------+---------------+---------+-----------+----------+-------------------+  Summary: RIGHT: - There is no evidence of deep vein thrombosis in the lower extremity. However, portions of this examination were limited- see technologist comments above.  - No cystic structure found in the popliteal fossa.  LEFT: - There is no evidence of deep vein thrombosis in the lower extremity. However, portions of this examination were limited- see technologist comments above.  - No cystic structure found in the popliteal fossa.  *See table(s) above for measurements and observations. Electronically signed by Monica Martinez MD on 07/12/2020 at 10:25:31 AM.    Final       Subjective: Patient was seen and examined at bedside.  Overnight events noted.  Patient reports feeling much improved.  Patient has  ambulated in the hallway with some unsteadiness but feels better.  He is not hypoxic not requiring oxygen.  Patient is being discharged to skilled nursing facility for rehab  Discharge Exam: Vitals:   08/10/20 0800 08/10/20 1312  BP: (!) 118/99 (!) 150/75  Pulse: 100 96  Resp: 18   Temp: 97.7 F (36.5 C) 97.7 F (36.5 C)  SpO2: 93%    Vitals:   08/09/20 2101 08/10/20 0452 08/10/20 0800 08/10/20 1312  BP:  (!) 152/63 (!) 118/99 (!) 150/75  Pulse:  99 100 96  Resp:  18 18   Temp: 98 F (36.7 C) 97.7 F (36.5 C) 97.7 F (36.5 C) 97.7 F (36.5 C)  TempSrc:  Oral Oral Oral  SpO2:   93%   Weight:      Height:        General: Pt is alert, awake, not in acute distress Cardiovascular: RRR, S1/S2 +, no rubs, no gallops Respiratory: CTA bilaterally, no wheezing, no rhonchi Abdominal: Soft, NT, ND, bowel sounds + Extremities: no edema, no cyanosis    The results of significant diagnostics from this hospitalization (including imaging, microbiology, ancillary and laboratory) are listed below for reference.     Microbiology: No results found for this or any previous visit (from the past 240 hour(s)).   Labs: BNP (last 3 results) Recent Labs    07/11/20 1556 07/31/20 1251  BNP 420.8* 123XX123*   Basic Metabolic Panel: Recent Labs  Lab 08/05/20 0908 08/06/20 0250 08/08/20 0216 08/09/20 0057 08/10/20 0140  NA 141 138 138 136 135  K 4.2 4.1 4.6 4.7 4.4  CL 108 107 107 106 105  CO2 '25 25 24 24 24  '$ GLUCOSE 91 94 108* 108* 106*  BUN '22 20 20 19 19  '$ CREATININE 1.61* 1.43* 1.50* 1.59* 1.35*  CALCIUM 8.6* 8.7* 8.6* 8.7* 8.7*  MG  --   --  1.8  --   --   PHOS  --   --  3.2  --   --    Liver Function Tests: No results for input(s): AST, ALT, ALKPHOS, BILITOT, PROT, ALBUMIN in the last 168 hours. No results for input(s): LIPASE, AMYLASE in the last 168 hours. No results for input(s): AMMONIA in the last 168 hours. CBC: Recent Labs  Lab 08/08/20 0216  WBC 7.1  HGB 10.1*   HCT 32.1*  MCV 98.8  PLT 217   Cardiac Enzymes: No results for input(s): CKTOTAL, CKMB, CKMBINDEX, TROPONINI in the last 168 hours. BNP: Invalid input(s): POCBNP CBG: No results for input(s): GLUCAP in the last 168 hours. D-Dimer No results for input(s): DDIMER in the last 72 hours. Hgb A1c No results for input(s): HGBA1C in the last 72 hours. Lipid Profile No results for input(s): CHOL, HDL, LDLCALC, TRIG,  CHOLHDL, LDLDIRECT in the last 72 hours. Thyroid function studies No results for input(s): TSH, T4TOTAL, T3FREE, THYROIDAB in the last 72 hours.  Invalid input(s): FREET3 Anemia work up No results for input(s): VITAMINB12, FOLATE, FERRITIN, TIBC, IRON, RETICCTPCT in the last 72 hours. Urinalysis    Component Value Date/Time   COLORURINE YELLOW 07/31/2020 1421   APPEARANCEUR CLEAR 07/31/2020 1421   LABSPEC 1.009 07/31/2020 1421   PHURINE 7.0 07/31/2020 1421   GLUCOSEU NEGATIVE 07/31/2020 1421   HGBUR NEGATIVE 07/31/2020 1421   BILIRUBINUR NEGATIVE 07/31/2020 1421   KETONESUR NEGATIVE 07/31/2020 1421   PROTEINUR NEGATIVE 07/31/2020 1421   NITRITE NEGATIVE 07/31/2020 1421   LEUKOCYTESUR NEGATIVE 07/31/2020 1421   Sepsis Labs Invalid input(s): PROCALCITONIN,  WBC,  LACTICIDVEN Microbiology No results found for this or any previous visit (from the past 240 hour(s)).   Time coordinating discharge: Over 30 minutes  SIGNED:   Shawna Clamp, MD  [facility] 08/10/2020, 2:59 PM Pager   If 7PM-7AM, please contact night-coverage

## 2020-08-10 NOTE — Progress Notes (Signed)
Physical Therapy Treatment Patient Details Name: [name] MRN: [id number] DOB: 02/17/1931 Today's Date: 08/10/2020    History of Present Illness Pt adm 7/30 with weakness, lethargy, bradycardia and AKI. Pt tested positive for covid on 07/25/20. Pt with recent adm to [facility] 7/10-7/20 for acute on chronic diastolic hear failure. Also with rt thigh hematoma while there. Pt suffered a fall 8/6 - head CT negative.  PMH - chf, afib, ckd, DM, HTN, gout    PT Comments    Pt with significant decline in mobility since last seen. In the interim pt had fall and now is fearful of falling and has a heavy posterior lean. Continue to recommend SNF at DC.    Follow Up Recommendations  SNF     Equipment Recommendations  None recommended by PT    Recommendations for Other Services       Precautions / Restrictions Precautions Precautions: Fall Restrictions Weight Bearing Restrictions: No    Mobility  Bed Mobility   Bed Mobility: Supine to Sit;Sit to Supine     Supine to sit: Supervision Sit to supine: Supervision   General bed mobility comments: HOB elevated. Assist for safety    Transfers Overall transfer level: Needs assistance Equipment used: Rolling walker (2 wheeled) Transfers: Sit to/from [facility] Sit to Stand: Min assist Stand pivot transfers: Mod assist;Max assist       General transfer comment: Pt rose from the bed with min assist due to posterior lean. As pt began bed to bsc transfer he was leaning heavily posteior and required mod to max assist to prevent fall. When attempting to sit on [facility] pt keeping body rigid and leaning posteriorly instead of bending knees to sit. Finally able to get pt to reach back for armrest of bsc and flex his hips and knees to sit. After extended time on [facility] pt assisted back to bed with assist of nurse tech and still with posterior lean but not as severe.  Ambulation/Gait             General Gait Details:  Unable   Marine scientist [place] (Stroke Patients Only)       Balance Overall balance assessment: Needs assistance Sitting-balance support: No upper extremity supported;Feet supported Sitting balance-Leahy Scale: Good     Standing balance support: Bilateral upper extremity supported Standing balance-Leahy Scale: Poor Standing balance comment: posterior lean                            Cognition Arousal/Alertness: Awake/alert Behavior During Therapy: WFL for tasks assessed/performed Overall Cognitive Status: Within Functional Limits for tasks assessed                                        Exercises      General Comments        Pertinent Vitals/Pain Pain Assessment: No/denies pain    Home Living                      Prior Function            PT Goals (current goals can now be found in the care plan section) Progress towards PT goals: Not progressing toward goals - comment    Frequency  Min 2X/week      PT Plan Current plan remains appropriate    Co-evaluation              AM-PAC PT "6 Clicks" Mobility   Outcome Measure  Help needed turning from your back to your side while in a flat bed without using bedrails?: None Help needed moving from lying on your back to sitting on the side of a flat bed without using bedrails?: A Little Help needed moving to and from a bed to a chair (including a wheelchair)?: A Lot Help needed standing up from a chair using your arms (e.g., wheelchair or bedside chair)?: A Little Help needed to walk in hospital room?: Total Help needed climbing 3-5 steps with a railing? : Total 6 Click Score: 14    End of Session Equipment Utilized During Treatment: Gait belt Activity Tolerance: Patient tolerated treatment well Patient left: in bed;with call bell/phone within reach;with nursing/sitter in room Nurse Communication: Mobility status (nurse  tech present and assisting) PT Visit Diagnosis: Muscle weakness (generalized) (M62.81);History of falling (Z91.81);Unsteadiness on feet (R26.81)     Time: 1230-1305 PT Time Calculation (min) (ACUTE ONLY): 35 min  Charges:                        [place] Pager [phone] Office [place] 08/10/2020, 1:50 PM

## 2020-08-10 NOTE — TOC Progression Note (Signed)
Transition of Care [facility]) - Progression Note    Patient Details  Name: [name] MRN: [id number] Date of Birth: April 11, 1931  Transition of Care [facility]) CM/SW [place], [place] Phone Number: 08/10/2020, 1:38 PM  Clinical Narrative:     CSW received call from [place] with [facility] that they received insurance authorization for patient. CSW informed MD. No covid is needed for patient to go to SNF Per Kitty. CSW will continue to follow and assist with dc planning needs.  Expected Discharge Plan: Skilled Nursing Facility Barriers to Discharge: Continued Medical Work up  Expected Discharge Plan and Services Expected Discharge Plan: [place] In-house Referral: Clinical Social Work     Living arrangements for the past 2 months: Single Family Home (from home with spouse)                                       Social Determinants of Health (SDOH) Interventions    Readmission Risk Interventions Readmission Risk Prevention Plan 07/12/2020  Transportation Screening Complete  PCP or Specialist Appt within 5-7 Days Complete  Home Care Screening Complete  Medication Review (RN CM) Complete  Some recent data might be hidden

## 2020-08-10 NOTE — Care Management Important Message (Signed)
Important Message  Patient Details  Name: LOR LAMERE MRN: [id number] Date of Birth: 1931/02/18   Medicare Important Message Given:  Yes     Hardik, Farinha 08/10/2020, 10:11 AM

## 2020-08-12 ENCOUNTER — Encounter: Payer: Self-pay | Admitting: Internal Medicine

## 2020-08-12 ENCOUNTER — Non-Acute Institutional Stay (SKILLED_NURSING_FACILITY): Payer: Medicare HMO | Admitting: Internal Medicine

## 2020-08-12 DIAGNOSIS — E872 Acidosis, unspecified: Secondary | ICD-10-CM | POA: Insufficient documentation

## 2020-08-12 DIAGNOSIS — N179 Acute kidney failure, unspecified: Secondary | ICD-10-CM

## 2020-08-12 DIAGNOSIS — I509 Heart failure, unspecified: Secondary | ICD-10-CM | POA: Diagnosis not present

## 2020-08-12 DIAGNOSIS — U071 COVID-19: Secondary | ICD-10-CM | POA: Diagnosis not present

## 2020-08-12 DIAGNOSIS — R7303 Prediabetes: Secondary | ICD-10-CM

## 2020-08-12 DIAGNOSIS — I5033 Acute on chronic diastolic (congestive) heart failure: Secondary | ICD-10-CM

## 2020-08-12 NOTE — Assessment & Plan Note (Addendum)
07/31/2020 chest x-ray revealed decrease in the left pleural effusion but persistent atelectasis vs pneumonia in the mid and lower lung zones.  This is in the context of positive COVID PCR for which he received remdesivir.  No antibiotics administered. Clinically residual effusion rather than PNA suggested today.

## 2020-08-12 NOTE — Progress Notes (Signed)
NURSING HOME LOCATION:  [facility]  Skilled Nursing Facility ROOM NUMBER:  [id number]  CODE STATUS:    PCP:  [name] MD  This is a comprehensive admission note to this SNFperformed on this date less than 30 days from date of admission. Included are preadmission medical/surgical history; reconciled medication list; family history; social history and comprehensive review of systems.  Corrections and additions to the records were documented. Comprehensive physical exam was also performed. Additionally a clinical summary was entered for each active diagnosis pertinent to this admission in the Problem List to enhance continuity of care.  HPI: He was hospitalized 7/30 - 08/10/2020 for progressive weakness.  This was actually a rehospitalization as he had been hospitalized 7/10-7/20 for acute on chronic diastolic congestive heart failure and right thigh hematoma in the context of warfarin therapy. After the first discharge he was noted to be progressively more weak with difficulty arising & with intermittent confusion.  COVID testing was +7/24. In transport EMS reported heart rates in the 20s on the cardiac monitor but this was not captured . AKI superimposed on CKD stage III was documented. He exhibited orthostatic hypotension which subsequently improved with IV rehydration.  His profound weakness was felt to be related to dehydration and lactic acidosis which did resolve. He did fall twice while in the hospital.  CT imaging was negative for any acute stroke or injury.  MRI also revealed no stroke. He remained in A. fib with controlled heart rates in the 60s.  Eliquis was continued and metoprolol which had been initially held was resumed at a lower dose. D-dimer peaked on 7/31 with a value of 2.94; as noted he is on Eliquis.  7/10 chest x-ray revealed decrease in left pleural effusion but persistent atelectasis/possible pneumonia in the mid and lower lung zones.  There is no mention of this in the  discharge summary and no evidence that he received any antibiotics.  C-reactive protein was only minimally elevated at 0.6. He received Remdesivir X 3 days. He was isolated while in the hospital as per protocol. He was discharged to the SNF for rehab.  Past medical and surgical history: Includes chronic anemia, aortic insufficiency, chronic A. fib, chronic diastolic congestive heart failure, chronic venous insufficiency, diabetes with CKD stage III, history of gout, dyslipidemia, essential hypertension, and history of vitamin B12 deficiency. Surgeries and procedures include colonoscopy.  Social history: Nondrinker; former smoker.  Family history: Noncontributory due to advanced age.   Review of systems: He gave the correct month and year but seemed to have difficulty explaining why he had been in the hospital. He rambled on about having  "too much fluid and swelling, but then they took too much off & got weak" .He talks about being "left standing by them" and losing his balance and falling, striking his head. He validates some cough with minimal sputum and some intermittent shortness of breath.  The shortness of breath is present when he turns over in bed or attempts to arise.  He denies any frank paroxysmal nocturnal dyspnea. Constipation is a chronic issue related to his bowel conditions.  Constitutional: No fever, significant weight change Eyes: No redness, discharge, pain, vision change ENT/mouth: No nasal congestion, purulent discharge, earache, change in hearing, sore throat  Respiratory: No hemoptysis, significant snoring, apnea Gastrointestinal: No heartburn, dysphagia, abdominal pain, nausea /vomiting, rectal bleeding, melena, new change in bowels Genitourinary: No dysuria, hematuria, pyuria, nocturia Dermatologic: No rash, pruritus, change in appearance of skin Neurologic: No headache, syncope, seizures  Endocrine: No change in hair/skin/nails, excessive thirst, excessive hunger,  excessive urination  Hematologic/lymphatic: No significant bruising, lymphadenopathy, abnormal bleeding  Physical exam:  Pertinent or positive findings: He appears somewhat chronically ill and appears his stated age.  Hair is disheveled.  He has pattern alopecia.  He has bilateral ptosis.  There is suggestion of possible minimal anisocoria with the right pupil greater than the left ,but this is extremely subtle.  He has an upper plate.  The mandibular dental work is immaculate.  Heart rhythm is irregular and a flow murmur is present.  Chest is essentially clear except for absence of breath sounds in the left lower lobe.  He has 1/2+ pitting edema.  Pedal pulses are decreased.  General appearance: Adequately nourished; no acute distress, increased work of breathing is present.   Lymphatic: No lymphadenopathy about the head, neck, axilla. Eyes: No conjunctival inflammation or lid edema is present. There is no scleral icterus. Ears:  External ear exam shows no significant lesions or deformities.   Nose:  External nasal examination shows no deformity or inflammation. Nasal mucosa are pink and moist without lesions, exudates Neck:  No thyromegaly, masses, tenderness noted.    Heart:  No gallop, murmur, click, rub.  Lungs: without wheezes, rhonchi, rales, rubs. Abdomen: Bowel sounds are normal.  Abdomen is soft and nontender with no organomegaly, hernias, masses. GU: Deferred  Extremities:  No cyanosis, clubbing Neurologic exam:  Balance, Rhomberg, finger to nose testing could not be completed due to clinical state Skin: Warm & dry w/o tenting. No significant lesions or rash.  See clinical summary under each active problem in the Problem List with associated updated therapeutic plan

## 2020-08-12 NOTE — Assessment & Plan Note (Signed)
Creatinine peaked @ 1.94 / GFR  Nadir was 33 ; CKD Stage 3 b Current creatinine 1.35 / GFR 51 ; CKD Stage 3 a Medication List reviewed. Allopurinol decreased to 150 mg qd If CKD progresses  Metformin will be weaned or discontinued.

## 2020-08-12 NOTE — Assessment & Plan Note (Signed)
Dehydration and lactic acidosis clinically resolved.

## 2020-08-12 NOTE — Patient Instructions (Signed)
See assessment and plan under each diagnosis in the problem list and acutely for this visit 

## 2020-08-12 NOTE — Assessment & Plan Note (Signed)
He remains weak; possible Covid " long hauler" status needs to be determined.  PT/OT at SNF.

## 2020-08-12 NOTE — Assessment & Plan Note (Addendum)
Consider BNP & repeat PCXR as cardiopulmonary symptoms & findings clinically warrant

## 2020-08-12 NOTE — Assessment & Plan Note (Addendum)
Diabetes without complications was listed in problem list.  His A1c on 7/10 was 5.6% which is prediabetic.  He does have CKD stage IIIa. Glucoses while hospitalized ranged from 89 up to 108.  Indication for present low dose Metformin can be reassessed by PCP.

## 2020-08-13 ENCOUNTER — Telehealth: Payer: Self-pay

## 2020-08-13 NOTE — Progress Notes (Signed)
Chronic Care Management Pharmacy Assistant   Name: [name]  MRN: [id number] DOB: 1931/02/14   Reason for Encounter: Disease State - Hypertension / Schedule CPP Follow up    *Upon chart review: Patient was admitted to skilled nursing facility post hospital discharge on 08/12/20.     Recent office visits:    Recent consult visits:    Hospital visits: 07/31/20 - 08/10/20  Medication Reconciliation was completed by comparing discharge summary, patient's EMR and Pharmacy list, and upon discussion with patient.  Admitted to the hospital on 07/31/20 due to [place] 19 Infection / Acute Kidney Injury Discharge date was 08/10/20. Discharged from [place]?Medications Started at [facility] Discharge:??   Medication Changes at Hospital Discharge:   Medications Discontinued at Hospital Discharge:   Medications that remain the same after Hospital Discharge:??  -All other medications will remain the same.     Hospital visits: 07/11/20 - 07/21/20  Medication Reconciliation was completed by comparing discharge summary, patient's EMR and Pharmacy list, and upon discussion with patient.  Admitted to the hospital on 07/11/20 due to Acute on Chronic Diastolic CHF. Discharge date was 07/21/20. Discharged from [place]?Medications Started at [facility] Discharge:??   Medication Changes at Hospital Discharge:   Medications Discontinued at Hospital Discharge:   Medications that remain the same after Hospital Discharge:??  -All other medications will remain the same.   Medications: Outpatient Encounter Medications as of 08/13/2020  Medication Sig Note   acetaminophen (TYLENOL) 325 MG tablet Take 650 mg by mouth every 6 (six) hours as needed.    allopurinol (ZYLOPRIM) 300 MG tablet TAKE 1 TABLET BY MOUTH EVERY DAY 08/12/2020: Decreased to 150 mg qd due to CKD Stage 3 a   apixaban (ELIQUIS) 2.5 MG TABS tablet Take 1 tablet (2.5 mg total) by mouth 2 (two)  times daily.    bisacodyl (DULCOLAX) 10 MG suppository If not relieved by MOM, give 10 mg Bisacodyl suppositiory rectally X 1 dose in 24 hours as needed    Calcium Carbonate (CALCIUM 600 PO) Give 2 tablets (1,'200mg'$ ) by mouth Daily for supplement    docusate sodium (COLACE) 100 MG capsule Take 100 mg by mouth daily.    feeding supplement (ENSURE ENLIVE / ENSURE PLUS) LIQD Take by mouth daily.    Ferrous Sulfate (IRON PO) Take 325 mg by mouth daily.    magnesium hydroxide (MILK OF MAGNESIA) 400 MG/5ML suspension If no BM in 3 days, give 30 cc Milk of Magnesium p.o. x 1 dose in 24 hours as needed (Do not use standing constipation orders for residents with renal failure CFR less than 30. Contact MD for orders)    metFORMIN (GLUCOPHAGE) 500 MG tablet TAKE 1 TABLET BY MOUTH EVERY DAY    metoprolol succinate (TOPROL-XL) 50 MG 24 hr tablet Take 0.5 tablets (25 mg total) by mouth daily. Take with or immediately following a meal.    NON FORMULARY Diet change to Regular    QUEtiapine (SEROQUEL) 25 MG tablet Take 0.5 tablets (12.5 mg total) by mouth at bedtime.    simvastatin (ZOCOR) 20 MG tablet TAKE 1 TABLET BY MOUTH EVERY DAY    Sodium Phosphates (RA SALINE ENEMA RE) Place rectally. If not relieved by Biscodyl suppository, give disposable Saline Enema rectally X 1 dose/24 hrs as needed    torsemide (DEMADEX) 20 MG tablet Take 20 mg by mouth daily.    vitamin B-12 (CYANOCOBALAMIN) 500 MCG tablet Take 500  mcg by mouth daily.    No facility-administered encounter medications on file as of 08/13/2020.    Current antihypertensive regimen:  Furosemide 20 mg tablet; 1-2 tablet daily Lisinopril 2.5 mg tablet daily Metoprolol Succinate 50 mg daily  How often are you checking your Blood Pressure?    Current home BP readings:    What recent interventions/DTPs have been made by any provider to improve Blood Pressure control since last CPP Visit:    Any recent hospitalizations or ED visits since last visit  with CPP?  Patient was hospitalized from 07/11/20 - 07/21/20 and 07/31/20 - 08/10/20.   What diet changes have been made to improve Blood Pressure Control?     What exercise is being done to improve your Blood Pressure Control?      Adherence Review: Is the patient currently on ACE/ARB medication? yes Does the patient have >5 day gap between last estimated fill dates? No  Furosemide 20 mg tablet; 1-2 tablet daily - last filled 06/12/20 90 days Lisinopril 2.5 mg tablet daily - last filled 06/28/20 90 days  Metoprolol Succinate 50 mg daily - last filled 05/22/20 90 days    Star Rating Drugs: Simvastatin (ZOCOR) 20 MG tablet - last filled 05/24/20 90 days  Lisinopril 2.5 mg tablet daily - last filled 06/28/20 90 days   Future Appointments  Date Time Provider [place]  08/16/2020  1:00 PM Valinda Party, DO [facility]  10/15/2020  2:00 PM Belva Crome, MD [facility]  12/14/2020 12:30 PM Midge Minium, MD [facility]    Patient was admitted to skilled nursing facility post hospital discharge on 08/12/20.    Jobe Gibbon, [place] Pharmacist Assistant  [phone]  Time Spent: 15 minutes

## 2020-08-16 ENCOUNTER — Encounter (HOSPITAL_BASED_OUTPATIENT_CLINIC_OR_DEPARTMENT_OTHER): Payer: Medicare HMO | Admitting: Internal Medicine

## 2020-08-16 ENCOUNTER — Encounter: Payer: Self-pay | Admitting: Adult Health

## 2020-08-16 ENCOUNTER — Non-Acute Institutional Stay (SKILLED_NURSING_FACILITY): Payer: Medicare HMO | Admitting: Adult Health

## 2020-08-16 DIAGNOSIS — K5901 Slow transit constipation: Secondary | ICD-10-CM | POA: Diagnosis not present

## 2020-08-16 DIAGNOSIS — R319 Hematuria, unspecified: Secondary | ICD-10-CM

## 2020-08-16 DIAGNOSIS — N179 Acute kidney failure, unspecified: Secondary | ICD-10-CM | POA: Diagnosis not present

## 2020-08-16 NOTE — Progress Notes (Addendum)
Location:  [place] Room Number: [place] of Service:  SNF (31) Provider:  Durenda Age, DNP, FNP-BC  Patient Care Team: Midge Minium, MD as PCP - General (Family Medicine) Belva Crome, MD as PCP - Cardiology (Cardiology) Elza Rafter, MD as Consulting Physician (Family Medicine) Armbruster, Carlota Raspberry, MD as Consulting Physician (Gastroenterology) [facility] (Internal Medicine) Madelin Rear, [facility] as Pharmacist (Pharmacist)  Extended Emergency Contact Information Primary Emergency Contact: Stafford,Debbie Address: [address]          [place], [place] [address] Johnnette Litter of [place] Phone: [phone] Relation: Daughter Secondary Emergency Contact: Tsuchiya,Dorothy Address: [address]          [place],  [address] Johnnette Litter of [place] Phone: [phone] Relation: Spouse  Code Status:  DNR  Goals of care: Advanced Directive information Advanced Directives 08/12/2020  Does Patient Have a Medical Advance Directive? Yes  Type of Paramedic of [place];Out of facility DNR (pink MOST or yellow form)  Does patient want to make changes to medical advance directive? No - Patient declined  Copy of [place] in Chart? Yes - validated most recent copy scanned in chart (See row information)  Would patient like information on creating a medical advance directive? -     Chief Complaint  Patient presents with   Acute Visit    Constipation, Blood in urine    HPI:  Pt is a 87 y.o. male seen today ffor constipation and hematuria. Staff reported that he has been poking his fingers in his rectum to stimulate bowel movement. He is currently taking Colace 100 mg daily for constipation. Daughter was visiting and stated that he had blood in his urine over the weekend. No noted bloody diaper today. Urinalysis was done yesterday which showed urine blood large, urine Positive, urine  RBC TNTC, urine bacteria moderate in urine leukocyte esterase trace.  Awaiting urine culture.  CBC showed hemoglobin of 10.3, platelet 177 and WBC 5.2.  Patient currently taking Eliquis 2.5 mg twice daily for atrial fibrillation.  No reported fever nor chills.     He was admitted to [facility] on 08/10/2020 post hospitalization 07/31/2020 to 08/10/20.  He has a PMH of chronic diastolic CHF, chronic kidney disease is stage IIIb with baseline creatinine 1.4-1.5, hypertension, hyperlipidemia, obesity, type 2 diabetes mellitus, chronic venous insufficiency and atrial fibrillation.  He was recently admitted to the hospital from 7/10-7/20 for acute on chronic diastolic CHF, right thigh hematoma while on Coumadin. He returned to the hospital on 07/31/20 due to progressive generalized weakness, lethargy.  He tested positive for COVID-19 on 07/25/2020.  No reported shortness of breath, hypoxia, fever and focal weakness.  EMS reported bradycardia to the 20s on the cardiac monitor but unable to capture.  He was treated for AKI on CKD stage III.  Serum creatinine back to baseline.  He was found to have orthostatic hypotension which improved with IV hydration.  She has fallen twice in the hospital.  CT head negative for any acute stroke.  MRI no evidence of stroke.  Metoprolol was resumed on lower dosing.  She was discharged on a low-dose torsemide.  He was [facility] to [facility] for a short-term rehabilitation.   Past Medical History:  Diagnosis Date   Abnormal finding on imaging of liver    Anemia    Aortic insufficiency    Blood transfusion without reported diagnosis    Chronic atrial fibrillation ([place])  Chronic constipation    colace helps   Chronic diastolic CHF (congestive heart failure) (HCC)    Chronic venous insufficiency    CKD (chronic kidney disease), stage III (HCC)    Diabetes mellitus (HCC)    Gout    Hyperlipidemia    Hypertension    Osteopenia    Stercoral ulcer of  rectum    Varicose veins of both lower extremities with complications    Vitamin B12 deficiency    Past Surgical History:  Procedure Laterality Date   BUNIONECTOMY  2006   COLONOSCOPY N/A 01/22/2016   Procedure: COLONOSCOPY;  Surgeon: Manus Gunning, MD;  Location: [facility];  Service: Gastroenterology;  Laterality: N/A;   HEMORRHOID SURGERY  1970   TONSILLECTOMY AND ADENOIDECTOMY  1945    Allergies  Allergen Reactions   Influenza Vac Split [Influenza Virus Vaccine] Nausea And Vomiting    Outpatient Encounter Medications as of 08/16/2020  Medication Sig   acetaminophen (TYLENOL) 325 MG tablet Take 650 mg by mouth every 6 (six) hours as needed.   allopurinol (ZYLOPRIM) 300 MG tablet TAKE 1 TABLET BY MOUTH EVERY DAY   apixaban (ELIQUIS) 2.5 MG TABS tablet Take 1 tablet (2.5 mg total) by mouth 2 (two) times daily.   bisacodyl (DULCOLAX) 10 MG suppository If not relieved by MOM, give 10 mg Bisacodyl suppositiory rectally X 1 dose in 24 hours as needed   Calcium Carbonate (CALCIUM 600 PO) Give 2 tablets (1,'200mg'$ ) by mouth Daily for supplement   docusate sodium (COLACE) 100 MG capsule Take 100 mg by mouth daily.   feeding supplement (ENSURE ENLIVE / ENSURE PLUS) LIQD Take by mouth daily.   Ferrous Sulfate (IRON PO) Take 325 mg by mouth daily.   magnesium hydroxide (MILK OF MAGNESIA) 400 MG/5ML suspension If no BM in 3 days, give 30 cc Milk of Magnesium p.o. x 1 dose in 24 hours as needed (Do not use standing constipation orders for residents with renal failure CFR less than 30. Contact MD for orders)   metFORMIN (GLUCOPHAGE) 500 MG tablet TAKE 1 TABLET BY MOUTH EVERY DAY   metoprolol succinate (TOPROL-XL) 50 MG 24 hr tablet Take 0.5 tablets (25 mg total) by mouth daily. Take with or immediately following a meal.   NON FORMULARY Diet change to Regular   QUEtiapine (SEROQUEL) 25 MG tablet Take 0.5 tablets (12.5 mg total) by mouth at bedtime.   simvastatin (ZOCOR) 20 MG tablet TAKE 1  TABLET BY MOUTH EVERY DAY   Sodium Phosphates (RA SALINE ENEMA RE) Place rectally. If not relieved by Biscodyl suppository, give disposable Saline Enema rectally X 1 dose/24 hrs as needed   torsemide (DEMADEX) 20 MG tablet Take 20 mg by mouth daily.   vitamin B-12 (CYANOCOBALAMIN) 500 MCG tablet Take 500 mcg by mouth daily.   No facility-administered encounter medications on file as of 08/16/2020.    Review of Systems  GENERAL: No change in appetite, no fatigue, no weight changes, no fever or chills  MOUTH and THROAT: Denies oral discomfort, gingival pain or bleeding, RESPIRATORY: no cough, SOB, DOE, wheezing, hemoptysis CARDIAC: No chest pain, edema or palpitations GI: + Constipation GU: + hematuria NEUROLOGICAL: Denies dizziness, syncope, numbness, or headache PSYCHIATRIC: Denies feelings of depression or anxiety. No report of hallucinations, insomnia, paranoia, or agitation   Immunization History  Administered Date(s) Administered   PFIZER(Purple Top)SARS-COV-2 Vaccination 03/25/2019, 04/22/2019   Pneumococcal Conjugate-13 02/26/2014   Pneumococcal Polysaccharide-23 01/02/2006, 12/20/2015   Td 01/03/2000   Tdap 02/26/2014  Pertinent  Health Maintenance Due  Topic Date Due   URINE MICROALBUMIN  08/28/2020 (Originally 02/27/2015)   HEMOGLOBIN A1C  01/11/2021   OPHTHALMOLOGY EXAM  05/12/2021   FOOT EXAM  06/18/2021   PNA vac Low Risk Adult  Completed   Fall Risk  07/28/2020 06/18/2020 07/08/2019 02/24/2019 11/14/2018  Falls in the past year? 1 0 0 0 0  Number falls in past yr: 1 0 0 0 0  Injury with Fall? 1 0 0 0 0  Comment - - - - -  Risk for fall due to : History of fall(s) No Fall Risks - - -  Follow up Falls evaluation completed - Falls prevention discussed Falls evaluation completed Falls evaluation completed     Vitals:   08/16/20 1601  Resp: 20  Temp: 98.1 F (36.7 C)  Weight: 200 lb 12.8 oz (91.1 kg)  Height: '5\' 10"'$  (1.778 m)   Body mass index is 28.81  kg/m.  Physical Exam  GENERAL APPEARANCE: Well nourished. In no acute distress. Normal body habitus SKIN:  Skin is warm and dry.  MOUTH and THROAT: Lips are without lesions. Oral mucosa is moist and without lesions.  RESPIRATORY: Breathing is even & unlabored, BS CTAB CARDIAC: RRR, no murmur,no extra heart sounds, no edema GI: Abdomen soft, normal BS, no masses, no tenderness EXTREMITIES: Able to move x4 extremities. NEUROLOGICAL: There is no tremor. Speech is clear. Alert and oriented X 3. PSYCHIATRIC:  Affect and behavior are appropriate  Labs reviewed: Recent Labs    07/12/20 0427 07/13/20 0437 07/16/20 0422 07/17/20 0504 07/18/20 0516 08/08/20 0216 08/09/20 0057 08/10/20 0140  NA 140   < > 135 138   < > 138 136 135  K 3.8   < > 3.8 4.4   < > 4.6 4.7 4.4  CL 104   < > 99 100   < > 107 106 105  CO2 28   < > 28 33*   < > '24 24 24  '$ GLUCOSE 106*   < > 92 100*   < > 108* 108* 106*  BUN 26*   < > 36* 40*   < > '20 19 19  '$ CREATININE 1.54*   < > 1.85* 2.02*   < > 1.50* 1.59* 1.35*  CALCIUM 8.7*   < > 8.7* 9.2   < > 8.6* 8.7* 8.7*  MG 2.2   < > 2.2 2.2  --  1.8  --   --   PHOS 2.7  --   --   --   --  3.2  --   --    < > = values in this interval not displayed.   Recent Labs    07/12/20 0427 07/31/20 1251 08/01/20 0438  AST '23 22 28  '$ ALT '11 11 10  '$ ALKPHOS 93 79 77  BILITOT 2.5* 1.7* 1.1  PROT 6.5 6.4* 6.2*  ALBUMIN 3.0* 2.7* 2.6*   Recent Labs    07/31/20 1251 08/01/20 0029 08/03/20 0224 08/08/20 0216  WBC 5.2 5.5 5.0 7.1  NEUTROABS 2.9 3.7 3.1  --   HGB 10.8* 10.2* 9.8* 10.1*  HCT 34.0* 32.1* 30.8* 32.1*  MCV 97.1 96.7 96.9 98.8  PLT 227 209 212 217   Lab Results  Component Value Date   TSH 1.434 07/31/2020   Lab Results  Component Value Date   HGBA1C 5.6 07/11/2020   Lab Results  Component Value Date   CHOL 96 06/18/2020   HDL 33.50 (L) 06/18/2020  LDLCALC 54 06/18/2020   TRIG 46.0 06/18/2020   CHOLHDL 3 06/18/2020    Significant Diagnostic  Results in last 30 days:  CT ABDOMEN PELVIS WO CONTRAST  Result Date: 07/31/2020 CLINICAL DATA:  Abdominal pain.  Recent recovery EXAM: CT ABDOMEN AND PELVIS WITHOUT CONTRAST TECHNIQUE: Multidetector CT imaging of the abdomen and pelvis was performed following the standard protocol without IV contrast. COMPARISON:  CT scan of the chest July 11, 2020. CT scan of the abdomen and pelvis January 21, 2016. FINDINGS: Lower chest: The previously identified right-sided pleural effusion has resolved. The left-sided pleural effusion with associated opacity persists. There is a moderate hiatal hernia. Coronary artery calcifications are noted. Cardiomegaly identified. No other abnormalities in the lower chest. Hepatobiliary: No focal liver abnormality is seen. No gallstones, gallbladder wall thickening, or biliary dilatation. Pancreas: Unremarkable. No pancreatic ductal dilatation or surrounding inflammatory changes. Spleen: Normal in size without focal abnormality. Adrenals/Urinary Tract: Adrenal glands are unremarkable. Kidneys are normal, without renal calculi, focal lesion, or hydronephrosis. Bladder is unremarkable. Stomach/Bowel: There is a moderate hiatal hernia. The stomach is otherwise unremarkable. The small bowel is nondistended with no evidence of obstruction. Moderate fecal loading throughout much of the colon. Scattered colonic diverticulosis without diverticulitis. The colon and appendix are otherwise normal. Vascular/Lymphatic: Calcified atherosclerosis in the nonaneurysmal abdominal aorta. No adenopathy. Reproductive: Prostate is unremarkable. Other: No free air or free fluid. Musculoskeletal: The patient is status post vertebroplasty at T12 with significant loss of vertebral body height, unchanged since July 11, 2020. No acute bony abnormalities. IMPRESSION: 1. Left effusion with underlying opacity again identified. The right effusion and underlying opacity has resolved. 2. No cause for abdominal pain  identified. 3. Coronary artery calcifications. Atherosclerotic change in the abdominal aorta. 4. Moderate hiatal hernia. Electronically Signed   By: Dorise Bullion III M.D   On: 07/31/2020 16:18   CT HEAD WO CONTRAST (5MM)  Result Date: 08/08/2020 CLINICAL DATA:  Nystagmus EXAM: CT HEAD WITHOUT CONTRAST TECHNIQUE: Contiguous axial images were obtained from the base of the skull through the vertex without intravenous contrast. COMPARISON:  07/31/2020 FINDINGS: Brain: Normal anatomic configuration. Moderate parenchymal volume loss is commensurate with the patient's age. Mild periventricular white matter changes are present likely reflecting the sequela of small vessel ischemia. No abnormal intra or extra-axial mass lesion or fluid collection. No abnormal mass effect or midline shift. No evidence of acute intracranial hemorrhage or infarct. Borderline ventriculomegaly is commensurate with the degree of parenchymal volume loss and likely reflects ex vacuo dilation. Cerebellum unremarkable. Vascular: No asymmetric hyperdense vasculature at the skull base. Extensive atherosclerotic calcification within the carotid siphons. Skull: Intact Sinuses/Orbits: Paranasal sinuses are clear. Orbits are unremarkable. Other: Mastoid air cells and middle ear cavities are clear. IMPRESSION: No acute intracranial abnormality.  Stable senescent change. Electronically Signed   By: Fidela Salisbury MD   On: 08/08/2020 16:36   CT Head Wo Contrast  Result Date: 07/31/2020 CLINICAL DATA:  Mental status change. EXAM: CT HEAD WITHOUT CONTRAST TECHNIQUE: Contiguous axial images were obtained from the base of the skull through the vertex without intravenous contrast. COMPARISON:  None. FINDINGS: Brain: Ventricles and sulci are mildly prominent compatible with atrophy. Periventricular and subcortical white matter hypodensity compatible with chronic microvascular ischemic changes. No evidence for acute cortically based infarct, intracranial  hemorrhage, mass lesion or mass-effect. Vascular: Unremarkable Skull: Intact. Sinuses/Orbits: Mild mucosal thickening ethmoid air cells. Remainder the paranasal sinuses unremarkable. Mastoid air cells unremarkable. Orbits unremarkable. Other: None. IMPRESSION: No acute intracranial  process. Electronically Signed   By: Lovey Newcomer M.D.   On: 07/31/2020 12:31   MR BRAIN WO CONTRAST  Result Date: 07/31/2020 CLINICAL DATA:  Stroke suspected. EXAM: MRI HEAD WITHOUT CONTRAST TECHNIQUE: Multiplanar, multiecho pulse sequences of the brain and surrounding structures were obtained without intravenous contrast. COMPARISON:  Head CT 07/31/2020. FINDINGS: Brain: Mild-to-moderate generalized cerebral atrophy. Commensurate prominence of the ventricles and sulci. Comparatively mild cerebellar atrophy. There are a few small scattered foci of T2/FLAIR hyperintensity within the cerebral white matter, nonspecific but compatible with minimal changes of chronic small vessel ischemia. There is no acute infarct. No evidence of an intracranial mass. No chronic intracranial blood products. No extra-axial fluid collection. No midline shift. Vascular: Expected proximal arterial flow voids. Skull and upper cervical spine: No focal marrow lesion Sinuses/Orbits: Visualized orbits show no acute finding. Left lens replacement. IMPRESSION: No evidence of acute intracranial abnormality. Minimal chronic small-vessel ischemic changes within the cerebral white matter. Mild-to-moderate generalized cerebral atrophy. Comparatively mild cerebellar atrophy. Electronically Signed   By: Kellie Simmering DO   On: 07/31/2020 19:28   DG Chest Portable 1 View  Result Date: 07/31/2020 CLINICAL DATA:  Shortness of breath.  Lethargy and weakness. EXAM: PORTABLE CHEST 1 VIEW COMPARISON:  07/11/2020 FINDINGS: Small to moderate-sized left pleural effusion, decreased. Patchy and linear density in the aerated portion of the left mid and lower lung zone. Minimal linear  atelectasis or scarring at the right lung base. No visible right pleural fluid. Grossly stable borderline enlarged cardiac silhouette and tortuous calcified thoracic aorta. Thoracic spine degenerative changes. IMPRESSION: Decreased left pleural fluid with persistent atelectasis/pneumonia in the left mid and lower lung zones. Electronically Signed   By: Claudie Revering M.D.   On: 07/31/2020 13:38    Assessment/Plan  1. Hematuria, unspecified type -    had hematuria yesterday, none today -  awaiting urine culture result  2. Slow transit constipation    Start senna S 8.6/50 mg 2 tabs twice a day and MiraLAX 17 g daily  3. AKI (acute kidney injury) [facility]) Lab Results  Component Value Date   NA 135 08/10/2020   K 4.4 08/10/2020   CO2 24 08/10/2020   GLUCOSE 106 (H) 08/10/2020   BUN 19 08/10/2020   CREATININE 1.35 (H) 08/10/2020   CALCIUM 8.7 (L) 08/10/2020   GFRNONAA 51 (L) 08/10/2020   GFRAA 58 (L) 01/23/2016   -  will re-check BMP      Family/ staff Communication:   Discussed plan of care with resident and charge nurse.  Labs/tests ordered: CBC and BMP  Goals of care:   Short-term care   Durenda Age, DNP, MSN, FNP-BC [facility] [phone] (Monday-Friday 8:00 a.m. - 5:00 p.m.) [phone] (after hours)

## 2020-08-19 ENCOUNTER — Encounter: Payer: Self-pay | Admitting: Internal Medicine

## 2020-08-19 DIAGNOSIS — R4189 Other symptoms and signs involving cognitive functions and awareness: Secondary | ICD-10-CM | POA: Insufficient documentation

## 2020-08-19 DIAGNOSIS — R29818 Other symptoms and signs involving the nervous system: Secondary | ICD-10-CM | POA: Insufficient documentation

## 2020-08-21 ENCOUNTER — Other Ambulatory Visit: Payer: Self-pay | Admitting: Family Medicine

## 2020-08-24 ENCOUNTER — Encounter: Payer: Self-pay | Admitting: Adult Health

## 2020-09-01 DIAGNOSIS — Z79899 Other long term (current) drug therapy: Secondary | ICD-10-CM | POA: Diagnosis not present

## 2020-09-01 DIAGNOSIS — R4189 Other symptoms and signs involving cognitive functions and awareness: Secondary | ICD-10-CM | POA: Diagnosis not present

## 2020-09-01 DIAGNOSIS — F323 Major depressive disorder, single episode, severe with psychotic features: Secondary | ICD-10-CM | POA: Diagnosis not present

## 2020-09-02 DIAGNOSIS — R1310 Dysphagia, unspecified: Secondary | ICD-10-CM | POA: Diagnosis not present

## 2020-09-02 DIAGNOSIS — M6281 Muscle weakness (generalized): Secondary | ICD-10-CM | POA: Diagnosis not present

## 2020-09-02 DIAGNOSIS — I5032 Chronic diastolic (congestive) heart failure: Secondary | ICD-10-CM | POA: Diagnosis not present

## 2020-09-02 DIAGNOSIS — N179 Acute kidney failure, unspecified: Secondary | ICD-10-CM | POA: Diagnosis not present

## 2020-09-02 DIAGNOSIS — R1311 Dysphagia, oral phase: Secondary | ICD-10-CM | POA: Diagnosis not present

## 2020-09-02 DIAGNOSIS — I480 Paroxysmal atrial fibrillation: Secondary | ICD-10-CM | POA: Diagnosis not present

## 2020-09-02 DIAGNOSIS — E785 Hyperlipidemia, unspecified: Secondary | ICD-10-CM | POA: Diagnosis not present

## 2020-09-02 DIAGNOSIS — M109 Gout, unspecified: Secondary | ICD-10-CM | POA: Diagnosis not present

## 2020-09-02 DIAGNOSIS — R2681 Unsteadiness on feet: Secondary | ICD-10-CM | POA: Diagnosis not present

## 2020-09-02 DIAGNOSIS — R279 Unspecified lack of coordination: Secondary | ICD-10-CM | POA: Diagnosis not present

## 2020-09-09 DIAGNOSIS — R1311 Dysphagia, oral phase: Secondary | ICD-10-CM | POA: Diagnosis not present

## 2020-09-09 DIAGNOSIS — N179 Acute kidney failure, unspecified: Secondary | ICD-10-CM | POA: Diagnosis not present

## 2020-09-09 DIAGNOSIS — R279 Unspecified lack of coordination: Secondary | ICD-10-CM | POA: Diagnosis not present

## 2020-09-09 DIAGNOSIS — R1310 Dysphagia, unspecified: Secondary | ICD-10-CM | POA: Diagnosis not present

## 2020-09-09 DIAGNOSIS — M6281 Muscle weakness (generalized): Secondary | ICD-10-CM | POA: Diagnosis not present

## 2020-09-09 DIAGNOSIS — R2681 Unsteadiness on feet: Secondary | ICD-10-CM | POA: Diagnosis not present

## 2020-09-10 ENCOUNTER — Other Ambulatory Visit: Payer: Self-pay | Admitting: Family Medicine

## 2020-09-14 DIAGNOSIS — N179 Acute kidney failure, unspecified: Secondary | ICD-10-CM | POA: Diagnosis not present

## 2020-09-14 DIAGNOSIS — R1311 Dysphagia, oral phase: Secondary | ICD-10-CM | POA: Diagnosis not present

## 2020-09-14 DIAGNOSIS — R2681 Unsteadiness on feet: Secondary | ICD-10-CM | POA: Diagnosis not present

## 2020-09-14 DIAGNOSIS — R1310 Dysphagia, unspecified: Secondary | ICD-10-CM | POA: Diagnosis not present

## 2020-09-14 DIAGNOSIS — R279 Unspecified lack of coordination: Secondary | ICD-10-CM | POA: Diagnosis not present

## 2020-09-14 DIAGNOSIS — M6281 Muscle weakness (generalized): Secondary | ICD-10-CM | POA: Diagnosis not present

## 2020-09-15 DIAGNOSIS — R2681 Unsteadiness on feet: Secondary | ICD-10-CM | POA: Diagnosis not present

## 2020-09-15 DIAGNOSIS — M6281 Muscle weakness (generalized): Secondary | ICD-10-CM | POA: Diagnosis not present

## 2020-09-15 DIAGNOSIS — F323 Major depressive disorder, single episode, severe with psychotic features: Secondary | ICD-10-CM | POA: Diagnosis not present

## 2020-09-15 DIAGNOSIS — R279 Unspecified lack of coordination: Secondary | ICD-10-CM | POA: Diagnosis not present

## 2020-09-15 DIAGNOSIS — N179 Acute kidney failure, unspecified: Secondary | ICD-10-CM | POA: Diagnosis not present

## 2020-09-15 DIAGNOSIS — R1310 Dysphagia, unspecified: Secondary | ICD-10-CM | POA: Diagnosis not present

## 2020-09-15 DIAGNOSIS — R1311 Dysphagia, oral phase: Secondary | ICD-10-CM | POA: Diagnosis not present

## 2020-09-16 DIAGNOSIS — R279 Unspecified lack of coordination: Secondary | ICD-10-CM | POA: Diagnosis not present

## 2020-09-16 DIAGNOSIS — R2681 Unsteadiness on feet: Secondary | ICD-10-CM | POA: Diagnosis not present

## 2020-09-16 DIAGNOSIS — R1310 Dysphagia, unspecified: Secondary | ICD-10-CM | POA: Diagnosis not present

## 2020-09-16 DIAGNOSIS — R1311 Dysphagia, oral phase: Secondary | ICD-10-CM | POA: Diagnosis not present

## 2020-09-16 DIAGNOSIS — M6281 Muscle weakness (generalized): Secondary | ICD-10-CM | POA: Diagnosis not present

## 2020-09-16 DIAGNOSIS — N179 Acute kidney failure, unspecified: Secondary | ICD-10-CM | POA: Diagnosis not present

## 2020-09-17 DIAGNOSIS — R1311 Dysphagia, oral phase: Secondary | ICD-10-CM | POA: Diagnosis not present

## 2020-09-17 DIAGNOSIS — R2681 Unsteadiness on feet: Secondary | ICD-10-CM | POA: Diagnosis not present

## 2020-09-17 DIAGNOSIS — R1310 Dysphagia, unspecified: Secondary | ICD-10-CM | POA: Diagnosis not present

## 2020-09-17 DIAGNOSIS — R279 Unspecified lack of coordination: Secondary | ICD-10-CM | POA: Diagnosis not present

## 2020-09-17 DIAGNOSIS — N179 Acute kidney failure, unspecified: Secondary | ICD-10-CM | POA: Diagnosis not present

## 2020-09-17 DIAGNOSIS — Z79899 Other long term (current) drug therapy: Secondary | ICD-10-CM | POA: Diagnosis not present

## 2020-09-17 DIAGNOSIS — M6281 Muscle weakness (generalized): Secondary | ICD-10-CM | POA: Diagnosis not present

## 2020-09-20 DIAGNOSIS — R1311 Dysphagia, oral phase: Secondary | ICD-10-CM | POA: Diagnosis not present

## 2020-09-20 DIAGNOSIS — M6281 Muscle weakness (generalized): Secondary | ICD-10-CM | POA: Diagnosis not present

## 2020-09-20 DIAGNOSIS — R2681 Unsteadiness on feet: Secondary | ICD-10-CM | POA: Diagnosis not present

## 2020-09-20 DIAGNOSIS — R279 Unspecified lack of coordination: Secondary | ICD-10-CM | POA: Diagnosis not present

## 2020-09-20 DIAGNOSIS — R1310 Dysphagia, unspecified: Secondary | ICD-10-CM | POA: Diagnosis not present

## 2020-09-20 DIAGNOSIS — N179 Acute kidney failure, unspecified: Secondary | ICD-10-CM | POA: Diagnosis not present

## 2020-09-22 DIAGNOSIS — R1310 Dysphagia, unspecified: Secondary | ICD-10-CM | POA: Diagnosis not present

## 2020-09-22 DIAGNOSIS — R2681 Unsteadiness on feet: Secondary | ICD-10-CM | POA: Diagnosis not present

## 2020-09-22 DIAGNOSIS — R1311 Dysphagia, oral phase: Secondary | ICD-10-CM | POA: Diagnosis not present

## 2020-09-22 DIAGNOSIS — N179 Acute kidney failure, unspecified: Secondary | ICD-10-CM | POA: Diagnosis not present

## 2020-09-22 DIAGNOSIS — M6281 Muscle weakness (generalized): Secondary | ICD-10-CM | POA: Diagnosis not present

## 2020-09-22 DIAGNOSIS — R279 Unspecified lack of coordination: Secondary | ICD-10-CM | POA: Diagnosis not present

## 2020-09-24 DIAGNOSIS — R1311 Dysphagia, oral phase: Secondary | ICD-10-CM | POA: Diagnosis not present

## 2020-09-24 DIAGNOSIS — R2681 Unsteadiness on feet: Secondary | ICD-10-CM | POA: Diagnosis not present

## 2020-09-24 DIAGNOSIS — N179 Acute kidney failure, unspecified: Secondary | ICD-10-CM | POA: Diagnosis not present

## 2020-09-24 DIAGNOSIS — R279 Unspecified lack of coordination: Secondary | ICD-10-CM | POA: Diagnosis not present

## 2020-09-24 DIAGNOSIS — R1310 Dysphagia, unspecified: Secondary | ICD-10-CM | POA: Diagnosis not present

## 2020-09-24 DIAGNOSIS — M6281 Muscle weakness (generalized): Secondary | ICD-10-CM | POA: Diagnosis not present

## 2020-10-02 DEATH — deceased

## 2020-10-15 ENCOUNTER — Ambulatory Visit: Payer: Medicare HMO | Admitting: Interventional Cardiology

## 2020-12-14 ENCOUNTER — Ambulatory Visit: Payer: Medicare HMO | Admitting: Family Medicine

## 2022-01-07 IMAGING — CR DG CHEST 2V
2 series · 2 of 2 positions shown · non-contrast
Comparison: CT abdomen January 21, 2016.

CLINICAL DATA: Four weeks of lower extremity swelling and edema.
Shortness of breath on exertion.

EXAM:
CHEST - 2 VIEW

[w chest pa]
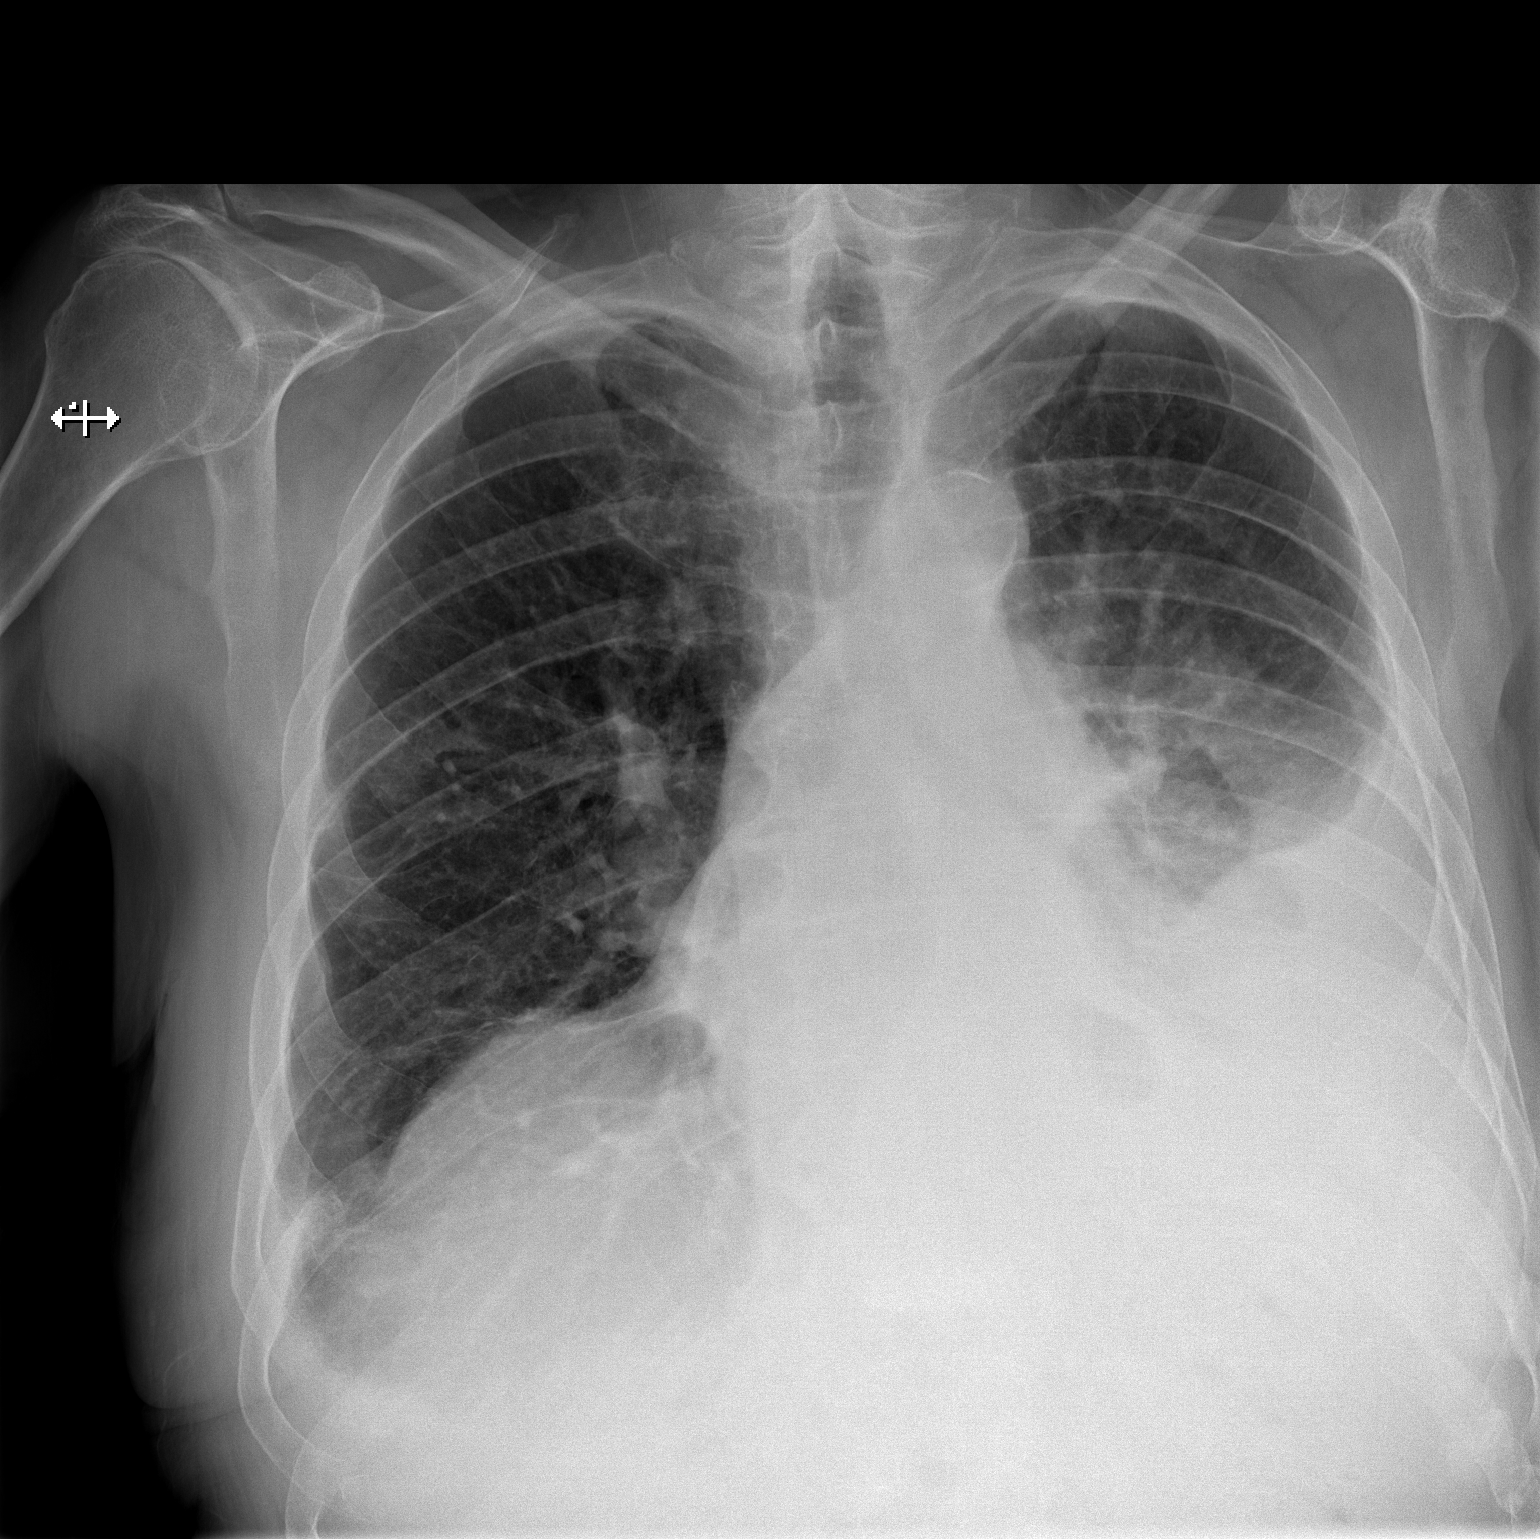

[w chest lat]
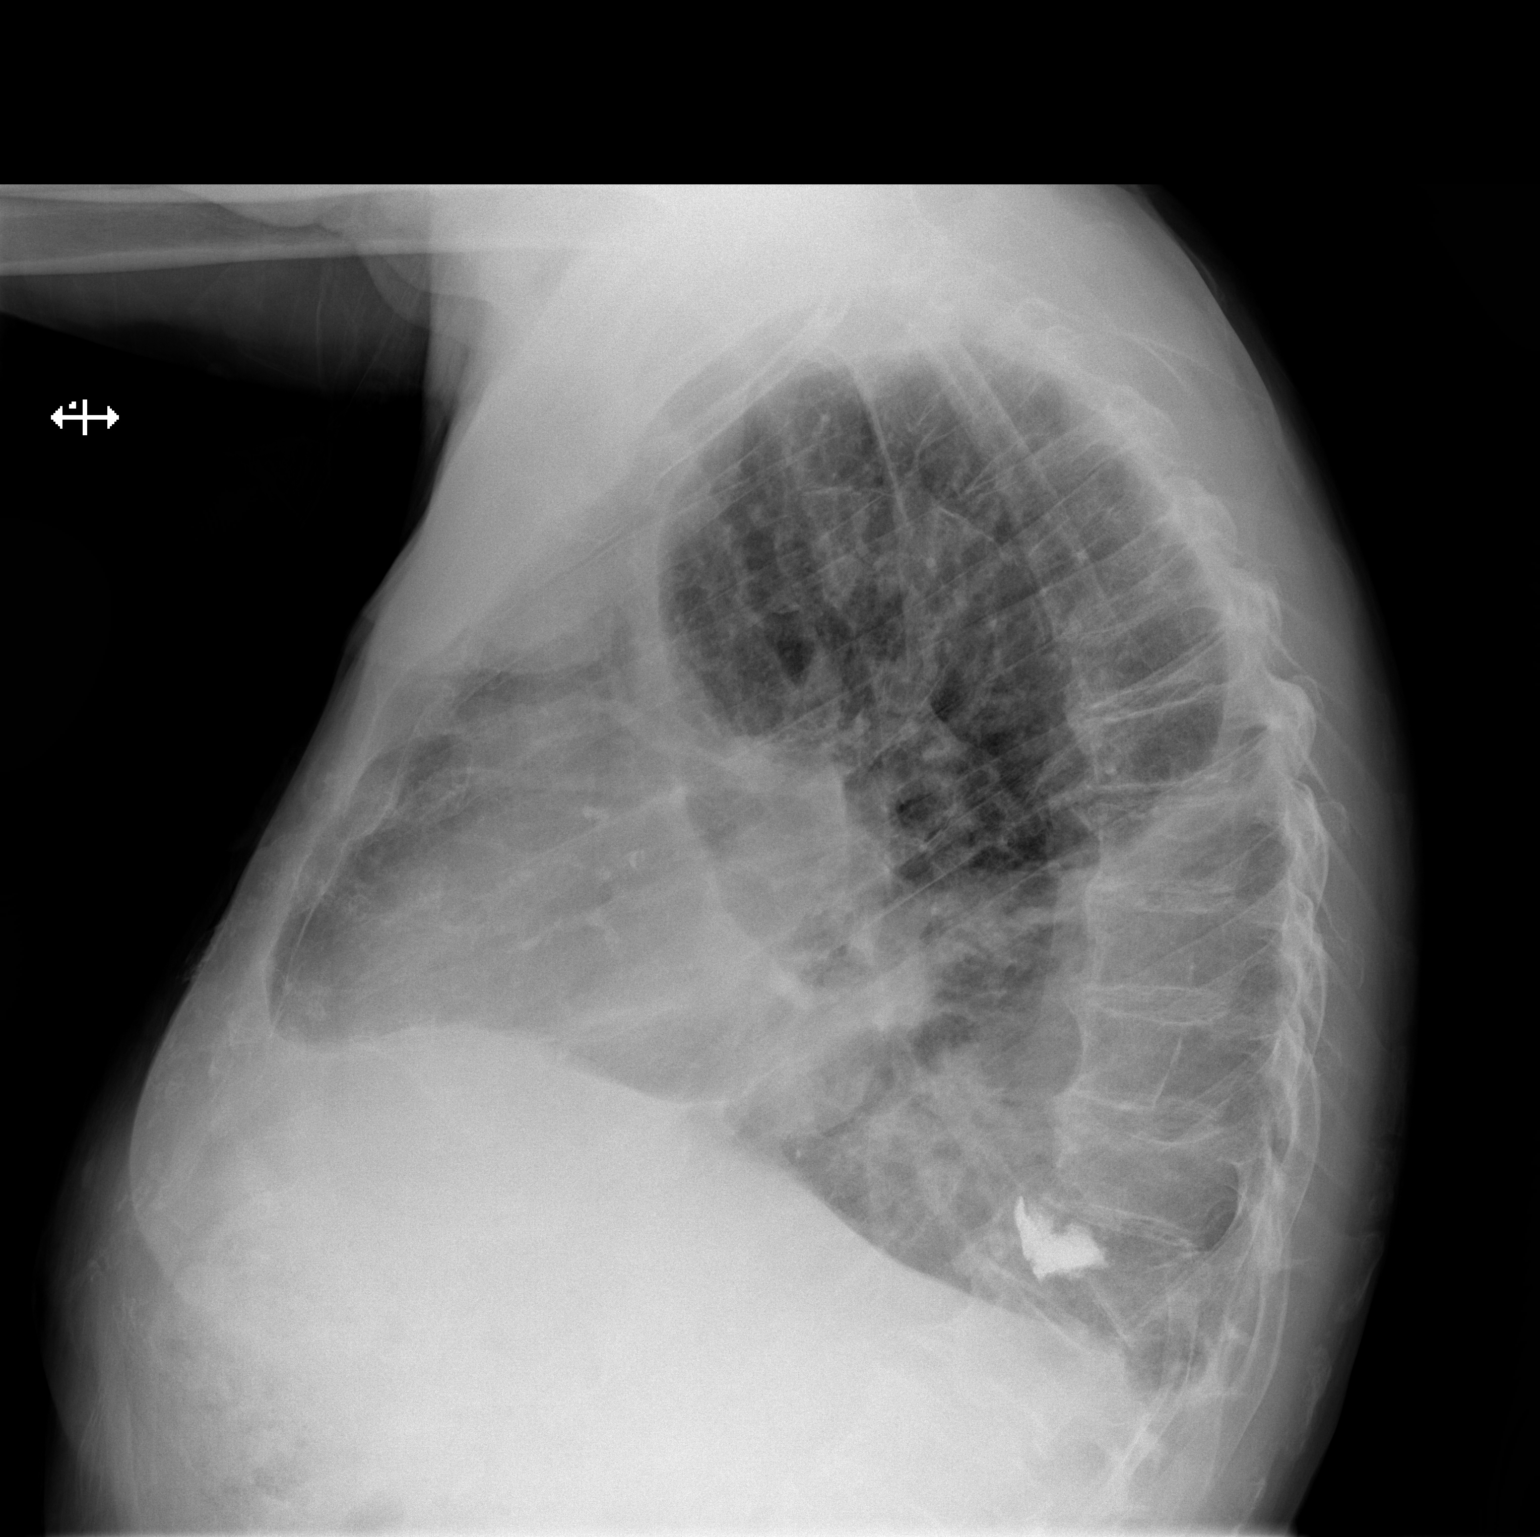

[2 of 2 positions shown; findings below may reference images not displayed]

FINDINGS: The heart size and mediastinal contours are partially obscured.
Aortic atherosclerosis. Moderate hiatal hernia. Moderate left
pleural effusion with adjacent parenchymal consolidation versus
atelectasis. Right lung is predominantly clear. Prior lower thoracic
vertebral augmentation.
IMPRESSION: Moderate left pleural effusion with adjacent parenchymal
consolidation versus atelectasis.

## 2022-02-04 IMAGING — CT CT HEAD W/O CM
3 of 4 series · 15 of 47 positions shown, 18 images · non-contrast
Comparison: 07/31/2020

CLINICAL DATA: Nystagmus

EXAM:
CT HEAD WITHOUT CONTRAST
TECHNIQUE: Contiguous axial images were obtained from the base of the skull
through the vertex without intravenous contrast.

[Series 3: head 2.0 mpr ax · axial · 0.49mm/px · z∈[-137,-2]mm · 9 of 92 slices shown, 12 images]
[im 10/92  brain]
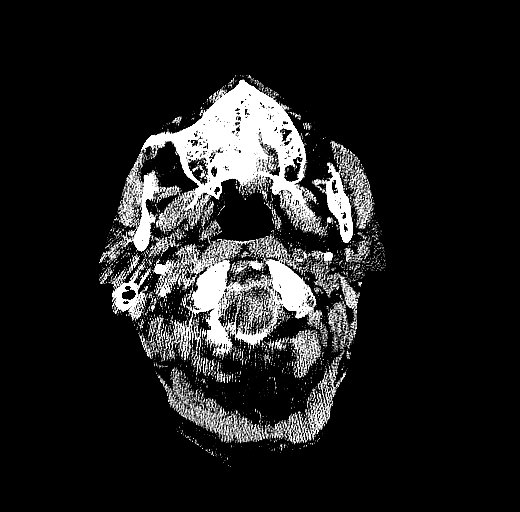
[im 10/92  bone]
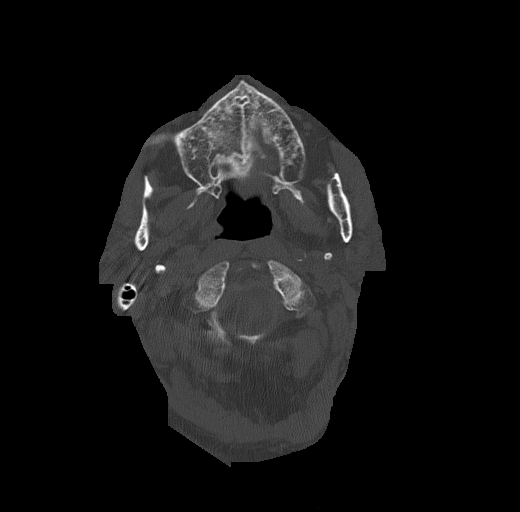
[im 19/92  brain]
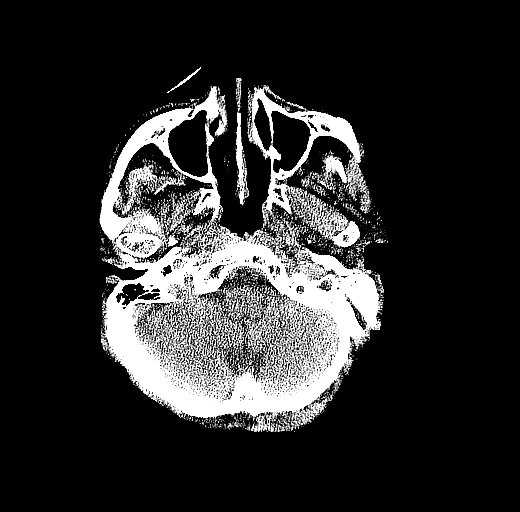
[im 28/92  brain]
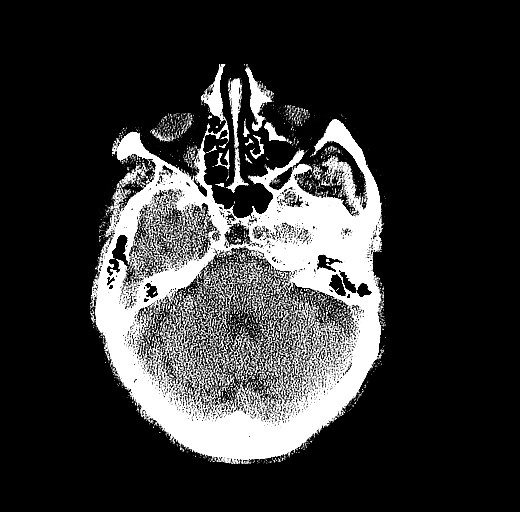
[im 37/92  brain]
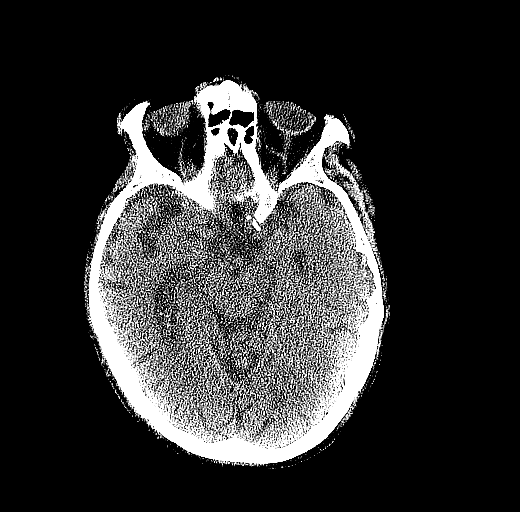
[im 46/92  brain]
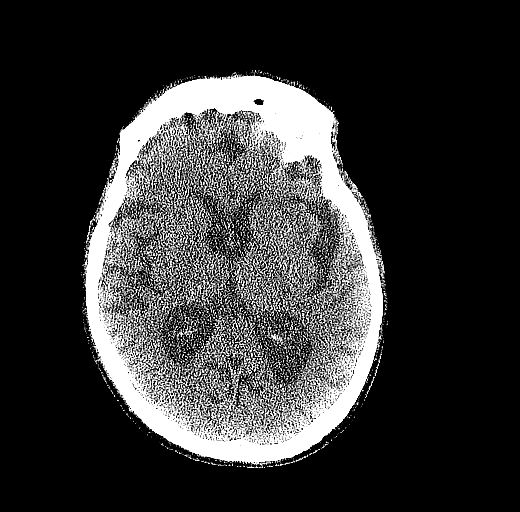
[im 46/92  bone]
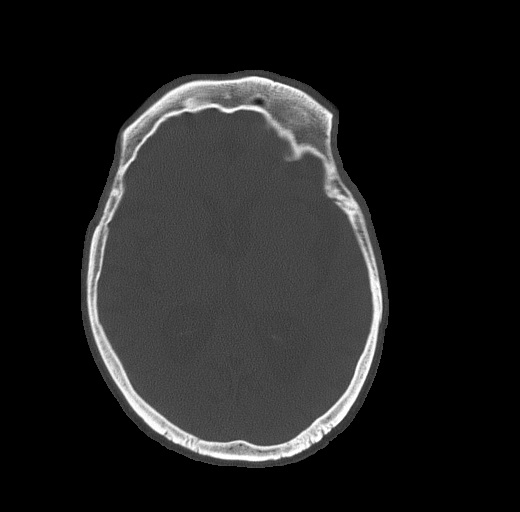
[im 55/92  brain]
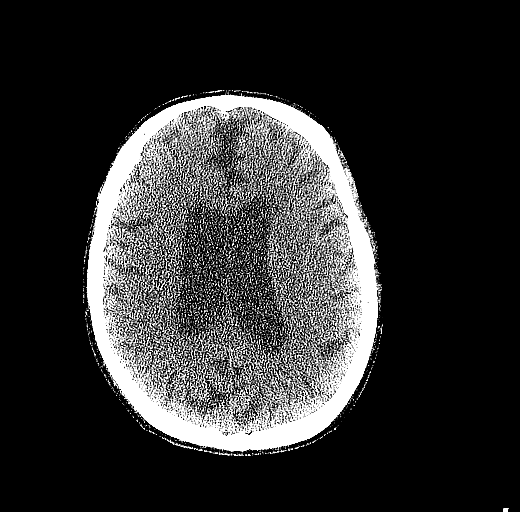
[im 64/92  brain]
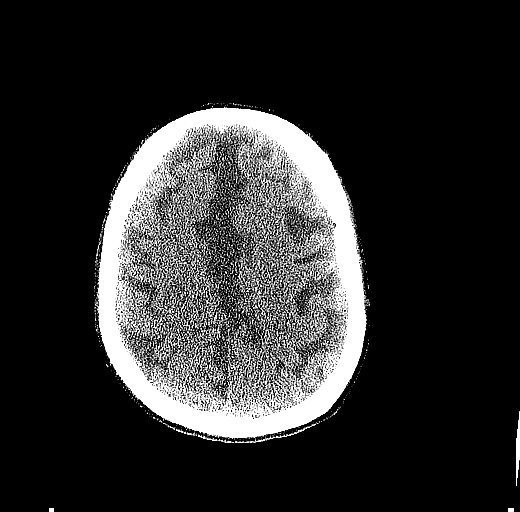
[im 73/92  brain]
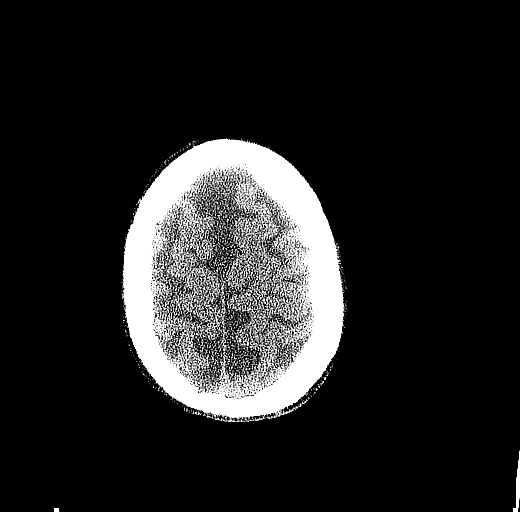
[im 82/92  brain]
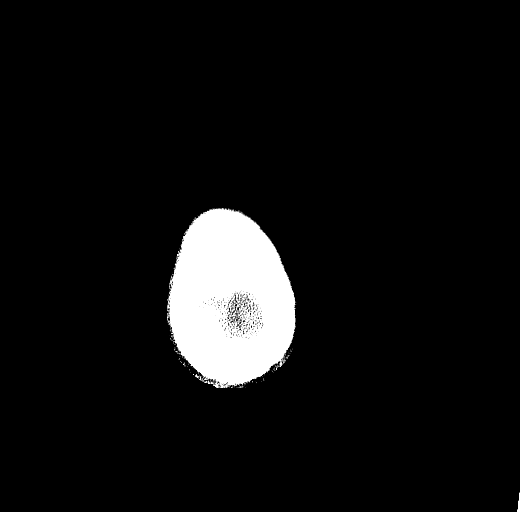
[im 82/92  bone]
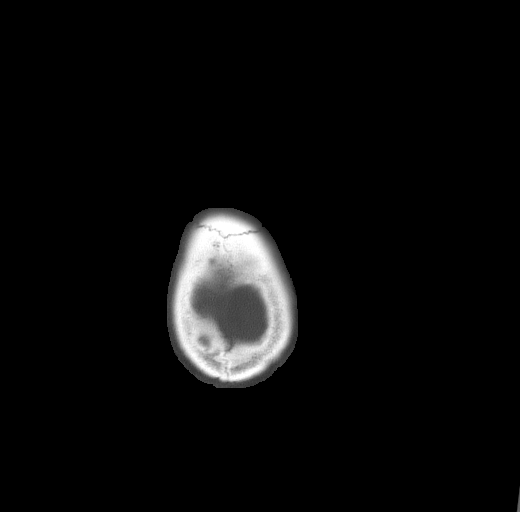

[Series 5: head 3.0 mpr cor · coronal · 0.35mm/px · 3 of 73 slices shown]
[im 25/73  brain]
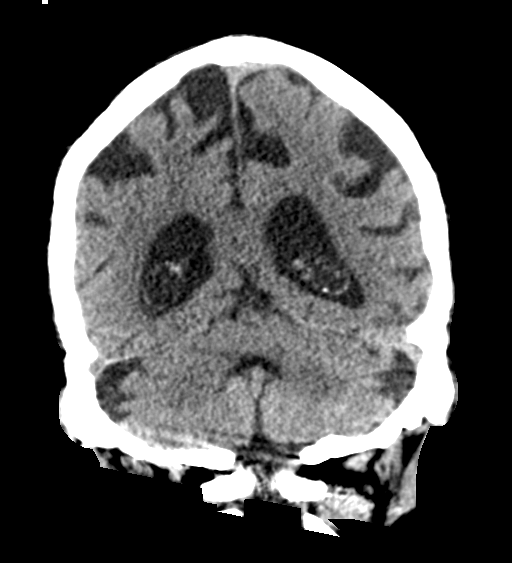
[im 33/73  brain]
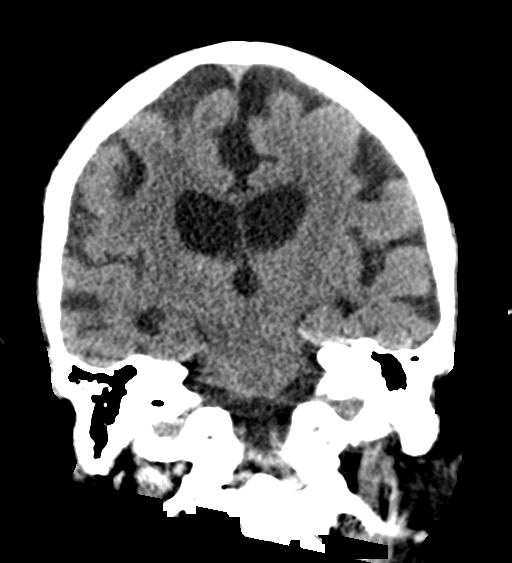
[im 41/73  brain]
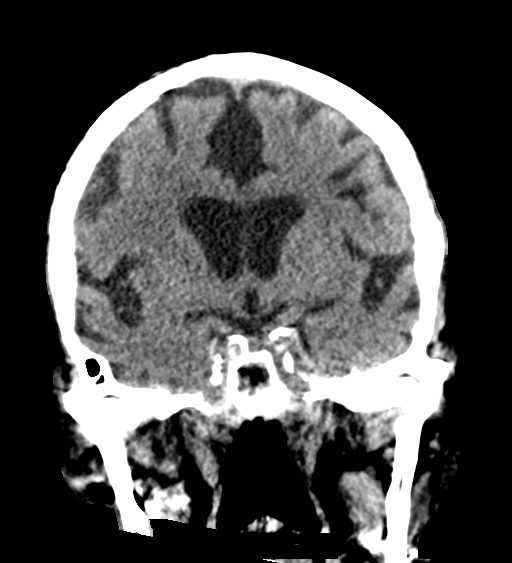

[Series 6: head 3.0 mpr sag · sagittal · 0.37mm/px · 3 of 58 slices shown]
[im 25/58  brain]
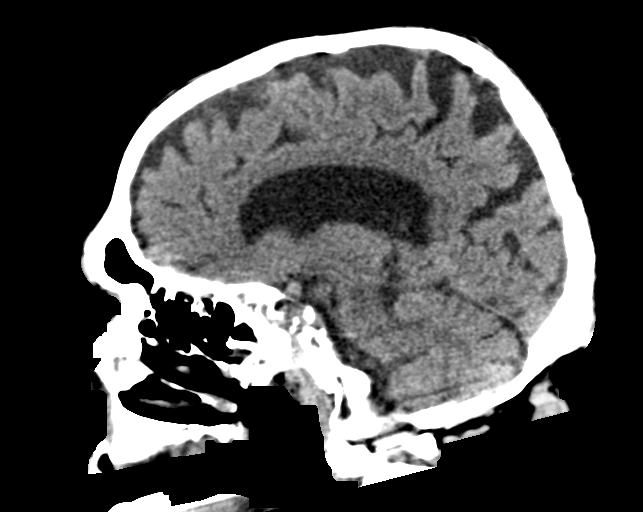
[im 29/58  brain]
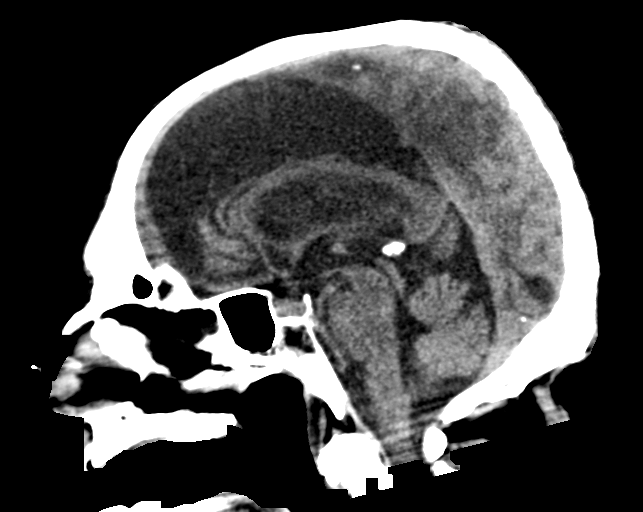
[im 33/58  brain]
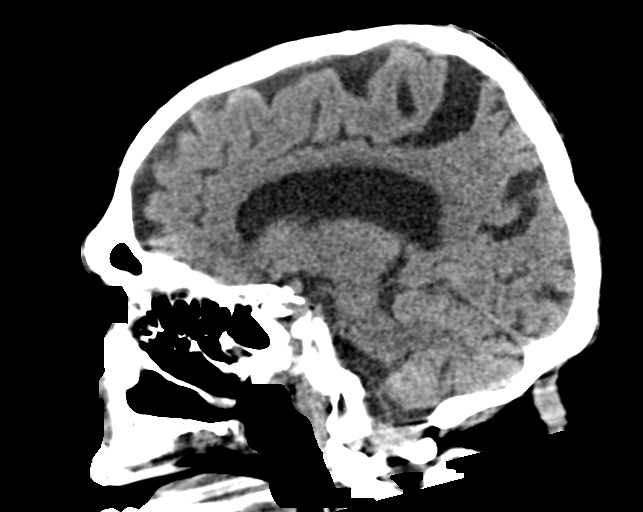

[15 of 47 positions shown; findings below may reference images not displayed]

FINDINGS: Brain: Normal anatomic configuration. Moderate parenchymal volume
loss is commensurate with the patient's age. Mild periventricular
white matter changes are present likely reflecting the sequela of
small vessel ischemia. No abnormal intra or extra-axial mass lesion
or fluid collection. No abnormal mass effect or midline shift. No
evidence of acute intracranial hemorrhage or infarct. Borderline
ventriculomegaly is commensurate with the degree of parenchymal
volume loss and likely reflects ex vacuo dilation. Cerebellum
unremarkable.

Vascular: No asymmetric hyperdense vasculature at the skull base.
Extensive atherosclerotic calcification within the carotid siphons.

Skull: Intact

Sinuses/Orbits: Paranasal sinuses are clear. Orbits are
unremarkable.

Other: Mastoid air cells and middle ear cavities are clear.
IMPRESSION: No acute intracranial abnormality.  Stable senescent change.
